# Patient Record
Sex: Female | Born: 1970 | Race: Black or African American | Hispanic: No | Marital: Married | Smoking: Former smoker
Health system: Southern US, Community
[De-identification: ages and names within clinical notes are randomized; demographics above are authoritative.]

## PROBLEM LIST (undated history)

## (undated) DIAGNOSIS — I10 Essential (primary) hypertension: Secondary | ICD-10-CM

## (undated) DIAGNOSIS — M545 Low back pain, unspecified: Secondary | ICD-10-CM

## (undated) DIAGNOSIS — F32A Depression, unspecified: Secondary | ICD-10-CM

## (undated) DIAGNOSIS — R51 Headache: Secondary | ICD-10-CM

## (undated) DIAGNOSIS — T7840XA Allergy, unspecified, initial encounter: Secondary | ICD-10-CM

## (undated) DIAGNOSIS — R87619 Unspecified abnormal cytological findings in specimens from cervix uteri: Secondary | ICD-10-CM

## (undated) DIAGNOSIS — K219 Gastro-esophageal reflux disease without esophagitis: Secondary | ICD-10-CM

## (undated) DIAGNOSIS — N390 Urinary tract infection, site not specified: Secondary | ICD-10-CM

## (undated) DIAGNOSIS — F329 Major depressive disorder, single episode, unspecified: Secondary | ICD-10-CM

## (undated) DIAGNOSIS — G8929 Other chronic pain: Secondary | ICD-10-CM

## (undated) DIAGNOSIS — R519 Headache, unspecified: Secondary | ICD-10-CM

## (undated) DIAGNOSIS — M1990 Unspecified osteoarthritis, unspecified site: Secondary | ICD-10-CM

## (undated) DIAGNOSIS — E119 Type 2 diabetes mellitus without complications: Secondary | ICD-10-CM

## (undated) HISTORY — DX: Gastro-esophageal reflux disease without esophagitis: K21.9

## (undated) HISTORY — DX: Type 2 diabetes mellitus without complications: E11.9

## (undated) HISTORY — PX: CERVICAL BIOPSY  W/ LOOP ELECTRODE EXCISION: SUR135

## (undated) HISTORY — DX: Major depressive disorder, single episode, unspecified: F32.9

## (undated) HISTORY — DX: Headache: R51

## (undated) HISTORY — PX: COLPOSCOPY: SHX161

## (undated) HISTORY — DX: Unspecified abnormal cytological findings in specimens from cervix uteri: R87.619

## (undated) HISTORY — DX: Urinary tract infection, site not specified: N39.0

## (undated) HISTORY — DX: Low back pain, unspecified: M54.50

## (undated) HISTORY — DX: Unspecified osteoarthritis, unspecified site: M19.90

## (undated) HISTORY — DX: Other chronic pain: G89.29

## (undated) HISTORY — DX: Low back pain: M54.5

## (undated) HISTORY — PX: TUBAL LIGATION: SHX77

## (undated) HISTORY — DX: Allergy, unspecified, initial encounter: T78.40XA

## (undated) HISTORY — DX: Depression, unspecified: F32.A

## (undated) HISTORY — DX: Headache, unspecified: R51.9

## (undated) HISTORY — DX: Essential (primary) hypertension: I10

---

## 1998-06-28 ENCOUNTER — Emergency Department (HOSPITAL_COMMUNITY): Admission: EM | Admit: 1998-06-28 | Discharge: 1998-06-29 | Payer: Self-pay | Admitting: Emergency Medicine

## 1999-04-19 ENCOUNTER — Other Ambulatory Visit: Admission: RE | Admit: 1999-04-19 | Discharge: 1999-04-19 | Payer: Self-pay | Admitting: Internal Medicine

## 2000-05-25 ENCOUNTER — Other Ambulatory Visit: Admission: RE | Admit: 2000-05-25 | Discharge: 2000-05-25 | Payer: Self-pay | Admitting: Obstetrics and Gynecology

## 2000-12-09 ENCOUNTER — Encounter: Payer: Self-pay | Admitting: Emergency Medicine

## 2000-12-09 ENCOUNTER — Emergency Department (HOSPITAL_COMMUNITY): Admission: EM | Admit: 2000-12-09 | Discharge: 2000-12-09 | Payer: Self-pay | Admitting: Emergency Medicine

## 2001-06-04 ENCOUNTER — Other Ambulatory Visit: Admission: RE | Admit: 2001-06-04 | Discharge: 2001-06-04 | Payer: Self-pay | Admitting: Obstetrics and Gynecology

## 2003-09-18 ENCOUNTER — Encounter: Admission: RE | Admit: 2003-09-18 | Discharge: 2003-09-18 | Payer: Self-pay | Admitting: Obstetrics and Gynecology

## 2004-03-16 ENCOUNTER — Ambulatory Visit: Payer: Self-pay | Admitting: Internal Medicine

## 2004-03-26 ENCOUNTER — Ambulatory Visit: Payer: Self-pay | Admitting: Internal Medicine

## 2004-05-31 ENCOUNTER — Other Ambulatory Visit: Admission: RE | Admit: 2004-05-31 | Discharge: 2004-05-31 | Payer: Self-pay | Admitting: Internal Medicine

## 2004-05-31 ENCOUNTER — Ambulatory Visit: Payer: Self-pay | Admitting: Internal Medicine

## 2004-09-13 ENCOUNTER — Ambulatory Visit: Payer: Self-pay | Admitting: Internal Medicine

## 2004-09-26 ENCOUNTER — Encounter: Admission: RE | Admit: 2004-09-26 | Discharge: 2004-09-26 | Payer: Self-pay | Admitting: Internal Medicine

## 2005-04-18 ENCOUNTER — Ambulatory Visit: Payer: Self-pay | Admitting: Internal Medicine

## 2005-09-08 ENCOUNTER — Ambulatory Visit: Payer: Self-pay | Admitting: Internal Medicine

## 2005-09-15 ENCOUNTER — Other Ambulatory Visit: Admission: RE | Admit: 2005-09-15 | Discharge: 2005-09-15 | Payer: Self-pay | Admitting: Internal Medicine

## 2005-09-15 ENCOUNTER — Encounter: Payer: Self-pay | Admitting: Internal Medicine

## 2005-09-15 ENCOUNTER — Ambulatory Visit: Payer: Self-pay | Admitting: Internal Medicine

## 2006-01-25 ENCOUNTER — Emergency Department (HOSPITAL_COMMUNITY): Admission: EM | Admit: 2006-01-25 | Discharge: 2006-01-25 | Payer: Self-pay | Admitting: Family Medicine

## 2006-09-06 DIAGNOSIS — J309 Allergic rhinitis, unspecified: Secondary | ICD-10-CM | POA: Insufficient documentation

## 2006-09-06 DIAGNOSIS — F329 Major depressive disorder, single episode, unspecified: Secondary | ICD-10-CM

## 2006-09-06 DIAGNOSIS — F32A Depression, unspecified: Secondary | ICD-10-CM | POA: Insufficient documentation

## 2006-09-06 DIAGNOSIS — K219 Gastro-esophageal reflux disease without esophagitis: Secondary | ICD-10-CM | POA: Insufficient documentation

## 2006-09-19 ENCOUNTER — Encounter: Payer: Self-pay | Admitting: Internal Medicine

## 2006-09-19 ENCOUNTER — Other Ambulatory Visit: Admission: RE | Admit: 2006-09-19 | Discharge: 2006-09-19 | Payer: Self-pay | Admitting: Internal Medicine

## 2006-09-19 ENCOUNTER — Ambulatory Visit: Payer: Self-pay | Admitting: Internal Medicine

## 2006-09-19 DIAGNOSIS — M1990 Unspecified osteoarthritis, unspecified site: Secondary | ICD-10-CM | POA: Insufficient documentation

## 2006-09-19 DIAGNOSIS — M545 Low back pain, unspecified: Secondary | ICD-10-CM | POA: Insufficient documentation

## 2006-09-19 DIAGNOSIS — R1032 Left lower quadrant pain: Secondary | ICD-10-CM | POA: Insufficient documentation

## 2006-09-19 LAB — CONVERTED CEMR LAB
ALT: 16 units/L (ref 0–35)
AST: 14 units/L (ref 0–37)
Albumin: 3.7 g/dL (ref 3.5–5.2)
Alkaline Phosphatase: 58 units/L (ref 39–117)
BUN: 8 mg/dL (ref 6–23)
Basophils Absolute: 0.1 10*3/uL (ref 0.0–0.1)
Basophils Relative: 0.6 % (ref 0.0–1.0)
Bilirubin, Direct: 0.1 mg/dL (ref 0.0–0.3)
CO2: 26 meq/L (ref 19–32)
Calcium: 9.4 mg/dL (ref 8.4–10.5)
Chloride: 105 meq/L (ref 96–112)
Cholesterol: 122 mg/dL (ref 0–200)
Creatinine, Ser: 0.9 mg/dL (ref 0.4–1.2)
Eosinophils Absolute: 0.2 10*3/uL (ref 0.0–0.6)
Eosinophils Relative: 2.6 % (ref 0.0–5.0)
GFR calc Af Amer: 90 mL/min
GFR calc non Af Amer: 74 mL/min
Glucose, Bld: 74 mg/dL (ref 70–99)
HCT: 44.2 % (ref 36.0–46.0)
HDL: 28.5 mg/dL — ABNORMAL LOW (ref >39.0)
Hemoglobin: 15.1 g/dL — ABNORMAL HIGH (ref 12.0–15.0)
LDL Cholesterol: 79 mg/dL (ref 0–99)
Lymphocytes Relative: 34.8 % (ref 12.0–46.0)
MCHC: 34 g/dL (ref 30.0–36.0)
MCV: 97.4 fL (ref 78.0–100.0)
Monocytes Absolute: 0.5 10*3/uL (ref 0.2–0.7)
Monocytes Relative: 5.8 % (ref 3.0–11.0)
Neutro Abs: 5.2 10*3/uL (ref 1.4–7.7)
Neutrophils Relative %: 56.2 % (ref 43.0–77.0)
Platelets: 249 10*3/uL (ref 150–400)
Potassium: 3.9 meq/L (ref 3.5–5.1)
RBC: 4.54 M/uL (ref 3.87–5.11)
RDW: 12.5 % (ref 11.5–14.6)
Sodium: 138 meq/L (ref 135–145)
TSH: 1.6 microintl units/mL (ref 0.35–5.50)
Total Bilirubin: 1.4 mg/dL — ABNORMAL HIGH (ref 0.3–1.2)
Total CHOL/HDL Ratio: 4.3
Total Protein: 6.7 g/dL (ref 6.0–8.3)
Triglycerides: 75 mg/dL (ref 0–149)
VLDL: 15 mg/dL (ref 0–40)
WBC: 9.2 10*3/uL (ref 4.5–10.5)
hCG, Beta Chain, Quant, S: <0.5 milliintl units/mL

## 2006-09-22 ENCOUNTER — Encounter: Admission: RE | Admit: 2006-09-22 | Discharge: 2006-09-22 | Payer: Self-pay | Admitting: Internal Medicine

## 2006-09-26 ENCOUNTER — Telehealth: Payer: Self-pay | Admitting: *Deleted

## 2006-11-17 ENCOUNTER — Telehealth: Payer: Self-pay | Admitting: Internal Medicine

## 2007-07-02 ENCOUNTER — Telehealth (INDEPENDENT_AMBULATORY_CARE_PROVIDER_SITE_OTHER): Payer: Self-pay | Admitting: *Deleted

## 2007-09-13 ENCOUNTER — Ambulatory Visit: Payer: Self-pay | Admitting: Internal Medicine

## 2007-09-13 LAB — CONVERTED CEMR LAB
ALT: 19 units/L (ref 0–35)
AST: 18 units/L (ref 0–37)
Albumin: 3.6 g/dL (ref 3.5–5.2)
Alkaline Phosphatase: 45 units/L (ref 39–117)
BUN: 8 mg/dL (ref 6–23)
Basophils Absolute: 0 10*3/uL (ref 0.0–0.1)
Basophils Relative: 0.4 % (ref 0.0–3.0)
Bilirubin Urine: NEGATIVE
Bilirubin, Direct: 0.1 mg/dL (ref 0.0–0.3)
Blood in Urine, dipstick: NEGATIVE
CO2: 26 meq/L (ref 19–32)
Calcium: 9.2 mg/dL (ref 8.4–10.5)
Chloride: 107 meq/L (ref 96–112)
Cholesterol: 123 mg/dL (ref 0–200)
Creatinine, Ser: 0.8 mg/dL (ref 0.4–1.2)
Eosinophils Absolute: 0.3 10*3/uL (ref 0.0–0.7)
Eosinophils Relative: 3.1 % (ref 0.0–5.0)
GFR calc Af Amer: 103 mL/min
GFR calc non Af Amer: 85 mL/min
Glucose, Bld: 87 mg/dL (ref 70–99)
Glucose, Urine, Semiquant: NEGATIVE
HCT: 42 % (ref 36.0–46.0)
HDL: 31 mg/dL — ABNORMAL LOW (ref >39.0)
Hemoglobin: 14.9 g/dL (ref 12.0–15.0)
Ketones, urine, test strip: NEGATIVE
LDL Cholesterol: 78 mg/dL (ref 0–99)
Lymphocytes Relative: 34.8 % (ref 12.0–46.0)
MCHC: 35.6 g/dL (ref 30.0–36.0)
MCV: 95.2 fL (ref 78.0–100.0)
Monocytes Absolute: 0.7 10*3/uL (ref 0.1–1.0)
Monocytes Relative: 6.5 % (ref 3.0–12.0)
Neutro Abs: 5.7 10*3/uL (ref 1.4–7.7)
Neutrophils Relative %: 55.2 % (ref 43.0–77.0)
Nitrite: POSITIVE
Platelets: 223 10*3/uL (ref 150–400)
Potassium: 4.4 meq/L (ref 3.5–5.1)
Protein, U semiquant: NEGATIVE
RBC: 4.41 M/uL (ref 3.87–5.11)
RDW: 13.1 % (ref 11.5–14.6)
Sodium: 139 meq/L (ref 135–145)
Specific Gravity, Urine: 1.025
TSH: 1.66 microintl units/mL (ref 0.35–5.50)
Total Bilirubin: 1 mg/dL (ref 0.3–1.2)
Total CHOL/HDL Ratio: 4
Total Protein: 6.2 g/dL (ref 6.0–8.3)
Triglycerides: 72 mg/dL (ref 0–149)
Urobilinogen, UA: 0.2
VLDL: 14 mg/dL (ref 0–40)
WBC: 10.2 10*3/uL (ref 4.5–10.5)
pH: 6

## 2007-09-20 ENCOUNTER — Ambulatory Visit: Payer: Self-pay | Admitting: Internal Medicine

## 2007-09-20 DIAGNOSIS — M722 Plantar fascial fibromatosis: Secondary | ICD-10-CM | POA: Insufficient documentation

## 2007-10-24 ENCOUNTER — Telehealth: Payer: Self-pay | Admitting: Internal Medicine

## 2007-10-30 ENCOUNTER — Telehealth: Payer: Self-pay | Admitting: Internal Medicine

## 2007-11-01 ENCOUNTER — Ambulatory Visit: Payer: Self-pay | Admitting: Internal Medicine

## 2007-11-19 ENCOUNTER — Encounter: Payer: Self-pay | Admitting: Internal Medicine

## 2008-02-04 ENCOUNTER — Telehealth: Payer: Self-pay | Admitting: Internal Medicine

## 2008-07-11 ENCOUNTER — Telehealth: Payer: Self-pay | Admitting: Internal Medicine

## 2008-07-11 DIAGNOSIS — N926 Irregular menstruation, unspecified: Secondary | ICD-10-CM | POA: Insufficient documentation

## 2008-08-11 ENCOUNTER — Ambulatory Visit: Payer: Self-pay | Admitting: Internal Medicine

## 2008-08-11 LAB — CONVERTED CEMR LAB
ALT: 20 units/L (ref 0–35)
AST: 21 units/L (ref 0–37)
Albumin: 3.7 g/dL (ref 3.5–5.2)
Alkaline Phosphatase: 64 units/L (ref 39–117)
BUN: 6 mg/dL (ref 6–23)
Basophils Absolute: 0 10*3/uL (ref 0.0–0.1)
Basophils Relative: 0.1 % (ref 0.0–3.0)
Bilirubin Urine: NEGATIVE
Bilirubin, Direct: 0.2 mg/dL (ref 0.0–0.3)
CO2: 27 meq/L (ref 19–32)
Calcium: 9.1 mg/dL (ref 8.4–10.5)
Chloride: 106 meq/L (ref 96–112)
Cholesterol: 130 mg/dL (ref 0–200)
Creatinine, Ser: 0.8 mg/dL (ref 0.4–1.2)
Eosinophils Absolute: 0.3 10*3/uL (ref 0.0–0.7)
Eosinophils Relative: 3.2 % (ref 0.0–5.0)
GFR calc non Af Amer: 102.19 mL/min (ref >60)
Glucose, Bld: 76 mg/dL (ref 70–99)
HCT: 41.9 % (ref 36.0–46.0)
HDL: 34.1 mg/dL — ABNORMAL LOW (ref >39.00)
Hemoglobin: 15 g/dL (ref 12.0–15.0)
Ketones, ur: NEGATIVE mg/dL
LDL Cholesterol: 87 mg/dL (ref 0–99)
Leukocytes, UA: NEGATIVE
Lymphocytes Relative: 32.5 % (ref 12.0–46.0)
Lymphs Abs: 3.2 10*3/uL (ref 0.7–4.0)
MCHC: 35.8 g/dL (ref 30.0–36.0)
MCV: 96.7 fL (ref 78.0–100.0)
Monocytes Absolute: 0.5 10*3/uL (ref 0.1–1.0)
Monocytes Relative: 5 % (ref 3.0–12.0)
Neutro Abs: 5.7 10*3/uL (ref 1.4–7.7)
Neutrophils Relative %: 59.2 % (ref 43.0–77.0)
Nitrite: POSITIVE
Platelets: 231 10*3/uL (ref 150.0–400.0)
Potassium: 4.1 meq/L (ref 3.5–5.1)
RBC: 4.33 M/uL (ref 3.87–5.11)
RDW: 12.7 % (ref 11.5–14.6)
Sodium: 141 meq/L (ref 135–145)
Specific Gravity, Urine: >=1.03 (ref 1.000–1.030)
TSH: 1.62 microintl units/mL (ref 0.35–5.50)
Total Bilirubin: 0.9 mg/dL (ref 0.3–1.2)
Total CHOL/HDL Ratio: 4
Total Protein: 6.5 g/dL (ref 6.0–8.3)
Triglycerides: 47 mg/dL (ref 0.0–149.0)
Urine Glucose: NEGATIVE mg/dL
Urobilinogen, UA: 0.2 (ref 0.0–1.0)
VLDL: 9.4 mg/dL (ref 0.0–40.0)
WBC: 9.7 10*3/uL (ref 4.5–10.5)
pH: 5.5 (ref 5.0–8.0)

## 2008-08-20 LAB — CONVERTED CEMR LAB: Pap Smear: NORMAL

## 2008-09-09 ENCOUNTER — Ambulatory Visit: Payer: Self-pay | Admitting: Internal Medicine

## 2008-09-09 DIAGNOSIS — B351 Tinea unguium: Secondary | ICD-10-CM | POA: Insufficient documentation

## 2008-09-10 ENCOUNTER — Emergency Department (HOSPITAL_COMMUNITY): Admission: EM | Admit: 2008-09-10 | Discharge: 2008-09-10 | Payer: Self-pay | Admitting: Emergency Medicine

## 2008-09-15 ENCOUNTER — Encounter: Admission: RE | Admit: 2008-09-15 | Discharge: 2008-09-15 | Payer: Self-pay | Admitting: Internal Medicine

## 2008-12-03 ENCOUNTER — Ambulatory Visit: Payer: Self-pay | Admitting: Internal Medicine

## 2009-06-24 ENCOUNTER — Telehealth: Payer: Self-pay | Admitting: Internal Medicine

## 2009-07-03 ENCOUNTER — Telehealth: Payer: Self-pay | Admitting: Internal Medicine

## 2009-08-06 ENCOUNTER — Emergency Department (HOSPITAL_COMMUNITY): Admission: EM | Admit: 2009-08-06 | Discharge: 2009-08-06 | Payer: Self-pay | Admitting: Family Medicine

## 2009-09-15 ENCOUNTER — Ambulatory Visit: Payer: Self-pay | Admitting: Internal Medicine

## 2009-09-15 LAB — CONVERTED CEMR LAB
ALT: 16 units/L (ref 0–35)
AST: 18 units/L (ref 0–37)
Albumin: 4 g/dL (ref 3.5–5.2)
Alkaline Phosphatase: 58 units/L (ref 39–117)
BUN: 10 mg/dL (ref 6–23)
Basophils Absolute: 0.1 10*3/uL (ref 0.0–0.1)
Basophils Relative: 0.5 % (ref 0.0–3.0)
Bilirubin Urine: NEGATIVE
Bilirubin, Direct: 0.2 mg/dL (ref 0.0–0.3)
Blood in Urine, dipstick: NEGATIVE
CO2: 27 meq/L (ref 19–32)
Calcium: 9.1 mg/dL (ref 8.4–10.5)
Chloride: 106 meq/L (ref 96–112)
Cholesterol: 141 mg/dL (ref 0–200)
Creatinine, Ser: 0.8 mg/dL (ref 0.4–1.2)
Eosinophils Absolute: 0.2 10*3/uL (ref 0.0–0.7)
Eosinophils Relative: 1.8 % (ref 0.0–5.0)
GFR calc non Af Amer: 96.07 mL/min (ref >60)
Glucose, Bld: 86 mg/dL (ref 70–99)
Glucose, Urine, Semiquant: NEGATIVE
HCT: 42.4 % (ref 36.0–46.0)
HDL: 36.4 mg/dL — ABNORMAL LOW (ref >39.00)
Hemoglobin: 14.8 g/dL (ref 12.0–15.0)
Ketones, urine, test strip: NEGATIVE
LDL Cholesterol: 89 mg/dL (ref 0–99)
Lymphocytes Relative: 31.1 % (ref 12.0–46.0)
Lymphs Abs: 3.8 10*3/uL (ref 0.7–4.0)
MCHC: 35 g/dL (ref 30.0–36.0)
MCV: 97.2 fL (ref 78.0–100.0)
Monocytes Absolute: 0.5 10*3/uL (ref 0.1–1.0)
Monocytes Relative: 4 % (ref 3.0–12.0)
Neutro Abs: 7.7 10*3/uL (ref 1.4–7.7)
Neutrophils Relative %: 62.6 % (ref 43.0–77.0)
Nitrite: NEGATIVE
Platelets: 236 10*3/uL (ref 150.0–400.0)
Potassium: 3.9 meq/L (ref 3.5–5.1)
Protein, U semiquant: NEGATIVE
RBC: 4.37 M/uL (ref 3.87–5.11)
RDW: 13.6 % (ref 11.5–14.6)
Sodium: 138 meq/L (ref 135–145)
Specific Gravity, Urine: >=1.03
TSH: 1.87 microintl units/mL (ref 0.35–5.50)
Total Bilirubin: 0.7 mg/dL (ref 0.3–1.2)
Total CHOL/HDL Ratio: 4
Total Protein: 6.6 g/dL (ref 6.0–8.3)
Triglycerides: 76 mg/dL (ref 0.0–149.0)
Urobilinogen, UA: 0.2
VLDL: 15.2 mg/dL (ref 0.0–40.0)
WBC Urine, dipstick: NEGATIVE
WBC: 12.3 10*3/uL — ABNORMAL HIGH (ref 4.5–10.5)
pH: 5.5

## 2009-09-23 ENCOUNTER — Ambulatory Visit: Payer: Self-pay | Admitting: Internal Medicine

## 2009-09-23 DIAGNOSIS — N920 Excessive and frequent menstruation with regular cycle: Secondary | ICD-10-CM | POA: Insufficient documentation

## 2009-10-01 ENCOUNTER — Encounter: Admission: RE | Admit: 2009-10-01 | Discharge: 2009-10-01 | Payer: Self-pay | Admitting: Internal Medicine

## 2009-10-01 LAB — HM MAMMOGRAPHY

## 2010-03-23 NOTE — Progress Notes (Signed)
----   Converted from flag ---- ---- 09/25/2006 6:31 PM, Stacie Glaze MD wrote: collapsed cyst on right ovary is source of pain ------------------------------ pt informed

## 2010-03-23 NOTE — Progress Notes (Signed)
Summary: question about ear  Phone Note Call from Patient   Caller: Patient Call For: Dr. Lovell Sheehan Summary of Call: Pt used a Qtip in her ear, and saw drops of blood yesterday.  No bleeding today, but is concerned if she needs to be seen. [phone] Initial call taken by: Lynann Beaver CMA,  February 04, 2008 10:14 AM  Follow-up for Phone Call        no qtips!!!!! place warm water and peroxide  ( 50/50 ) and let is bubble for about 20 seconds then shake out.... unles there is rhearing loss she does not need to be seen Follow-up by: Stacie Glaze MD,  February 04, 2008 1:44 PM  Additional Follow-up for Phone Call Additional follow up Details #1::        Pt. notifiied. Additional Follow-up by: Lynann Beaver CMA,  February 04, 2008 2:22 PM

## 2010-03-23 NOTE — Assessment & Plan Note (Signed)
Summary: sinus infection/dm   Vital Signs:  Patient Profile:   40 Years Old Female Height:     68 inches Weight:      253 pounds Temp:     98.5 degrees F oral Pulse rate:   60 / minute Pulse rhythm:   regular BP sitting:   110 / 80  (left arm) Cuff size:   large  Vitals Entered By: Kern Reap CMA (November 01, 2007 4:01 PM)                 Chief Complaint:  chest congestion.  History of Present Illness: 40 year old patient with a 7-day history of head and chest congestion.  She has had refractory cough that is nonproductive.  No real fever.  She has tried a number of OTC medications with only marginal benefit    Prior Medication List:  RHINOCORT AQUA 32 MCG/ACT  SUSP (BUDESONIDE (NASAL)) use as directed NEXIUM 40 MG  CPDR (ESOMEPRAZOLE MAGNESIUM) one daily PRILOSEC 20 MG  CPDR (OMEPRAZOLE) one by mouth daily   Current Allergies: No known allergies       Physical Exam  General:     overweight-appearing.  normal blood pressure Head:     Normocephalic and atraumatic without obvious abnormalities. No apparent alopecia or balding. Eyes:     No corneal or conjunctival inflammation noted. EOMI. Perrla. Funduscopic exam benign, without hemorrhages, exudates or papilledema. Vision grossly normal. Ears:     External ear exam shows no significant lesions or deformities.  Otoscopic examination reveals clear canals, tympanic membranes are intact bilaterally without bulging, retraction, inflammation or discharge. Hearing is grossly normal bilaterally. Mouth:     Oral mucosa and oropharynx without lesions or exudates.  Teeth in good repair. Neck:     No deformities, masses, or tenderness noted. Lungs:     Normal respiratory effort, chest expands symmetrically. Lungs are clear to auscultation, no crackles or wheezes. Heart:     Normal rate and regular rhythm. S1 and S2 normal without gallop, murmur, click, rub or other extra sounds.    Impression &  Recommendations:  Problem # 1:  URI (ICD-465.9) will treat symptomatically  Complete Medication List: 1)  Rhinocort Aqua 32 Mcg/act Susp (Budesonide (nasal)) .... Use as directed 2)  Nexium 40 Mg Cpdr (Esomeprazole magnesium) .... One daily 3)  Prilosec 20 Mg Cpdr (Omeprazole) .... One by mouth daily   Patient Instructions: 1)  Get plenty of rest, drink lots of clear liquids, and use Tylenol or Ibuprofen for fever and comfort. Return in 7-10 days if you're not better:sooner if you're feeling worse.   ]

## 2010-03-23 NOTE — Assessment & Plan Note (Signed)
Summary: cpx,pap/jls   Vital Signs:  Patient Profile:   40 Years Old Female Height:     68 inches Weight:      254 pounds Temp:     98.2 degrees F oral Pulse rate:   80 / minute Resp:     14 per minute BP sitting:   130 / 80  (left arm)  Vitals Entered By: Willy Eddy, LPN (September 20, 2007 3:18 PM)                 Chief Complaint:  cpx -was scheduled for pap but unable to do today -dt in2003 and colon in 2001.  History of Present Illness:  Follow-Up Visit      This is a 40 year old woman who presents for Follow-up visit.  cpx.  The patient denies chest pain, palpitations, dizziness, syncope, low blood sugar symptoms, high blood sugar symptoms, edema, SOB, DOE, PND, and orthopnea.  Since the last visit the patient notes a recent ED visit.  The patient reports taking meds as prescribed and monitoring BP.  When questioned about possible medication side effects, the patient notes cramping.         Review of Systems  The patient denies anorexia, fever, weight loss, weight gain, vision loss, decreased hearing, hoarseness, chest pain, syncope, dyspnea on exertion, peripheral edema, prolonged cough, headaches, hemoptysis, abdominal pain, melena, hematochezia, severe indigestion/heartburn, hematuria, incontinence, genital sores, muscle weakness, suspicious skin lesions, transient blindness, difficulty walking, depression, unusual weight change, abnormal bleeding, enlarged lymph nodes, angioedema, and breast masses.     Physical Exam  General:     alert.   Head:     normocephalic and atraumatic.   Eyes:     vision grossly intact, pupils equal, and pupils round.   Ears:     R ear normal and L ear normal.   Nose:     no external deformity.   Neck:     supple.   Lungs:     normal respiratory effort, normal breath sounds, no dullness, and no crackles.   Heart:     normal rate, regular rhythm, and no murmur.   Abdomen:     soft, normal bowel sounds, no distention, and  LLQ tenderness.   Rectal:     no external abnormalities.   Genitalia:     normal introitus, mucosa pink and moist, no vaginal or cervical lesions, no friaility or hemorrhage, and vaginal discharge.      Impression & Recommendations:  Problem # 1:  PHYSICAL EXAMINATION (ICD-V70.0)  Orders: EKG w/ Interpretation (93000)   Problem # 2:  LOW BACK PAIN (ICD-724.2) Discussed use of moist heat or ice, modified activities, medications, and stretching/strengthening exercises. Back care instructions given. To be seen in 2 weeks if no improvement; sooner if worsening of symptoms.   Problem # 3:  GERD (ICD-530.81)  Her updated medication list for this problem includes:    Nexium 40 Mg Cpdr (Esomeprazole magnesium) ..... One daily    Prilosec 20 Mg Cpdr (Omeprazole) ..... One by mouth daily  Diagnostics Reviewed:  Discussed lifestyle modifications, diet, antacids/medications, and preventive measures. Handout provided.   Problem # 4:  PLANTAR FASCIITIS (ICD-728.71) Discussed use of gel inserts, ice massage, and stretching exercises.   Complete Medication List: 1)  Rhinocort Aqua 32 Mcg/act Susp (Budesonide (nasal)) .... Use as directed 2)  Nexium 40 Mg Cpdr (Esomeprazole magnesium) .... One daily 3)  Prilosec 20 Mg Cpdr (Omeprazole) .... One by mouth daily  Patient Instructions: 1)  Please schedule a follow-up appointment in 2- 3 weeks.   Prescriptions: PRILOSEC 20 MG  CPDR (OMEPRAZOLE) one by mouth daily  #30 x 11   Entered and Authorized by:   Stacie Glaze MD   Signed by:   Stacie Glaze MD on 09/20/2007   Method used:   Electron[facility] sent to ...       [facility] [address] [phone]*       [address]       [place], [place]  [address]       Ph: [phone] or [phone]       Fax: [phone]   RxID:   [id number]  ]

## 2010-03-23 NOTE — Assessment & Plan Note (Signed)
Summary: cpx/pap/cjr   Vital Signs:  Patient profile:   40 year old female Height:      68 inches Weight:      244 pounds BMI:     37.23 Temp:     98.2 degrees F oral Pulse rate:   80 / minute Resp:     14 per minute BP sitting:   140 / 84  (left arm)  Vitals Entered By: Willy Eddy, LPN (September 23, 2009 3:14 PM) CC: cpx- started menses today will wait for pap Is Patient Diabetic? No   CC:  cpx- started menses today will wait for pap.  History of Present Illness: pain in back that started two days ago and has become acute today onset seems to paralell her periods she had very irredular menses she has not fever of chill and has noted no discharges   The pt was asked about all immunizations, health maint. services that are appropriate to their age and was given guidance on diet exercize  and weight management   Preventive Screening-Counseling & Management  Alcohol-Tobacco     Smoking Status: never  Problems Prior to Update: 1)  Dermatophytosis of Nail  (ICD-110.1) 2)  Acute Cystitis  (ICD-595.0) 3)  Irregular Menstrual Cycle  (ICD-626.4) 4)  Uri  (ICD-465.9) 5)  Plantar Fasciitis  (ICD-728.71) 6)  Physical Examination  (ICD-V70.0) 7)  Abdominal Pain, Left Lower Quadrant  (ICD-789.04) 8)  Preventive Health Care  (ICD-V70.0) 9)  Family History Diabetes 1st Degree Relative  (ICD-V18.0) 10)  Low Back Pain  (ICD-724.2) 11)  Osteoarthritis  (ICD-715.90) 12)  Gerd  (ICD-530.81) 13)  Depression  (ICD-311) 14)  Allergic Rhinitis  (ICD-477.9)  Current Problems (verified): 1)  Dermatophytosis of Nail  (ICD-110.1) 2)  Acute Cystitis  (ICD-595.0) 3)  Irregular Menstrual Cycle  (ICD-626.4) 4)  Uri  (ICD-465.9) 5)  Plantar Fasciitis  (ICD-728.71) 6)  Physical Examination  (ICD-V70.0) 7)  Abdominal Pain, Left Lower Quadrant  (ICD-789.04) 8)  Preventive Health Care  (ICD-V70.0) 9)  Family History Diabetes 1st Degree Relative  (ICD-V18.0) 10)  Low Back Pain   (ICD-724.2) 11)  Osteoarthritis  (ICD-715.90) 12)  Gerd  (ICD-530.81) 13)  Depression  (ICD-311) 14)  Allergic Rhinitis  (ICD-477.9)  Medications Prior to Update: 1)  Prilosec 20 Mg  Cpdr (Omeprazole) .... One By Mouth Daily 2)  Ciprofloxacin Hcl 500 Mg Tabs (Ciprofloxacin Hcl) .... One By Mouth Two Times A Day 3)  Medrol (Pak) 4 Mg Tabs (Methylprednisolone) .... Take As Directed 4)  Flexeril 10 Mg Tabs (Cyclobenzaprine Hcl) .Marland Kitchen.. 1 Three Times A Day  Current Medications (verified): 1)  Prilosec 20 Mg  Cpdr (Omeprazole) .... One By Mouth Daily 2)  Vimovo 500-20 Mg Tbec (Naproxen-Esomeprazole) .... One By Mouth Two Times A Day  Allergies (verified): No Known Drug Allergies  Past History:  Family History: Last updated: 09/19/2006 mother Family History Diabetes 1st degree relative Family History High cholesterol Family History Hypertension Father ( as well )  Social History: Last updated: 09/19/2006 Occupation: quality specialist Divorced Never Smoked  Risk Factors: Smoking Status: never (09/23/2009)  Past medical, surgical, family and social histories (including risk factors) reviewed, and no changes noted (except as noted below).  Past Medical History: Reviewed history from 09/19/2006 and no changes required. Allergic rhinitis Depression GERD Osteoarthritis Low back pain due to disc dx  Past Surgical History: Reviewed history from 09/06/2006 and no changes required. Tubal ligation  Family History: Reviewed history from 09/19/2006 and no  changes required. mother Family History Diabetes 1st degree relative Family History High cholesterol Family History Hypertension Father ( as well )  Social History: Reviewed history from 09/19/2006 and no changes required. Occupation: quality specialist Divorced Never Smoked  Review of Systems       The patient complains of abdominal pain, abnormal bleeding, and breast masses.  The patient denies anorexia, fever,  weight loss, weight gain, vision loss, decreased hearing, hoarseness, chest pain, syncope, dyspnea on exertion, peripheral edema, prolonged cough, headaches, hemoptysis, melena, hematochezia, severe indigestion/heartburn, hematuria, incontinence, genital sores, muscle weakness, suspicious skin lesions, transient blindness, difficulty walking, depression, unusual weight change, enlarged lymph nodes, and angioedema.    Physical Exam  General:  overweight-appearing.  normal blood pressure Head:  Normocephalic and atraumatic without obvious abnormalities. No apparent alopecia or balding. Eyes:  pupils equal and pupils round.   Ears:  R ear normal and L ear normal.   Nose:  no external deformity and no nasal discharge.   Neck:  No deformities, masses, or tenderness noted. Breasts:  no masses, no abnormal thickening, asymetrical, and L breast tender.   Lungs:  normal respiratory effort and no wheezes.   Heart:  normal rate and regular rhythm.   Abdomen:  soft and normal bowel sounds.   Msk:  No deformity or scoliosis noted of thoracic or lumbar spine.   Pulses:  R and L carotid,radial,femoral,dorsalis pedis and posterior tibial pulses are full and equal bilaterally Extremities:  No clubbing, cyanosis, edema, or deformity noted with normal full range of motion of all joints.   Neurologic:  No cranial nerve deficits noted. Station and gait are normal. Plantar reflexes are down-going bilaterally. DTRs are symmetrical throughout. Sensory, motor and coordinative functions appear intact. Cervical Nodes:  No lymphadenopathy noted Axillary Nodes:  No palpable lymphadenopathy Inguinal Nodes:  No significant adenopathy Psych:  Cognition and judgment appear intact. Alert and cooperative with normal attention span and concentration. No apparent delusions, illusions, hallucinations   Impression & Recommendations:  Problem # 1:  IRREGULAR MENSTRUAL CYCLE (ICD-626.4) Assessment Deteriorated  the pt has  associated back pain and elevation of white count she is mentruating today with heavy flow she noted that the severe back pain is associated with the menstrual cycle  Discussed medication use.  will give breif course of antibiotic due to heavy flow today an exam is impossible  Problem # 2:  PHYSICAL EXAMINATION (ICD-V70.0) Assessment: Unchanged The pt was asked about all immunizations, health maint. services that are appropriate to their age and was given guidance on diet exercize  and weight management  Mammogram: normal (11/20/2008) Pap smear: normal (08/20/2008) Td Booster: Td (02/21/2001)   Flu Vax: Fluvax 3+ (12/03/2008)   Chol: 141 (09/15/2009)   HDL: 36.40 (09/15/2009)   LDL: 89 (09/15/2009)   TG: 76.0 (09/15/2009) TSH: 1.87 (09/15/2009)   Next mammogram due:: 11/2009 (09/23/2009)  Discussed using sunscreen, use of alcohol, drug use, self breast exam, routine dental care, routine eye care, schedule for GYN exam, routine physical exam, seat belts, multiple vitamins, osteoporosis prevention, adequate calcium intake in diet, recommendations for immunizations, mammograms and Pap smears.  Discussed exercise and checking cholesterol.  Discussed gun safety, safe sex, and contraception.  Problem # 3:  MENORRHAGIA (ICD-626.2) Assessment: Deteriorated  vimovo one by mouth two times a day moniter ack pain is raltionship to menses if pain is paralell may consider referral for BCP  Discussed medication use.   Complete Medication List: 1)  Prilosec 20 Mg Cpdr (Omeprazole) .Marland KitchenMarland KitchenMarland Kitchen  One by mouth daily 2)  Vimovo 500-20 Mg Tbec (Naproxen-esomeprazole) .... One by mouth two times a day 3)  Ciprofloxacin Hcl 500 Mg Tabs (Ciprofloxacin hcl) .... One by mouth two times a day for 10 days  Patient Instructions: 1)  Please schedule a follow-up appointment in 6-7 weeks. Prescriptions: CIPROFLOXACIN HCL 500 MG TABS (CIPROFLOXACIN HCL) one by mouth two times a day for 10 days  #20 x 0   Entered and  Authorized by:   Stacie Glaze MD   Signed by:   Stacie Glaze MD on 09/23/2009   Method used:   [facility]        [facility] [address] [phone]* (retail)       [address]       [place], [place]  [address]       Ph: [phone] or [phone]       Fax: [phone]   RxID:   [id number]    Preventive Care Screening  Mammogram:    Date:  11/20/2008    Next Due:  11/2009    Results:  normal

## 2010-03-23 NOTE — Assessment & Plan Note (Signed)
Summary: FLU SHOT//LH  Nurse Visit   Review of Systems       Flu Vaccine Consent Questions     Do you have a history of severe allergic reactions to this vaccine? no    Any prior history of allergic reactions to egg and/or gelatin? no    Do you have a sensitivity to the preservative Thimersol? no    Do you have a past history of Guillan-Barre Syndrome? no    Do you currently have an acute febrile illness? no    Have you ever had a severe reaction to latex? no    Vaccine information given and explained to patient? yes    Are you currently pregnant? no    Lot Number:[id number]   Exp Date:08/20/2009   Site Given  Left Deltoid IM    Allergies: No Known Drug Allergies  Orders Added: 1)  Admin 1st Vaccine [90471] 2)  Flu Vaccine 69yrs + [84696]

## 2010-03-23 NOTE — Progress Notes (Signed)
Summary: Pt req work in appt with Dr.Liddie Chichester or med for sinus and ear inf  Phone Note Call from Patient Call back at [phone] cell   Caller: Patient Reason for Call: Acute Illness, Referral Summary of Call: Pt has ?sinus inf and ear inf. Pt req to be work in to see Dr Lovell Sheehan today or to [facility] [place].    Initial call taken by: Lucy Antigua,  Jul 03, 2009 10:10 AM  Follow-up for Phone Call        ov given for today at 4:`15 Follow-up by: Willy Eddy, LPN,  Jul 03, 2009 12:03 PM

## 2010-03-23 NOTE — Assessment & Plan Note (Signed)
Summary: CPX/MHF rsc could not get off work/mhf   Vital Signs:  Patient profile:   40 year old female Height:      68 inches Weight:      240 pounds BMI:     36.62 Temp:     98.2 degrees F oral Pulse rate:   72 / minute Resp:     14 per minute BP sitting:   112 / 80  (left arm)  Vitals Entered By: Willy Eddy, LPN (September 09, 2008 8:38 AM)  CC:  cpx.  History of Present Illness: The pt was asked about all immunizations, health maint. services that are appropriate to their age and was given guidance on diet exercize  and weight management   ADDITON PROBLEM UNRELATED TO THE WELNESS EXAM AS SCHEDULED FUNGAL NAILS RAISED, PAINFULL INCREASED INVOLVMENT OVER LAST 6 MONTHS    Problems Prior to Update: 1)  Irregular Menstrual Cycle  (ICD-626.4) 2)  Uri  (ICD-465.9) 3)  Plantar Fasciitis  (ICD-728.71) 4)  Physical Examination  (ICD-V70.0) 5)  Abdominal Pain, Left Lower Quadrant  (ICD-789.04) 6)  Preventive Health Care  (ICD-V70.0) 7)  Family History Diabetes 1st Degree Relative  (ICD-V18.0) 8)  Low Back Pain  (ICD-724.2) 9)  Osteoarthritis  (ICD-715.90) 10)  Gerd  (ICD-530.81) 11)  Depression  (ICD-311) 12)  Allergic Rhinitis  (ICD-477.9)  Medications Prior to Update: 1)  Rhinocort Aqua 32 Mcg/act  Susp (Budesonide (Nasal)) .... Use As Directed 2)  Nexium 40 Mg  Cpdr (Esomeprazole Magnesium) .... One Daily 3)  Prilosec 20 Mg  Cpdr (Omeprazole) .... One By Mouth Daily  Current Medications (verified): 1)  Prilosec 20 Mg  Cpdr (Omeprazole) .... One By Mouth Daily 2)  Ciprofloxacin Hcl 500 Mg Tabs (Ciprofloxacin Hcl) .... One By Mouth Two Times A Day 3)  Terbinafine Hcl 250 Mg Tabs (Terbinafine Hcl) .... One By Mouth Daily For 90 Days  Allergies (verified): No Known Drug Allergies  Past History:  Family History: Last updated: 09/19/2006 mother Family History Diabetes 1st degree relative Family History High cholesterol Family History Hypertension Father ( as well  )  Social History: Last updated: 09/19/2006 Occupation: quality specialist Divorced Never Smoked  Risk Factors: Smoking Status: never (09/19/2006)  Past medical, surgical, family and social histories (including risk factors) reviewed, and no changes noted (except as noted below).  Past Medical History: Reviewed history from 09/19/2006 and no changes required. Allergic rhinitis Depression GERD Osteoarthritis Low back pain due to disc dx  Past Surgical History: Reviewed history from 09/06/2006 and no changes required. Tubal ligation  Family History: Reviewed history from 09/19/2006 and no changes required. mother Family History Diabetes 1st degree relative Family History High cholesterol Family History Hypertension Father ( as well )  Social History: Reviewed history from 09/19/2006 and no changes required. Occupation: Special educational needs teacher Divorced Never Smoked  Review of Systems  The patient denies anorexia, fever, weight loss, weight gain, vision loss, decreased hearing, hoarseness, chest pain, syncope, dyspnea on exertion, peripheral edema, prolonged cough, headaches, hemoptysis, abdominal pain, melena, hematochezia, severe indigestion/heartburn, hematuria, incontinence, genital sores, muscle weakness, suspicious skin lesions, transient blindness, difficulty walking, depression, unusual weight change, abnormal bleeding, enlarged lymph nodes, angioedema, and breast masses.    Physical Exam  General:  overweight-appearing.  normal blood pressure Head:  Normocephalic and atraumatic without obvious abnormalities. No apparent alopecia or balding. Eyes:  No corneal or conjunctival inflammation noted. EOMI. Perrla. Funduscopic exam benign, without hemorrhages, exudates or papilledema. Vision grossly normal. Ears:  R  ear normal and L ear normal.   Nose:  no external deformity.   Mouth:  Oral mucosa and oropharynx without lesions or exudates.  Teeth in good repair. Neck:  No  deformities, masses, or tenderness noted. Chest Wall:  no deformities and no tenderness.   Breasts:  increased breast cystic changes Lungs:  Normal respiratory effort, chest expands symmetrically. Lungs are clear to auscultation, no crackles or wheezes. Heart:  Normal rate and regular rhythm. S1 and S2 normal without gallop, murmur, click, rub or other extra sounds. Abdomen:  soft, normal bowel sounds, no distention, and LLQ tenderness.   Msk:  normal ROM, no joint tenderness, and no joint swelling.   Pulses:  R radial normal and L radial normal.   Extremities:  No clubbing, cyanosis, edema, or deformity noted with normal full range of motion of all joints.   Neurologic:  No cranial nerve deficits noted. Station and gait are normal. Plantar reflexes are down-going bilaterally. DTRs are symmetrical throughout. Sensory, motor and coordinative functions appear intact. Skin:  turgor normal, color normal, and acneiform lesions. on back   HAS THICKED NAILES koh POSITIVE ON RIGHT FOOT 3,4 NAILS Cervical Nodes:  no anterior cervical adenopathy and no posterior cervical adenopathy.   Axillary Nodes:  no R axillary adenopathy and no L axillary adenopathy.   Inguinal Nodes:  no R inguinal adenopathy and no L inguinal adenopathy.   Psych:  Oriented X3, memory intact for recent and remote, normally interactive, and good eye contact.     Impression & Recommendations:  Problem # 1:  PHYSICAL EXAMINATION (ICD-V70.0) The pt was asked about all immunizations, health maint. services that are appropriate to their age and was given guidance on diet exercize  and weight management  has lost 12 pounds through diet  Pap smear: normal (08/20/2008) Td Booster: Td (02/21/2001)   Flu Vax: Fluvax 3+ (11/19/2007)   Chol: 130 (08/11/2008)   HDL: 34.10 (08/11/2008)   LDL: 87 (08/11/2008)   TG: 47.0 (08/11/2008) TSH: 1.62 (08/11/2008)    Discussed using sunscreen, use of alcohol, drug use, self breast exam, routine  dental care, routine eye care, schedule for GYN exam, routine physical exam, seat belts, multiple vitamins, osteoporosis prevention, adequate calcium intake in diet, recommendations for immunizations, mammograms and Pap smears.  Discussed exercise and checking cholesterol.  Discussed gun safety, safe sex, and contraception.  Problem # 2:  ACUTE CYSTITIS (ICD-595.0)  Her updated medication list for this problem includes:    Ciprofloxacin Hcl 500 Mg Tabs (Ciprofloxacin hcl) ..... One by mouth two times a day  Problem # 3:  DERMATOPHYTOSIS OF NAIL (ICD-110.1)  discussioio of theraputic options  Discussed nail care and medication treatment options.   Her updated medication list for this problem includes:    Terbinafine Hcl 250 Mg Tabs (Terbinafine hcl) ..... One by mouth daily for 90 days WILL MONITER LFTS IN 60 DAYS  Complete Medication List: 1)  Prilosec 20 Mg Cpdr (Omeprazole) .... One by mouth daily 2)  Ciprofloxacin Hcl 500 Mg Tabs (Ciprofloxacin hcl) .... One by mouth two times a day 3)  Terbinafine Hcl 250 Mg Tabs (Terbinafine hcl) .... One by mouth daily for 90 days  Patient Instructions: 1)  Please schedule a follow-up appointment in 2 months. 2)  Hepatic Panel prior to visit, ICD-9: 995.20 Prescriptions: TERBINAFINE HCL 250 MG TABS (TERBINAFINE HCL) one by mouth daily for 90 days  #900 x 0   Entered and Authorized by:   Stacie Glaze MD  Signed by:   Stacie Glaze MD on 09/09/2008   Method used:   [facility]        [facility] [id number]ail[facility]       [address]       [place], [place]  [address]       Ph: [phone] or [phone]       Fax: [phone]   RxID:   [id number] CIPROFLOXACIN HCL 500 MG TABS (CIPROFLOXACIN HCL) one by mouth two times a day  #10 x 0   Entered and Authorized by:   Stacie Glaze MD   Signed by:   Stacie Glaze MD on 09/09/2008   Method used:   [facility]        [facility] [id number]        [address]       [place], [place]  [address]       Ph: [phone] or [phone]       Fax: [phone]   RxID:   [id number]    Preventive Care Screening  Pap Smear:    Date:  08/20/2008    Next Due:  08/2009    Results:  normal

## 2010-03-23 NOTE — Progress Notes (Signed)
Summary: back pain   LMTCB 06/24/2009  Phone Note Call from Patient   Caller: Patient  voice mail Call For: Stacie Glaze MD Reason for Call: Acute Illness Summary of Call: Pt is complaining of low back pain. LMTCB with more details. Lower back pain x 2-3 weeks.  No UTI symptoms.  Had back problems years ago and diagnosed as disc disease.  Feels the same and has left leg pain. [facility]  Initial call taken by: Lynann Beaver CMA,  Jun 24, 2009 11:14 AM  Follow-up for Phone Call        per dr Hervey Ard- m ay have medrol 4 mg dose pack and flexeril 10 1 three times a day as needed #30 with 0 refills- med sent in amd pt notified Follow-up by: Willy Eddy, LPN,  Jun 25, 5407 5:26 PM    New/Updated Medications: MEDROL (PAK) 4 MG TABS (METHYLPREDNISOLONE) take as directed FLEXERIL 10 MG TABS (CYCLOBENZAPRINE HCL) 1 three times a day Prescriptions: FLEXERIL 10 MG TABS (CYCLOBENZAPRINE HCL) 1 three times a day  #30 x 0   Entered by:   Willy Eddy, LPN   Authorized by:   Stacie Glaze MD   Signed by:   Willy Eddy, LPN on 81/19/1478   Method used:   [facility]        [facility] ([facility]ail[facility]       [address] [address]       [place], [place]  [address]       Ph: [phone] or [phone]       Fax: [phone]   RxID:   [id number] MEDROL (PAK) 4 MG TABS (METHYLPREDNISOLONE) take as directed  #1 x 0   Entered by:   Willy Eddy, LPN   Authorized by:   Stacie Glaze MD   Signed by:   Willy Eddy, LPN on 87/56/4332   Method used:   [facility]        [facility] [facility]retail)       [address]       [place], [place]  [address]       Ph: [phone] or [phone]       Fax: [phone]   RxID:   [id number]

## 2010-03-23 NOTE — Progress Notes (Signed)
Summary: Phone note  Phone Note Call from Patient   Summary of Call: Patient complaining that she has had her cycle three times since April. Patient wants to know if she need to be seen or is there something you can do. Patient can be reached at [phone]. Initial call taken by: Darra Lis RMA,  Jul 11, 2008 9:38 AM  Follow-up for Phone Call        to gyn per dr Lovell Sheehan- pt informed Follow-up by: Willy Eddy, LPN,  Jul 11, 2008 9:58 AM  New Problems: IRREGULAR MENSTRUAL CYCLE (ICD-626.4)   New Problems: IRREGULAR MENSTRUAL CYCLE (ICD-626.4)

## 2010-03-23 NOTE — Progress Notes (Signed)
Summary: ORTHO REFERRAL or OV?  Phone Note Call from Patient Call back at [phone]   Caller: PT VM TRIAGE Call For: JENKINS Summary of Call: WENT TO URGENT CARE ON FRIDAY ABOUT HER KNEE.  THEY TOOK SOME FLUID.  SHE WANTS JENKINS TO RECOMMENT AN ORTHOPEDIC DOCTOR FOR HER KNEE.   Initial call taken by: Roselle Locus,  Jul 02, 2007 11:00 AM  Follow-up for Phone Call        [facility], requesting additional information.  Last OV 7/08 Sid Falcon LPN  Jul 02, 2007 11:45 AM   Additional Follow-up for Phone Call Additional follow up Details #1::         Mon 5/4 pt was mowing her lawn, no injury.  Tuesday morning pt began to experience stiffness and swelling to right knee.  She began using ice alternating with heat and elevation when possible.  Sx continued, so she went to Urgent Care this weekend, X-rays neg, provider aspirated fluid from knee and sending to lab.  Knee is feeling better, pt on Hydrocodone prn and Indomethacin two times a day#60. [facility] Additional Follow-up by: Sid Falcon LPN,  Jul 02, 2007 12:05 PM    Additional Follow-up for Phone Call Additional follow up Details #2::    ortho referral Follow-up by: Stacie Glaze MD,  Jul 03, 2007 8:14 AM  Additional Follow-up for Phone Call Additional follow up Details #3:: Details for Additional Follow-up Action Taken: Spoke with patient/she wants to schedule her own appt. I gave her [facility] Fellers Aluisio([facility]) as a recommendation. Additional Follow-up by: Florentina Addison,  Jul 04, 2007 12:09 PM

## 2010-03-23 NOTE — Assessment & Plan Note (Signed)
Summary: CPX W/PAP/CCM   Vital Signs:  Patient Profile:   40 Years Old Female Height:     68 inches Weight:      230 pounds Temp:     98.1 degrees F oral Pulse rate:   76 / minute Resp:     12 per minute BP sitting:   120 / 80  (left arm)  Vitals Entered By: Willy Eddy, LPN (September 19, 2006 8:33 AM)               Chief Complaint:  cpx/fasting for labs this am/pap today and breast exam.  History of Present Illness: Pt presents for CPX fasting because labs were not scheduled Problem list review    Past Medical History:    Allergic rhinitis    Depression    GERD    Osteoarthritis    Low back pain due to disc dx   Family History:    mother    Family History Diabetes 1st degree relative    Family History High cholesterol    Family History Hypertension    Father ( as well )  Social History:    Occupation: Special educational needs teacher    Divorced    Never Smoked   Risk Factors:  Tobacco use:  never   Review of Systems       The patient complains of abdominal pain.  The patient denies anorexia, fever, weight loss, vision loss, decreased hearing, hoarseness, chest pain, syncope, dyspnea on exhertion, peripheral edema, and prolonged cough.         Pt complains of constipation, and gets post pradial cramping. No diarrhea. Gas. Pt gets cramps on left side of pelvis rated 8/10. Lasts during cycle. Cycles vary. 5 days with irregular intervals. Mother had TAH at 49.   Physical Exam  General:     alert.   Head:     normocephalic and atraumatic.   Eyes:     vision grossly intact, pupils equal, and pupils round.   Ears:     R ear normal and L ear normal.   Nose:     no external deformity.   Mouth:     good dentition and pharynx pink and moist.   Neck:     supple.   Chest Wall:     no deformities and no tenderness.   Breasts:     skin/areolae normal.   Lungs:     normal respiratory effort, normal breath sounds, no dullness, and no crackles.   Heart:  normal rate, regular rhythm, and no murmur.   Abdomen:     soft, normal bowel sounds, no distention, and LLQ tenderness.   Rectal:     no external abnormalities.   Genitalia:     normal introitus, mucosa pink and moist, no vaginal or cervical lesions, no friaility or hemorrhage, and vaginal discharge.   Msk:     normal ROM, no joint tenderness, and no joint swelling.   Pulses:     R radial normal and L radial normal.   Extremities:     No clubbing, cyanosis, edema, or deformity noted with normal full range of motion of all joints.   Neurologic:     No cranial nerve deficits noted. Station and gait are normal. Plantar reflexes are down-going bilaterally. DTRs are symmetrical throughout. Sensory, motor and coordinative functions appear intact. Skin:     turgor normal, color normal, and acneiform lesions. on back Cervical Nodes:     no anterior cervical adenopathy  and no posterior cervical adenopathy.   Axillary Nodes:     no R axillary adenopathy and no L axillary adenopathy.   Inguinal Nodes:     no R inguinal adenopathy and no L inguinal adenopathy.   Psych:     Oriented X3, memory intact for recent and remote, normally interactive, and good eye contact.      Impression & Recommendations:  Problem # 1:  PREVENTIVE HEALTH CARE (ICD-V70.0) Pt present for CPx labs are pending. Immunzations and screening are up to day. Mammogram needed Orders: TLB-BMP (Basic Metabolic Panel-BMET) (80048-METABOL) TLB-CBC Platelet - w/Differential (85025-CBCD) TLB-Hepatic/Liver Function Pnl (80076-HEPATIC) TLB-TSH (Thyroid Stimulating Hormone) (84443-TSH) TLB-Lipid Panel (80061-LIPID)   Problem # 2:  ABDOMINAL PAIN, LEFT LOWER QUADRANT (ICD-789.04) I suspect ovarian dysfunction with menstrual irregularities. Pelvic US to r/o mass Pregnavy test to r/o ectopic. Begin BCP post tests f/u 2 months. Orders: TLB-Preg Serum Quant (B-hCG) (84703-HCG-QN) Radiology Referral (Radiology)   Problem # 3:   GERD (ICD-530.81) need to take daily. Should help constipation and PP cramping. Her updated medication list for this problem includes:    Nexium 40 Mg Cpdr (Esomeprazole magnesium) ..... One daily  Diagnostics Reviewed:  Discussed lifestyle modifications, diet, antacids/medications, and preventive measures. Handout provided.   Problem # 4:  FAMILY HISTORY DIABETES 1ST DEGREE RELATIVE (ICD-V18.0) screening is appropriate with fasting labs and A1C  Medications Added to Medication List This Visit: 1)  Nexium 40 Mg Cpdr (Esomeprazole magnesium) .... One daily 2)  Lybrel 90-20 Mcg Tabs (Levonorgestrel-ethinyl estrad) .... 28 day pk take as directed   Patient Instructions: 1)  You are scheduled for labs today and we will call any abnormals. 2)  You will be called about a pelvic US to examine the LLQ. 3)  Please schedule an appointment with your primary doctor in : 10 weeks. 4)  WE DID CHECK A PREGANCY TEST TODAY BEFORE YOU BEGIN THE lYBREL 5)  Read the brouchure before starting and start after your next period.   Prescriptions: NEXIUM 40 MG  CPDR (ESOMEPRAZOLE MAGNESIUM) one daily  #30 x 11   Entered and Authorized by:   Stacie Glaze MD   Signed by:   Stacie Glaze MD on 09/19/2006   Method used:   Electronically sent to ...       [facility]       [address]       [place], [place]  [address]       Ph: [phone]       Fax: [phone]   RxID:   [id number] LYBREL 90-20 MCG  TABS (LEVONORGESTREL-ETHINYL ESTRAD) 28 day pk take as directed  #one pk x 11   Entered and Authorized by:   Stacie Glaze MD   Signed by:   Stacie Glaze MD on 09/19/2006   Method used:   Historical   RxID:   [id number]       Appended Document: Orders Update    Clinical Lists Changes  Orders: Added new Service order of Venipuncture [phone]) - Signed

## 2010-03-23 NOTE — Progress Notes (Signed)
Summary: viral complaints?  Phone Note Call from Patient   Caller: Patient Call For: Dr. Lovell Sheehan Reason for Call: Lab or Test Results Summary of Call: Pt is calling complaining of low grade fever, and aching all over with sinus congetion x 3-4 days.  Call Rx in or see pt? [phone] Initial call taken by: [facility] CMA,  October 24, 2007 10:02 AM  Follow-up for Phone Call        will use mucinex and saline nasal spary and call back tomorrow if not better Follow-up by: Willy Eddy, LPN,  October 24, 2007 11:04 AM

## 2010-03-23 NOTE — Progress Notes (Signed)
Summary: rx req  Phone Note Call from Patient Call back at Home Phone [phone]   Caller: Patient Call For: Lovell Sheehan Complaint: Headache Summary of Call: pt C/O of a cold and has sinuses, also has headaches on the right side for the las 24-hours. need to know if she need to come in or to have something called in. pt would like a call back   Initial call taken by: Shan Levans,  November 17, 2006 11:25 AM  Follow-up for Phone Call        zpack and allerX dose pack Follow-up by: Stacie Glaze MD,  November 17, 2006 2:08 PM  Additional Follow-up for Phone Call Additional follow up Details #1::        [facility]  Done.   Additional Follow-up by: Rudy Jew, RN,  November 17, 2006 3:05 PM

## 2010-03-23 NOTE — Progress Notes (Signed)
----   Converted from flag ---- ---- 09/25/2006 6:30 PM, Stacie Glaze MD wrote: may call in diflucan 100mg  one by mouth daily for 7 days path neg. ------------------------------ pt called and ned sent electronically

## 2010-03-23 NOTE — Assessment & Plan Note (Signed)
Summary: 3 wk follow up//db   Vital Signs:  Patient Profile:   40 Years Old Female Height:     68 inches Weight:      252 pounds Temp:     98.2 degrees F oral Pulse rate:   72 / minute Resp:     14 per minute BP sitting:   126 / 80  (left arm)  Vitals Entered By: Willy Eddy, LPN (November 19, 2007 10:24 AM)                 Chief Complaint:  pap.    Current Allergies: No known allergies         Impression & Recommendations:  Problem # 1:  Preventive Health Care (ICD-V70.0) Flu Vaccine Consent Questions     Do you have a history of severe allergic reactions to this vaccine? no    Any prior history of allergic reactions to egg and/or gelatin? no    Do you have a sensitivity to the preservative Thimersol? no    Do you have a past history of Guillan-Barre Syndrome? no    Do you currently have an acute febrile illness? no    Have you ever had a severe reaction to latex? no    Vaccine information given and explained to patient? yes    Are you currently pregnant? no    Lot Number:[id number]   Site Given  Left Deltoid IM   Complete Medication List: 1)  Rhinocort Aqua 32 Mcg/act Susp (Budesonide (nasal)) .... Use as directed 2)  Nexium 40 Mg Cpdr (Esomeprazole magnesium) .... One daily 3)  Prilosec 20 Mg Cpdr (Omeprazole) .... One by mouth daily  Other Orders: Admin 1st Vaccine (16109) Flu Vaccine 24yrs + (60454) No Charge Patient Arrived (NCPA0) (NCPA0)    ]

## 2010-03-23 NOTE — Progress Notes (Signed)
Summary: Rx request sinus congestion  Phone Note Call from Patient Call back at [phone]   Caller: Patient Call For: Jenkins/Oiva Dibari Summary of Call: Pt requesting something for ongoing congestion/cough.  Pt called on 9/2 with complaints and was recommended using Mucinex and normal saline nasal spray and Robutussin by [facility], call back if sx persist.  using all OTC meds as recommended, ongoing cough, congestion, ears possing and headaches.  No fever. [facility] Initial call taken by: Sid Falcon LPN,  October 30, 2007 4:14 PM  Follow-up for Phone Call        OV with available physician tomorrow  Scheduled office visit. Lynann Beaver [facility]  October 31, 2007 4:18 PM Follow-up by: Birdie Sons MD,  October 31, 2007 2:52 PM

## 2010-05-09 LAB — POCT URINALYSIS DIP (DEVICE)
Bilirubin Urine: NEGATIVE
Glucose, UA: NEGATIVE mg/dL
Ketones, ur: NEGATIVE mg/dL
Nitrite: NEGATIVE
Protein, ur: NEGATIVE mg/dL
Specific Gravity, Urine: 1.025 (ref 1.005–1.030)
Urobilinogen, UA: 0.2 mg/dL (ref 0.0–1.0)
pH: 5.5 (ref 5.0–8.0)

## 2010-05-09 LAB — POCT PREGNANCY, URINE: Preg Test, Ur: NEGATIVE

## 2010-10-01 ENCOUNTER — Other Ambulatory Visit (INDEPENDENT_AMBULATORY_CARE_PROVIDER_SITE_OTHER): Payer: BC Managed Care – PPO

## 2010-10-01 DIAGNOSIS — Z0000 Encounter for general adult medical examination without abnormal findings: Secondary | ICD-10-CM

## 2010-10-01 LAB — HEPATIC FUNCTION PANEL
ALT: 24 U/L (ref 0–35)
AST: 21 U/L (ref 0–37)
Albumin: 4 g/dL (ref 3.5–5.2)
Alkaline Phosphatase: 51 U/L (ref 39–117)
Bilirubin, Direct: 0.1 mg/dL (ref 0.0–0.3)
Total Bilirubin: 1.2 mg/dL (ref 0.3–1.2)
Total Protein: 6.7 g/dL (ref 6.0–8.3)

## 2010-10-01 LAB — BASIC METABOLIC PANEL
BUN: 9 mg/dL (ref 6–23)
CO2: 25 mEq/L (ref 19–32)
Calcium: 8.9 mg/dL (ref 8.4–10.5)
Chloride: 108 mEq/L (ref 96–112)
Creatinine, Ser: 1 mg/dL (ref 0.4–1.2)
GFR: 77.27 mL/min (ref >60.00)
Glucose, Bld: 80 mg/dL (ref 70–99)
Potassium: 3.9 mEq/L (ref 3.5–5.1)
Sodium: 140 mEq/L (ref 135–145)

## 2010-10-01 LAB — CBC WITH DIFFERENTIAL/PLATELET
Basophils Absolute: 0 10*3/uL (ref 0.0–0.1)
Basophils Relative: 0.4 % (ref 0.0–3.0)
Eosinophils Absolute: 0.2 10*3/uL (ref 0.0–0.7)
Eosinophils Relative: 1.9 % (ref 0.0–5.0)
HCT: 43.1 % (ref 36.0–46.0)
Hemoglobin: 14.3 g/dL (ref 12.0–15.0)
Lymphocytes Relative: 39.7 % (ref 12.0–46.0)
Lymphs Abs: 4 10*3/uL (ref 0.7–4.0)
MCHC: 33.2 g/dL (ref 30.0–36.0)
MCV: 98.1 fl (ref 78.0–100.0)
Monocytes Absolute: 0.5 10*3/uL (ref 0.1–1.0)
Monocytes Relative: 4.4 % (ref 3.0–12.0)
Neutro Abs: 5.4 10*3/uL (ref 1.4–7.7)
Neutrophils Relative %: 53.6 % (ref 43.0–77.0)
Platelets: 216 10*3/uL (ref 150.0–400.0)
RBC: 4.4 Mil/uL (ref 3.87–5.11)
RDW: 13.7 % (ref 11.5–14.6)
WBC: 10.2 10*3/uL (ref 4.5–10.5)

## 2010-10-01 LAB — POCT URINALYSIS DIPSTICK
Bilirubin, UA: NEGATIVE
Blood, UA: NEGATIVE
Glucose, UA: NEGATIVE
Ketones, UA: NEGATIVE
Nitrite, UA: NEGATIVE
Protein, UA: NEGATIVE
Spec Grav, UA: 1.02
Urobilinogen, UA: 0.2
pH, UA: 5.5

## 2010-10-01 LAB — LIPID PANEL
Cholesterol: 123 mg/dL (ref 0–200)
HDL: 39.9 mg/dL (ref >39.00)
LDL Cholesterol: 74 mg/dL (ref 0–99)
Total CHOL/HDL Ratio: 3
Triglycerides: 44 mg/dL (ref 0.0–149.0)
VLDL: 8.8 mg/dL (ref 0.0–40.0)

## 2010-10-01 LAB — TSH: TSH: 1.41 u[IU]/mL (ref 0.35–5.50)

## 2010-10-08 ENCOUNTER — Encounter: Payer: Self-pay | Admitting: Internal Medicine

## 2010-10-14 ENCOUNTER — Encounter: Payer: Self-pay | Admitting: Internal Medicine

## 2010-10-18 ENCOUNTER — Other Ambulatory Visit (HOSPITAL_COMMUNITY)
Admission: RE | Admit: 2010-10-18 | Discharge: 2010-10-18 | Disposition: A | Discharge status: Home / Self Care | Payer: BC Managed Care – PPO | Source: Ambulatory Visit | Attending: Internal Medicine | Admitting: Internal Medicine

## 2010-10-18 ENCOUNTER — Ambulatory Visit (INDEPENDENT_AMBULATORY_CARE_PROVIDER_SITE_OTHER): Payer: BC Managed Care – PPO | Admitting: Internal Medicine

## 2010-10-18 DIAGNOSIS — A499 Bacterial infection, unspecified: Secondary | ICD-10-CM

## 2010-10-18 DIAGNOSIS — N760 Acute vaginitis: Secondary | ICD-10-CM

## 2010-10-18 DIAGNOSIS — Z0000 Encounter for general adult medical examination without abnormal findings: Secondary | ICD-10-CM

## 2010-10-18 DIAGNOSIS — B9689 Other specified bacterial agents as the cause of diseases classified elsewhere: Secondary | ICD-10-CM

## 2010-10-18 DIAGNOSIS — Z01419 Encounter for gynecological examination (general) (routine) without abnormal findings: Secondary | ICD-10-CM | POA: Insufficient documentation

## 2010-10-18 MED ORDER — METRONIDAZOLE 500 MG PO TABS: 500.0000 mg | ORAL_TABLET | Freq: Three times a day (TID) | ORAL | Status: DC

## 2010-10-18 MED ORDER — METRONIDAZOLE 500 MG PO TABS: 500.0000 mg | ORAL_TABLET | Freq: Three times a day (TID) | ORAL | Status: AC

## 2010-10-21 ENCOUNTER — Encounter: Payer: Self-pay | Admitting: Internal Medicine

## 2010-10-21 NOTE — Progress Notes (Signed)
Subjective:    Patient ID: Tonya Suarez, female    DOB: 25-Mar-1970, 40 y.o.   MRN: [id number]  HPI  The patient is a 40 year old [place] American female presents for complete physical examination.  We reviewed with the patient her health maintenance schedule all of her meds and immunizations Patient complains of a vaginal discharge she states is fishy smelling She denies any dyspareunia fever or back or pelvic pain  Review of Systems  Constitutional: Negative for activity change, appetite change and fatigue.  HENT: Negative for ear pain, congestion, neck pain, postnasal drip and sinus pressure.   Eyes: Negative for redness and visual disturbance.  Respiratory: Negative for cough, shortness of breath and wheezing.   Gastrointestinal: Negative for abdominal pain and abdominal distention.  Genitourinary: Negative for dysuria, frequency and menstrual problem.  Musculoskeletal: Negative for myalgias, joint swelling and arthralgias.  Skin: Negative for rash and wound.  Neurological: Negative for dizziness, weakness and headaches.  Hematological: Negative for adenopathy. Does not bruise/bleed easily.  Psychiatric/Behavioral: Negative for sleep disturbance and decreased concentration.   Past Medical History  Diagnosis Date  . Allergy   . Depression   . GERD (gastroesophageal reflux disease)   . Arthritis   . Low back pain    Past Surgical History  Procedure Date  . Tubal ligation     reports that she has never smoked. She does not have any smokeless tobacco history on file. She reports that she does not drink alcohol or use illicit drugs. family history includes Diabetes in an unspecified family member; Hyperlipidemia in an unspecified family member; and Hypertension in an unspecified family member. No Known Allergies     Objective:   Physical Exam  Constitutional: She is oriented to person, place, and time. She appears well-developed and well-nourished. No distress.  HENT:    Head: Normocephalic and atraumatic.  Right Ear: External ear normal.  Left Ear: External ear normal.  Nose: Nose normal.  Mouth/Throat: Oropharynx is clear and moist.  Eyes: Conjunctivae and EOM are normal. Pupils are equal, round, and reactive to light.  Neck: Normal range of motion. Neck supple. No JVD present. No tracheal deviation present. No thyromegaly present.  Cardiovascular: Normal rate, regular rhythm, normal heart sounds and intact distal pulses.   No murmur heard. Pulmonary/Chest: Effort normal and breath sounds normal. She has no wheezes. She exhibits no tenderness.  Abdominal: Soft. Bowel sounds are normal.  Musculoskeletal: Normal range of motion. She exhibits no edema and no tenderness.  Lymphadenopathy:    She has no cervical adenopathy.  Neurological: She is alert and oriented to person, place, and time. She has normal reflexes. No cranial nerve deficit.  Skin: Skin is warm and dry. She is not diaphoretic.  Psychiatric: She has a normal mood and affect. Her behavior is normal.          Assessment & Plan:   This is a routine physical examination for this healthy  Female. Reviewed all health maintenance protocols including mammography colonoscopy bone density and reviewed appropriate screening labs. Her immunization history was reviewed as well as her current medications and allergies refills of her chronic medications were given and the plan for yearly health maintenance was discussed all orders and referrals were made as appropriate. She has one additional complaint today of a foul-smelling discharge.  On physical examination she did have frothy discharge from the vaginal vault and some tenderness to the cervix.  There were no adnexal masses or cervical discharge visualized.  This  most likely represents vaginitis and she'll be treated with Flagyl for a 7 day course.  Return office visit for monitoring of treatment since this appears to be recurrent and monitoring  of the prep to rule out transmittable trichomonal disease

## 2010-12-24 ENCOUNTER — Other Ambulatory Visit: Payer: Self-pay | Admitting: Occupational Medicine

## 2010-12-24 ENCOUNTER — Ambulatory Visit: Payer: BC Managed Care – PPO | Admitting: Internal Medicine

## 2010-12-24 ENCOUNTER — Ambulatory Visit: Payer: Self-pay

## 2010-12-24 DIAGNOSIS — R52 Pain, unspecified: Secondary | ICD-10-CM

## 2010-12-27 ENCOUNTER — Encounter: Payer: Self-pay | Admitting: Internal Medicine

## 2010-12-27 ENCOUNTER — Ambulatory Visit (INDEPENDENT_AMBULATORY_CARE_PROVIDER_SITE_OTHER): Payer: BC Managed Care – PPO | Admitting: Internal Medicine

## 2010-12-27 DIAGNOSIS — R1032 Left lower quadrant pain: Secondary | ICD-10-CM

## 2010-12-27 DIAGNOSIS — B9689 Other specified bacterial agents as the cause of diseases classified elsewhere: Secondary | ICD-10-CM

## 2010-12-27 DIAGNOSIS — N760 Acute vaginitis: Secondary | ICD-10-CM

## 2010-12-27 DIAGNOSIS — A499 Bacterial infection, unspecified: Secondary | ICD-10-CM

## 2010-12-27 DIAGNOSIS — N926 Irregular menstruation, unspecified: Secondary | ICD-10-CM

## 2010-12-27 MED ORDER — METRONIDAZOLE 0.75 % VA GEL: 1.0000 | Freq: Two times a day (BID) | VAGINAL | Status: AC

## 2010-12-27 NOTE — Progress Notes (Signed)
Subjective:    Patient ID: Tonya Suarez, female    DOB: 07/20/1970, 40 y.o.   MRN: [id number]  HPI Vision presents for followup of bacterial vaginosis she still has irregular menses and some discharge although it has improved significantly from a foul-smelling discharge she had before she does have some mild pelvic pain as well as persistent irregularity to her menstrual cycle.  She does have a family history of irregular menstrual cycles and her sister and mother and brother sister and mother had hysterectomies.     Review of Systems  Constitutional: Negative for activity change, appetite change and fatigue.  HENT: Negative for ear pain, congestion, neck pain, postnasal drip and sinus pressure.   Eyes: Negative for redness and visual disturbance.  Respiratory: Negative for cough, shortness of breath and wheezing.   Gastrointestinal: Negative for abdominal pain and abdominal distention.  Genitourinary: Negative for dysuria, frequency and menstrual problem.  Musculoskeletal: Negative for myalgias, joint swelling and arthralgias.  Skin: Negative for rash and wound.  Neurological: Negative for dizziness, weakness and headaches.  Hematological: Negative for adenopathy. Does not bruise/bleed easily.  Psychiatric/Behavioral: Negative for sleep disturbance and decreased concentration.   Past Medical History  Diagnosis Date  . Allergy   . Depression   . GERD (gastroesophageal reflux disease)   . Arthritis   . Low back pain    Past Surgical History  Procedure Date  . Tubal ligation     reports that she has never smoked. She does not have any smokeless tobacco history on file. She reports that she does not drink alcohol or use illicit drugs. family history includes Diabetes in an unspecified family member; Hyperlipidemia in an unspecified family member; and Hypertension in an unspecified family member. No Known Allergies     Objective:   Physical Exam  Nursing note and vitals  reviewed. Constitutional: She is oriented to person, place, and time. She appears well-developed and well-nourished. No distress.  HENT:  Head: Normocephalic and atraumatic.  Right Ear: External ear normal.  Left Ear: External ear normal.  Nose: Nose normal.  Mouth/Throat: Oropharynx is clear and moist.  Eyes: Conjunctivae and EOM are normal. Pupils are equal, round, and reactive to light.  Neck: Normal range of motion. Neck supple. No JVD present. No tracheal deviation present. No thyromegaly present.  Cardiovascular: Normal rate, regular rhythm, normal heart sounds and intact distal pulses.   No murmur heard. Pulmonary/Chest: Effort normal and breath sounds normal. She has no wheezes. She exhibits no tenderness.  Abdominal: Soft. Bowel sounds are normal.  Musculoskeletal: Normal range of motion. She exhibits no edema and no tenderness.  Lymphadenopathy:    She has no cervical adenopathy.  Neurological: She is alert and oriented to person, place, and time. She has normal reflexes. No cranial nerve deficit.  Skin: Skin is warm and dry. She is not diaphoretic.  Psychiatric: She has a normal mood and affect. Her behavior is normal.          Assessment & Plan:  He states that she has a family history of "ovarian problems" in a brother mother and her sister had hysterectomies.  She has persistent irregular menstrual cycles that seemed to improve with treatment for the vaginosis but given her family history I think it is reasonable to go ahead and get a transvaginal ultrasound to make sure that she does not have polycystic ovaries.  We will complete the treatment for vaginosis with MetroGel vaginal suppositories twice a day for 7 days with  refills.

## 2010-12-27 NOTE — Patient Instructions (Signed)
You should hear from my office this week to schedule a transvaginal ultrasound to look at your ovaries

## 2010-12-28 ENCOUNTER — Other Ambulatory Visit: Payer: Self-pay | Admitting: Internal Medicine

## 2010-12-28 DIAGNOSIS — N938 Other specified abnormal uterine and vaginal bleeding: Secondary | ICD-10-CM

## 2010-12-30 ENCOUNTER — Ambulatory Visit
Admission: RE | Admit: 2010-12-30 | Discharge: 2010-12-30 | Disposition: A | Discharge status: Home / Self Care | Payer: BC Managed Care – PPO | Source: Ambulatory Visit | Attending: Internal Medicine | Admitting: Internal Medicine

## 2010-12-30 DIAGNOSIS — R1032 Left lower quadrant pain: Secondary | ICD-10-CM

## 2010-12-30 DIAGNOSIS — N938 Other specified abnormal uterine and vaginal bleeding: Secondary | ICD-10-CM

## 2010-12-30 DIAGNOSIS — N926 Irregular menstruation, unspecified: Secondary | ICD-10-CM

## 2011-01-06 ENCOUNTER — Telehealth: Payer: Self-pay | Admitting: Internal Medicine

## 2011-01-06 NOTE — Telephone Encounter (Signed)
Pt had ultra sound done last week and it wanting results. Pt requesting you contact her

## 2011-01-06 NOTE — Telephone Encounter (Signed)
wnl per dr Lovell Sheehan-  Pt informed

## 2011-03-15 ENCOUNTER — Telehealth: Payer: Self-pay | Admitting: *Deleted

## 2011-03-15 NOTE — Telephone Encounter (Signed)
Suggested delsym or robitussin for the type of cough she has

## 2011-03-15 NOTE — Telephone Encounter (Signed)
Pt started with GI symptoms one week ago, and then developed fever of 101 for 48 hours.  Then, she developed a sore throat, productive cough, and nasal congestion.  Has been taking Mucinex, and Nyquil, but needs something stronger for cough.

## 2011-03-25 ENCOUNTER — Telehealth: Payer: Self-pay | Admitting: *Deleted

## 2011-03-25 MED ORDER — AZITHROMYCIN 250 MG PO TABS: 250.0000 mg | ORAL_TABLET | Freq: Every day | ORAL | Status: DC

## 2011-03-25 NOTE — Telephone Encounter (Signed)
Per dr jenkins- may have z pack and mucinex fast max- take as directed 

## 2011-03-25 NOTE — Telephone Encounter (Signed)
Rx sent and patient is aware. 

## 2011-03-25 NOTE — Telephone Encounter (Signed)
Pt is still feeling ill with an URI and sinus symptoms for 3 weeks now.

## 2011-04-25 ENCOUNTER — Ambulatory Visit: Payer: Self-pay | Admitting: Internal Medicine

## 2011-07-21 ENCOUNTER — Ambulatory Visit: Payer: Self-pay | Admitting: Internal Medicine

## 2011-07-21 DIAGNOSIS — Z0289 Encounter for other administrative examinations: Secondary | ICD-10-CM

## 2011-09-02 ENCOUNTER — Ambulatory Visit: Payer: BC Managed Care – PPO | Admitting: Internal Medicine

## 2011-09-02 ENCOUNTER — Emergency Department (HOSPITAL_COMMUNITY)
Admission: EM | Admit: 2011-09-02 | Discharge: 2011-09-02 | Disposition: A | Discharge status: Home / Self Care | Payer: BC Managed Care – PPO | Attending: Emergency Medicine | Admitting: Emergency Medicine

## 2011-09-02 ENCOUNTER — Encounter (HOSPITAL_COMMUNITY): Payer: Self-pay | Admitting: *Deleted

## 2011-09-02 DIAGNOSIS — K219 Gastro-esophageal reflux disease without esophagitis: Secondary | ICD-10-CM | POA: Insufficient documentation

## 2011-09-02 DIAGNOSIS — M436 Torticollis: Secondary | ICD-10-CM

## 2011-09-02 LAB — POCT I-STAT, CHEM 8
BUN: 8 mg/dL (ref 6–23)
Calcium, Ion: 1.25 mmol/L — ABNORMAL HIGH (ref 1.12–1.23)
Chloride: 104 mEq/L (ref 96–112)
Creatinine, Ser: 0.9 mg/dL (ref 0.50–1.10)
Glucose, Bld: 83 mg/dL (ref 70–99)
HCT: 43 % (ref 36.0–46.0)
Hemoglobin: 14.6 g/dL (ref 12.0–15.0)
Potassium: 3.9 mEq/L (ref 3.5–5.1)
Sodium: 140 mEq/L (ref 135–145)
TCO2: 24 mmol/L (ref 0–100)

## 2011-09-02 MED ORDER — DIAZEPAM 5 MG PO TABS: 5.0000 mg | ORAL_TABLET | Freq: Four times a day (QID) | ORAL | Status: AC | PRN

## 2011-09-02 MED ORDER — NAPROXEN 500 MG PO TABS: 500.0000 mg | ORAL_TABLET | Freq: Two times a day (BID) | ORAL | Status: DC

## 2011-09-02 MED ORDER — KETOROLAC TROMETHAMINE 60 MG/2ML IM SOLN
60.0000 mg | Freq: Once | INTRAMUSCULAR | Status: AC
  Administered 2011-09-02: 60 mg via INTRAMUSCULAR
  Filled 2011-09-02: qty 2

## 2011-09-02 MED ORDER — DIAZEPAM 5 MG PO TABS
10.0000 mg | ORAL_TABLET | Freq: Once | ORAL | Status: AC
  Administered 2011-09-02: 10 mg via ORAL
  Filled 2011-09-02: qty 2

## 2011-09-02 NOTE — ED Notes (Signed)
States her neck started hurting her yesterday. She woke up this am and it is stiff now.

## 2011-09-02 NOTE — ED Notes (Signed)
Pt reports neck pain starting yesterday at work. Pt reports pain has increased since then. Pt reports increased pain with ROM.  Pt denies history of injury. Pt reports history of bulging disc in lower back. Pt reports numbness and tingling into left hand.  Pain radiates to middle of shoulder blades.

## 2011-09-02 NOTE — ED Provider Notes (Signed)
History     CSN: [id number]  Arrival date & time 09/02/11  1246   First MD Initiated Contact with Patient 09/02/11 1332      Chief Complaint  Patient presents with  . Torticollis    (Consider location/radiation/quality/duration/timing/severity/associated sxs/prior treatment) HPI Comments: Patient with a history of depression, low back pain w disk herniation presents emergency department with chief complaint of torticollis.  Paient reports that the onset of neck pain has been gradually worsening over a week becoming severe upon waking up yesterday. Pain radiates to mid left shoulder and is associated with tingling to left hand. Pt denies any extremity numbness or weakness, fevers, night sweats, chills, IV drug use, HA, CA, or other reason to be immuno compromised.   The history is provided by the patient.    Past Medical History  Diagnosis Date  . Allergy   . Depression   . GERD (gastroesophageal reflux disease)   . Arthritis   . Low back pain     Past Surgical History  Procedure Date  . Tubal ligation     Family History  Problem Relation Age of Onset  . Diabetes    . Hyperlipidemia    . Hypertension      History  Substance Use Topics  . Smoking status: Never Smoker   . Smokeless tobacco: Not on file  . Alcohol Use: No    OB History    Grav Para Term Preterm Abortions TAB SAB Ect Mult Living                  Review of Systems  HENT: Positive for neck stiffness.   All other systems reviewed and are negative.    Allergies  Review of patient's allergies indicates no known allergies.  Home Medications   Current Outpatient Rx  Name Route Sig Dispense Refill  . IBUPROFEN 200 MG PO TABS Oral Take 400 mg by mouth every 6 (six) hours as needed. pain      BP 147/90  Pulse 71  Temp 97.9 F (36.6 C) (Oral)  Resp 17  SpO2 99%  LMP 08/29/2011  Physical Exam  Nursing note and vitals reviewed. Constitutional: She is oriented to person, place, and time.  She appears well-developed and well-nourished. No distress.  HENT:  Head: Normocephalic and atraumatic.  Eyes: Conjunctivae and EOM are normal.  Neck: Muscular tenderness present. No spinous process tenderness present. No erythema present.    Pulmonary/Chest: Effort normal.  Musculoskeletal: Normal range of motion.  Neurological: She is alert and oriented to person, place, and time.  Skin: Skin is warm and dry. No rash noted. She is not diaphoretic.  Psychiatric: She has a normal mood and affect. Her behavior is normal.    ED Course  Procedures (including critical care time)  Labs Reviewed - No data to display No results found.   No diagnosis found.    MDM  Torticollis  Etiology likely musculoskeletal. Pt afebrile with out extremity weakness. Return precautions and conservative home therapies discussed.         Jaci Carrel, [place] 09/02/11 1400

## 2011-09-04 NOTE — ED Provider Notes (Signed)
Medical screening examination/treatment/procedure(s) were performed by non-physician practitioner and as supervising physician I was immediately available for consultation/collaboration.  Mehlani Blankenburg T Hikeem Andersson, MD 09/04/11 1531 

## 2011-11-14 ENCOUNTER — Other Ambulatory Visit: Payer: Self-pay

## 2011-11-16 ENCOUNTER — Other Ambulatory Visit (INDEPENDENT_AMBULATORY_CARE_PROVIDER_SITE_OTHER): Payer: BC Managed Care – PPO

## 2011-11-16 DIAGNOSIS — Z0000 Encounter for general adult medical examination without abnormal findings: Secondary | ICD-10-CM

## 2011-11-16 LAB — CBC WITH DIFFERENTIAL/PLATELET
Basophils Absolute: 0 10*3/uL (ref 0.0–0.1)
Basophils Relative: 0.4 % (ref 0.0–3.0)
Eosinophils Absolute: 0.2 10*3/uL (ref 0.0–0.7)
Eosinophils Relative: 2.1 % (ref 0.0–5.0)
HCT: 44.2 % (ref 36.0–46.0)
Hemoglobin: 14.5 g/dL (ref 12.0–15.0)
Lymphocytes Relative: 35 % (ref 12.0–46.0)
Lymphs Abs: 2.8 10*3/uL (ref 0.7–4.0)
MCHC: 32.8 g/dL (ref 30.0–36.0)
MCV: 98.4 fl (ref 78.0–100.0)
Monocytes Absolute: 0.4 10*3/uL (ref 0.1–1.0)
Monocytes Relative: 4.9 % (ref 3.0–12.0)
Neutro Abs: 4.6 10*3/uL (ref 1.4–7.7)
Neutrophils Relative %: 57.6 % (ref 43.0–77.0)
Platelets: 218 10*3/uL (ref 150.0–400.0)
RBC: 4.49 Mil/uL (ref 3.87–5.11)
RDW: 13.5 % (ref 11.5–14.6)
WBC: 8.1 10*3/uL (ref 4.5–10.5)

## 2011-11-16 LAB — LIPID PANEL
Cholesterol: 127 mg/dL (ref 0–200)
HDL: 37 mg/dL — ABNORMAL LOW (ref >39.00)
LDL Cholesterol: 80 mg/dL (ref 0–99)
Total CHOL/HDL Ratio: 3
Triglycerides: 51 mg/dL (ref 0.0–149.0)
VLDL: 10.2 mg/dL (ref 0.0–40.0)

## 2011-11-16 LAB — POCT URINALYSIS DIPSTICK
Bilirubin, UA: NEGATIVE
Blood, UA: NEGATIVE
Glucose, UA: NEGATIVE
Ketones, UA: NEGATIVE
Leukocytes, UA: NEGATIVE
Nitrite, UA: NEGATIVE
Protein, UA: NEGATIVE
Spec Grav, UA: 1.015
Urobilinogen, UA: 1
pH, UA: 7

## 2011-11-16 LAB — BASIC METABOLIC PANEL
BUN: 10 mg/dL (ref 6–23)
CO2: 25 mEq/L (ref 19–32)
Calcium: 9 mg/dL (ref 8.4–10.5)
Chloride: 104 mEq/L (ref 96–112)
Creatinine, Ser: 0.8 mg/dL (ref 0.4–1.2)
GFR: 96.39 mL/min (ref >60.00)
Glucose, Bld: 62 mg/dL — ABNORMAL LOW (ref 70–99)
Potassium: 3.9 mEq/L (ref 3.5–5.1)
Sodium: 139 mEq/L (ref 135–145)

## 2011-11-16 LAB — HEPATIC FUNCTION PANEL
ALT: 18 U/L (ref 0–35)
AST: 17 U/L (ref 0–37)
Albumin: 3.7 g/dL (ref 3.5–5.2)
Alkaline Phosphatase: 54 U/L (ref 39–117)
Bilirubin, Direct: 0 mg/dL (ref 0.0–0.3)
Total Bilirubin: 1.1 mg/dL (ref 0.3–1.2)
Total Protein: 6.4 g/dL (ref 6.0–8.3)

## 2011-11-16 LAB — TSH: TSH: 1.08 u[IU]/mL (ref 0.35–5.50)

## 2011-11-21 ENCOUNTER — Encounter: Payer: Self-pay | Admitting: Internal Medicine

## 2011-11-21 ENCOUNTER — Ambulatory Visit (INDEPENDENT_AMBULATORY_CARE_PROVIDER_SITE_OTHER): Payer: BC Managed Care – PPO | Admitting: Internal Medicine

## 2011-11-21 VITALS — BP 144/84 | HR 76 | Temp 98.2°F | Resp 16 | Ht 70.0 in | Wt 241.0 lb

## 2011-11-21 DIAGNOSIS — Z23 Encounter for immunization: Secondary | ICD-10-CM

## 2011-11-21 DIAGNOSIS — Z0000 Encounter for general adult medical examination without abnormal findings: Secondary | ICD-10-CM

## 2011-11-21 DIAGNOSIS — I10 Essential (primary) hypertension: Secondary | ICD-10-CM | POA: Insufficient documentation

## 2011-11-21 DIAGNOSIS — M2550 Pain in unspecified joint: Secondary | ICD-10-CM

## 2011-11-21 MED ORDER — BISOPROLOL-HYDROCHLOROTHIAZIDE 2.5-6.25 MG PO TABS: 1.0000 | ORAL_TABLET | Freq: Every day | ORAL | Status: DC

## 2011-11-21 MED ORDER — MELOXICAM 15 MG PO TABS: 15.0000 mg | ORAL_TABLET | Freq: Every day | ORAL | Status: DC

## 2011-11-21 NOTE — Progress Notes (Signed)
Subjective:    Patient ID: Tonya Suarez, female    DOB: Jan 25, 1971, 41 y.o.   MRN: [id number]  HPI   presents for a wellness examination today in followup of hypertension she has noted some elevated blood pressures both systolic and diastolic by anywhere from 5-10 points. She has lost approximately 9 pounds Has been using a lot of nonsteroidals for musculoskeletal pain   Review of Systems  Constitutional: Negative for activity change, appetite change and fatigue.  HENT: Negative for ear pain, congestion, neck pain, postnasal drip and sinus pressure.   Eyes: Negative for redness and visual disturbance.  Respiratory: Negative for cough, shortness of breath and wheezing.   Gastrointestinal: Negative for abdominal pain and abdominal distention.  Genitourinary: Negative for dysuria, frequency and menstrual problem.  Musculoskeletal: Negative for myalgias, joint swelling and arthralgias.  Skin: Negative for rash and wound.  Neurological: Negative for dizziness, weakness and headaches.  Hematological: Negative for adenopathy. Does not bruise/bleed easily.  Psychiatric/Behavioral: Negative for disturbed wake/sleep cycle and decreased concentration.   Past Medical History  Diagnosis Date  . Allergy   . Depression   . GERD (gastroesophageal reflux disease)   . Arthritis   . Low back pain     History   Social History  . Marital Status: Divorced    Spouse Name: N/A    Number of Children: N/A  . Years of Education: N/A   Occupational History  . Not on file.   Social History Main Topics  . Smoking status: Never Smoker   . Smokeless tobacco: Not on file  . Alcohol Use: No  . Drug Use: No  . Sexually Active: Not on file   Other Topics Concern  . Not on file   Social History Narrative  . No narrative on file    Past Surgical History  Procedure Date  . Tubal ligation     Family History  Problem Relation Age of Onset  . Diabetes    . Hyperlipidemia    . Hypertension       No Known Allergies  Current Outpatient Prescriptions on File Prior to Visit  Medication Sig Dispense Refill  . ibuprofen (ADVIL,MOTRIN) 200 MG tablet Take 400 mg by mouth every 6 (six) hours as needed. pain      . naproxen (NAPROSYN) 500 MG tablet Take 1 tablet (500 mg total) by mouth 2 (two) times daily.  30 tablet  0    BP 144/84  Pulse 76  Temp 98.2 F (36.8 C)  Resp 16  Ht 5\' 10"  (1.778 m)  Wt 241 lb (109.317 kg)  BMI 34.58 kg/m2        Objective:   Physical Exam  Nursing note and vitals reviewed. Constitutional: She is oriented to person, place, and time. She appears well-developed and well-nourished. No distress.  HENT:  Head: Normocephalic and atraumatic.  Right Ear: External ear normal.  Left Ear: External ear normal.  Nose: Nose normal.  Mouth/Throat: Oropharynx is clear and moist.  Eyes: Conjunctivae normal and EOM are normal. Pupils are equal, round, and reactive to light.  Neck: Normal range of motion. Neck supple. No JVD present. No tracheal deviation present. No thyromegaly present.  Cardiovascular: Normal rate, regular rhythm, normal heart sounds and intact distal pulses.   No murmur heard. Pulmonary/Chest: Effort normal and breath sounds normal. She has no wheezes. She exhibits no tenderness.  Abdominal: Soft. Bowel sounds are normal.  Musculoskeletal: Normal range of motion. She exhibits no edema and no  tenderness.  Lymphadenopathy:    She has no cervical adenopathy.  Neurological: She is alert and oriented to person, place, and time. She has normal reflexes. No cranial nerve deficit.  Skin: Skin is warm and dry. She is not diaphoretic.  Psychiatric: She has a normal mood and affect. Her behavior is normal.   Chest examination showed pendulous breasts with marked fibrocystic changes but no focal  masses or adenopathy appreciated Vaginal examination was accomplished a Pap smear was obtained cervix appeared normal bimanual examination revealed no  masses in the adnexa.        Assessment & Plan:   This is a routine physical examination for this healthy  Female. Reviewed all health maintenance protocols including mammography colonoscopy bone density and reviewed appropriate screening labs. Her immunization history was reviewed as well as her current medications and allergies refills of her chronic medications were given and the plan for yearly health maintenance was discussed all orders and referrals were made as appropriate.  We will begin treatment for blood pressure with the act 2.5625 one by mouth daily and change her anti-inflammatory to Mobic

## 2011-11-21 NOTE — Patient Instructions (Signed)
The patient is instructed to continue all medications as prescribed. Schedule followup with check out clerk upon leaving the clinic  

## 2011-11-22 ENCOUNTER — Other Ambulatory Visit (HOSPITAL_COMMUNITY)
Admission: RE | Admit: 2011-11-22 | Discharge: 2011-11-22 | Disposition: A | Discharge status: Home / Self Care | Payer: BC Managed Care – PPO | Source: Ambulatory Visit | Attending: Internal Medicine | Admitting: Internal Medicine

## 2011-11-22 DIAGNOSIS — Z01419 Encounter for gynecological examination (general) (routine) without abnormal findings: Secondary | ICD-10-CM | POA: Insufficient documentation

## 2011-11-22 NOTE — Addendum Note (Signed)
Addended by: Willy Eddy on: 11/22/2011 12:48 PM   Modules accepted: Orders

## 2012-07-18 ENCOUNTER — Encounter: Payer: Self-pay | Admitting: Internal Medicine

## 2012-12-06 ENCOUNTER — Ambulatory Visit (INDEPENDENT_AMBULATORY_CARE_PROVIDER_SITE_OTHER): Payer: No Typology Code available for payment source | Admitting: Internal Medicine

## 2012-12-06 ENCOUNTER — Encounter: Payer: Self-pay | Admitting: Internal Medicine

## 2012-12-06 ENCOUNTER — Other Ambulatory Visit: Payer: Self-pay | Admitting: Internal Medicine

## 2012-12-06 VITALS — BP 150/96 | HR 71 | Temp 98.4°F | Wt 239.0 lb

## 2012-12-06 DIAGNOSIS — K219 Gastro-esophageal reflux disease without esophagitis: Secondary | ICD-10-CM

## 2012-12-06 DIAGNOSIS — K30 Functional dyspepsia: Secondary | ICD-10-CM

## 2012-12-06 DIAGNOSIS — Z299 Encounter for prophylactic measures, unspecified: Secondary | ICD-10-CM

## 2012-12-06 DIAGNOSIS — K3189 Other diseases of stomach and duodenum: Secondary | ICD-10-CM

## 2012-12-06 DIAGNOSIS — R1012 Left upper quadrant pain: Secondary | ICD-10-CM

## 2012-12-06 DIAGNOSIS — I10 Essential (primary) hypertension: Secondary | ICD-10-CM

## 2012-12-06 DIAGNOSIS — Z800 Family history of malignant neoplasm of digestive organs: Secondary | ICD-10-CM

## 2012-12-06 LAB — POCT URINALYSIS DIP (MANUAL ENTRY)
Bilirubin, UA: NEGATIVE
Glucose, UA: NEGATIVE
Ketones, POC UA: NEGATIVE
Leukocytes, UA: NEGATIVE
Nitrite, UA: NEGATIVE
Protein Ur, POC: NEGATIVE
Spec Grav, UA: 1.02
Urobilinogen, UA: 0.2
pH, UA: 6

## 2012-12-06 LAB — HEPATIC FUNCTION PANEL
ALT: 14 U/L (ref 0–35)
AST: 17 U/L (ref 0–37)
Albumin: 3.8 g/dL (ref 3.5–5.2)
Alkaline Phosphatase: 44 U/L (ref 39–117)
Bilirubin, Direct: 0.1 mg/dL (ref 0.0–0.3)
Total Bilirubin: 0.9 mg/dL (ref 0.3–1.2)
Total Protein: 7 g/dL (ref 6.0–8.3)

## 2012-12-06 LAB — SEDIMENTATION RATE: Sed Rate: 6 mm/hr (ref 0–22)

## 2012-12-06 LAB — CBC WITH DIFFERENTIAL/PLATELET
Basophils Absolute: 0 10*3/uL (ref 0.0–0.1)
Basophils Relative: 0.3 % (ref 0.0–3.0)
Eosinophils Absolute: 0.2 10*3/uL (ref 0.0–0.7)
Eosinophils Relative: 2.3 % (ref 0.0–5.0)
HCT: 44.8 % (ref 36.0–46.0)
Hemoglobin: 15.6 g/dL — ABNORMAL HIGH (ref 12.0–15.0)
Lymphocytes Relative: 34.1 % (ref 12.0–46.0)
Lymphs Abs: 2.8 10*3/uL (ref 0.7–4.0)
MCHC: 34.8 g/dL (ref 30.0–36.0)
MCV: 93.9 fl (ref 78.0–100.0)
Monocytes Absolute: 0.3 10*3/uL (ref 0.1–1.0)
Monocytes Relative: 3.9 % (ref 3.0–12.0)
Neutro Abs: 5 10*3/uL (ref 1.4–7.7)
Neutrophils Relative %: 59.4 % (ref 43.0–77.0)
Platelets: 232 10*3/uL (ref 150.0–400.0)
RBC: 4.78 Mil/uL (ref 3.87–5.11)
RDW: 12.8 % (ref 11.5–14.6)
WBC: 8.3 10*3/uL (ref 4.5–10.5)

## 2012-12-06 LAB — BASIC METABOLIC PANEL
BUN: 9 mg/dL (ref 6–23)
CO2: 27 mEq/L (ref 19–32)
Calcium: 9.3 mg/dL (ref 8.4–10.5)
Chloride: 104 mEq/L (ref 96–112)
Creatinine, Ser: 0.9 mg/dL (ref 0.4–1.2)
GFR: 85.18 mL/min (ref >60.00)
Glucose, Bld: 65 mg/dL — ABNORMAL LOW (ref 70–99)
Potassium: 3.9 mEq/L (ref 3.5–5.1)
Sodium: 140 mEq/L (ref 135–145)

## 2012-12-06 LAB — TSH: TSH: 1.53 u[IU]/mL (ref 0.35–5.50)

## 2012-12-06 LAB — LIPID PANEL
Cholesterol: 146 mg/dL (ref 0–200)
HDL: 48.2 mg/dL (ref >39.00)
LDL Cholesterol: 85 mg/dL (ref 0–99)
Total CHOL/HDL Ratio: 3
Triglycerides: 66 mg/dL (ref 0.0–149.0)
VLDL: 13.2 mg/dL (ref 0.0–40.0)

## 2012-12-06 MED ORDER — PANTOPRAZOLE SODIUM 40 MG PO TBEC: 40.0000 mg | DELAYED_RELEASE_TABLET | Freq: Every day | ORAL | Status: DC

## 2012-12-06 NOTE — Patient Instructions (Signed)
Continue lifestyle intervention healthy eating and exercise .add new acid blocker  Labs today  Will be contacted about  Ultrasound  And gi consult. Will make dr Lovell Sheehan aware.

## 2012-12-06 NOTE — Progress Notes (Signed)
Chief Complaint  Patient presents with  . GI Problem    Also complain of indigestion and acid reflux.  Has tried Pepcid OTC,.    HPI: Patient comes in today for SDA for  new problem evaluation. PCP NA  Called a few weeks  Ago about same and never got called back. ( no notation in ehrthat i can find) ireeg  bm    Typical 1 per week  . Better  on cycle . Going on for years  however now having increasing sx  Indigestion and some post prandial sx  Chest sx.  Burning and acidy at times and some noctural rual sx.  tums recnetly  And tried  nexium otc    For a few weeks.  Some helps continuing  Had tomiss work today  Remote hx of nexium . inpast but this time not that helpful  Never had egd etc  ROS: See pertinent positives and negatives per HPI.tryinng to lose weight novomiting  Some decrease energy. No fever.  No blood in bm. Needs note for work  Tobacco no  caffiene ocass.  No eat late.  etoh ocass .  irreg peridos on ocps .  Btl.  Sis has had duodenal adenocarcinoma.       Past Medical History  Diagnosis Date  . Allergy   . Depression   . GERD (gastroesophageal reflux disease)   . Arthritis   . Low back pain     Family History  Problem Relation Age of Onset  . Diabetes    . Hyperlipidemia    . Hypertension    . Cancer - Other Sister     History   Social History  . Marital Status: Divorced    Spouse Name: N/A    Number of Children: N/A  . Years of Education: N/A   Social History Main Topics  . Smoking status: Never Smoker   . Smokeless tobacco: None  . Alcohol Use: No  . Drug Use: No  . Sexual Activity: None   Other Topics Concern  . None   Social History Narrative  . None    Outpatient Encounter Prescriptions as of 12/06/2012  Medication Sig Dispense Refill  . bisoprolol-hydrochlorothiazide (ZIAC) 2.5-6.25 MG per tablet Take 1 tablet by mouth daily.  30 tablet  11  . meloxicam (MOBIC) 15 MG tablet TAKE 1 TABLET (15 MG TOTAL) BY MOUTH DAILY.  30 tablet  1   . pantoprazole (PROTONIX) 40 MG tablet Take 1 tablet (40 mg total) by mouth daily.  30 tablet  3  . [DISCONTINUED] meloxicam (MOBIC) 15 MG tablet Take 1 tablet (15 mg total) by mouth daily.  30 tablet  5   No facility-administered encounter medications on file as of 12/06/2012.    EXAM:  BP 150/96  Pulse 71  Temp(Src) 98.4 F (36.9 C) (Oral)  Wt 239 lb (108.41 kg)  BMI 34.29 kg/m2  SpO2 98%  Body mass index is 34.29 kg/(m^2).  GENERAL: vitals reviewed and listed above, alert, oriented, appears well hydrated and in no acute distress  HEENT: atraumatic, conjunctiva  clear, no obvious abnormalities on inspection of external nose and ears OP : no lesion edema or exudate  NECK: no obvious masses on inspection palpation  noadenopathy LUNGS: clear to auscultation bilaterally, no wheezes, rales or rhonchi, good air movement CV: HRRR, no clubbing cyanosis or  peripheral edema nl cap refill  Abdomen:  Sof,t normal bowel sounds without hepatosplenomegaly, no guarding rebound or masses no CVA tenderness  Mild subjective tenderness left upper   MS: moves all extremities without noticeable focal  abnormality PSYCH: pleasant and cooperative, no obvious depression or anxiety  ASSESSMENT AND PLAN:  Discussed the following assessment and plan:  Abdominal pain, left upper quadrant - newer onset baseline consti-ation fam hx of unusual gi cancer  gi consult - Plan: Basic metabolic panel, CBC with Differential, Hepatic function panel, Lipid panel, TSH, Sedimentation rate, POCT urinalysis dipstick, US Abdomen Complete, Ambulatory referral to Gastroenterology  Persistent indigestion - Plan: Basic metabolic panel, CBC with Differential, Hepatic function panel, Lipid panel, TSH, Sedimentation rate, POCT urinalysis dipstick, US Abdomen Complete, Ambulatory referral to Gastroenterology  Preventive measure - can get cpx labs today due pcp t see in january - Plan: Basic metabolic panel, CBC with  Differential, Hepatic function panel, Lipid panel, TSH, Sedimentation rate, POCT urinalysis dipstick  HTN (hypertension) - up today check readings 3 x per week fu if continued elevated  - Plan: Basic metabolic panel, CBC with Differential, Hepatic function panel, Lipid panel, TSH, Sedimentation rate, POCT urinalysis dipstick  GERD - ? with hx of same  change  to protonix   - Plan: Basic metabolic panel, CBC with Differential, Hepatic function panel, Lipid panel, TSH, Sedimentation rate, POCT urinalysis dipstick  Family history of cancer of GI tract - sister duodenal adenoca - Plan: Ambulatory referral to Gastroenterology  -Patient advised to return or notify health care team  if symptoms worsen or persist or new concerns arise.  Patient Instructions  Continue lifestyle intervention healthy eating and exercise .add new acid blocker  Labs today  Will be contacted about  Ultrasound  And gi consult. Will make dr Lovell Sheehan aware.    Neta Mends. Tiasia Weberg M.D.

## 2012-12-07 ENCOUNTER — Encounter: Payer: Self-pay | Admitting: Gastroenterology

## 2012-12-11 ENCOUNTER — Ambulatory Visit
Admission: RE | Admit: 2012-12-11 | Discharge: 2012-12-11 | Disposition: A | Discharge status: Home / Self Care | Payer: No Typology Code available for payment source | Source: Ambulatory Visit | Attending: Internal Medicine | Admitting: Internal Medicine

## 2012-12-11 DIAGNOSIS — R1012 Left upper quadrant pain: Secondary | ICD-10-CM

## 2012-12-11 DIAGNOSIS — K30 Functional dyspepsia: Secondary | ICD-10-CM

## 2012-12-16 ENCOUNTER — Other Ambulatory Visit: Payer: Self-pay | Admitting: Internal Medicine

## 2013-01-08 ENCOUNTER — Ambulatory Visit: Payer: Self-pay | Admitting: Gastroenterology

## 2013-02-15 ENCOUNTER — Other Ambulatory Visit (INDEPENDENT_AMBULATORY_CARE_PROVIDER_SITE_OTHER): Payer: No Typology Code available for payment source

## 2013-02-15 DIAGNOSIS — Z0000 Encounter for general adult medical examination without abnormal findings: Secondary | ICD-10-CM

## 2013-02-15 LAB — BASIC METABOLIC PANEL
BUN: 15 mg/dL (ref 6–23)
CO2: 25 mEq/L (ref 19–32)
Calcium: 8.8 mg/dL (ref 8.4–10.5)
Chloride: 106 mEq/L (ref 96–112)
Creatinine, Ser: 0.9 mg/dL (ref 0.4–1.2)
GFR: 91.99 mL/min (ref >60.00)
Glucose, Bld: 93 mg/dL (ref 70–99)
Potassium: 3.9 mEq/L (ref 3.5–5.1)
Sodium: 136 mEq/L (ref 135–145)

## 2013-02-15 LAB — CBC WITH DIFFERENTIAL/PLATELET
Basophils Absolute: 0 10*3/uL (ref 0.0–0.1)
Basophils Relative: 0.3 % (ref 0.0–3.0)
Eosinophils Absolute: 0.2 10*3/uL (ref 0.0–0.7)
Eosinophils Relative: 1.8 % (ref 0.0–5.0)
HCT: 42.8 % (ref 36.0–46.0)
Hemoglobin: 14.6 g/dL (ref 12.0–15.0)
Lymphocytes Relative: 32.6 % (ref 12.0–46.0)
Lymphs Abs: 3 10*3/uL (ref 0.7–4.0)
MCHC: 34.2 g/dL (ref 30.0–36.0)
MCV: 93.2 fl (ref 78.0–100.0)
Monocytes Absolute: 0.4 10*3/uL (ref 0.1–1.0)
Monocytes Relative: 4.3 % (ref 3.0–12.0)
Neutro Abs: 5.7 10*3/uL (ref 1.4–7.7)
Neutrophils Relative %: 61 % (ref 43.0–77.0)
Platelets: 235 10*3/uL (ref 150.0–400.0)
RBC: 4.59 Mil/uL (ref 3.87–5.11)
RDW: 12.7 % (ref 11.5–14.6)
WBC: 9.3 10*3/uL (ref 4.5–10.5)

## 2013-02-15 LAB — LIPID PANEL
Cholesterol: 129 mg/dL (ref 0–200)
HDL: 44.2 mg/dL (ref >39.00)
LDL Cholesterol: 69 mg/dL (ref 0–99)
Total CHOL/HDL Ratio: 3
Triglycerides: 80 mg/dL (ref 0.0–149.0)
VLDL: 16 mg/dL (ref 0.0–40.0)

## 2013-02-15 LAB — POCT URINALYSIS DIPSTICK
Bilirubin, UA: NEGATIVE
Blood, UA: NEGATIVE
Glucose, UA: NEGATIVE
Ketones, UA: NEGATIVE
Nitrite, UA: NEGATIVE
Protein, UA: NEGATIVE
Spec Grav, UA: 1.02
Urobilinogen, UA: 2
pH, UA: 7

## 2013-02-15 LAB — TSH: TSH: 0.3 u[IU]/mL — ABNORMAL LOW (ref 0.35–5.50)

## 2013-02-15 LAB — HEPATIC FUNCTION PANEL
ALT: 12 U/L (ref 0–35)
AST: 14 U/L (ref 0–37)
Albumin: 3.6 g/dL (ref 3.5–5.2)
Alkaline Phosphatase: 44 U/L (ref 39–117)
Bilirubin, Direct: 0 mg/dL (ref 0.0–0.3)
Total Bilirubin: 0.6 mg/dL (ref 0.3–1.2)
Total Protein: 6.4 g/dL (ref 6.0–8.3)

## 2013-02-22 ENCOUNTER — Ambulatory Visit (INDEPENDENT_AMBULATORY_CARE_PROVIDER_SITE_OTHER): Payer: BC Managed Care – PPO | Admitting: Internal Medicine

## 2013-02-22 ENCOUNTER — Encounter: Payer: Self-pay | Admitting: Internal Medicine

## 2013-02-22 VITALS — BP 134/80 | HR 76 | Temp 98.2°F | Resp 16 | Ht 70.0 in | Wt 254.0 lb

## 2013-02-22 DIAGNOSIS — M79605 Pain in left leg: Secondary | ICD-10-CM

## 2013-02-22 DIAGNOSIS — M79604 Pain in right leg: Secondary | ICD-10-CM

## 2013-02-22 DIAGNOSIS — Z0000 Encounter for general adult medical examination without abnormal findings: Secondary | ICD-10-CM

## 2013-02-22 DIAGNOSIS — M545 Low back pain, unspecified: Secondary | ICD-10-CM

## 2013-02-22 DIAGNOSIS — R946 Abnormal results of thyroid function studies: Secondary | ICD-10-CM

## 2013-02-22 DIAGNOSIS — R7989 Other specified abnormal findings of blood chemistry: Secondary | ICD-10-CM

## 2013-02-22 LAB — T4, FREE: Free T4: 0.7 ng/dL (ref 0.60–1.60)

## 2013-02-22 MED ORDER — METHYLPREDNISOLONE (PAK) 4 MG PO TABS: 4.0000 mg | ORAL_TABLET | Freq: Every day | ORAL | Status: DC

## 2013-02-22 NOTE — Progress Notes (Signed)
Pre visit review using our clinic review tool, if applicable. No additional management support is needed unless otherwise documented below in the visit note. 

## 2013-02-22 NOTE — Progress Notes (Signed)
Subjective:    Patient ID: Tonya Suarez, female    DOB: 20-Nov-1970, 43 y.o.   MRN: [id number]  HPI CPX HTN and GERD Hx of abnormal paps seen by GYN Allergies with seasonal  Flair Weight gain Decreased tsh   Review of Systems  Constitutional: Negative for activity change, appetite change and fatigue.  HENT: Negative for congestion, ear pain, postnasal drip and sinus pressure.   Eyes: Negative for redness and visual disturbance.  Respiratory: Negative for cough, shortness of breath and wheezing.   Gastrointestinal: Negative for abdominal pain and abdominal distention.  Genitourinary: Negative for dysuria, frequency and menstrual problem.  Musculoskeletal: Negative for arthralgias, joint swelling, myalgias and neck pain.  Skin: Negative for rash and wound.  Neurological: Negative for dizziness, weakness and headaches.  Hematological: Negative for adenopathy. Does not bruise/bleed easily.  Psychiatric/Behavioral: Negative for sleep disturbance and decreased concentration.   Past Medical History  Diagnosis Date  . Allergy   . Depression   . GERD (gastroesophageal reflux disease)   . Arthritis   . Low back pain     History   Social History  . Marital Status: Divorced    Spouse Name: N/A    Number of Children: N/A  . Years of Education: N/A   Occupational History  . Not on file.   Social History Main Topics  . Smoking status: Never Smoker   . Smokeless tobacco: Not on file  . Alcohol Use: No  . Drug Use: No  . Sexual Activity: Not on file   Other Topics Concern  . Not on file   Social History Narrative  . No narrative on file    Past Surgical History  Procedure Laterality Date  . Tubal ligation      Family History  Problem Relation Age of Onset  . Diabetes    . Hyperlipidemia    . Hypertension    . Cancer - Other Sister     No Known Allergies  Current Outpatient Prescriptions on File Prior to Visit  Medication Sig Dispense Refill  .  bisoprolol-hydrochlorothiazide (ZIAC) 2.5-6.25 MG per tablet TAKE 1 TABLET BY MOUTH DAILY.  30 tablet  9  . meloxicam (MOBIC) 15 MG tablet TAKE 1 TABLET (15 MG TOTAL) BY MOUTH DAILY.  30 tablet  1  . pantoprazole (PROTONIX) 40 MG tablet Take 1 tablet (40 mg total) by mouth daily.  30 tablet  3   No current facility-administered medications on file prior to visit.    BP 134/80  Pulse 76  Temp(Src) 98.2 F (36.8 C)  Resp 16  Ht 5\' 10"  (1.778 m)  Wt 254 lb (115.214 kg)  BMI 36.45 kg/m2       Objective:   Physical Exam  Nursing note and vitals reviewed. Constitutional: She is oriented to person, place, and time. She appears well-developed and well-nourished. No distress.  HENT:  Head: Normocephalic and atraumatic.  Eyes: Conjunctivae and EOM are normal. Pupils are equal, round, and reactive to light.  Neck: Normal range of motion. Neck supple. No JVD present. No tracheal deviation present. No thyromegaly present.  Cardiovascular: Normal rate and regular rhythm.   No murmur heard. Pulmonary/Chest: Effort normal and breath sounds normal. She has no wheezes. She exhibits no tenderness.  Abdominal: Soft. Bowel sounds are normal.  Musculoskeletal: Normal range of motion. She exhibits no edema and no tenderness.  Lymphadenopathy:    She has no cervical adenopathy.  Neurological: She is alert and oriented to person, place,  and time. She has normal reflexes. No cranial nerve deficit.  Skin: Skin is warm and dry. She is not diaphoretic.  Psychiatric: She has a normal mood and affect. Her behavior is normal.   PaP and breast exam at [facility]:   This is a routine physical examination for this healthy  Female. Reviewed all health maintenance protocols including mammography colonoscopy bone density and reviewed appropriate screening labs. Her immunization history was reviewed as well as her current medications and allergies refills of her chronic medications were given  and the plan for yearly health maintenance was discussed all orders and referrals were made as appropriate.  Because of the suppressed TSH we will check a T4 free.  Due to recurrent back pain in the middle back radiating into the leg (right) we will prescribe a Dosepak and monitor

## 2013-02-22 NOTE — Patient Instructions (Signed)
Back Exercises Back exercises help treat and prevent back injuries. The goal of back exercises is to increase the strength of your abdominal and back muscles and the flexibility of your back. These exercises should be started when you no longer have back pain. Back exercises include:  Pelvic Tilt. Lie on your back with your knees bent. Tilt your pelvis until the lower part of your back is against the floor. Hold this position 5 to 10 sec and repeat 5 to 10 times.  Knee to Chest. Pull first 1 knee up against your chest and hold for 20 to 30 seconds, repeat this with the other knee, and then both knees. This may be done with the other leg straight or bent, whichever feels better.  Sit-Ups or Curl-Ups. Bend your knees 90 degrees. Start with tilting your pelvis, and do a partial, slow sit-up, lifting your trunk only 30 to 45 degrees off the floor. Take at least 2 to 3 seconds for each sit-up. Do not do sit-ups with your knees out straight. If partial sit-ups are difficult, simply do the above but with only tightening your abdominal muscles and holding it as directed.  Hip-Lift. Lie on your back with your knees flexed 90 degrees. Push down with your feet and shoulders as you raise your hips a couple inches off the floor; hold for 10 seconds, repeat 5 to 10 times.  Back arches. Lie on your stomach, propping yourself up on bent elbows. Slowly press on your hands, causing an arch in your low back. Repeat 3 to 5 times. Any initial stiffness and discomfort should lessen with repetition over time.  Shoulder-Lifts. Lie face down with arms beside your body. Keep hips and torso pressed to floor as you slowly lift your head and shoulders off the floor. Do not overdo your exercises, especially in the beginning. Exercises may cause you some mild back discomfort which lasts for a few minutes; however, if the pain is more severe, or lasts for more than 15 minutes, do not continue exercises until you see your caregiver.  Improvement with exercise therapy for back problems is slow.  See your caregivers for assistance with developing a proper back exercise program. Document Released: 03/17/2004 Document Revised: 05/02/2011 Document Reviewed: 12/09/2010 [employer] Patient Information 2014 [employer].  

## 2013-04-04 ENCOUNTER — Emergency Department (HOSPITAL_COMMUNITY): Payer: BC Managed Care – PPO

## 2013-04-04 ENCOUNTER — Emergency Department (HOSPITAL_COMMUNITY)
Admission: EM | Admit: 2013-04-04 | Discharge: 2013-04-05 | Disposition: A | Discharge status: Home / Self Care | Payer: BC Managed Care – PPO | Attending: Emergency Medicine | Admitting: Emergency Medicine

## 2013-04-04 ENCOUNTER — Encounter (HOSPITAL_COMMUNITY): Payer: Self-pay | Admitting: Emergency Medicine

## 2013-04-04 DIAGNOSIS — M129 Arthropathy, unspecified: Secondary | ICD-10-CM | POA: Insufficient documentation

## 2013-04-04 DIAGNOSIS — R0789 Other chest pain: Secondary | ICD-10-CM | POA: Insufficient documentation

## 2013-04-04 DIAGNOSIS — Z791 Long term (current) use of non-steroidal anti-inflammatories (NSAID): Secondary | ICD-10-CM | POA: Insufficient documentation

## 2013-04-04 DIAGNOSIS — K219 Gastro-esophageal reflux disease without esophagitis: Secondary | ICD-10-CM | POA: Insufficient documentation

## 2013-04-04 DIAGNOSIS — M545 Low back pain, unspecified: Secondary | ICD-10-CM | POA: Insufficient documentation

## 2013-04-04 DIAGNOSIS — Z79899 Other long term (current) drug therapy: Secondary | ICD-10-CM | POA: Insufficient documentation

## 2013-04-04 DIAGNOSIS — Z8659 Personal history of other mental and behavioral disorders: Secondary | ICD-10-CM | POA: Insufficient documentation

## 2013-04-04 LAB — CBC
HCT: 39.1 % (ref 36.0–46.0)
Hemoglobin: 14.7 g/dL (ref 12.0–15.0)
MCH: 32 pg (ref 26.0–34.0)
MCHC: 36.6 g/dL — ABNORMAL HIGH (ref 30.0–36.0)
MCV: 85 fL (ref 78.0–100.0)
Platelets: 290 10*3/uL (ref 150–400)
RBC: 4.6 MIL/uL (ref 3.87–5.11)
RDW: 12.8 % (ref 11.5–15.5)
WBC: 11 10*3/uL — ABNORMAL HIGH (ref 4.0–10.5)

## 2013-04-04 LAB — BASIC METABOLIC PANEL
BUN: 9 mg/dL (ref 6–23)
CO2: 21 mEq/L (ref 19–32)
Calcium: 9.6 mg/dL (ref 8.4–10.5)
Chloride: 100 mEq/L (ref 96–112)
Creatinine, Ser: 0.94 mg/dL (ref 0.50–1.10)
GFR calc Af Amer: 84 mL/min — ABNORMAL LOW (ref >90)
GFR calc non Af Amer: 73 mL/min — ABNORMAL LOW (ref >90)
Glucose, Bld: 87 mg/dL (ref 70–99)
Potassium: 3.4 mEq/L — ABNORMAL LOW (ref 3.7–5.3)
Sodium: 137 mEq/L (ref 137–147)

## 2013-04-04 LAB — D-DIMER, QUANTITATIVE: D-Dimer, Quant: 0.71 ug/mL-FEU — ABNORMAL HIGH (ref 0.00–0.48)

## 2013-04-04 LAB — TROPONIN I: Troponin I: <0.3 ng/mL (ref <0.30)

## 2013-04-04 MED ORDER — IOHEXOL 350 MG/ML SOLN
100.0000 mL | Freq: Once | INTRAVENOUS | Status: AC | PRN
  Administered 2013-04-04: 100 mL via INTRAVENOUS

## 2013-04-04 MED ORDER — HYDROCODONE-ACETAMINOPHEN 5-325 MG PO TABS: 1.0000 | ORAL_TABLET | ORAL | Status: DC | PRN

## 2013-04-04 MED ORDER — IBUPROFEN 600 MG PO TABS: 600.0000 mg | ORAL_TABLET | Freq: Four times a day (QID) | ORAL | Status: DC | PRN

## 2013-04-04 MED ORDER — ASPIRIN 325 MG PO TABS
325.0000 mg | ORAL_TABLET | ORAL | Status: AC
  Administered 2013-04-04: 325 mg via ORAL
  Filled 2013-04-04: qty 1

## 2013-04-04 MED ORDER — HYDROMORPHONE HCL PF 1 MG/ML IJ SOLN
1.0000 mg | Freq: Once | INTRAMUSCULAR | Status: AC
  Administered 2013-04-04: 1 mg via INTRAVENOUS
  Filled 2013-04-04: qty 1

## 2013-04-04 MED ORDER — IBUPROFEN 800 MG PO TABS
800.0000 mg | ORAL_TABLET | Freq: Once | ORAL | Status: AC
  Administered 2013-04-05: 800 mg via ORAL
  Filled 2013-04-04: qty 1

## 2013-04-04 NOTE — ED Notes (Signed)
MD at bedside. 

## 2013-04-04 NOTE — ED Provider Notes (Signed)
CSN: [id number]     Arrival date & time 04/04/13  2000 History   First MD Initiated Contact with Patient 04/04/13 2019     Chief Complaint  Patient presents with  . Chest Pain     (Consider location/radiation/quality/duration/timing/severity/associated sxs/prior Treatment) HPI Comments: 43 year old female presents with acute onset of left-sided chest and back pain. She states started about an hour and a half prior to arrival while cooking in the kitchen. She states that standing straight up makes the pain worse. If she sits down and leans forward the pain seems to improve. The pain is worse with inspiration. Denies any actual shortness of breath or cough. States the pain seems wraparound under her breast to her upper back. Does not seem to go from her chest straight through to her back. Denies any nausea or vomiting. No diaphoresis. Used to be a smoker but has not smoked in over 2 years. Has a history of borderline high blood pressure. The pain is about 8/10 when she is standing straight up. If she relaxes and leans forward it is more like a 4/10. She states she's not having a leg swelling but has had cramping in her legs left greater than right for several weeks. She's currently on birth control pills.   Past Medical History  Diagnosis Date  . Allergy   . Depression   . GERD (gastroesophageal reflux disease)   . Arthritis   . Low back pain    Past Surgical History  Procedure Laterality Date  . Tubal ligation     Family History  Problem Relation Age of Onset  . Diabetes    . Hyperlipidemia    . Hypertension    . Cancer - Other Sister    History  Substance Use Topics  . Smoking status: Never Smoker   . Smokeless tobacco: Not on file  . Alcohol Use: No   OB History   Grav Para Term Preterm Abortions TAB SAB Ect Mult Living                 Review of Systems  Constitutional: Negative for fever and diaphoresis.  Respiratory: Negative for cough and shortness of breath.    Cardiovascular: Positive for chest pain. Negative for leg swelling.  Gastrointestinal: Negative for nausea and vomiting.  Musculoskeletal: Positive for back pain.  All other systems reviewed and are negative.      Allergies  Review of patient's allergies indicates no known allergies.  Home Medications   Current Outpatient Rx  Name  Route  Sig  Dispense  Refill  . bisoprolol-hydrochlorothiazide (ZIAC) 2.5-6.25 MG per tablet      TAKE 1 TABLET BY MOUTH DAILY.   30 tablet   9   . meloxicam (MOBIC) 15 MG tablet      TAKE 1 TABLET (15 MG TOTAL) BY MOUTH DAILY.   30 tablet   1   . pantoprazole (PROTONIX) 40 MG tablet   Oral   Take 1 tablet (40 mg total) by mouth daily.   30 tablet   3    BP 133/85  Temp(Src) 98.2 F (36.8 C) (Oral)  Resp 18  SpO2 99%  LMP 03/31/2013 Physical Exam  Nursing note and vitals reviewed. Constitutional: She is oriented to person, place, and time. She appears well-developed and well-nourished.  HENT:  Head: Normocephalic and atraumatic.  Right Ear: External ear normal.  Left Ear: External ear normal.  Nose: Nose normal.  Eyes: Right eye exhibits no discharge. Left eye exhibits  no discharge.  Cardiovascular: Normal rate, regular rhythm and normal heart sounds.   Pulmonary/Chest: Effort normal and breath sounds normal. She exhibits tenderness.  Abdominal: Soft. She exhibits no distension. There is no tenderness.  Musculoskeletal: She exhibits no edema and no tenderness.  Neurological: She is alert and oriented to person, place, and time.  Skin: Skin is warm and dry.    ED Course  Procedures (including critical care time) Labs Review Labs Reviewed  CBC - Abnormal; Notable for the following:    WBC 11.0 (*)    MCHC 36.6 (*)    All other components within normal limits  BASIC METABOLIC PANEL - Abnormal; Notable for the following:    Potassium 3.4 (*)    GFR calc non Af Amer 73 (*)    GFR calc Af Amer 84 (*)    All other components  within normal limits  D-DIMER, QUANTITATIVE - Abnormal; Notable for the following:    D-Dimer, Quant 0.71 (*)    All other components within normal limits  TROPONIN I  TROPONIN I   Imaging Review Ct Angio Chest Pe W/cm &/or Wo Cm  04/04/2013   CLINICAL DATA:  Sudden onset left-sided chest pain radiating through the back. Onset of pain 1 hr ago.  EXAM: CT ANGIOGRAPHY CHEST WITH CONTRAST  TECHNIQUE: Multidetector CT imaging of the chest was performed using the standard protocol during bolus administration of intravenous contrast. Multiplanar CT image reconstructions and MIPs were obtained to evaluate the vascular anatomy.  CONTRAST:  159mL OMNIPAQUE IOHEXOL 350 MG/ML SOLN  COMPARISON:  DG CHEST 1V PORT dated 04/04/2013  FINDINGS: There is mild bolus dispersion present on the examination. The study is adequate to evaluate for pulmonary embolus and no pulmonary embolism is present. Good opacification of the lower lobes bilaterally. Aorta and branch vessels appear within normal limits. No axillary adenopathy. No mediastinal or hilar adenopathy. Prominent superior pericardial recess. No pericardial effusion. No pleural effusion. Incidental imaging of the upper abdomen is within normal limits. Bovine aortic arch incidentally noted. No aggressive osseous lesions.  Review of the MIP images confirms the above findings.  IMPRESSION: Negative CTA chest.   Electronically Signed   By: Dereck Ligas M.D.   On: 04/04/2013 23:45   Dg Chest Port 1 View  04/04/2013   CLINICAL DATA:  Chest pain  EXAM: PORTABLE CHEST - 1 VIEW  COMPARISON:  None.  FINDINGS: The heart size and mediastinal contours are within normal limits. Both lungs are clear. The visualized skeletal structures are unremarkable.  IMPRESSION: No active disease.   Electronically Signed   By: Inez Catalina M.D.   On: 04/04/2013 20:45    EKG Interpretation    Date/Time:  Thursday April 04 2013 20:10:27 EST Ventricular Rate:  82 PR Interval:  181 QRS  Duration: 89 QT Interval:  396 QTC Calculation: 462 R Axis:   -18 Text Interpretation:  Sinus rhythm Borderline left axis deviation Baseline wander in lead(s) II III aVF No significant change since last tracing Confirmed by Mallisa Alameda  MD, Carmita Boom ([id number]) on 04/04/2013 8:30:47 PM            MDM   Final diagnoses:  Atypical chest pain    Patient with atypical chest pain. Worse with ROM of shoulder, palpation and certain positions. EKG benign, and initial troponin negative. With atypical story and low risk history combined with low HEART score, I feel ACS is highly unlikely. More likely MSK or inflammatory. Given her pleuritic sx she was evaluated  for PE, has negative scan. I feel she can be discharged given negative w/u. Encouraged close f/u with PCP and discussed strict return precautions. Given time of arrival, a 2nd troponin was ordered 4 hours after first. Care transferred to oncoming MD with plan in place to d/c if repeat EKG and troponin are benign.     Ephraim Hamburger, MD 04/04/13 [phone]

## 2013-04-04 NOTE — ED Notes (Signed)
Patient transported to CT 

## 2013-04-04 NOTE — ED Notes (Signed)
Patient presents today with a chief complaint of sudden onset left sided chest pain with radiation through back that began one hour ago. Patient reports pain is present and at an 8/10. Patient reports associated symptoms of shortness of breath and dizziness. Denies taking any medication over the counter

## 2013-04-04 NOTE — Discharge Instructions (Signed)
Your caregiver has diagnosed you as having chest pain that is not specific for one problem, but does not require admission.  You are at low risk for an acute heart condition or other serious illness. Chest pain comes from many different causes.  °SEEK IMMEDIATE MEDICAL ATTENTION IF: °You have severe chest pain, especially if the pain is crushing or pressure-like and spreads to the arms, back, neck, or jaw, or if you have sweating, nausea (feeling sick to your stomach), or shortness of breath. THIS IS AN EMERGENCY. Don't wait to see if the pain will go away. Get medical help at once. Call 911 or 0 (operator). DO NOT drive yourself to the hospital.  °Your chest pain gets worse and does not go away with rest.  °You have an attack of chest pain lasting longer than usual, despite rest and treatment with the medications your caregiver has prescribed.  °You wake from sleep with chest pain or shortness of breath.  °You feel dizzy or faint.  °You have chest pain not typical of your usual pain for which you originally saw your caregiver. ° °Chest Pain (Nonspecific) °It is often hard to give a specific diagnosis for the cause of chest pain. There is always a chance that your pain could be related to something serious, such as a heart attack or a blood clot in the lungs. You need to follow up with your caregiver for further evaluation. °CAUSES  °· Heartburn. °· Pneumonia or bronchitis. °· Anxiety or stress. °· Inflammation around your heart (pericarditis) or lung (pleuritis or pleurisy). °· A blood clot in the lung. °· A collapsed lung (pneumothorax). It can develop suddenly on its own (spontaneous pneumothorax) or from injury (trauma) to the chest. °· Shingles infection (herpes zoster virus). °The chest wall is composed of bones, muscles, and cartilage. Any of these can be the source of the pain. °· The bones can be bruised by injury. °· The muscles or cartilage can be strained by coughing or overwork. °· The cartilage can be  affected by inflammation and become sore (costochondritis). °DIAGNOSIS  °Lab tests or other studies, such as X-rays, electrocardiography, stress testing, or cardiac imaging, may be needed to find the cause of your pain.  °TREATMENT  °· Treatment depends on what may be causing your chest pain. Treatment may include: °· Acid blockers for heartburn. °· Anti-inflammatory medicine. °· Pain medicine for inflammatory conditions. °· Antibiotics if an infection is present. °· You may be advised to change lifestyle habits. This includes stopping smoking and avoiding alcohol, caffeine, and chocolate. °· You may be advised to keep your head raised (elevated) when sleeping. This reduces the chance of acid going backward from your stomach into your esophagus. °· Most of the time, nonspecific chest pain will improve within 2 to 3 days with rest and mild pain medicine. °HOME CARE INSTRUCTIONS  °· If antibiotics were prescribed, take your antibiotics as directed. Finish them even if you start to feel better. °· For the next few days, avoid physical activities that bring on chest pain. Continue physical activities as directed. °· Do not smoke. °· Avoid drinking alcohol. °· Only take over-the-counter or prescription medicine for pain, discomfort, or fever as directed by your caregiver. °· Follow your caregiver's suggestions for further testing if your chest pain does not go away. °· Keep any follow-up appointments you made. If you do not go to an appointment, you could develop lasting (chronic) problems with pain. If there is any problem keeping an appointment,   you must call to reschedule. °SEEK MEDICAL CARE IF:  °· You think you are having problems from the medicine you are taking. Read your medicine instructions carefully. °· Your chest pain does not go away, even after treatment. °· You develop a rash with blisters on your chest. °SEEK IMMEDIATE MEDICAL CARE IF:  °· You have increased chest pain or pain that spreads to your arm,  neck, jaw, back, or abdomen. °· You develop shortness of breath, an increasing cough, or you are coughing up blood. °· You have severe back or abdominal pain, feel nauseous, or vomit. °· You develop severe weakness, fainting, or chills. °· You have a fever. °THIS IS AN EMERGENCY. Do not wait to see if the pain will go away. Get medical help at once. Call your local emergency services (911 in [place] Do not drive yourself to the hospital. °MAKE SURE YOU:  °· Understand these instructions. °· Will watch your condition. °· Will get help right away if you are not doing well or get worse. °Document Released: 11/17/2004 Document Revised: 05/02/2011 Document Reviewed: 09/13/2007 °[employer]® Patient Information ©2014 [employer]. ° °

## 2013-04-05 LAB — TROPONIN I: Troponin I: <0.3 ng/mL (ref <0.30)

## 2013-04-08 ENCOUNTER — Telehealth: Payer: Self-pay | Admitting: Internal Medicine

## 2013-04-08 ENCOUNTER — Ambulatory Visit (INDEPENDENT_AMBULATORY_CARE_PROVIDER_SITE_OTHER): Payer: BC Managed Care – PPO | Admitting: Family

## 2013-04-08 ENCOUNTER — Encounter: Payer: Self-pay | Admitting: Family

## 2013-04-08 VITALS — BP 132/80 | HR 67 | Wt 255.0 lb

## 2013-04-08 DIAGNOSIS — R071 Chest pain on breathing: Secondary | ICD-10-CM

## 2013-04-08 DIAGNOSIS — R0789 Other chest pain: Secondary | ICD-10-CM

## 2013-04-08 DIAGNOSIS — Z800 Family history of malignant neoplasm of digestive organs: Secondary | ICD-10-CM

## 2013-04-08 DIAGNOSIS — F430 Acute stress reaction: Secondary | ICD-10-CM

## 2013-04-08 MED ORDER — METHYLPREDNISOLONE 4 MG PO KIT: PACK | ORAL | Status: AC

## 2013-04-08 NOTE — Progress Notes (Signed)
Pre visit review using our clinic review tool, if applicable. No additional management support is needed unless otherwise documented below in the visit note. 

## 2013-04-08 NOTE — Patient Instructions (Signed)
Chest Wall Pain Chest wall pain is pain in or around the bones and muscles of your chest. It may take up to 6 weeks to get better. It may take longer if you must stay physically active in your work and activities.  CAUSES  Chest wall pain may happen on its own. However, it may be caused by:  A viral illness like the flu.  Injury.  Coughing.  Exercise.  Arthritis.  Fibromyalgia.  Shingles. HOME CARE INSTRUCTIONS   Avoid overtiring physical activity. Try not to strain or perform activities that cause pain. This includes any activities using your chest or your abdominal and side muscles, especially if heavy weights are used.  Put ice on the sore area.  Put ice in a plastic bag.  Place a towel between your skin and the bag.  Leave the ice on for 15-20 minutes per hour while awake for the first 2 days.  Only take over-the-counter or prescription medicines for pain, discomfort, or fever as directed by your caregiver. SEEK IMMEDIATE MEDICAL CARE IF:   Your pain increases, or you are very uncomfortable.  You have a fever.  Your chest pain becomes worse.  You have new, unexplained symptoms.  You have nausea or vomiting.  You feel sweaty or lightheaded.  You have a cough with phlegm (sputum), or you cough up blood. MAKE SURE YOU:   Understand these instructions.  Will watch your condition.  Will get help right away if you are not doing well or get worse. Document Released: 02/07/2005 Document Revised: 05/02/2011 Document Reviewed: 10/04/2010 [facility] Patient Information 2014 [place], [place].   Stress Stress-related medical problems are becoming increasingly common. The body has a built-in physical response to stressful situations. Faced with pressure, challenge or danger, we need to react quickly. Our bodies release hormones such as cortisol and adrenaline to help do this. These hormones are part of the "fight or flight" response and affect the metabolic rate, heart  rate and blood pressure, resulting in a heightened, stressed state that prepares the body for optimum performance in dealing with a stressful situation. It is likely that early man required these mechanisms to stay alive, but usually modern stresses do not call for this, and the same hormones released in today's world can damage health and reduce coping ability. CAUSES  Pressure to perform at work, at school or in sports.  Threats of physical violence.  Money worries.  Arguments.  Family conflicts.  Divorce or separation from significant other.  Bereavement.  New job or unemployment.  Changes in location.  Alcohol or drug abuse. SOMETIMES, THERE IS NO PARTICULAR REASON FOR DEVELOPING STRESS. Almost all people are at risk of being stressed at some time in their lives. It is important to know that some stress is temporary and some is long term.  Temporary stress will go away when a situation is resolved. Most people can cope with short periods of stress, and it can often be relieved by relaxing, taking a walk, chatting through issues with friends, or having a good night's sleep.  Chronic (long-term, continuous) stress is much harder to deal with. It can be psychologically and emotionally damaging. It can be harmful both for an individual and for friends and family. SYMPTOMS Everyone reacts to stress differently. There are some common effects that help [place] recognize it. In times of extreme stress, people may:  Shake uncontrollably.  Breathe faster and deeper than normal (hyperventilate).  Vomit.  For people with asthma, stress can trigger an  attack.  For some people, stress may trigger migraine headaches, ulcers, and body pain. PHYSICAL EFFECTS OF STRESS MAY INCLUDE:  Loss of energy.  Skin problems.  Aches and pains resulting from tense muscles, including neck ache, backache and tension headaches.  Increased pain from arthritis and other conditions.  Irregular heart  beat (palpitations).  Periods of irritability or anger.  Apathy or depression.  Anxiety (feeling uptight or worrying).  Unusual behavior.  Loss of appetite.  Comfort eating.  Lack of concentration.  Loss of, or decreased, sex-drive.  Increased smoking, drinking, or recreational drug use.  For women, missed periods.  Ulcers, joint pain, and muscle pain. Post-traumatic stress is the stress caused by any serious accident, strong emotional damage, or extremely difficult or violent experience such as rape or war. Post-traumatic stress victims can experience mixtures of emotions such as fear, shame, depression, guilt or anger. It may include recurrent memories or images that may be haunting. These feelings can last for weeks, months or even years after the traumatic event that triggered them. Specialized treatment, possibly with medicines and psychological therapies, is available. If stress is causing physical symptoms, severe distress or making it difficult for you to function as normal, it is worth seeing your caregiver. It is important to remember that although stress is a usual part of life, extreme or prolonged stress can lead to other illnesses that will need treatment. It is better to visit a doctor sooner rather than later. Stress has been linked to the development of high blood pressure and heart disease, as well as insomnia and depression. There is no diagnostic test for stress since everyone reacts to it differently. But a caregiver will be able to spot the physical symptoms, such as:  Headaches.  Shingles.  Ulcers. Emotional distress such as intense worry, low mood or irritability should be detected when the doctor asks pertinent questions to identify any underlying problems that might be the cause. In case there are physical reasons for the symptoms, the doctor may also want to do some tests to exclude certain conditions. If you feel that you are suffering from stress, try to  identify the aspects of your life that are causing it. Sometimes you may not be able to change or avoid them, but even a small change can have a positive ripple effect. A simple lifestyle change can make all the difference. STRATEGIES THAT CAN HELP DEAL WITH STRESS:  Delegating or sharing responsibilities.  Avoiding confrontations.  Learning to be more assertive.  Regular exercise.  Avoid using alcohol or street drugs to cope.  Eating a healthy, balanced diet, rich in fruit and vegetables and proteins.  Finding humor or absurdity in stressful situations.  Never taking on more than you know you can handle comfortably.  Organizing your time better to get as much done as possible.  Talking to friends or family and sharing your thoughts and fears.  Listening to music or relaxation tapes.  Tensing and then relaxing your muscles, starting at the toes and working up to the head and neck. If you think that you would benefit from help, either in identifying the things that are causing your stress or in learning techniques to help you relax, see a caregiver who is capable of helping you with this. Rather than relying on medications, it is usually better to try and identify the things in your life that are causing stress and try to deal with them. There are many techniques of managing stress including counseling, psychotherapy,  aromatherapy, yoga, and exercise. Your caregiver can help you determine what is best for you. Document Released: 04/30/2002 Document Revised: 05/02/2011 Document Reviewed: 03/27/2007 [facility] Patient Information 2014 [place], [place].

## 2013-04-08 NOTE — Telephone Encounter (Signed)
Pt is requesting to switch physicians, due to availability, pt wants to switch to R[facility] C[facility] as her primary physician.

## 2013-04-08 NOTE — Progress Notes (Signed)
Subjective:    Patient ID: Tonya Suarez, female    DOB: 03-20-1970, 43 y.o.   MRN: [id number]  HPI 43 year old AAF, nonsmoker, patient of Dr. Arnoldo Morale is in today as a follow-up from the ED on 04/04/13. She presented with sudden chest pain and left upper back pain that was worse with movement. She had cardiac enzymes, EKG, CXR, and labs done that were all negative. Reports pain 8/10, worse with movement. Ibuprofen and Norco helps. Reports increased stress dealing with her sisters diagnosis of stage 4 duodenal cancer. Father had colon cancer but is alive and well. PGM deceased with colon cancer.    Review of Systems  Constitutional: Negative.   HENT: Negative.   Respiratory: Negative.   Cardiovascular: Positive for chest pain. Negative for palpitations and leg swelling.  Gastrointestinal: Negative.   Endocrine: Negative.   Genitourinary: Negative.   Musculoskeletal: Negative.   Skin: Negative.   Neurological: Negative.   Hematological: Negative.   Psychiatric/Behavioral: Negative.    Past Medical History  Diagnosis Date  . Allergy   . Depression   . GERD (gastroesophageal reflux disease)   . Arthritis   . Low back pain     History   Social History  . Marital Status: Divorced    Spouse Name: N/A    Number of Children: N/A  . Years of Education: N/A   Occupational History  . Not on file.   Social History Main Topics  . Smoking status: Never Smoker   . Smokeless tobacco: Not on file  . Alcohol Use: No  . Drug Use: No  . Sexual Activity: Not on file   Other Topics Concern  . Not on file   Social History Narrative  . No narrative on file    Past Surgical History  Procedure Laterality Date  . Tubal ligation      Family History  Problem Relation Age of Onset  . Diabetes    . Hyperlipidemia    . Hypertension    . Cancer - Other Sister     No Known Allergies  Current Outpatient Prescriptions on File Prior to Visit  Medication Sig Dispense Refill  .  bisoprolol-hydrochlorothiazide (ZIAC) 2.5-6.25 MG per tablet TAKE 1 TABLET BY MOUTH DAILY.  30 tablet  9  . HYDROcodone-acetaminophen (NORCO) 5-325 MG per tablet Take 1 tablet by mouth every 4 (four) hours as needed.  15 tablet  0  . meloxicam (MOBIC) 15 MG tablet Take 15 mg by mouth daily as needed for pain.      . pantoprazole (PROTONIX) 40 MG tablet Take 1 tablet (40 mg total) by mouth daily.  30 tablet  3   No current facility-administered medications on file prior to visit.    BP 132/80  Pulse 67  Wt 255 lb (115.667 kg)  LMP 02/08/2015chart     Objective:   Physical Exam  Constitutional: She is oriented to person, place, and time. She appears well-developed.  Neck: Normal range of motion. Neck supple.  Cardiovascular: Normal rate, regular rhythm and normal heart sounds.   Pulmonary/Chest: Effort normal and breath sounds normal.  Abdominal: Soft. Bowel sounds are normal.  Musculoskeletal: Normal range of motion. She exhibits tenderness.  Left upper back tenderness to palpation.   Neurological: She is alert and oriented to person, place, and time.  Skin: Skin is warm and dry.  Psychiatric: She has a normal mood and affect.          Assessment & Plan:  [name] was seen today for follow-up.  Diagnoses and associated orders for this visit:  Chest wall pain  Family history of colon cancer - Ambulatory referral to Colorectal Surgery  Stress reaction  Other Orders - methylPREDNISolone (MEDROL DOSEPAK) 4 MG tablet; follow package directions   Encouraged a healthy diet, exercise. Consider therapy for stress with sisters illness.

## 2013-04-08 NOTE — Telephone Encounter (Signed)
Dr Arnoldo Morale said that is fine.  padonda is a wise choice per dr Arnoldo Morale

## 2013-05-10 ENCOUNTER — Encounter: Payer: Self-pay | Admitting: Internal Medicine

## 2013-05-12 ENCOUNTER — Other Ambulatory Visit: Payer: Self-pay | Admitting: Internal Medicine

## 2013-05-20 ENCOUNTER — Encounter: Payer: Self-pay | Admitting: Family

## 2013-05-20 ENCOUNTER — Ambulatory Visit (INDEPENDENT_AMBULATORY_CARE_PROVIDER_SITE_OTHER): Payer: BC Managed Care – PPO | Admitting: Family

## 2013-05-20 VITALS — BP 134/88 | HR 82 | Temp 98.9°F | Wt 254.0 lb

## 2013-05-20 DIAGNOSIS — K644 Residual hemorrhoidal skin tags: Secondary | ICD-10-CM

## 2013-05-20 DIAGNOSIS — K6289 Other specified diseases of anus and rectum: Secondary | ICD-10-CM

## 2013-05-20 MED ORDER — HYDROCORTISONE ACETATE 25 MG RE SUPP: 25.0000 mg | Freq: Two times a day (BID) | RECTAL | Status: DC

## 2013-05-20 MED ORDER — LIDOCAINE HCL 2 % EX GEL: 1.0000 "application " | CUTANEOUS | Status: DC | PRN

## 2013-05-20 NOTE — Telephone Encounter (Signed)
Ok per [employer]

## 2013-05-20 NOTE — Patient Instructions (Signed)
Fiber Content in Foods  Drinking plenty of fluids and consuming foods high in fiber can help with constipation. See the list below for the fiber content of some common foods.  Starches and Grains / Dietary Fiber (g)  · Cheerios, 1 cup / 3 g  · Kellogg's Corn Flakes, 1 cup / 0.7 g  · Rice Krispies, 1 ¼ cup / 0.3 g  · Quaker Oat Life Cereal, ¾ cup / 2.1 g  · Oatmeal, instant (cooked), ½ cup / 2 g  · Kellogg's Frosted Mini Wheats, 1 cup / 5.1 g  · Rice, brown, long-grain (cooked), 1 cup / 3.5 g  · Rice, white, long-grain (cooked), 1 cup / 0.6 g  · Macaroni, cooked, enriched, 1 cup / 2.5 g  Legumes / Dietary Fiber (g)  · Beans, baked, canned, plain or vegetarian, ½ cup / 5.2 g  · Beans, kidney, canned, ½ cup / 6.8 g  · Beans, pinto, dried (cooked), ½ cup / 7.7 g  · Beans, pinto, canned, ½ cup / 5.5 g  Breads and Crackers / Dietary Fiber (g)  · Graham crackers, plain or honey, 2 squares / 0.7 g  · Saltine crackers, 3 squares / 0.3 g  · Pretzels, plain, salted, 10 pieces / 1.8 g  · Bread, whole-wheat, 1 slice / 1.9 g  · Bread, white, 1 slice / 0.7 g  · Bread, raisin, 1 slice / 1.2 g  · Bagel, plain, 3 oz / 2 g  · Tortilla, flour, 1 oz / 0.9 g  · Tortilla, corn, 1 small / 1.5 g  · Bun, hamburger or hotdog, 1 small / 0.9 g  Fruits / Dietary Fiber (g)  · Apple, raw with skin, 1 medium / 4.4 g  · Applesauce, sweetened, ½ cup / 1.5 g  · Banana, ½ medium / 1.5 g  · Grapes, 10 grapes / 0.4 g  · Orange, 1 small / 2.3 g  · Raisin, 1.5 oz / 1.6 g  · Melon, 1 cup / 1.4 g  Vegetables / Dietary Fiber (g)  · Green beans, canned, ½ cup / 1.3 g  · Carrots (cooked), ½ cup / 2.3 g  · Broccoli (cooked), ½ cup / 2.8 g  · Peas, frozen (cooked), ½ cup / 4.4 g  · Potatoes, mashed, ½ cup / 1.6 g  · Lettuce, 1 cup / 0.5 g  · Corn, canned, ½ cup / 1.6 g  · Tomato, ½ cup / 1.1 g  Document Released: 06/26/2006 Document Revised: 05/02/2011 Document Reviewed: 08/21/2006  [employer]® Patient Information ©2014 [employer].

## 2013-05-20 NOTE — Telephone Encounter (Signed)
Pt is aware.  

## 2013-05-20 NOTE — Progress Notes (Signed)
Pre visit review using our clinic review tool, if applicable. No additional management support is needed unless otherwise documented below in the visit note. 

## 2013-05-20 NOTE — Progress Notes (Signed)
   Subjective:    Patient ID: Tonya Suarez, female    DOB: 12/07/1970, 43 y.o.   MRN: [id number]  HPI 43 year old AAf nonsmoker, is in today with c/o rectal pain x 2 days. Reports constipation with approx 2 bowel movements per day. Has been using Prep H without relief.    Review of Systems  Constitutional: Negative.   Respiratory: Negative.   Cardiovascular: Negative.   Gastrointestinal: Positive for rectal pain. Negative for anal bleeding.  Genitourinary: Negative.   Musculoskeletal: Positive for back pain.  Neurological: Negative.   Psychiatric/Behavioral: Negative.    Past Medical History  Diagnosis Date  . Allergy   . Depression   . GERD (gastroesophageal reflux disease)   . Arthritis   . Low back pain     History   Social History  . Marital Status: Divorced    Spouse Name: N/A    Number of Children: N/A  . Years of Education: N/A   Occupational History  . Not on file.   Social History Main Topics  . Smoking status: Never Smoker   . Smokeless tobacco: Not on file  . Alcohol Use: No  . Drug Use: No  . Sexual Activity: Not on file   Other Topics Concern  . Not on file   Social History Narrative  . No narrative on file    Past Surgical History  Procedure Laterality Date  . Tubal ligation      Family History  Problem Relation Age of Onset  . Diabetes    . Hyperlipidemia    . Hypertension    . Cancer - Other Sister     No Known Allergies  Current Outpatient Prescriptions on File Prior to Visit  Medication Sig Dispense Refill  . bisoprolol-hydrochlorothiazide (ZIAC) 2.5-6.25 MG per tablet TAKE 1 TABLET BY MOUTH DAILY.  30 tablet  9  . HYDROcodone-acetaminophen (NORCO) 5-325 MG per tablet Take 1 tablet by mouth every 4 (four) hours as needed.  15 tablet  0  . meloxicam (MOBIC) 15 MG tablet Take 15 mg by mouth daily as needed for pain.      . pantoprazole (PROTONIX) 40 MG tablet TAKE 1 TABLET (40 MG TOTAL) BY MOUTH DAILY.  30 tablet  9   No  current facility-administered medications on file prior to visit.    BP 134/88  Pulse 82  Temp(Src) 98.9 F (37.2 C) (Oral)  Wt 254 lb (115.214 kg)chart     Objective:   Physical Exam  Constitutional: She is oriented to person, place, and time. She appears well-developed and well-nourished.  Neck: Normal range of motion. Neck supple.  Pulmonary/Chest: Effort normal and breath sounds normal.  Abdominal: Soft. Bowel sounds are normal. She exhibits no mass. There is no rebound.  Genitourinary:     Neurological: She is alert and oriented to person, place, and time.  Skin: Skin is warm and dry.  Psychiatric: She has a normal mood and affect.          Assessment & Plan:  Lavonn was seen today for no specified reason.  Diagnoses and associated orders for this visit:  External hemorrhoid  Other Orders - hydrocortisone (ANUSOL-HC) 25 MG suppository; Place 1 suppository (25 mg total) rectally 2 (two) times daily. - lidocaine (XYLOCAINE JELLY) 2 % jelly; Apply 1 application topically as needed.   Call the office with any questions or concerns. Recheck as scheduled and as needed.

## 2013-07-03 ENCOUNTER — Ambulatory Visit (INDEPENDENT_AMBULATORY_CARE_PROVIDER_SITE_OTHER): Payer: BC Managed Care – PPO | Admitting: Internal Medicine

## 2013-07-03 ENCOUNTER — Encounter: Payer: Self-pay | Admitting: Internal Medicine

## 2013-07-03 VITALS — BP 120/80 | HR 92 | Temp 98.7°F | Resp 20 | Ht 70.0 in | Wt 264.0 lb

## 2013-07-03 DIAGNOSIS — K219 Gastro-esophageal reflux disease without esophagitis: Secondary | ICD-10-CM

## 2013-07-03 DIAGNOSIS — F329 Major depressive disorder, single episode, unspecified: Secondary | ICD-10-CM

## 2013-07-03 DIAGNOSIS — F3289 Other specified depressive episodes: Secondary | ICD-10-CM

## 2013-07-03 MED ORDER — METOCLOPRAMIDE HCL 10 MG PO TABS: 10.0000 mg | ORAL_TABLET | Freq: Three times a day (TID) | ORAL | Status: DC

## 2013-07-03 NOTE — Progress Notes (Signed)
Subjective:    Patient ID: Tonya Suarez, female    DOB: 1970-09-14, 43 y.o.   MRN: [id number]  HPI  43 year old patient who has a history of GERD, as well as depression.  She is a considerable situational stress, recently, due to the poor health of her sister, who has recently died from duodenal cancer.  For the past 2 or 3 weeks.  She has had increasing fatigue, as well as epigastric pain.  Pain occurs 10-15 minutes postprandially.  No nausea, vomiting, or change in her bowel habits.  She is chronically constipated and has a bowel movement approximately 3 times per week.  This has been unchanged.  She also has a history of intermittent abdominal pain  Past Medical History  Diagnosis Date  . Allergy   . Depression   . GERD (gastroesophageal reflux disease)   . Arthritis   . Low back pain     History   Social History  . Marital Status: Divorced    Spouse Name: N/A    Number of Children: N/A  . Years of Education: N/A   Occupational History  . Not on file.   Social History Main Topics  . Smoking status: Never Smoker   . Smokeless tobacco: Not on file  . Alcohol Use: No  . Drug Use: No  . Sexual Activity: Not on file   Other Topics Concern  . Not on file   Social History Narrative  . No narrative on file    Past Surgical History  Procedure Laterality Date  . Tubal ligation      Family History  Problem Relation Age of Onset  . Diabetes    . Hyperlipidemia    . Hypertension    . Cancer - Other Sister     No Known Allergies  Current Outpatient Prescriptions on File Prior to Visit  Medication Sig Dispense Refill  . bisoprolol-hydrochlorothiazide (ZIAC) 2.5-6.25 MG per tablet TAKE 1 TABLET BY MOUTH DAILY.  30 tablet  9  . HYDROcodone-acetaminophen (NORCO) 5-325 MG per tablet Take 1 tablet by mouth every 4 (four) hours as needed.  15 tablet  0  . hydrocortisone (ANUSOL-HC) 25 MG suppository Place 1 suppository (25 mg total) rectally 2 (two) times daily.  12  suppository  0  . lidocaine (XYLOCAINE JELLY) 2 % jelly Apply 1 application topically as needed.  30 mL  0  . meloxicam (MOBIC) 15 MG tablet Take 15 mg by mouth daily as needed for pain.      . pantoprazole (PROTONIX) 40 MG tablet TAKE 1 TABLET (40 MG TOTAL) BY MOUTH DAILY.  30 tablet  9   No current facility-administered medications on file prior to visit.    BP 120/80  Pulse 92  Temp(Src) 98.7 F (37.1 C) (Oral)  Resp 20  Ht 5\' 10"  (1.778 m)  Wt 264 lb (119.75 kg)  BMI 37.88 kg/m2  SpO2 98%       Review of Systems  Constitutional: Negative.   HENT: Negative for congestion, dental problem, hearing loss, rhinorrhea, sinus pressure, sore throat and tinnitus.   Eyes: Negative for pain, discharge and visual disturbance.  Respiratory: Negative for cough and shortness of breath.   Cardiovascular: Negative for chest pain, palpitations and leg swelling.  Gastrointestinal: Positive for abdominal pain. Negative for nausea, vomiting, diarrhea, constipation, blood in stool and abdominal distention.  Genitourinary: Negative for dysuria, urgency, frequency, hematuria, flank pain, vaginal bleeding, vaginal discharge, difficulty urinating, vaginal pain and pelvic pain.  Musculoskeletal: Negative for arthralgias, gait problem and joint swelling.  Skin: Negative for rash.  Neurological: Negative for dizziness, syncope, speech difficulty, weakness, numbness and headaches.  Hematological: Negative for adenopathy.  Psychiatric/Behavioral: Negative for behavioral problems, dysphoric mood and agitation. The patient is not nervous/anxious.        Objective:   Physical Exam  Constitutional: She is oriented to person, place, and time. She appears well-developed and well-nourished.  HENT:  Head: Normocephalic.  Right Ear: External ear normal.  Left Ear: External ear normal.  Mouth/Throat: Oropharynx is clear and moist.  Eyes: Conjunctivae and EOM are normal. Pupils are equal, round, and reactive  to light.  Neck: Normal range of motion. Neck supple. No thyromegaly present.  Cardiovascular: Normal rate, regular rhythm, normal heart sounds and intact distal pulses.   Pulmonary/Chest: Effort normal and breath sounds normal.  Abdominal: Soft. Bowel sounds are normal. She exhibits no distension and no mass. There is tenderness. There is no rebound and no guarding.  Very mild epigastric discomfort with palpation  Musculoskeletal: Normal range of motion.  Lymphadenopathy:    She has no cervical adenopathy.  Neurological: She is alert and oriented to person, place, and time.  Skin: Skin is warm and dry. No rash noted.  Psychiatric: She has a normal mood and affect. Her behavior is normal.          Assessment & Plan:   Postprandial abdominal pain History of GERD  we'll add metoclopramide, short-term.  We'll substitute Dexilant for Protonix.  More aggressive antireflux regimen discussed  A note to be out of work until Monday, May 18 written

## 2013-07-03 NOTE — Progress Notes (Signed)
Pre-visit discussion using our clinic review tool. No additional management support is needed unless otherwise documented below in the visit note.  

## 2013-07-03 NOTE — Patient Instructions (Signed)
Avoids foods high in acid such as tomatoes citrus juices, and spicy foods.  Avoid eating within two hours of lying down or before exercising.  Do not overheat.  Try smaller more frequent meals.  If symptoms persist, elevate the head of her bed 12 inches while sleeping.  Substitute DEXILANT for Protonix

## 2013-07-08 ENCOUNTER — Ambulatory Visit: Payer: BC Managed Care – PPO | Admitting: Family

## 2013-07-24 ENCOUNTER — Other Ambulatory Visit: Payer: Self-pay | Admitting: Internal Medicine

## 2013-09-27 ENCOUNTER — Other Ambulatory Visit: Payer: Self-pay | Admitting: Family

## 2013-12-16 ENCOUNTER — Telehealth: Payer: Self-pay | Admitting: Family

## 2013-12-16 MED ORDER — BISOPROLOL-HYDROCHLOROTHIAZIDE 2.5-6.25 MG PO TABS: ORAL_TABLET | ORAL | Status: DC

## 2013-12-16 NOTE — Telephone Encounter (Signed)
[id number] - [place], [place] [address] is requesting re-fill on bisoprolol-hydrochlorothiazide (ZIAC) 2.5-6.25 MG per tablet

## 2013-12-16 NOTE — Telephone Encounter (Signed)
Done

## 2013-12-17 MED ORDER — BISOPROLOL-HYDROCHLOROTHIAZIDE 2.5-6.25 MG PO TABS: ORAL_TABLET | ORAL | Status: DC

## 2013-12-17 NOTE — Addendum Note (Signed)
Addended by: Santiago Bumpers on: 12/17/2013 11:50 AM   Modules accepted: Orders

## 2013-12-17 NOTE — Telephone Encounter (Signed)
Can you please re-send the below medication to [facility], it was sent to the wrong location.

## 2014-01-27 ENCOUNTER — Ambulatory Visit (INDEPENDENT_AMBULATORY_CARE_PROVIDER_SITE_OTHER): Payer: BC Managed Care – PPO | Admitting: Family Medicine

## 2014-01-27 ENCOUNTER — Encounter: Payer: Self-pay | Admitting: Family Medicine

## 2014-01-27 VITALS — BP 120/82 | HR 76 | Temp 98.0°F | Ht 70.0 in | Wt 259.6 lb

## 2014-01-27 DIAGNOSIS — J069 Acute upper respiratory infection, unspecified: Secondary | ICD-10-CM

## 2014-01-27 MED ORDER — AZITHROMYCIN 250 MG PO TABS: ORAL_TABLET | ORAL | Status: DC

## 2014-01-27 NOTE — Progress Notes (Signed)
HPI:  -started: 2 weeks ago -symptoms:nasal congestion, sore throat, cough, hoarseness, sinus pressure - mild sinus pain -denies:fever, SOB, NVD, tooth pain, body aches -has tried: nothing -sick contacts/travel/risks: denies flu exposure, tick exposure or or Ebola risks -Hx of: allergies ROS: See pertinent positives and negatives per HPI.  Past Medical History  Diagnosis Date  . Allergy   . Depression   . GERD (gastroesophageal reflux disease)   . Arthritis   . Low back pain     Past Surgical History  Procedure Laterality Date  . Tubal ligation      Family History  Problem Relation Age of Onset  . Diabetes    . Hyperlipidemia    . Hypertension    . Cancer - Other Sister     History   Social History  . Marital Status: Divorced    Spouse Name: N/A    Number of Children: N/A  . Years of Education: N/A   Social History Main Topics  . Smoking status: Never Smoker   . Smokeless tobacco: None  . Alcohol Use: No  . Drug Use: No  . Sexual Activity: None   Other Topics Concern  . None   Social History Narrative    Current outpatient prescriptions: bisoprolol-hydrochlorothiazide (ZIAC) 2.5-6.25 MG per tablet, TAKE 1 TABLET BY MOUTH DAILY., Disp: 90 tablet, Rfl: 0;  HYDROcodone-acetaminophen (NORCO) 5-325 MG per tablet, Take 1 tablet by mouth every 4 (four) hours as needed., Disp: 15 tablet, Rfl: 0;  meloxicam (MOBIC) 15 MG tablet, Take 15 mg by mouth daily as needed for pain., Disp: , Rfl:  pantoprazole (PROTONIX) 40 MG tablet, TAKE 1 TABLET (40 MG TOTAL) BY MOUTH DAILY., Disp: 30 tablet, Rfl: 9;  azithromycin (ZITHROMAX) 250 MG tablet, 2 tabs on day one then 1 tab daily, Disp: 6 tablet, Rfl: 0  EXAM:  Filed Vitals:   01/27/14 1427  BP: 120/82  Pulse: 76  Temp: 98 F (36.7 C)    Body mass index is 37.25 kg/(m^2).  GENERAL: vitals reviewed and listed above, alert, oriented, appears well hydrated and in no acute distress  HEENT: atraumatic, conjunttiva  clear, no obvious abnormalities on inspection of external nose and ears, normal appearance of ear canals and TMs, clear nasal congestion, mild post oropharyngeal erythema with PND, no tonsillar edema or exudate, no sinus TTP  NECK: no obvious masses on inspection  LUNGS: clear to auscultation bilaterally, no wheezes, rales or rhonchi, good air movement  CV: HRRR, no peripheral edema  MS: moves all extremities without noticeable abnormality  PSYCH: pleasant and cooperative, no obvious depression or anxiety  ASSESSMENT AND PLAN:  Discussed the following assessment and plan:  Acute upper respiratory infection - Plan: azithromycin (ZITHROMAX) 250 MG tablet  -given HPI and exam findings today, a serious infection or illness is unlikely. We discussed potential etiologies, with VURI being most likely, and advised supportive care and monitoring. We discussed treatment side effects, likely course, antibiotic misuse, transmission, and signs of developing a serious illness. We talked about the different signs and symptoms of viral versus bacterial infections and opted to do trial of AFRIN for a few days, but abx incase not improving or worsening in case of sinustis - she is to shred this if does not need and follow up if sig worse or not improving. -of course, we advised to return or notify a doctor immediately if symptoms worsen or persist or new concerns arise.    There are no Patient Instructions on file  for this visit.   Colin Benton R.

## 2014-01-27 NOTE — Patient Instructions (Signed)

## 2014-01-27 NOTE — Progress Notes (Signed)
Pre visit review using our clinic review tool, if applicable. No additional management support is needed unless otherwise documented below in the visit note. 

## 2014-02-28 IMAGING — CR DG CHEST 1V PORT
1 series · 1 of 1 positions shown · non-contrast
Comparison: None.

CLINICAL DATA: Chest pain

EXAM:
PORTABLE CHEST - 1 VIEW

[AP]
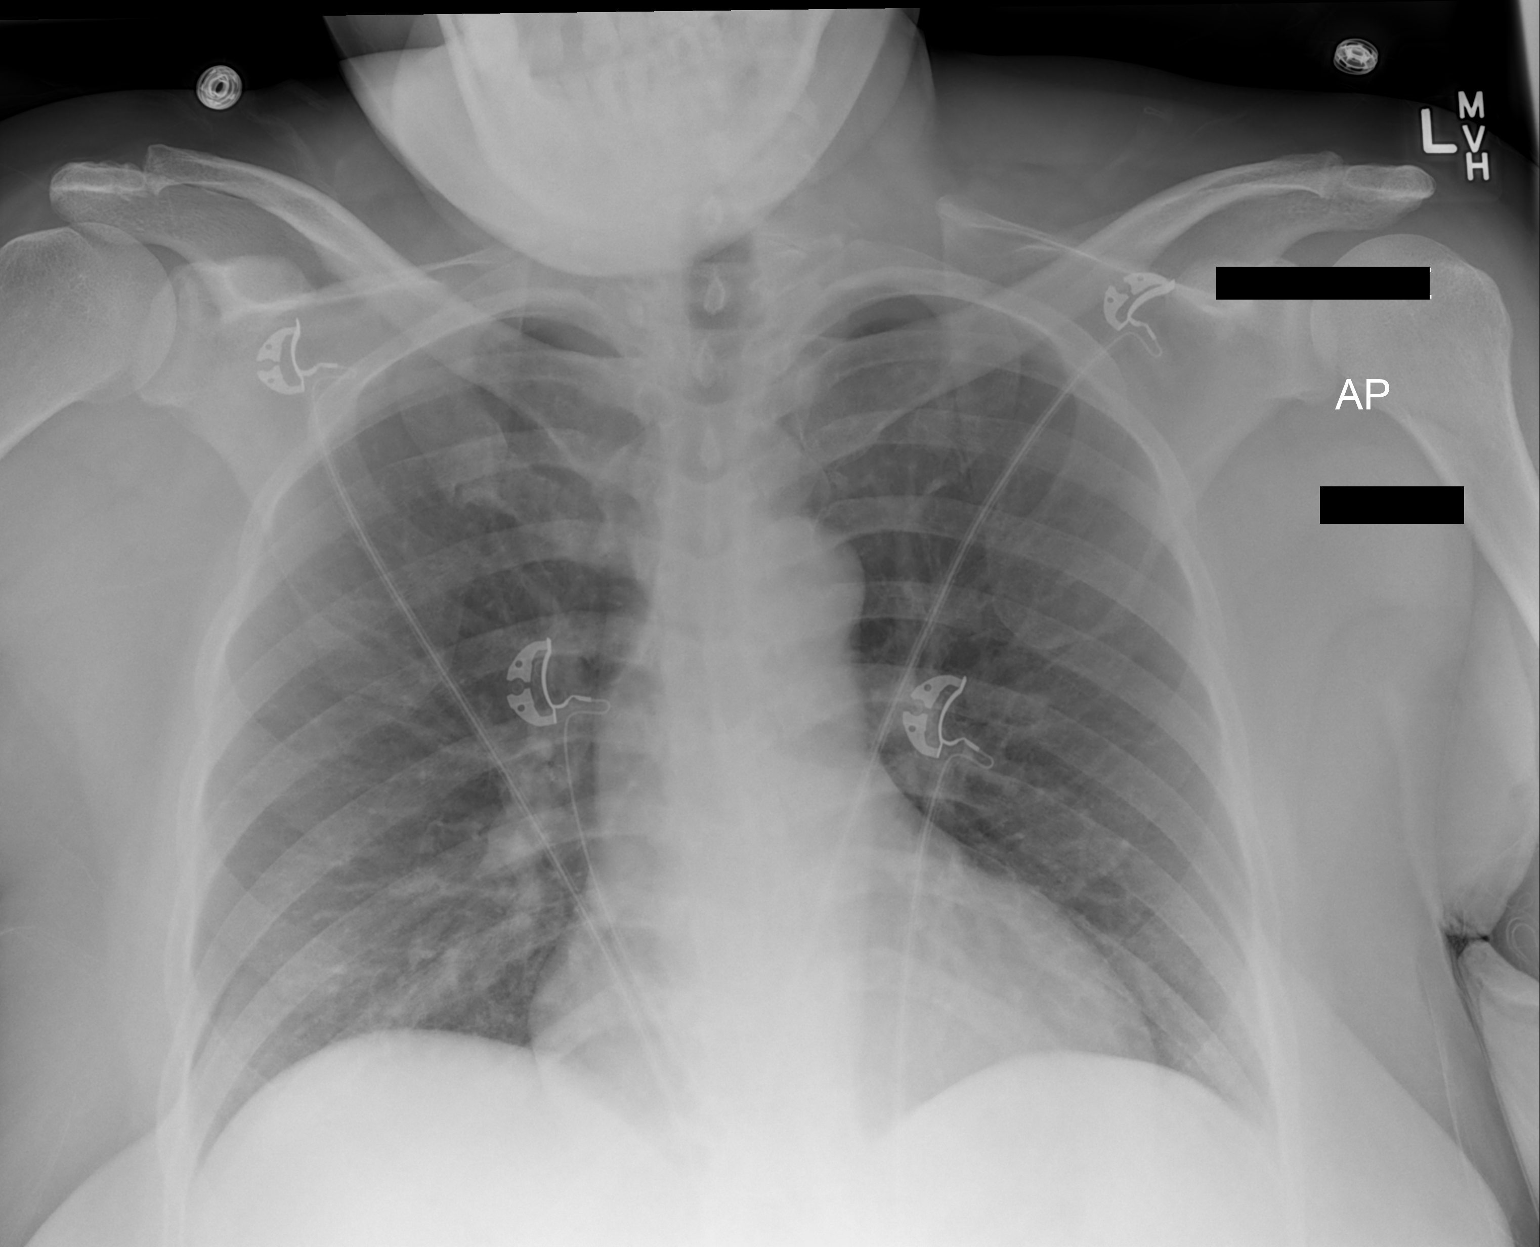

[1 of 1 positions shown; findings below may reference images not displayed]

FINDINGS: The heart size and mediastinal contours are within normal limits.
Both lungs are clear. The visualized skeletal structures are
unremarkable.
IMPRESSION: No active disease.

## 2014-03-11 ENCOUNTER — Ambulatory Visit (INDEPENDENT_AMBULATORY_CARE_PROVIDER_SITE_OTHER): Payer: BLUE CROSS/BLUE SHIELD | Admitting: Family Medicine

## 2014-03-11 ENCOUNTER — Encounter: Payer: Self-pay | Admitting: *Deleted

## 2014-03-11 ENCOUNTER — Encounter: Payer: Self-pay | Admitting: Family Medicine

## 2014-03-11 VITALS — BP 120/90 | HR 79 | Temp 98.8°F | Ht 70.0 in | Wt 257.5 lb

## 2014-03-11 DIAGNOSIS — J069 Acute upper respiratory infection, unspecified: Secondary | ICD-10-CM

## 2014-03-11 MED ORDER — HYDROCODONE-HOMATROPINE 5-1.5 MG/5ML PO SYRP: 5.0000 mL | ORAL_SOLUTION | Freq: Every evening | ORAL | Status: DC | PRN

## 2014-03-11 NOTE — Patient Instructions (Addendum)
INSTRUCTIONS FOR UPPER RESPIRATORY INFECTION:  -plenty of rest and fluids  -nasal saline wash 2-3 times daily (use prepackaged nasal saline or bottled/distilled water if making your own)   -flonase and claritin daily for 21 days incase allergies  -can use aftin nasal spray for drainage and nasal congestion for 4 days then stop - do NOT use longer then 3-4 days  -can use tylenol or ibuprofen as directed for aches and sorethroat  -in the winter time, using a humidifier at night is helpful (please follow cleaning instructions)  -if you are taking a cough medication - use only as directed, may also try a teaspoon of honey to coat the throat and throat lozenges  -for sore throat, salt water gargles can help  -follow up if you have fevers, facial pain, tooth pain, difficulty breathing or are worsening or not getting better in 5-7 days

## 2014-03-11 NOTE — Progress Notes (Signed)
Pre visit review using our clinic review tool, if applicable. No additional management support is needed unless otherwise documented below in the visit note. 

## 2014-03-11 NOTE — Progress Notes (Signed)
HPI:  -started: about 4 days ago -symptoms:nasal congestion, sore throat, cough, low grade temp, HA -denies:fever today, SOB, NVD, tooth pain, sinus pain -has tried: musinex and OTC cold medication -sick contacts/travel/risks: denies flu exposure, tick exposure or or Ebola risks -Hx of: allergies - had flare last week ROS: See pertinent positives and negatives per HPI.  Past Medical History  Diagnosis Date  . Allergy   . Depression   . GERD (gastroesophageal reflux disease)   . Arthritis   . Low back pain     Past Surgical History  Procedure Laterality Date  . Tubal ligation      Family History  Problem Relation Age of Onset  . Diabetes    . Hyperlipidemia    . Hypertension    . Cancer - Other Sister     History   Social History  . Marital Status: Divorced    Spouse Name: N/A    Number of Children: N/A  . Years of Education: N/A   Social History Main Topics  . Smoking status: Never Smoker   . Smokeless tobacco: None  . Alcohol Use: No  . Drug Use: No  . Sexual Activity: None   Other Topics Concern  . None   Social History Narrative     Current outpatient prescriptions:  .  bisoprolol-hydrochlorothiazide (ZIAC) 2.5-6.25 MG per tablet, TAKE 1 TABLET BY MOUTH DAILY., Disp: 90 tablet, Rfl: 0 .  HYDROcodone-acetaminophen (NORCO) 5-325 MG per tablet, Take 1 tablet by mouth every 4 (four) hours as needed., Disp: 15 tablet, Rfl: 0 .  meloxicam (MOBIC) 15 MG tablet, Take 15 mg by mouth daily as needed for pain., Disp: , Rfl:  .  pantoprazole (PROTONIX) 40 MG tablet, TAKE 1 TABLET (40 MG TOTAL) BY MOUTH DAILY., Disp: 30 tablet, Rfl: 9 .  HYDROcodone-homatropine (HYCODAN) 5-1.5 MG/5ML syrup, Take 5 mLs by mouth at bedtime as needed for cough., Disp: 120 mL, Rfl: 0  EXAM:  Filed Vitals:   03/11/14 1250  BP: 120/90  Pulse: 79  Temp: 98.8 F (37.1 C)    Body mass index is 36.95 kg/(m^2).  GENERAL: vitals reviewed and listed above, alert, oriented,  appears well hydrated and in no acute distress  HEENT: atraumatic, conjunttiva clear, no obvious abnormalities on inspection of external nose and ears, normal appearance of ear canals and TMs, clear nasal congestion, mild post oropharyngeal erythema with PND, no tonsillar edema or exudate, no sinus TTP  NECK: no obvious masses on inspection  LUNGS: clear to auscultation bilaterally, no wheezes, rales or rhonchi, good air movement  CV: HRRR, no peripheral edema  MS: moves all extremities without noticeable abnormality  PSYCH: pleasant and cooperative, no obvious depression or anxiety  ASSESSMENT AND PLAN:  Discussed the following assessment and plan:  Acute upper respiratory infection  -given HPI and exam findings today, a serious infection or illness is unlikely. We discussed potential etiologies, with VURI or allergic rhinitis being most likely, and advised supportive care and monitoring. We discussed treatment side effects, likely course, antibiotic misuse, transmission, and signs of developing a serious illness. -risks cough medication discussed -of course, we advised to return or notify a doctor immediately if symptoms worsen or persist or new concerns arise.    Patient Instructions  INSTRUCTIONS FOR UPPER RESPIRATORY INFECTION:  -plenty of rest and fluids  -nasal saline wash 2-3 times daily (use prepackaged nasal saline or bottled/distilled water if making your own)   -can use aftin nasal spray for drainage and  nasal congestion for 4 days then stop - do NOT use longer then 3-4 days  -can use tylenol or ibuprofen as directed for aches and sorethroat  -in the winter time, using a humidifier at night is helpful (please follow cleaning instructions)  -if you are taking a cough medication - use only as directed, may also try a teaspoon of honey to coat the throat and throat lozenges  -for sore throat, salt water gargles can help  -follow up if you have fevers, facial pain,  tooth pain, difficulty breathing or are worsening or not getting better in 5-7 days      Nikhil Osei, Jarrett Soho R.

## 2014-03-17 ENCOUNTER — Encounter: Payer: BC Managed Care – PPO | Admitting: Family

## 2014-03-18 ENCOUNTER — Other Ambulatory Visit (INDEPENDENT_AMBULATORY_CARE_PROVIDER_SITE_OTHER): Payer: BLUE CROSS/BLUE SHIELD

## 2014-03-18 ENCOUNTER — Other Ambulatory Visit: Payer: Self-pay | Admitting: Family

## 2014-03-18 DIAGNOSIS — Z0000 Encounter for general adult medical examination without abnormal findings: Secondary | ICD-10-CM | POA: Diagnosis not present

## 2014-03-18 LAB — LIPID PANEL
Cholesterol: 113 mg/dL (ref 0–200)
HDL: 34.5 mg/dL — ABNORMAL LOW (ref >39.00)
LDL Cholesterol: 59 mg/dL (ref 0–99)
NonHDL: 78.5
Total CHOL/HDL Ratio: 3
Triglycerides: 97 mg/dL (ref 0.0–149.0)
VLDL: 19.4 mg/dL (ref 0.0–40.0)

## 2014-03-18 LAB — COMPREHENSIVE METABOLIC PANEL
ALT: 21 U/L (ref 0–35)
AST: 21 U/L (ref 0–37)
Albumin: 3.7 g/dL (ref 3.5–5.2)
Alkaline Phosphatase: 54 U/L (ref 39–117)
BUN: 7 mg/dL (ref 6–23)
CO2: 24 mEq/L (ref 19–32)
Calcium: 8.9 mg/dL (ref 8.4–10.5)
Chloride: 106 mEq/L (ref 96–112)
Creatinine, Ser: 0.84 mg/dL (ref 0.40–1.20)
GFR: 94.06 mL/min (ref >60.00)
Glucose, Bld: 92 mg/dL (ref 70–99)
Potassium: 4.1 mEq/L (ref 3.5–5.1)
Sodium: 138 mEq/L (ref 135–145)
Total Bilirubin: 0.7 mg/dL (ref 0.2–1.2)
Total Protein: 6.4 g/dL (ref 6.0–8.3)

## 2014-03-18 LAB — CBC WITH DIFFERENTIAL/PLATELET
Basophils Absolute: 0 10*3/uL (ref 0.0–0.1)
Basophils Relative: 0.3 % (ref 0.0–3.0)
Eosinophils Absolute: 0.2 10*3/uL (ref 0.0–0.7)
Eosinophils Relative: 2.5 % (ref 0.0–5.0)
HCT: 44.5 % (ref 36.0–46.0)
Hemoglobin: 15.3 g/dL — ABNORMAL HIGH (ref 12.0–15.0)
Lymphocytes Relative: 33.2 % (ref 12.0–46.0)
Lymphs Abs: 3.1 10*3/uL (ref 0.7–4.0)
MCHC: 34.3 g/dL (ref 30.0–36.0)
MCV: 93.8 fl (ref 78.0–100.0)
Monocytes Absolute: 0.4 10*3/uL (ref 0.1–1.0)
Monocytes Relative: 4.6 % (ref 3.0–12.0)
Neutro Abs: 5.5 10*3/uL (ref 1.4–7.7)
Neutrophils Relative %: 59.4 % (ref 43.0–77.0)
Platelets: 240 10*3/uL (ref 150.0–400.0)
RBC: 4.74 Mil/uL (ref 3.87–5.11)
RDW: 13.9 % (ref 11.5–15.5)
WBC: 9.2 10*3/uL (ref 4.0–10.5)

## 2014-03-18 LAB — POCT URINALYSIS DIPSTICK
Bilirubin, UA: NEGATIVE
Blood, UA: NEGATIVE
Glucose, UA: NEGATIVE
Ketones, UA: NEGATIVE
Leukocytes, UA: NEGATIVE
Nitrite, UA: NEGATIVE
Protein, UA: NEGATIVE
Spec Grav, UA: 1.02
Urobilinogen, UA: 0.2
pH, UA: 7

## 2014-03-18 LAB — TSH: TSH: 1.14 u[IU]/mL (ref 0.35–4.50)

## 2014-03-19 ENCOUNTER — Ambulatory Visit (INDEPENDENT_AMBULATORY_CARE_PROVIDER_SITE_OTHER): Payer: BLUE CROSS/BLUE SHIELD | Admitting: Family

## 2014-03-19 ENCOUNTER — Other Ambulatory Visit (HOSPITAL_COMMUNITY)
Admission: RE | Admit: 2014-03-19 | Discharge: 2014-03-19 | Disposition: A | Discharge status: Home / Self Care | Payer: BLUE CROSS/BLUE SHIELD | Source: Ambulatory Visit | Attending: Family | Admitting: Family

## 2014-03-19 ENCOUNTER — Encounter: Payer: Self-pay | Admitting: Family

## 2014-03-19 VITALS — BP 122/74 | Temp 98.1°F | Ht 67.75 in | Wt 255.5 lb

## 2014-03-19 DIAGNOSIS — Z124 Encounter for screening for malignant neoplasm of cervix: Secondary | ICD-10-CM

## 2014-03-19 DIAGNOSIS — N760 Acute vaginitis: Secondary | ICD-10-CM | POA: Diagnosis present

## 2014-03-19 DIAGNOSIS — Z0000 Encounter for general adult medical examination without abnormal findings: Secondary | ICD-10-CM

## 2014-03-19 DIAGNOSIS — Z01419 Encounter for gynecological examination (general) (routine) without abnormal findings: Secondary | ICD-10-CM | POA: Diagnosis present

## 2014-03-19 DIAGNOSIS — Z113 Encounter for screening for infections with a predominantly sexual mode of transmission: Secondary | ICD-10-CM | POA: Insufficient documentation

## 2014-03-19 DIAGNOSIS — K219 Gastro-esophageal reflux disease without esophagitis: Secondary | ICD-10-CM

## 2014-03-19 DIAGNOSIS — I10 Essential (primary) hypertension: Secondary | ICD-10-CM

## 2014-03-19 MED ORDER — PAROXETINE HCL 10 MG PO TABS: 10.0000 mg | ORAL_TABLET | Freq: Every day | ORAL | Status: DC

## 2014-03-19 NOTE — Progress Notes (Signed)
Subjective:    Patient ID: Tonya Suarez, female    DOB: May 21, 1970, 44 y.o.   MRN: [id number]  HPI 44 year old African-American female, nonsmoker with a history of hypertension and GERD is in today for complete physical exam. . I reviewed all health maintenance protocols including mammography, colonoscopy, bone density Needed referrals were placed. Age and diagnosis  appropriate screening labs were ordered. Her immunization history was reviewed and appropriate vaccinations were ordered. Her current medications and allergies were reviewed and needed refills of her chronic medications were ordered. The plan for yearly health maintenance was discussed all orders and referrals were made as appropriate.Pt acknowledges that she has been depressed and more anxious since her sister passed away in 29-Jun-2022 and now with a sick father. Would like to try starting Paxil again, which has worked well for her in the past according to patient.  Denies fever, fatigue, chills and change in appetite. P   Review of Systems  Constitutional: Negative.   HENT: Negative.   Eyes: Negative.   Respiratory: Negative.   Cardiovascular: Negative.   Gastrointestinal: Negative.   Endocrine: Negative.   Genitourinary: Negative.   Musculoskeletal: Negative.   Skin: Negative.   Allergic/Immunologic: Negative.   Neurological: Negative.   Hematological: Negative.   Psychiatric/Behavioral: Negative.    Past Medical History  Diagnosis Date  . Allergy   . Depression   . GERD (gastroesophageal reflux disease)   . Arthritis   . Low back pain     History   Social History  . Marital Status: Divorced    Spouse Name: N/A    Number of Children: N/A  . Years of Education: N/A   Occupational History  . Not on file.   Social History Main Topics  . Smoking status: Never Smoker   . Smokeless tobacco: Not on file  . Alcohol Use: No  . Drug Use: No  . Sexual Activity: Not on file   Other Topics Concern  . Not on file     Social History Narrative    Past Surgical History  Procedure Laterality Date  . Tubal ligation      Family History  Problem Relation Age of Onset  . Diabetes    . Hyperlipidemia    . Hypertension    . Cancer - Other Sister     No Known Allergies  Current Outpatient Prescriptions on File Prior to Visit  Medication Sig Dispense Refill  . bisoprolol-hydrochlorothiazide (ZIAC) 2.5-6.25 MG per tablet TAKE 1 TABLET BY MOUTH DAILY. 90 tablet 0  . meloxicam (MOBIC) 15 MG tablet Take 15 mg by mouth daily as needed for pain.    . pantoprazole (PROTONIX) 40 MG tablet TAKE 1 TABLET (40 MG TOTAL) BY MOUTH DAILY. 30 tablet 9   No current facility-administered medications on file prior to visit.    BP 122/74 mmHg  Temp(Src) 98.1 F (36.7 C) (Oral)  Ht 5' 7.75" (1.721 m)  Wt 255 lb 8 oz (115.894 kg)  BMI 39.13 kg/m2chart    Objective:   Physical Exam  Constitutional: She is oriented to person, place, and time. She appears well-developed and well-nourished.  HENT:  Head: Normocephalic and atraumatic.  Right Ear: External ear normal.  Nose: Nose normal.  Mouth/Throat: Oropharynx is clear and moist.  Eyes: Conjunctivae and EOM are normal. Pupils are equal, round, and reactive to light.  Neck: Normal range of motion. Neck supple. No thyromegaly present.  Cardiovascular: Normal rate, regular rhythm and normal heart sounds.  Pulmonary/Chest: Effort normal and breath sounds normal.  Abdominal: Soft. Bowel sounds are normal.  Genitourinary: Vagina normal and uterus normal. Guaiac negative stool. No vaginal discharge found.  Musculoskeletal: Normal range of motion.  Neurological: She is alert and oriented to person, place, and time. She displays normal reflexes. No cranial nerve deficit. Coordination normal.  Skin: Skin is warm and dry.  Psychiatric: She has a normal mood and affect.          Assessment & Plan:  Encouraged healthy diet, exercise. Continue current medications.    - Start Paxil 10mg  po Qday for depression and anxiety.  Follow up in 3 weeks for recheck.

## 2014-03-19 NOTE — Patient Instructions (Signed)
Exercise to Lose Weight Exercise and a healthy diet may help you lose weight. Your doctor may suggest specific exercises. EXERCISE IDEAS AND TIPS  Choose low-cost things you enjoy doing, such as walking, bicycling, or exercising to workout videos.  Take stairs instead of the elevator.  Walk during your lunch break.  Park your car further away from work or school.  Go to a gym or an exercise class.  Start with 5 to 10 minutes of exercise each day. Build up to 30 minutes of exercise 4 to 6 days a week.  Wear shoes with good support and comfortable clothes.  Stretch before and after working out.  Work out until you breathe harder and your heart beats faster.  Drink extra water when you exercise.  Do not do so much that you hurt yourself, feel dizzy, or get very short of breath. Exercises that burn about 150 calories:  Running 1  miles in 15 minutes.  Playing volleyball for 45 to 60 minutes.  Washing and waxing a car for 45 to 60 minutes.  Playing touch football for 45 minutes.  Walking 1  miles in 35 minutes.  Pushing a stroller 1  miles in 30 minutes.  Playing basketball for 30 minutes.  Raking leaves for 30 minutes.  Bicycling 5 miles in 30 minutes.  Walking 2 miles in 30 minutes.  Dancing for 30 minutes.  Shoveling snow for 15 minutes.  Swimming laps for 20 minutes.  Walking up stairs for 15 minutes.  Bicycling 4 miles in 15 minutes.  Gardening for 30 to 45 minutes.  Jumping rope for 15 minutes.  Washing windows or floors for 45 to 60 minutes. Document Released: 03/12/2010 Document Revised: 05/02/2011 Document Reviewed: 03/12/2010 Exit[employer] Patient Information 2015 [employer]. This information is not intended to replace advice given to you by your health care provider. Make sure you discuss any questions you have with your health care provider.  

## 2014-03-19 NOTE — Progress Notes (Signed)
Pre visit review using our clinic review tool, if applicable. No additional management support is needed unless otherwise documented below in the visit note. 

## 2014-03-21 ENCOUNTER — Telehealth: Payer: Self-pay | Admitting: Family

## 2014-03-21 NOTE — Telephone Encounter (Signed)
emmi emailed °

## 2014-03-24 ENCOUNTER — Other Ambulatory Visit: Payer: Self-pay

## 2014-03-24 LAB — CERVICOVAGINAL ANCILLARY ONLY
Bacterial vaginitis: POSITIVE — AB
Candida vaginitis: NEGATIVE

## 2014-03-24 LAB — CYTOLOGY - PAP

## 2014-03-24 MED ORDER — METRONIDAZOLE 500 MG PO TABS: 500.0000 mg | ORAL_TABLET | Freq: Two times a day (BID) | ORAL | Status: DC

## 2014-05-10 ENCOUNTER — Other Ambulatory Visit: Payer: Self-pay | Admitting: Family

## 2014-06-05 ENCOUNTER — Other Ambulatory Visit: Payer: Self-pay | Admitting: *Deleted

## 2014-06-05 MED ORDER — PANTOPRAZOLE SODIUM 40 MG PO TBEC: DELAYED_RELEASE_TABLET | ORAL | Status: DC

## 2014-06-15 ENCOUNTER — Other Ambulatory Visit: Payer: Self-pay | Admitting: Family

## 2014-06-18 ENCOUNTER — Telehealth: Payer: Self-pay | Admitting: Family

## 2014-06-18 MED ORDER — PANTOPRAZOLE SODIUM 40 MG PO TBEC
DELAYED_RELEASE_TABLET | ORAL | Status: DC
Start: 2014-06-18 — End: 2016-09-16

## 2014-06-18 NOTE — Telephone Encounter (Signed)
Pt needs refill on pantoprazole #30 and bisoprolol hctz #90 W/refills call into [facility]

## 2014-06-18 NOTE — Telephone Encounter (Signed)
Done. Pt needs to schedule an appointment to establish care with another provider.

## 2014-08-21 ENCOUNTER — Other Ambulatory Visit: Payer: Self-pay | Admitting: Family

## 2014-08-29 ENCOUNTER — Ambulatory Visit (INDEPENDENT_AMBULATORY_CARE_PROVIDER_SITE_OTHER): Payer: BLUE CROSS/BLUE SHIELD | Admitting: Family

## 2014-08-29 ENCOUNTER — Encounter: Payer: Self-pay | Admitting: Family

## 2014-08-29 VITALS — BP 120/88 | HR 80 | Temp 98.7°F | Wt 247.0 lb

## 2014-08-29 DIAGNOSIS — M5442 Lumbago with sciatica, left side: Secondary | ICD-10-CM

## 2014-08-29 MED ORDER — HYDROCODONE-ACETAMINOPHEN 10-325 MG PO TABS: 1.0000 | ORAL_TABLET | Freq: Three times a day (TID) | ORAL | Status: DC | PRN

## 2014-08-29 MED ORDER — PREDNISONE 20 MG PO TABS: ORAL_TABLET | ORAL | Status: DC

## 2014-08-29 NOTE — Progress Notes (Signed)
Subjective:    Patient ID: Tonya Suarez, female    DOB: 1970/12/19, 44 y.o.   MRN: [id number]  HPI  44 year old female here today with complaints of left lower back pain radiates down left leg. Patient states that she has been having pain for 2 weeks now.  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   Is aggravated  by movement.  Patient states that she has bee taking Aleve and Aleve in addition for the pain, with some relief.  States that pain is 5/10 this morning.       Review of Systems  Constitutional: Negative.   HENT: Negative.   Eyes: Negative.   Respiratory: Negative.   Cardiovascular: Negative.   Gastrointestinal: Negative.   Endocrine: Negative.   Genitourinary: Negative.   Musculoskeletal: Positive for back pain.       Left lower   Skin: Negative.   Allergic/Immunologic: Negative.   Neurological: Negative.   Hematological: Negative.   Psychiatric/Behavioral: Negative.    Past Medical History  Diagnosis Date  . Allergy   . Depression   . GERD (gastroesophageal reflux disease)   . Arthritis   . Low back pain     History   Social History  . Marital Status: Divorced    Spouse Name: N/A  . Number of Children: N/A  . Years of Education: N/A   Occupational History  . Not on file.   Social History Main Topics  . Smoking status: Never Smoker   . Smokeless tobacco: Not on file  . Alcohol Use: No  . Drug Use: No  . Sexual Activity: Not on file   Other Topics Concern  . Not on file   Social History Narrative    Past Surgical History  Procedure Laterality Date  . Tubal ligation      Family History  Problem Relation Age of Onset  . Diabetes    . Hyperlipidemia    . Hypertension    . Cancer - Other Sister     No Known Allergies  Current Outpatient Prescriptions on File Prior to Visit  Medication Sig Dispense Refill  . bisoprolol-hydrochlorothiazide (ZIAC) 2.5-6.25 MG  per tablet TAKE 1 TABLET BY MOUTH DAILY. 90 tablet 1  . bisoprolol-hydrochlorothiazide (ZIAC) 2.5-6.25 MG per tablet TAKE 1 TABLET BY MOUTH DAILY. 90 tablet 1  . meloxicam (MOBIC) 15 MG tablet TAKE 1 TABLET BY MOUTH DAILY 30 tablet 1  . metroNIDAZOLE (FLAGYL) 500 MG tablet Take 1 tablet (500 mg total) by mouth 2 (two) times daily. 14 tablet 0  . pantoprazole (PROTONIX) 40 MG tablet TAKE 1 TABLET (40 MG TOTAL) BY MOUTH DAILY. 90 tablet 1  . PARoxetine (PAXIL) 10 MG tablet Take 1 tablet (10 mg total) by mouth daily. 30 tablet 1   No current facility-administered medications on file prior to visit.    BP 120/88 mmHg  Pulse 80  Temp(Src) 98.7 F (37.1 C) (Oral)  Wt 247 lb (112.038 kg)  LMP 08/15/2014 (Approximate)chart     Objective:   Physical Exam  Constitutional: She is oriented to person, place, and time. She appears well-developed and well-nourished.  HENT:  Head: Normocephalic.  Eyes: Pupils are equal, round, and reactive to light.  Neck: Normal range of motion. Neck supple.  Cardiovascular: Normal rate, regular rhythm, normal heart sounds and intact distal pulses.   Pulmonary/Chest: Effort normal and breath sounds normal.  Abdominal: Soft. Bowel sounds are normal.  Musculoskeletal: Normal range of motion.  Neurological: She is alert and oriented to person, place, and time. She has normal reflexes.  Skin: Skin is warm and dry.  Psychiatric: She has a normal mood and affect. Her behavior is normal. Judgment and thought content normal.  Nursing note and vitals reviewed.         Assessment & Plan:   .Diagnoses and all orders for this visit:  Left-sided low back pain with left-sided sciatica  Other orders -     predniSONE (DELTASONE) 20 MG tablet; 60mg  PO qam x 3 days, 40mg  po qam x 3 days, 20mg  qam x 3 days -     HYDROcodone-acetaminophen (NORCO) 10-325 MG per tablet; Take 1 tablet by mouth every 8 (eight) hours as needed.   Patient to complete taper and report back to  office if no relief.

## 2014-08-29 NOTE — Progress Notes (Signed)
Pre visit review using our clinic review tool, if applicable. No additional management support is needed unless otherwise documented below in the visit note. 

## 2014-08-29 NOTE — Patient Instructions (Signed)
Sciatica Sciatica is pain, weakness, numbness, or tingling along the path of the sciatic nerve. The nerve starts in the lower back and runs down the back of each leg. The nerve controls the muscles in the lower leg and in the back of the knee, while also providing sensation to the back of the thigh, lower leg, and the sole of your foot. Sciatica is a symptom of another medical condition. For instance, nerve damage or certain conditions, such as a herniated disk or bone spur on the spine, pinch or put pressure on the sciatic nerve. This causes the pain, weakness, or other sensations normally associated with sciatica. Generally, sciatica only affects one side of the body. CAUSES   Herniated or slipped disc.  Degenerative disk disease.  A pain disorder involving the narrow muscle in the buttocks (piriformis syndrome).  Pelvic injury or fracture.  Pregnancy.  Tumor (rare). SYMPTOMS  Symptoms can vary from mild to very severe. The symptoms usually travel from the low back to the buttocks and down the back of the leg. Symptoms can include:  Mild tingling or dull aches in the lower back, leg, or hip.  Numbness in the back of the calf or sole of the foot.  Burning sensations in the lower back, leg, or hip.  Sharp pains in the lower back, leg, or hip.  Leg weakness.  Severe back pain inhibiting movement. These symptoms may get worse with coughing, sneezing, laughing, or prolonged sitting or standing. Also, being overweight may worsen symptoms. DIAGNOSIS  Your caregiver will perform a physical exam to look for common symptoms of sciatica. He or she may ask you to do certain movements or activities that would trigger sciatic nerve pain. Other tests may be performed to find the cause of the sciatica. These may include:  Blood tests.  X-rays.  Imaging tests, such as an MRI or CT scan. TREATMENT  Treatment is directed at the cause of the sciatic pain. Sometimes, treatment is not necessary  and the pain and discomfort goes away on its own. If treatment is needed, your caregiver may suggest:  Over-the-counter medicines to relieve pain.  Prescription medicines, such as anti-inflammatory medicine, muscle relaxants, or narcotics.  Applying heat or ice to the painful area.  Steroid injections to lessen pain, irritation, and inflammation around the nerve.  Reducing activity during periods of pain.  Exercising and stretching to strengthen your abdomen and improve flexibility of your spine. Your caregiver may suggest losing weight if the extra weight makes the back pain worse.  Physical therapy.  Surgery to eliminate what is pressing or pinching the nerve, such as a bone spur or part of a herniated disk. HOME CARE INSTRUCTIONS   Only take over-the-counter or prescription medicines for pain or discomfort as directed by your caregiver.  Apply ice to the affected area for 20 minutes, 3-4 times a day for the first 48-72 hours. Then try heat in the same way.  Exercise, stretch, or perform your usual activities if these do not aggravate your pain.  Attend physical therapy sessions as directed by your caregiver.  Keep all follow-up appointments as directed by your caregiver.  Do not wear high heels or shoes that do not provide proper support.  Check your mattress to see if it is too soft. A firm mattress may lessen your pain and discomfort. SEEK IMMEDIATE MEDICAL CARE IF:   You lose control of your bowel or bladder (incontinence).  You have increasing weakness in the lower back, pelvis, buttocks,   or legs.  You have redness or swelling of your back.  You have a burning sensation when you urinate.  You have pain that gets worse when you lie down or awakens you at night.  Your pain is worse than you have experienced in the past.  Your pain is lasting longer than 4 weeks.  You are suddenly losing weight without reason. MAKE SURE YOU:  Understand these  instructions.  Will watch your condition.  Will get help right away if you are not doing well or get worse. Document Released: 02/01/2001 Document Revised: 08/09/2011 Document Reviewed: 06/19/2011 [employer] Patient Information 2015 [employer]. This information is not intended to replace advice given to you by your health care provider. Make sure you discuss any questions you have with your health care provider.  

## 2015-01-02 ENCOUNTER — Ambulatory Visit: Payer: BLUE CROSS/BLUE SHIELD | Admitting: Family Medicine

## 2015-01-12 ENCOUNTER — Ambulatory Visit (INDEPENDENT_AMBULATORY_CARE_PROVIDER_SITE_OTHER): Payer: BLUE CROSS/BLUE SHIELD | Admitting: Family Medicine

## 2015-01-12 ENCOUNTER — Encounter: Payer: Self-pay | Admitting: Family Medicine

## 2015-01-12 VITALS — BP 120/84 | HR 67 | Temp 98.5°F | Wt 259.0 lb

## 2015-01-12 DIAGNOSIS — F329 Major depressive disorder, single episode, unspecified: Secondary | ICD-10-CM

## 2015-01-12 DIAGNOSIS — M25531 Pain in right wrist: Secondary | ICD-10-CM

## 2015-01-12 DIAGNOSIS — F32A Depression, unspecified: Secondary | ICD-10-CM

## 2015-01-12 DIAGNOSIS — K219 Gastro-esophageal reflux disease without esophagitis: Secondary | ICD-10-CM

## 2015-01-12 DIAGNOSIS — M25532 Pain in left wrist: Secondary | ICD-10-CM

## 2015-01-12 DIAGNOSIS — I10 Essential (primary) hypertension: Secondary | ICD-10-CM | POA: Diagnosis not present

## 2015-01-12 NOTE — Patient Instructions (Addendum)
We will call you within a week about your referral to orthopedics. If you do not hear within 2 weeks, give [place] a call.   Nice to meet you!   I would want to see you at least once a year, but checking in 6 months is reasonable to keep an eye on blood pressure

## 2015-01-12 NOTE — Progress Notes (Signed)
Garret Reddish, MD  Subjective:  Tonya Suarez is a 44 y.o. year old very pleasant female patient who presents for/with See problem oriented charting ROS- No chest pain or shortness of breath. No headache or blurry vision.   Past Medical History-  Patient Active Problem List   Diagnosis Date Noted  . HTN (hypertension) 11/21/2011    Priority: Medium  . Depression 09/06/2006    Priority: Medium  . Family history of cancer of GI tract 12/06/2012    Priority: Low  . MENORRHAGIA 09/23/2009    Priority: Low  . PLANTAR FASCIITIS 09/20/2007    Priority: Low  . Osteoarthritis 09/19/2006    Priority: Low  . Allergic rhinitis 09/06/2006    Priority: Low  . GERD 09/06/2006    Priority: Low    Medications- reviewed and updated Current Outpatient Prescriptions  Medication Sig Dispense Refill  . bisoprolol-hydrochlorothiazide (ZIAC) 2.5-6.25 MG per tablet TAKE 1 TABLET BY MOUTH DAILY. 90 tablet 1  . pantoprazole (PROTONIX) 40 MG tablet TAKE 1 TABLET (40 MG TOTAL) BY MOUTH DAILY. 90 tablet 1  . PARoxetine (PAXIL) 10 MG tablet Take 1 tablet (10 mg total) by mouth daily. (Patient not taking: Reported on 01/12/2015) 30 tablet 1   No current facility-administered medications for this visit.    Objective: BP 120/84 mmHg  Pulse 67  Temp(Src) 98.5 F (36.9 C)  Wt 259 lb (117.482 kg) Gen: NAD, resting comfortably CV: RRR no murmurs rubs or gallops Lungs: CTAB no crackles, wheeze, rhonchi Abdomen: soft/nontender/nondistended/normal bowel sounds. No rebound or guarding.  Ext: no edema Skin: warm, dry Neuro: grossly normal, moves all extremities See below as well  Assessment/Plan:  HTN (hypertension) S: controlled. On Bisoprolol-hctz 2.5-6.25  BP Readings from Last 3 Encounters:  01/12/15 120/84  08/29/14 120/88  03/19/14 122/74  A/P:Continue current meds:  Doing well. No side effects   GERD S: well controlled on protonix. Recurrent symptoms when comes off A/P: continue  current rx. Encouraged need for healthy eating, regular exercise, weight loss.     Depression S: reasonable control but takes intermittently. Paxil 10mg . Depression just started back this year- had been on the past.  1st time while going through divorce, this time with loss of sister this year. A/P: discussed with patient not missing/skipping doses. Instead, stay on persistently then consider trial off in 6-12 months.     Ulnar side wrist pain  S:for a few months. Weakness due to pain causing her to drop things if lifting with palms facing up. At times in morning swollen especially over unlar side of wrist. No injury or fall. Pinky and ring finger feel numb in the morning and at times throughout the day.  O: no obvious swelling. Cannot reproduce pain today. No weakness in hand A/P: refer to orthopedics for further evaluation- unclear cause and exam unrevealing today. Patient asks about carpal tunnel but does not fit pattern.  6 months f/u- discussed need for weight loss and BP follow up Return precautions advised.   Orders Placed This Encounter  Procedures  . Ambulatory referral to Orthopedic Surgery    Referral Priority:  Routine    Referral Type:  Surgical    Referral Reason:  Specialty Services Required    Requested Specialty:  Orthopedic Surgery    Number of Visits Requested:  1    No orders of the defined types were placed in this encounter.

## 2015-01-12 NOTE — Assessment & Plan Note (Signed)
S: controlled. On Bisoprolol-hctz 2.5-6.25  BP Readings from Last 3 Encounters:  01/12/15 120/84  08/29/14 120/88  03/19/14 122/74  A/P:Continue current meds:  Doing well. No side effects

## 2015-01-12 NOTE — Assessment & Plan Note (Signed)
S: well controlled on protonix. Recurrent symptoms when comes off A/P: continue current rx. Encouraged need for healthy eating, regular exercise, weight loss.

## 2015-01-12 NOTE — Assessment & Plan Note (Signed)
S: reasonable control but takes intermittently. Paxil 10mg . Depression just started back this year- had been on the past.  1st time while going through divorce, this time with loss of sister this year. A/P: discussed with patient not missing/skipping doses. Instead, stay on persistently then consider trial off in 6-12 months.

## 2015-01-24 ENCOUNTER — Other Ambulatory Visit: Payer: Self-pay | Admitting: Family

## 2015-02-10 ENCOUNTER — Encounter: Payer: Self-pay | Admitting: Family Medicine

## 2015-02-10 ENCOUNTER — Ambulatory Visit (INDEPENDENT_AMBULATORY_CARE_PROVIDER_SITE_OTHER): Payer: BLUE CROSS/BLUE SHIELD | Admitting: Family Medicine

## 2015-02-10 VITALS — BP 130/80 | HR 81 | Temp 98.9°F | Wt 260.0 lb

## 2015-02-10 DIAGNOSIS — N951 Menopausal and female climacteric states: Secondary | ICD-10-CM | POA: Diagnosis not present

## 2015-02-10 DIAGNOSIS — N912 Amenorrhea, unspecified: Secondary | ICD-10-CM | POA: Diagnosis not present

## 2015-02-10 LAB — POCT URINE PREGNANCY: Preg Test, Ur: NEGATIVE

## 2015-02-10 NOTE — Patient Instructions (Signed)

## 2015-02-10 NOTE — Progress Notes (Signed)
Garret Reddish, MD  Subjective:  Tonya Suarez is a 44 y.o. year old very pleasant female patient who presents for/with See problem oriented charting ROS- no abdominal pain, vaginal discharge, unintentional weight loss. No night sweats.   Past Medical History-  Patient Active Problem List   Diagnosis Date Noted  . HTN (hypertension) 11/21/2011    Priority: Medium  . Depression 09/06/2006    Priority: Medium  . Family history of cancer of GI tract 12/06/2012    Priority: Low  . MENORRHAGIA 09/23/2009    Priority: Low  . PLANTAR FASCIITIS 09/20/2007    Priority: Low  . Osteoarthritis 09/19/2006    Priority: Low  . Allergic rhinitis 09/06/2006    Priority: Low  . GERD 09/06/2006    Priority: Low    Medications- reviewed and updated Current Outpatient Prescriptions  Medication Sig Dispense Refill  . bisoprolol-hydrochlorothiazide (ZIAC) 2.5-6.25 MG per tablet TAKE 1 TABLET BY MOUTH DAILY. 90 tablet 1  . pantoprazole (PROTONIX) 40 MG tablet TAKE 1 TABLET (40 MG TOTAL) BY MOUTH DAILY. 90 tablet 1  . PARoxetine (PAXIL) 10 MG tablet Take 1 tablet (10 mg total) by mouth daily. 30 tablet 1   No current facility-administered medications for this visit.    Objective: BP 130/80 mmHg  Pulse 81  Temp(Src) 98.9 F (37.2 C)  Wt 260 lb (117.935 kg) Gen: NAD, resting comfortably  Assessment/Plan:  Perimenopause S:Patient with history of regular every month periods. Over last 8 months, slight transition in this. Missed 1 period several months ago and now has not had one since late September. Wanted to make sure not pregnant and worried about menopause. She does have a bilateral tubal ligation. She has experienced hot flashes, vaginal dryness, decreased sex drive. No difficulties with sleep reported.  A/P: counseling provided today of what to expect with perimenopause and official diagnosis would be 12 months out from last period. Handout given. We discussed labwork likely tonot to  change diagnosis or be beneficial so we held off on [facility] and instead to follow clinically. Advised liberal vaginal lubricant with sexual contact. upreg negative.    Orders Placed This Encounter  Procedures  . POCT urine pregnancy   Results for orders placed or performed in visit on 02/10/15 (from the past 24 hour(s))  POCT urine pregnancy     Status: None   Collection Time: 02/10/15  3:01 PM  Result Value Ref Range   Preg Test, Ur Negative Negative   >50% of 15 minute office visit was spent on counseling (changes to expect, reason no labs needed, answering questions) and coordination of care

## 2015-02-19 ENCOUNTER — Other Ambulatory Visit: Payer: Self-pay | Admitting: Family

## 2015-02-19 NOTE — Telephone Encounter (Signed)
Patient is requesting a refill on bisoprolol-hydrochlorothiazide (ZIAC) 2.5-6.25 MG per tablet and pantoprazole (PROTONIX) 40 MG tablet  Pharmacy: [facility]

## 2015-03-20 ENCOUNTER — Encounter: Payer: BLUE CROSS/BLUE SHIELD | Admitting: Family Medicine

## 2015-04-01 ENCOUNTER — Encounter: Payer: BLUE CROSS/BLUE SHIELD | Admitting: Family Medicine

## 2015-04-23 ENCOUNTER — Other Ambulatory Visit (INDEPENDENT_AMBULATORY_CARE_PROVIDER_SITE_OTHER): Payer: BLUE CROSS/BLUE SHIELD

## 2015-04-23 DIAGNOSIS — Z0000 Encounter for general adult medical examination without abnormal findings: Secondary | ICD-10-CM

## 2015-04-23 LAB — POC URINALSYSI DIPSTICK (AUTOMATED)
Bilirubin, UA: NEGATIVE
Blood, UA: NEGATIVE
Glucose, UA: NEGATIVE
Ketones, UA: NEGATIVE
Nitrite, UA: NEGATIVE
Spec Grav, UA: 1.025
Urobilinogen, UA: 0.2
pH, UA: 6

## 2015-04-23 LAB — BASIC METABOLIC PANEL
BUN: 9 mg/dL (ref 6–23)
CO2: 26 mEq/L (ref 19–32)
Calcium: 9.1 mg/dL (ref 8.4–10.5)
Chloride: 106 mEq/L (ref 96–112)
Creatinine, Ser: 0.84 mg/dL (ref 0.40–1.20)
GFR: 93.61 mL/min (ref >60.00)
Glucose, Bld: 90 mg/dL (ref 70–99)
Potassium: 3.5 mEq/L (ref 3.5–5.1)
Sodium: 138 mEq/L (ref 135–145)

## 2015-04-23 LAB — CBC WITH DIFFERENTIAL/PLATELET
Basophils Absolute: 0 10*3/uL (ref 0.0–0.1)
Basophils Relative: 0.3 % (ref 0.0–3.0)
Eosinophils Absolute: 0.2 10*3/uL (ref 0.0–0.7)
Eosinophils Relative: 1.7 % (ref 0.0–5.0)
HCT: 42.6 % (ref 36.0–46.0)
Hemoglobin: 14.6 g/dL (ref 12.0–15.0)
Lymphocytes Relative: 33 % (ref 12.0–46.0)
Lymphs Abs: 3.2 10*3/uL (ref 0.7–4.0)
MCHC: 34.3 g/dL (ref 30.0–36.0)
MCV: 92.5 fl (ref 78.0–100.0)
Monocytes Absolute: 0.4 10*3/uL (ref 0.1–1.0)
Monocytes Relative: 3.8 % (ref 3.0–12.0)
Neutro Abs: 5.9 10*3/uL (ref 1.4–7.7)
Neutrophils Relative %: 61.2 % (ref 43.0–77.0)
Platelets: 235 10*3/uL (ref 150.0–400.0)
RBC: 4.6 Mil/uL (ref 3.87–5.11)
RDW: 13.8 % (ref 11.5–15.5)
WBC: 9.6 10*3/uL (ref 4.0–10.5)

## 2015-04-23 LAB — HEPATIC FUNCTION PANEL
ALT: 24 U/L (ref 0–35)
AST: 23 U/L (ref 0–37)
Albumin: 3.9 g/dL (ref 3.5–5.2)
Alkaline Phosphatase: 50 U/L (ref 39–117)
Bilirubin, Direct: 0.1 mg/dL (ref 0.0–0.3)
Total Bilirubin: 0.8 mg/dL (ref 0.2–1.2)
Total Protein: 6.3 g/dL (ref 6.0–8.3)

## 2015-04-23 LAB — LIPID PANEL
Cholesterol: 128 mg/dL (ref 0–200)
HDL: 36.2 mg/dL — ABNORMAL LOW (ref >39.00)
LDL Cholesterol: 72 mg/dL (ref 0–99)
NonHDL: 91.33
Total CHOL/HDL Ratio: 4
Triglycerides: 95 mg/dL (ref 0.0–149.0)
VLDL: 19 mg/dL (ref 0.0–40.0)

## 2015-04-23 LAB — TSH: TSH: 1.2 u[IU]/mL (ref 0.35–4.50)

## 2015-04-30 ENCOUNTER — Other Ambulatory Visit (HOSPITAL_COMMUNITY)
Admission: RE | Admit: 2015-04-30 | Discharge: 2015-04-30 | Disposition: A | Discharge status: Home / Self Care | Payer: BLUE CROSS/BLUE SHIELD | Source: Ambulatory Visit | Attending: Family Medicine | Admitting: Family Medicine

## 2015-04-30 ENCOUNTER — Ambulatory Visit (INDEPENDENT_AMBULATORY_CARE_PROVIDER_SITE_OTHER): Payer: BLUE CROSS/BLUE SHIELD | Admitting: Family Medicine

## 2015-04-30 ENCOUNTER — Encounter: Payer: Self-pay | Admitting: Family Medicine

## 2015-04-30 VITALS — BP 130/80 | HR 80 | Temp 98.8°F | Ht 70.0 in | Wt 255.0 lb

## 2015-04-30 DIAGNOSIS — N898 Other specified noninflammatory disorders of vagina: Secondary | ICD-10-CM | POA: Diagnosis not present

## 2015-04-30 DIAGNOSIS — Z800 Family history of malignant neoplasm of digestive organs: Secondary | ICD-10-CM

## 2015-04-30 DIAGNOSIS — Z0001 Encounter for general adult medical examination with abnormal findings: Secondary | ICD-10-CM | POA: Diagnosis not present

## 2015-04-30 DIAGNOSIS — Z1211 Encounter for screening for malignant neoplasm of colon: Secondary | ICD-10-CM

## 2015-04-30 DIAGNOSIS — Z7251 High risk heterosexual behavior: Secondary | ICD-10-CM | POA: Diagnosis not present

## 2015-04-30 DIAGNOSIS — Z113 Encounter for screening for infections with a predominantly sexual mode of transmission: Secondary | ICD-10-CM | POA: Diagnosis present

## 2015-04-30 DIAGNOSIS — R300 Dysuria: Secondary | ICD-10-CM | POA: Diagnosis not present

## 2015-04-30 DIAGNOSIS — R6889 Other general symptoms and signs: Secondary | ICD-10-CM

## 2015-04-30 DIAGNOSIS — N760 Acute vaginitis: Secondary | ICD-10-CM | POA: Insufficient documentation

## 2015-04-30 MED ORDER — MELOXICAM 7.5 MG PO TABS: 7.5000 mg | ORAL_TABLET | Freq: Every day | ORAL | Status: DC | PRN

## 2015-04-30 NOTE — Patient Instructions (Addendum)
Please drop off a urine before you leave- let's make sure no infection.   Also leave bloodwork  No changes in medicine Blood pressure looks great on ziac. Reflux controlled with pantoprazole. Depression controlled with paxil.

## 2015-04-30 NOTE — Addendum Note (Signed)
Addended by: Clyde Lundborg A on: 04/30/2015 09:38 AM   Modules accepted: Orders

## 2015-04-30 NOTE — Addendum Note (Signed)
Addended by: Clyde Lundborg A on: 04/30/2015 10:52 AM   Modules accepted: Orders

## 2015-04-30 NOTE — Progress Notes (Signed)
Tonya Reddish, MD Phone: [phone]  Subjective:  Patient presents today for their annual physical. Chief complaint-noted.   See problem oriented charting- ROS- full  review of systems was completed and negative except for: mild dysuria and mild vaginal discharge  The following were reviewed and entered/updated in epic: Past Medical History  Diagnosis Date  . Allergy   . Depression   . GERD (gastroesophageal reflux disease)   . Arthritis   . Low back pain    Patient Active Problem List   Diagnosis Date Noted  . HTN (hypertension) 11/21/2011    Priority: Medium  . Depression 09/06/2006    Priority: Medium  . Family history of cancer of GI tract 12/06/2012    Priority: Low  . MENORRHAGIA 09/23/2009    Priority: Low  . PLANTAR FASCIITIS 09/20/2007    Priority: Low  . Osteoarthritis 09/19/2006    Priority: Low  . Allergic rhinitis 09/06/2006    Priority: Low  . GERD 09/06/2006    Priority: Low   Past Surgical History  Procedure Laterality Date  . Tubal ligation      Family History  Problem Relation Age of Onset  . Diabetes Father     mother  . Hyperlipidemia Father     mother  . Hypertension Father     mother  . Cancer - Other Sister     duodenal  . Colon cancer Father     16s, and grandmother    Medications- reviewed and updated Current Outpatient Prescriptions  Medication Sig Dispense Refill  . bisoprolol-hydrochlorothiazide (ZIAC) 2.5-6.25 MG per tablet TAKE 1 TABLET BY MOUTH DAILY. 90 tablet 1  . pantoprazole (PROTONIX) 40 MG tablet TAKE 1 TABLET (40 MG TOTAL) BY MOUTH DAILY. 90 tablet 1  . PARoxetine (PAXIL) 10 MG tablet Take 1 tablet (10 mg total) by mouth daily. 30 tablet 1   No current facility-administered medications for this visit.    Allergies-reviewed and updated No Known Allergies  Social History   Social History  . Marital Status: Divorced    Spouse Name: N/A  . Number of Children: N/A  . Years of Education: N/A   Social  History Main Topics  . Smoking status: Never Smoker   . Smokeless tobacco: None  . Alcohol Use: 0.0 - 1.2 oz/week    0-2 Standard drinks or equivalent per week  . Drug Use: No  . Sexual Activity: Not Asked   Other Topics Concern  . None   Social History Narrative   Engaged with plans for 2017 (May 13th 2017) marriage. 2 children- son 52 (married) and daughter 8. No grandkids.       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 Oropharynx normal Neck: no thyromegaly CV: RRR no murmurs rubs or gallops Lungs: CTAB no crackles, wheeze, rhonchi Abdomen: soft/nontender/nondistended/normal bowel sounds. No rebound or guarding.  Ext: no edema Skin: warm, dry Neuro: grossly normal, moves all extremities, PERRLA  Breasts: normal appearance, no masses or tenderness Pelvic: cervix normal in appearance, external genitalia normal, no adnexal masses or tenderness, no cervical motion tenderness, uterus normal size, shape, and consistency and whitish to yellow discharge  Assessment/Plan:  45 y.o. female presenting for annual physical.  Health Maintenance  counseling: 1. Anticipatory guidance: Patient counseled regarding regular dental exams, eye exams, wearing seatbelts.  2. Risk factor reduction:  Advised patient of need for regular exercise (walking a mile a day most days of the week) and diet rich and fruits and vegetables to reduce risk of heart attack and stroke.  Wt Readings from Last 3 Encounters:  04/30/15 255 lb (115.667 kg)  02/10/15 260 lb (117.935 kg)  01/12/15 259 lb (117.482 kg)  3. Immunizations/screenings/ancillary studies Immunization History  Administered Date(s) Administered  . Influenza Split 11/21/2011  . Influenza Whole 11/19/2007,  12/03/2008  . Influenza,inj,Quad PF,36+ Mos 11/21/2012  . Influenza-Unspecified 12/17/2014  . Td 02/21/2001  . Tdap 11/21/2011   Health Maintenance Due  Topic Date Due  . HIV Screening - screen today 08/28/1983   4. Cervical cancer screening- normal pap smear 03/19/14 with 3 year repeat with no history abnormal 5. Breast cancer screening-  breast exam today  and mammogram advised 6. Colon cancer screening - dad with colon cancer. Last exam 2005- told every 5-10 years   Mild dysuria for a month- get urine culture Mild vaginal discharge - STD screen and yeast and BV check  Blood pressure looks great on ziac. Reflux controlled with pantoprazole. Depression controlled with paxil.   Return in about 1 year (around 04/29/2016) for physical. Return precautions advised.

## 2015-05-01 LAB — CERVICOVAGINAL ANCILLARY ONLY
Chlamydia: NEGATIVE
Neisseria Gonorrhea: NEGATIVE
Trichomonas: NEGATIVE

## 2015-05-01 LAB — HIV ANTIBODY (ROUTINE TESTING W REFLEX): HIV 1&2 Ab, 4th Generation: NONREACTIVE

## 2015-05-01 LAB — RPR

## 2015-05-02 LAB — URINE CULTURE: Colony Count: 3000

## 2015-05-04 ENCOUNTER — Other Ambulatory Visit: Payer: Self-pay | Admitting: Family Medicine

## 2015-05-04 LAB — CERVICOVAGINAL ANCILLARY ONLY: Candida vaginitis: NEGATIVE

## 2015-05-04 MED ORDER — METRONIDAZOLE 500 MG PO TABS: 500.0000 mg | ORAL_TABLET | Freq: Three times a day (TID) | ORAL | Status: DC

## 2015-06-10 ENCOUNTER — Other Ambulatory Visit: Payer: Self-pay | Admitting: Family

## 2015-08-10 ENCOUNTER — Encounter: Payer: Self-pay | Admitting: Family Medicine

## 2015-08-10 ENCOUNTER — Telehealth: Payer: Self-pay | Admitting: Family Medicine

## 2015-08-10 ENCOUNTER — Ambulatory Visit (INDEPENDENT_AMBULATORY_CARE_PROVIDER_SITE_OTHER): Payer: BLUE CROSS/BLUE SHIELD | Admitting: Family Medicine

## 2015-08-10 VITALS — BP 136/82 | HR 60 | Temp 98.1°F | Ht 70.0 in | Wt 256.0 lb

## 2015-08-10 DIAGNOSIS — R51 Headache: Secondary | ICD-10-CM | POA: Diagnosis not present

## 2015-08-10 DIAGNOSIS — R519 Headache, unspecified: Secondary | ICD-10-CM

## 2015-08-10 MED ORDER — KETOROLAC TROMETHAMINE 60 MG/2ML IM SOLN
60.0000 mg | Freq: Once | INTRAMUSCULAR | Status: AC
  Administered 2015-08-10: 60 mg via INTRAMUSCULAR

## 2015-08-10 NOTE — Telephone Encounter (Signed)
Pt has been scheduled.  °

## 2015-08-10 NOTE — Telephone Encounter (Signed)
Pt has had HA greater than a week on right side of head.  Pt not sure if sinus, but her bp was elevated on Saturday. Pt needs appointment after 3:30. Ok to use same day Tuesday?

## 2015-08-10 NOTE — Patient Instructions (Signed)
Toradol shot today for prolonged headache pattern. Normal neurological exam.   If symptoms get better but come back- come see me Thursday. Try to take it lighter on excedrin for a few days, get lots of rest, use heat on the neck as well as massage as best as possible

## 2015-08-10 NOTE — Telephone Encounter (Signed)
Please see message and advise 

## 2015-08-10 NOTE — Progress Notes (Signed)
Subjective:  Tonya Suarez is a 45 y.o. year old very pleasant female patient who presents for/with See problem oriented charting ROS- No facial or extremity weakness. No slurred words or trouble swallowing. no blurry vision or double vision. No paresthesias. No confusion or word finding difficulties. Marland Kitchensee any ROS included in HPI as well.   Past Medical History-  Patient Active Problem List   Diagnosis Date Noted  . HTN (hypertension) 11/21/2011    Priority: Medium  . Depression 09/06/2006    Priority: Medium  . Family history of cancer of GI tract 12/06/2012    Priority: Low  . MENORRHAGIA 09/23/2009    Priority: Low  . PLANTAR FASCIITIS 09/20/2007    Priority: Low  . Osteoarthritis 09/19/2006    Priority: Low  . Allergic rhinitis 09/06/2006    Priority: Low  . GERD 09/06/2006    Priority: Low    Medications- reviewed and updated Current Outpatient Prescriptions  Medication Sig Dispense Refill  . bisoprolol-hydrochlorothiazide (ZIAC) 2.5-6.25 MG per tablet TAKE 1 TABLET BY MOUTH DAILY. 90 tablet 1  . meloxicam (MOBIC) 7.5 MG tablet Take 1 tablet (7.5 mg total) by mouth daily as needed for pain. 30 tablet 5  . pantoprazole (PROTONIX) 40 MG tablet TAKE 1 TABLET (40 MG TOTAL) BY MOUTH DAILY. 90 tablet 1  . PARoxetine (PAXIL) 10 MG tablet Take 1 tablet (10 mg total) by mouth daily. 30 tablet 1   No current facility-administered medications for this visit.    Objective: BP 136/82 mmHg  Pulse 60  Temp(Src) 98.1 F (36.7 C) (Oral)  Ht 5\' 10"  (1.778 m)  Wt 256 lb (116.121 kg)  BMI 36.73 kg/m2  LMP 08/04/2015 Gen: NAD, resting comfortably TM normal, oropharynx normal, nares normal, no sinus tenderness CV: RRR no murmurs rubs or gallops Lungs: CTAB no crackles, wheeze, rhonchi Abdomen: soft/nontender/nondistended/normal bowel sounds. Ext: no edema Skin: warm, dry, no rash Neuro: grossly normal, moves all extremities   Assessment/Plan:  Headache S: 1 week duration.  Right side of head behind eye and then back to behind ear- thought maybe sinuses. Friday it was lingering somewhat, had been on and off most of day. Migraines in past if she took excedrin migraine and something for sinuses it would help, but did not help. History of headaches - last time she had to get a shot for migraines 2007 or 2008. Pain more sharp- occasional throbbing.  Neck massage seemed to help and then heating pad seemed to help the most. Worst up to 8/10, today up to 6/10. Blood pressure Saturday 132/108- improved today A/P: HA has some features of tension (pain in neck which when that is relieved improves headache) but also migraines (one sided, over 4 hours, pulsatile at times).  Reassuring neuro exam. Will trial toradol shot today and advised avoid excedrin for next few days increase rebound headache portion- return on Thursday for repeat injection and potential short term steroid to help prevent rebound if recurs after injection today. Pt concerned about sinuses but this seems to be minor contributing factor  Discussed no mobic within 24 hours of shot today  Meds ordered this encounter  Medications  . ketorolac (TORADOL) injection 60 mg    Sig:     Return precautions advised.  Garret Reddish, MD

## 2015-08-10 NOTE — Telephone Encounter (Signed)
Can you double book her at 4:15 today? I ended up seeing that patient this AM so I have a spot then

## 2015-08-13 ENCOUNTER — Other Ambulatory Visit: Payer: Self-pay

## 2015-08-13 ENCOUNTER — Ambulatory Visit (INDEPENDENT_AMBULATORY_CARE_PROVIDER_SITE_OTHER): Payer: BLUE CROSS/BLUE SHIELD | Admitting: Family Medicine

## 2015-08-13 ENCOUNTER — Encounter: Payer: Self-pay | Admitting: Family Medicine

## 2015-08-13 VITALS — BP 142/102 | HR 82 | Temp 98.3°F | Ht 70.0 in | Wt 256.0 lb

## 2015-08-13 DIAGNOSIS — R519 Headache, unspecified: Secondary | ICD-10-CM

## 2015-08-13 DIAGNOSIS — I10 Essential (primary) hypertension: Secondary | ICD-10-CM | POA: Diagnosis not present

## 2015-08-13 DIAGNOSIS — R51 Headache: Secondary | ICD-10-CM | POA: Diagnosis not present

## 2015-08-13 MED ORDER — PREDNISONE 20 MG PO TABS: ORAL_TABLET | ORAL | Status: DC

## 2015-08-13 MED ORDER — KETOROLAC TROMETHAMINE 60 MG/2ML IM SOLN
60.0000 mg | Freq: Once | INTRAMUSCULAR | Status: AC
  Administered 2015-08-13: 60 mg via INTRAMUSCULAR

## 2015-08-13 NOTE — Progress Notes (Signed)
Subjective:  Tonya Suarez is a 45 y.o. year old very pleasant female patient who presents for/with See problem oriented charting ROS- No facial or extremity weakness. No slurred words or trouble swallowing. no blurry vision or double vision. No paresthesias. No confusion or word finding difficulties. No chest pain or shortness of breath.see any ROS included in HPI as well.   Past Medical History-  Patient Active Problem List   Diagnosis Date Noted  . HTN (hypertension) 11/21/2011    Priority: Medium  . Depression 09/06/2006    Priority: Medium  . Family history of cancer of GI tract 12/06/2012    Priority: Low  . MENORRHAGIA 09/23/2009    Priority: Low  . PLANTAR FASCIITIS 09/20/2007    Priority: Low  . Osteoarthritis 09/19/2006    Priority: Low  . Allergic rhinitis 09/06/2006    Priority: Low  . GERD 09/06/2006    Priority: Low    Medications- reviewed and updated Current Outpatient Prescriptions  Medication Sig Dispense Refill  . bisoprolol-hydrochlorothiazide (ZIAC) 2.5-6.25 MG per tablet TAKE 1 TABLET BY MOUTH DAILY. 90 tablet 1  . meloxicam (MOBIC) 7.5 MG tablet Take 1 tablet (7.5 mg total) by mouth daily as needed for pain. 30 tablet 5  . pantoprazole (PROTONIX) 40 MG tablet TAKE 1 TABLET (40 MG TOTAL) BY MOUTH DAILY. 90 tablet 1  . PARoxetine (PAXIL) 10 MG tablet Take 1 tablet (10 mg total) by mouth daily. 30 tablet 1  . predniSONE (DELTASONE) 20 MG tablet Take 2 pills for 2 days, then pill daily for 5 days 7 tablet 0   No current facility-administered medications for this visit.    Objective: BP 142/102 mmHg  Pulse 82  Temp(Src) 98.3 F (36.8 C) (Oral)  Ht '5\' 10"'$  (1.778 m)  Wt 256 lb (116.121 kg)  BMI 36.73 kg/m2  SpO2 90%  LMP 08/04/2015 Gen: NAD, resting comfortably CV: RRR no murmurs rubs or gallops Lungs: CTAB no crackles, wheeze, rhonchi Ext: no edema Skin: warm, dry Neuro:  Remains unchanged- benign exam  Assessment/Plan:  Acute intractable  headache, unspecified headache type - Plan: ketorolac (TORADOL) injection 60 mg S: Patient returns for recurrent headaches. Seen on  Monday- after toradol no pain that evening. Woke up Tuesday morning with pain again, reduced by noon but persisted. Wednesday morning it was gone then back by 11 am-Stayed all day. Today pain level has been 4/10 most of the day. She feels more fatigued at work. No new symptoms other htan headache- same location right side of head going down behind eye. Once again- not atypical for her baseline HA just more persistent A/P: we will trial another toradol shot then have patien tstart prednisone for 7 days to try to prevent rebound. If recurs- we discussed neuro referral vs. ER trip for different ER cocktail for HA- we do not have other options in clinic really. For her hypertension- slightly high and was hesitant to trial toradol but will give then patient will take extra dose of bisoprolol-hctz 2.5-6.25 this evening with dinner. Could probably get CT quicker but I have very very low suspicion for bleed. Consider MRI but would be prolonged- may be able to get into neurology by then. Finally, potentially could get ESR, CRP if returns to this office though I do not have high suspicion for temporal arteritis at her age.   Meds ordered this encounter  Medications  . predniSONE (DELTASONE) 20 MG tablet    Sig: Take 2 pills for 2 days, then  pill daily for 5 days    Dispense:  7 tablet    Refill:  0  . ketorolac (TORADOL) injection 60 mg    Sig:    Return precautions advised.  Garret Reddish, MD

## 2015-08-13 NOTE — Patient Instructions (Addendum)
Toradol shot again today  Take prednisone for 7 days to try to prevent rebound headache  If you have headache again during this time frame that is severe- we can try toradol again if during week. If weekend and severe headache- can seek care with emergency room- they have some alternate cocktails they could use for intractable headaches

## 2015-08-13 NOTE — Progress Notes (Signed)
Pre visit review using our clinic review tool, if applicable. No additional management support is needed unless otherwise documented below in the visit note. 

## 2015-08-14 ENCOUNTER — Encounter (HOSPITAL_COMMUNITY): Payer: Self-pay | Admitting: Emergency Medicine

## 2015-08-14 ENCOUNTER — Emergency Department (HOSPITAL_COMMUNITY)
Admission: EM | Admit: 2015-08-14 | Discharge: 2015-08-14 | Disposition: A | Discharge status: Home / Self Care | Payer: BLUE CROSS/BLUE SHIELD | Attending: Emergency Medicine | Admitting: Emergency Medicine

## 2015-08-14 ENCOUNTER — Emergency Department (HOSPITAL_COMMUNITY): Payer: BLUE CROSS/BLUE SHIELD

## 2015-08-14 DIAGNOSIS — R51 Headache: Secondary | ICD-10-CM | POA: Insufficient documentation

## 2015-08-14 DIAGNOSIS — R519 Headache, unspecified: Secondary | ICD-10-CM

## 2015-08-14 LAB — CBC WITH DIFFERENTIAL/PLATELET
Basophils Absolute: 0 10*3/uL (ref 0.0–0.1)
Basophils Relative: 0 %
Eosinophils Absolute: 0.3 10*3/uL (ref 0.0–0.7)
Eosinophils Relative: 3 %
HCT: 40.4 % (ref 36.0–46.0)
Hemoglobin: 14.5 g/dL (ref 12.0–15.0)
Lymphocytes Relative: 36 %
Lymphs Abs: 3.4 10*3/uL (ref 0.7–4.0)
MCH: 30.2 pg (ref 26.0–34.0)
MCHC: 35.9 g/dL (ref 30.0–36.0)
MCV: 84.2 fL (ref 78.0–100.0)
Monocytes Absolute: 0.4 10*3/uL (ref 0.1–1.0)
Monocytes Relative: 4 %
Neutro Abs: 5.4 10*3/uL (ref 1.7–7.7)
Neutrophils Relative %: 57 %
Platelets: 238 10*3/uL (ref 150–400)
RBC: 4.8 MIL/uL (ref 3.87–5.11)
RDW: 13.2 % (ref 11.5–15.5)
WBC: 9.5 10*3/uL (ref 4.0–10.5)

## 2015-08-14 LAB — BASIC METABOLIC PANEL
Anion gap: 5 (ref 5–15)
BUN: 9 mg/dL (ref 6–20)
CO2: 26 mmol/L (ref 22–32)
Calcium: 9.3 mg/dL (ref 8.9–10.3)
Chloride: 106 mmol/L (ref 101–111)
Creatinine, Ser: 1 mg/dL (ref 0.44–1.00)
GFR calc Af Amer: >60 mL/min (ref >60)
GFR calc non Af Amer: >60 mL/min (ref >60)
Glucose, Bld: 81 mg/dL (ref 65–99)
Potassium: 3.8 mmol/L (ref 3.5–5.1)
Sodium: 137 mmol/L (ref 135–145)

## 2015-08-14 MED ORDER — DEXAMETHASONE SODIUM PHOSPHATE 10 MG/ML IJ SOLN
10.0000 mg | Freq: Once | INTRAMUSCULAR | Status: AC
  Administered 2015-08-14: 10 mg via INTRAVENOUS
  Filled 2015-08-14: qty 1

## 2015-08-14 MED ORDER — DIPHENHYDRAMINE HCL 50 MG/ML IJ SOLN
25.0000 mg | Freq: Once | INTRAMUSCULAR | Status: AC
  Administered 2015-08-14: 25 mg via INTRAVENOUS
  Filled 2015-08-14: qty 1

## 2015-08-14 MED ORDER — METOCLOPRAMIDE HCL 5 MG/ML IJ SOLN
10.0000 mg | Freq: Once | INTRAMUSCULAR | Status: AC
  Administered 2015-08-14: 10 mg via INTRAVENOUS
  Filled 2015-08-14: qty 2

## 2015-08-14 MED ORDER — SODIUM CHLORIDE 0.9 % IV BOLUS (SEPSIS)
1000.0000 mL | Freq: Once | INTRAVENOUS | Status: AC
  Administered 2015-08-14: 1000 mL via INTRAVENOUS

## 2015-08-14 MED ORDER — KETOROLAC TROMETHAMINE 15 MG/ML IJ SOLN
15.0000 mg | Freq: Once | INTRAMUSCULAR | Status: AC
  Administered 2015-08-14: 15 mg via INTRAVENOUS
  Filled 2015-08-14: qty 1

## 2015-08-14 MED ORDER — BUTALBITAL-APAP-CAFFEINE 50-325-40 MG PO TABS: 1.0000 | ORAL_TABLET | Freq: Four times a day (QID) | ORAL | Status: DC | PRN

## 2015-08-14 NOTE — ED Notes (Signed)
Pt here with headache. Pt sts she has been having severe HA for several weeks that comes and goes. Pt was seen at PCP and given toradol. PCP advised that if HA continued to come to ED. Pt denies weakness/numbness. Pt reports associated nausea and blurry vision.

## 2015-08-14 NOTE — ED Provider Notes (Signed)
CSN: [id number]     Arrival date & time 08/14/15  1105 History   First MD Initiated Contact with Patient 08/14/15 1132     Chief Complaint  Patient presents with  . Headache     Patient is a 45 y.o. female presenting with headaches. The history is provided by the patient. No language interpreter was used.  Headache  Tonya Suarez is a 45 y.o. female who presents to the Emergency Department complaining of headache.  She presents for evaluation of headache it's been ongoing for the last 2 weeks. Headache is waxing and waning in nature and located on the right posterior head to the right front of her head. She saw her PCP 4 days ago and received a shot of Toradol that improved her symptoms. She had a repeat current headache yesterday afternoon and received another shot of Toradol with improvement in her symptoms. This morning she awoke with recurrent headache. She denies any fevers, nausea, vomiting, numbness, weakness, vision changes. She has a history of high blood pressure and takes HCTZ. She has a history of headaches in the past but none recently for the last 2 weeks. No exposure to carbon monoxide.  Past Medical History  Diagnosis Date  . Allergy   . Depression   . GERD (gastroesophageal reflux disease)   . Arthritis   . Low back pain    Past Surgical History  Procedure Laterality Date  . Tubal ligation     Family History  Problem Relation Age of Onset  . Diabetes Father     mother  . Hyperlipidemia Father     mother  . Hypertension Father     mother  . Cancer - Other Sister     duodenal  . Colon cancer Father     60s, and grandmother   Social History  Substance Use Topics  . Smoking status: Never Smoker   . Smokeless tobacco: None  . Alcohol Use: 0.0 - 1.2 oz/week    0-2 Standard drinks or equivalent per week   OB History    No data available     Review of Systems  Neurological: Positive for headaches.  All other systems reviewed and are  negative.     Allergies  Review of patient's allergies indicates no known allergies.  Home Medications   Prior to Admission medications   Medication Sig Start Date End Date Taking? Authorizing Provider  bisoprolol-hydrochlorothiazide (ZIAC) 2.5-6.25 MG per tablet TAKE 1 TABLET BY MOUTH DAILY. 05/13/14  Yes Kennyth Arnold, FNP  meloxicam (MOBIC) 7.5 MG tablet Take 1 tablet (7.5 mg total) by mouth daily as needed for pain. 04/30/15  Yes Marin Olp, MD  pantoprazole (PROTONIX) 40 MG tablet TAKE 1 TABLET (40 MG TOTAL) BY MOUTH DAILY. 06/18/14  Yes Kennyth Arnold, FNP  PARoxetine (PAXIL) 10 MG tablet Take 1 tablet (10 mg total) by mouth daily. Patient taking differently: Take 10 mg by mouth daily as needed (anxiety).  03/19/14  Yes Kennyth Arnold, FNP  predniSONE (DELTASONE) 20 MG tablet Take 2 pills for 2 days, then pill daily for 5 days 08/13/15  Yes Marin Olp, MD  butalbital-acetaminophen-caffeine [facility]) [phone] MG tablet Take 1-2 tablets by mouth every 6 (six) hours as needed for headache. 08/14/15 08/13/16  Quintella Reichert, MD   BP 145/88 mmHg  Pulse 62  Temp(Src) 98.1 F (36.7 C) (Oral)  Resp 14  Ht 5\' 10"  (1.778 m)  Wt 256 lb (116.121 kg)  BMI 36.73  kg/m2  SpO2 100%  LMP 08/04/2015 Physical Exam  Constitutional: She is oriented to person, place, and time. She appears well-developed and well-nourished.  HENT:  Head: Normocephalic and atraumatic.  Right Ear: External ear normal.  Left Ear: External ear normal.  Eyes: EOM are normal. Pupils are equal, round, and reactive to light.  Neck: Neck supple.  Cardiovascular: Normal rate and regular rhythm.   No murmur heard. Pulmonary/Chest: Effort normal and breath sounds normal. No respiratory distress.  Abdominal: Soft. There is no tenderness. There is no rebound and no guarding.  Musculoskeletal: She exhibits no edema or tenderness.  Neurological: She is alert and oriented to person, place, and time. No cranial nerve  deficit. Coordination normal.  Skin: Skin is warm and dry.  Psychiatric: She has a normal mood and affect. Her behavior is normal.  Nursing note and vitals reviewed.   ED Course  Procedures (including critical care time) Labs Review Labs Reviewed  BASIC METABOLIC PANEL  CBC WITH DIFFERENTIAL/PLATELET    Imaging Review Ct Head Wo Contrast  08/14/2015  CLINICAL DATA:  Severe headache for 2 weeks. Nausea. No known injury. Initial encounter. EXAM: CT HEAD WITHOUT CONTRAST TECHNIQUE: Contiguous axial images were obtained from the base of the skull through the vertex without intravenous contrast. COMPARISON:  None. FINDINGS: The brain appears normal without infarct, hemorrhage, mass, mass effect, midline shift or abnormal extra axial fluid collection. There is no hydrocephalus or pneumocephalus. Imaged paranasal sinuses and mastoid air cells are clear. The calvarium is intact. IMPRESSION: Negative head CT. Electronically Signed   By: Inge Rise M.D.   On: 08/14/2015 12:39   I have personally reviewed and evaluated these images and lab results as part of my medical decision-making.   EKG Interpretation None      MDM   Final diagnoses:  Bad headache    Patient with history of headaches here for evaluation of recurrent headaches over the last 2 weeks. Presentation is not consistent with subarachnoid hemorrhage, meningitis, hypertensive urgency. Her headache is improved on repeat evaluation in the department. Plan to DC home with outpatient follow-up for her headaches. Prescribing Fioricet if she develops recurrent headache.  Quintella Reichert, MD 08/14/15 [phone]

## 2015-08-14 NOTE — Discharge Instructions (Signed)

## 2015-08-14 NOTE — ED Notes (Signed)
Pt ambulates independently and with steady gait at time of discharge. Discharge instructions and follow up information reviewed with patient. No other questions or concerns voiced at this time.  

## 2015-09-06 ENCOUNTER — Other Ambulatory Visit: Payer: Self-pay | Admitting: Family Medicine

## 2015-12-11 ENCOUNTER — Other Ambulatory Visit: Payer: Self-pay

## 2015-12-11 MED ORDER — MELOXICAM 7.5 MG PO TABS: 7.5000 mg | ORAL_TABLET | Freq: Every day | ORAL | 5 refills | Status: DC | PRN

## 2015-12-24 ENCOUNTER — Encounter: Payer: Self-pay | Admitting: Family Medicine

## 2015-12-24 ENCOUNTER — Ambulatory Visit (INDEPENDENT_AMBULATORY_CARE_PROVIDER_SITE_OTHER): Payer: BLUE CROSS/BLUE SHIELD | Admitting: Family Medicine

## 2015-12-24 VITALS — BP 138/104 | HR 74 | Temp 98.4°F | Wt 258.0 lb

## 2015-12-24 DIAGNOSIS — N390 Urinary tract infection, site not specified: Secondary | ICD-10-CM | POA: Diagnosis not present

## 2015-12-24 DIAGNOSIS — G2581 Restless legs syndrome: Secondary | ICD-10-CM | POA: Diagnosis not present

## 2015-12-24 DIAGNOSIS — N926 Irregular menstruation, unspecified: Secondary | ICD-10-CM | POA: Diagnosis not present

## 2015-12-24 DIAGNOSIS — I10 Essential (primary) hypertension: Secondary | ICD-10-CM | POA: Diagnosis not present

## 2015-12-24 DIAGNOSIS — Z23 Encounter for immunization: Secondary | ICD-10-CM

## 2015-12-24 DIAGNOSIS — B9689 Other specified bacterial agents as the cause of diseases classified elsewhere: Secondary | ICD-10-CM

## 2015-12-24 DIAGNOSIS — N760 Acute vaginitis: Secondary | ICD-10-CM

## 2015-12-24 LAB — POC URINALSYSI DIPSTICK (AUTOMATED)
Bilirubin, UA: NEGATIVE
Blood, UA: NEGATIVE
Glucose, UA: NEGATIVE
Ketones, UA: NEGATIVE
Leukocytes, UA: NEGATIVE
Nitrite, UA: NEGATIVE
Protein, UA: NEGATIVE
Spec Grav, UA: 1.02
Urobilinogen, UA: 0.2
pH, UA: 5.5

## 2015-12-24 MED ORDER — CLINDAMYCIN PHOSPHATE 2 % VA CREA: 1.0000 | TOPICAL_CREAM | Freq: Every day | VAGINAL | 0 refills | Status: DC

## 2015-12-24 MED ORDER — AMLODIPINE BESYLATE 2.5 MG PO TABS: 2.5000 mg | ORAL_TABLET | Freq: Every day | ORAL | 5 refills | Status: DC

## 2015-12-24 NOTE — Patient Instructions (Signed)
Start amlodiine 2.5mg . Follow up 4 weeks  Weight loss is going to be key to prevent more blood pressure medicine. Recommend over next 1-2 years targetting weight of 190-200.   We will call you within a week about your referral to gynecology. If you do not hear within 2 weeks, give [place] a call.   Treat bacterial vaginosis with targetted local therapy with clindamycin 2% for 10 days. Follow up with gynecology if recurrent issues. You declined exam today  Check labs for iron stores

## 2015-12-24 NOTE — Assessment & Plan Note (Signed)
S: controlled poorly on ziac 2.5-6.25 mg.  BP Readings from Last 3 Encounters:  12/24/15 (!) 138/104  08/14/15 145/88  08/13/15 (!) 142/102  A/P:Continue current meds:  Considered bumping up dose of ziac but suspect may have greater response to amlodipine so add 2.5mg  and 4 week follow up. Also discussed role of weight loss with HS weight around 160.

## 2015-12-24 NOTE — Progress Notes (Signed)
Pre visit review using our clinic review tool, if applicable. No additional management support is needed unless otherwise documented below in the visit note. 

## 2015-12-24 NOTE — Progress Notes (Signed)
Subjective:  Tonya Suarez is a 45 y.o. year old very pleasant female patient who presents for/with See problem oriented charting ROS- No chest pain or shortness of breath. No headache or blurry vision. Fishy vaginal discharge. also.see any ROS included in HPI as well.   Past Medical History-  Patient Active Problem List   Diagnosis Date Noted  . HTN (hypertension) 11/21/2011    Priority: Medium  . Depression 09/06/2006    Priority: Medium  . Family history of cancer of GI tract 12/06/2012    Priority: Low  . MENORRHAGIA 09/23/2009    Priority: Low  . PLANTAR FASCIITIS 09/20/2007    Priority: Low  . Osteoarthritis 09/19/2006    Priority: Low  . Allergic rhinitis 09/06/2006    Priority: Low  . GERD 09/06/2006    Priority: Low    Medications- reviewed and updated Current Outpatient Prescriptions  Medication Sig Dispense Refill  . bisoprolol-hydrochlorothiazide (ZIAC) 2.5-6.25 MG per tablet TAKE 1 TABLET BY MOUTH DAILY. 90 tablet 1  . butalbital-acetaminophen-caffeine (FIORICET) 50-325-40 MG tablet Take 1-2 tablets by mouth every 6 (six) hours as needed for headache. 10 tablet 0  . meloxicam (MOBIC) 7.5 MG tablet Take 1 tablet (7.5 mg total) by mouth daily as needed for pain. 30 tablet 5  . pantoprazole (PROTONIX) 40 MG tablet TAKE 1 TABLET (40 MG TOTAL) BY MOUTH DAILY. 90 tablet 1  . PARoxetine (PAXIL) 10 MG tablet Take 1 tablet (10 mg total) by mouth daily. (Patient taking differently: Take 10 mg by mouth daily as needed (anxiety). ) 30 tablet 1  . amLODipine (NORVASC) 2.5 MG tablet Take 1 tablet (2.5 mg total) by mouth daily. 30 tablet 5  . clindamycin (CLEOCIN) 2 % vaginal cream Place 1 Applicatorful vaginally at bedtime. For 14 days 80 g 0   No current facility-administered medications for this visit.     Objective: BP (!) 138/104 (BP Location: Left Arm, Patient Position: Sitting, Cuff Size: Large)   Pulse 74   Temp 98.4 F (36.9 C) (Oral)   Wt 258 lb (117 kg)    SpO2 96%   BMI 37.02 kg/m  Gen: NAD, resting comfortably in chair CV: RRR no murmurs rubs or gallops Lungs: CTAB no crackles, wheeze, rhonchi Abdomen: soft/nontender/nondistended/normal bowel sounds. No rebound or guarding.  Ext: no edema  Declines GYN exam  Assessment/Plan:  HTN (hypertension) S: controlled poorly on ziac 2.5-6.25 mg.  BP Readings from Last 3 Encounters:  12/24/15 (!) 138/104  08/14/15 145/88  08/13/15 (!) 142/102  A/P:Continue current meds:  Considered bumping up dose of ziac but suspect may have greater response to amlodipine so add 2.5mg  and 4 week follow up. Also discussed role of weight loss with HS weight around 160.   Urine odor- actually likely vaginal discharge with odor S: urine has smelled stronger for 2 weeks- maybe longer than this, noticed some discharge with it in vagina. Not pruritic. Mild irritation feeling. At times fishy odor- sometimes more ammonia like smell. Creamy white at times discharge. Sexually active only with husband for 4 years (prior fiancee). No concerns of infidelity.   Symptoms very similar to march when had bacterial vaginosis.  ROS- no dysuria, polyuria (very mild maybe).  A/P: suspect BV. Advised vaginal exam- she declines (seems to prefer to see gynecology for this evaluation). Given similar symptoms to previous when had BV- we did agree to treat again. Prior Metronidazole last time- will change to clindamycin cream for 10 days. She can follow up with  GYN if not resolved. Declines STD testing  Irregular menstrual bleeding - Plan: CBC, Ferritin, Ambulatory referral to Gynecology S: last year was having some spacing out of periods but then year has now become more frequent and sometimes will bleed up to 4x a mont- usually bleeds after sex but may end up bleeding for 2-3 days.  A/P: Refer to gynecology for irregular menstrual bleeding for consideration if she may need endometrial biopsy or vaginal ultrasound. Could also be  postcoital bleeding but odd bleeds for several days after sex. Of note she has bilateral tubal ligationand no abdominal pain- doubt this is pregnancy related   Restless legs - Plan: CBC, Ferritin S:Legs ache at the end of the day. Feels like she has to move them and it helps.  A/P:check Cbc/ferritin - may be anemia with more frequent periods now  4 weeks  Orders Placed This Encounter  Procedures  . Flu Vaccine QUAD 36+ mos IM  . CBC    [place]  . Ferritin  . Ambulatory referral to Gynecology    Referral Priority:   Routine    Referral Type:   Consultation    Referral Reason:   Specialty Services Required    Requested Specialty:   Gynecology    Number of Visits Requested:   1  . POCT Urinalysis Dipstick (Automated)    Meds ordered this encounter  Medications  . amLODipine (NORVASC) 2.5 MG tablet    Sig: Take 1 tablet (2.5 mg total) by mouth daily.    Dispense:  30 tablet    Refill:  5  . clindamycin (CLEOCIN) 2 % vaginal cream    Sig: Place 1 Applicatorful vaginally at bedtime. For 14 days    Dispense:  80 g    Refill:  0    Return precautions advised.  Garret Reddish, MD

## 2015-12-25 LAB — CBC
HCT: 41.9 % (ref 36.0–46.0)
Hemoglobin: 14.3 g/dL (ref 12.0–15.0)
MCHC: 34.2 g/dL (ref 30.0–36.0)
MCV: 91.1 fl (ref 78.0–100.0)
Platelets: 236 10*3/uL (ref 150.0–400.0)
RBC: 4.6 Mil/uL (ref 3.87–5.11)
RDW: 13.8 % (ref 11.5–15.5)
WBC: 11.1 10*3/uL — ABNORMAL HIGH (ref 4.0–10.5)

## 2015-12-25 LAB — FERRITIN: Ferritin: 16.7 ng/mL (ref 10.0–291.0)

## 2016-01-04 ENCOUNTER — Ambulatory Visit (HOSPITAL_COMMUNITY)
Admission: EM | Admit: 2016-01-04 | Discharge: 2016-01-04 | Disposition: A | Discharge status: Home / Self Care | Payer: BLUE CROSS/BLUE SHIELD | Attending: Emergency Medicine | Admitting: Emergency Medicine

## 2016-01-04 ENCOUNTER — Encounter (HOSPITAL_COMMUNITY): Payer: Self-pay | Admitting: Emergency Medicine

## 2016-01-04 DIAGNOSIS — S161XXA Strain of muscle, fascia and tendon at neck level, initial encounter: Secondary | ICD-10-CM | POA: Diagnosis not present

## 2016-01-04 MED ORDER — CYCLOBENZAPRINE HCL 10 MG PO TABS: 10.0000 mg | ORAL_TABLET | Freq: Every day | ORAL | 0 refills | Status: DC

## 2016-01-04 NOTE — ED Provider Notes (Signed)
[place]    CSN: [id number] Arrival date & time: 01/04/16  1548     History   Chief Complaint Chief Complaint  Patient presents with  . Shoulder Pain    HPI Tonya Suarez is a 45 y.o. female.   HPI She is a 45 year old woman here for evaluation of right neck and shoulder pain. She states a week or 2 ago she woke up with a crick in her neck. This resolved, but then recurred. Pain is in the right upper back and radiates up to her shoulder and neck. She feels like her right ear is popping. She has tried ice, heat, massage without much improvement. She is on meloxicam for back pain. Pain is worse when she wakes up. It improves over the course of the day  Past Medical History:  Diagnosis Date  . Allergy   . Arthritis   . Depression   . GERD (gastroesophageal reflux disease)   . Low back pain     Patient Active Problem List   Diagnosis Date Noted  . Family history of cancer of GI tract 12/06/2012  . HTN (hypertension) 11/21/2011  . MENORRHAGIA 09/23/2009  . PLANTAR FASCIITIS 09/20/2007  . Osteoarthritis 09/19/2006  . Depression 09/06/2006  . Allergic rhinitis 09/06/2006  . GERD 09/06/2006    Past Surgical History:  Procedure Laterality Date  . TUBAL LIGATION      OB History    No data available       Home Medications    Prior to Admission medications   Medication Sig Start Date End Date Taking? Authorizing Provider  amLODipine (NORVASC) 2.5 MG tablet Take 1 tablet (2.5 mg total) by mouth daily. 12/24/15  Yes Marin Olp, MD  bisoprolol-hydrochlorothiazide (ZIAC) 2.5-6.25 MG per tablet TAKE 1 TABLET BY MOUTH DAILY. 05/13/14  Yes Kennyth Arnold, FNP  butalbital-acetaminophen-caffeine (FIORICET) [phone] MG tablet Take 1-2 tablets by mouth every 6 (six) hours as needed for headache. 08/14/15 08/13/16 Yes Quintella Reichert, MD  meloxicam (MOBIC) 7.5 MG tablet Take 1 tablet (7.5 mg total) by mouth daily as needed for pain. 12/11/15  Yes Marin Olp, MD  pantoprazole (PROTONIX) 40 MG tablet TAKE 1 TABLET (40 MG TOTAL) BY MOUTH DAILY. 06/18/14  Yes Kennyth Arnold, FNP  PARoxetine (PAXIL) 10 MG tablet Take 1 tablet (10 mg total) by mouth daily. Patient taking differently: Take 10 mg by mouth daily as needed (anxiety).  03/19/14  Yes Kennyth Arnold, FNP  clindamycin (CLEOCIN) 2 % vaginal cream Place 1 Applicatorful vaginally at bedtime. For 14 days 12/24/15   Marin Olp, MD  cyclobenzaprine (FLEXERIL) 10 MG tablet Take 1 tablet (10 mg total) by mouth at bedtime. 01/04/16   Melony Overly, MD    Family History Family History  Problem Relation Age of Onset  . Diabetes Father     mother  . Hyperlipidemia Father     mother  . Hypertension Father     mother  . Colon cancer Father     64s, and grandmother  . Cancer - Other Sister     duodenal    Social History Social History  Substance Use Topics  . Smoking status: Never Smoker  . Smokeless tobacco: Never Used  . Alcohol use 0.0 - 1.2 oz/week     Allergies   Patient has no known allergies.   Review of Systems Review of Systems As in history of present illness  Physical Exam Triage Vital Signs ED  Triage Vitals  Enc Vitals Group     BP 01/04/16 1730 136/77     Pulse Rate 01/04/16 1730 68     Resp 01/04/16 1730 18     Temp 01/04/16 1730 98.4 F (36.9 C)     Temp Source 01/04/16 1730 Oral     SpO2 01/04/16 1730 100 %     Weight --      Height --      Head Circumference --      Peak Flow --      Pain Score 01/04/16 1736 5     Pain Loc --      Pain Edu? --      Excl. in [place]? --    No data found.   Updated Vital Signs BP 136/77 (BP Location: Left Arm)   Pulse 68   Temp 98.4 F (36.9 C) (Oral)   Resp 18   LMP 12/21/2015 (Exact Date)   SpO2 100%   Visual Acuity Right Eye Distance:   Left Eye Distance:   Bilateral Distance:    Right Eye Near:   Left Eye Near:    Bilateral Near:     Physical Exam  Constitutional: She is oriented to person,  place, and time. She appears well-developed and well-nourished. No distress.  Cardiovascular: Normal rate.   Pulmonary/Chest: Effort normal.  Musculoskeletal:  Right shoulder: No deformity. She has muscle tension along the medial border of the scapula as well as the trapezius. Full active range of motion. Neck: No vertebral tenderness. Full active range of motion. Some discomfort with extremes of movement.  Neurological: She is alert and oriented to person, place, and time.     UC Treatments / Results  Labs (all labs ordered are listed, but only abnormal results are displayed) Labs Reviewed - No data to display  EKG  EKG Interpretation None       Radiology No results found.  Procedures Procedures (including critical care time)  Medications Ordered in UC Medications - No data to display   Initial Impression / Assessment and Plan / UC Course  I have reviewed the triage vital signs and the nursing notes.  Pertinent labs & imaging results that were available during my care of the patient were reviewed by me and considered in my medical decision making (see chart for details).  Clinical Course     Continue heat, massage, and meloxicam. Add Flexeril. Discussed using a supportive pillow at night.  Final Clinical Impressions(s) / UC Diagnoses   Final diagnoses:  Strain of neck muscle, initial encounter    New Prescriptions Discharge Medication List as of 01/04/2016  6:32 PM    START taking these medications   Details  cyclobenzaprine (FLEXERIL) 10 MG tablet Take 1 tablet (10 mg total) by mouth at bedtime., Starting Mon 01/04/2016, Normal         Melony Overly, MD 01/04/16 2009

## 2016-01-04 NOTE — Discharge Instructions (Signed)
Your pain appears to be coming from the muscles in your back and shoulder. Continue your meloxicam. Apply heat in the evening. Keep doing the icy hot massage. Start taking Flexeril at bedtime. Try sleeping with a travel pillow around your neck. Follow-up if not improving in 1 week.

## 2016-01-04 NOTE — ED Triage Notes (Signed)
The patient presented to the [facility] with a complaint of shoulder pain. The patient reported that she has had pain that starts in between her shoulder blades and radiates to the right side of her neck and ear. The patient reported that the pain is worse at night. The patient denied any known injury.

## 2016-01-13 ENCOUNTER — Ambulatory Visit (INDEPENDENT_AMBULATORY_CARE_PROVIDER_SITE_OTHER): Payer: BLUE CROSS/BLUE SHIELD | Admitting: Obstetrics and Gynecology

## 2016-01-13 ENCOUNTER — Encounter: Payer: Self-pay | Admitting: Obstetrics and Gynecology

## 2016-01-13 VITALS — BP 120/80 | HR 64 | Resp 14 | Wt 258.0 lb

## 2016-01-13 DIAGNOSIS — N941 Unspecified dyspareunia: Secondary | ICD-10-CM

## 2016-01-13 DIAGNOSIS — R87610 Atypical squamous cells of undetermined significance on cytologic smear of cervix (ASC-US): Secondary | ICD-10-CM | POA: Diagnosis not present

## 2016-01-13 DIAGNOSIS — N921 Excessive and frequent menstruation with irregular cycle: Secondary | ICD-10-CM | POA: Diagnosis not present

## 2016-01-13 DIAGNOSIS — N858 Other specified noninflammatory disorders of uterus: Secondary | ICD-10-CM | POA: Diagnosis not present

## 2016-01-13 DIAGNOSIS — N889 Noninflammatory disorder of cervix uteri, unspecified: Secondary | ICD-10-CM

## 2016-01-13 DIAGNOSIS — Z124 Encounter for screening for malignant neoplasm of cervix: Secondary | ICD-10-CM

## 2016-01-13 DIAGNOSIS — N946 Dysmenorrhea, unspecified: Secondary | ICD-10-CM

## 2016-01-13 NOTE — Progress Notes (Signed)
45 y.o. [id number] DivorcedAfrican AmericanF sent for a consultation from Dr Yong Channel for irregular menstrual cycles    Prior to last September she was bleeding every 2-3 weeks (long term). A year ago September she didn't bleed for 4 months. Since then q2-5 weeks. No more skipped cycles. No BTB, other than occasional post coital bleeding (mostly light, one x like a cycle for 3 days). Typically bleeds for 3 days, can saturate a pad every hour, passes clots. Cramps vary, can be severe, always on the left side. Can have aching down her left leg.  Normal TSH in 3/17, not anemic on recent blood work. No galactorrhea, no weight changes, + constipation.  Got married in 5/17. Occasional deep dyspareunia. Occasional hot flashes, night sweats, some vaginal dryness, helped with lubrication.  U/S in 2012 was normal.  Period Duration (Days): cycles vary from every 2-3 weeks to skipping months at a time  Period Pattern: (!) Irregular Menstrual Flow: Moderate, Heavy Menstrual Control: Maxi pad Menstrual Control Change Freq (Hours): changes pad every hour on heavy days  Dysmenorrhea: (!) Severe Dysmenorrhea Symptoms: Cramping, Headache, Other (Comment)  Patient's last menstrual period was 01/06/2016.          Sexually active: Yes.    The current method of family planning is tubal ligation.    Exercising: No.  The patient does not participate in regular exercise at present. Smoker:  no  Health Maintenance: Pap:  03-19-14 WNL History of abnormal Pap:  no MMG:  10-02-09 WNL  Colonoscopy:  2000 WNL  BMD:   Never TDaP:  11-21-11 Gardasil: N/A   reports that she has never smoked. She has never used smokeless tobacco. She reports that she drinks alcohol. 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 Son is married, has a Conservator, museum/gallery (lives local). Daughter lives at home.   Past Medical History:  Diagnosis Date  . Allergy   . Arthritis   . Depression   . GERD (gastroesophageal  reflux disease)   . Hypertension   . Low back pain     Past Surgical History:  Procedure Laterality Date  . TUBAL LIGATION      Current Outpatient Prescriptions  Medication Sig Dispense Refill  . amLODipine (NORVASC) 2.5 MG tablet Take 1 tablet (2.5 mg total) by mouth daily. 30 tablet 5  . bisoprolol-hydrochlorothiazide (ZIAC) 2.5-6.25 MG per tablet TAKE 1 TABLET BY MOUTH DAILY. 90 tablet 1  . butalbital-acetaminophen-caffeine (FIORICET) 50-325-40 MG tablet Take 1-2 tablets by mouth every 6 (six) hours as needed for headache. 10 tablet 0  . cyclobenzaprine (FLEXERIL) 10 MG tablet Take 1 tablet (10 mg total) by mouth at bedtime. 20 tablet 0  . meloxicam (MOBIC) 7.5 MG tablet Take 1 tablet (7.5 mg total) by mouth daily as needed for pain. 30 tablet 5  . pantoprazole (PROTONIX) 40 MG tablet TAKE 1 TABLET (40 MG TOTAL) BY MOUTH DAILY. 90 tablet 1  . PARoxetine (PAXIL) 10 MG tablet Take 1 tablet (10 mg total) by mouth daily. (Patient taking differently: Take 10 mg by mouth daily as needed (anxiety). ) 30 tablet 1   No current facility-administered medications for this visit.     Family History  Problem Relation Age of Onset  . Diabetes Father     mother  . Hyperlipidemia Father     mother  . Hypertension Father     mother  . Colon cancer Father     73s, and grandmother  . Cancer -  Other Sister     duodenal  . Diabetes Mother   . Hypertension Mother   . Hyperlipidemia Mother   . Colon cancer Paternal Grandmother   Sister died at 35, almost 3 years ago.   Review of Systems  Constitutional: Negative.   HENT: Negative.   Eyes: Negative.   Respiratory: Negative.   Cardiovascular: Negative.   Gastrointestinal: Negative.   Endocrine: Negative.   Genitourinary: Positive for menstrual problem.       Irregular menstrual cycles  Heavy menstrual bleeding   Musculoskeletal: Negative.   Skin: Negative.   Allergic/Immunologic: Negative.   Neurological: Negative.    Psychiatric/Behavioral: Negative.     Exam:   BP 120/80 (BP Location: Right Arm, Patient Position: Sitting, Cuff Size: Normal)   Pulse 64   Resp 14   Wt 258 lb (117 kg)   LMP 01/06/2016   BMI 37.02 kg/m   Weight change: @WEIGHTCHANGE @ Height:      Ht Readings from Last 3 Encounters:  08/14/15 5\' 10"  (1.778 m)  08/13/15 5\' 10"  (1.778 m)  08/10/15 5\' 10"  (1.778 m)    General appearance: alert, cooperative and appears stated age Head: Normocephalic, without obvious abnormality, atraumatic Neck: no adenopathy, supple, symmetrical, trachea midline and thyroid normal to inspection and palpation Lungs: clear to auscultation bilaterally Heart: regular rate and rhythm Abdomen: soft, non-tender; bowel sounds normal; no masses,  no organomegaly Extremities: extremities normal, atraumatic, no cyanosis or edema Skin: Skin color, texture, turgor normal. No rashes or lesions Lymph nodes: Cervical, supraclavicular, and axillary nodes normal. No abnormal inguinal nodes palpated Neurologic: Grossly normal   Pelvic: External genitalia:  no lesions              Urethra:  normal appearing urethra with no masses, tenderness or lesions              Bartholins and Skenes: normal                 Vagina: normal appearing vagina with normal color and discharge, no lesions              Cervix: patch of white tissue on the cervix at 12 o'clock               Bimanual Exam:  Uterus:  normal size, contour, position, consistency, mobility, non-tender              Adnexa: no mass, fullness, tenderness                 Chaperone was present for exam.  A: Menometrorrhagia, normal TSH, normal CBC  Mild vasomotor symptoms, likely perimenopausal  Dysmenorrhea (worse on the left)  Intermittent deep dyspareunia (on the left)  Cervical whitening, normal pap within the year  P:   Endometrial biopsy  Return for ultrasound, possible sonohysterogram  Pap with hpv  Further plans after evaluation is  complete  CC: Dr Garret Reddish Letter sent

## 2016-01-13 NOTE — Patient Instructions (Signed)

## 2016-01-20 LAB — IPS PAP TEST WITH HPV

## 2016-01-22 ENCOUNTER — Telehealth: Payer: Self-pay | Admitting: *Deleted

## 2016-01-22 NOTE — Telephone Encounter (Signed)
Spoke with patient, advised as seen below per Dr. Jertson. Patient verbalizes understanding and is agreeable.   Routing to provider for final review. Patient is agreeable to disposition. Will close encounter.  

## 2016-01-22 NOTE — Telephone Encounter (Signed)
Left message to call Aldonia Keeven at [phone].  

## 2016-01-22 NOTE — Telephone Encounter (Signed)
-----   Message from Salvadore Dom, MD sent at 01/20/2016  4:42 PM EST ----- ASCUS, negative HPV. F/u pap in 3 years. Please inform

## 2016-03-04 ENCOUNTER — Encounter: Payer: Self-pay | Admitting: Family Medicine

## 2016-03-04 ENCOUNTER — Ambulatory Visit (INDEPENDENT_AMBULATORY_CARE_PROVIDER_SITE_OTHER): Payer: BLUE CROSS/BLUE SHIELD | Admitting: Family Medicine

## 2016-03-04 VITALS — BP 132/82 | HR 68 | Temp 98.0°F | Ht 70.0 in | Wt 259.4 lb

## 2016-03-04 DIAGNOSIS — I10 Essential (primary) hypertension: Secondary | ICD-10-CM

## 2016-03-04 DIAGNOSIS — J069 Acute upper respiratory infection, unspecified: Secondary | ICD-10-CM | POA: Diagnosis not present

## 2016-03-04 MED ORDER — METHYLPREDNISOLONE ACETATE 80 MG/ML IJ SUSP
80.0000 mg | Freq: Once | INTRAMUSCULAR | Status: AC
  Administered 2016-03-04: 80 mg via INTRAMUSCULAR

## 2016-03-04 MED ORDER — BUTALBITAL-APAP-CAFFEINE 50-325-40 MG PO TABS: 1.0000 | ORAL_TABLET | Freq: Four times a day (QID) | ORAL | 0 refills | Status: AC | PRN

## 2016-03-04 NOTE — Addendum Note (Signed)
Addended by: Marin Olp on: 03/04/2016 04:12 PM   Modules accepted: Orders

## 2016-03-04 NOTE — Progress Notes (Signed)
Pre visit review using our clinic review tool, if applicable. No additional management support is needed unless otherwise documented below in the visit note. 

## 2016-03-04 NOTE — Progress Notes (Addendum)
PCP: Garret Reddish, MD  Subjective:  Tonya Suarez is a 46 y.o. year old very pleasant female patient who presents with  symptoms including nasal congestion but also dryness, sore and itchy throat, cough, fatigue. No major body aches. Mild headache. No sinus pain -started: 2-3 days ago, symptoms show no change -previous treatments:  -sick contacts/travel/risks: denies flu exposure.  -Hx of: allergies  ROS-denies fever (does get warm then cold at times),  NVD, tooth pain. SOB with coughing spells, occasional mild wheeze  Pertinent Past Medical History-  Patient Active Problem List   Diagnosis Date Noted  . HTN (hypertension) 11/21/2011    Priority: Medium  . Depression 09/06/2006    Priority: Medium  . Family history of cancer of GI tract 12/06/2012    Priority: Low  . MENORRHAGIA 09/23/2009    Priority: Low  . PLANTAR FASCIITIS 09/20/2007    Priority: Low  . Osteoarthritis 09/19/2006    Priority: Low  . Allergic rhinitis 09/06/2006    Priority: Low  . GERD 09/06/2006    Priority: Low    Medications- reviewed  Current Outpatient Prescriptions  Medication Sig Dispense Refill  . amLODipine (NORVASC) 2.5 MG tablet Take 1 tablet (2.5 mg total) by mouth daily. 30 tablet 5  . bisoprolol-hydrochlorothiazide (ZIAC) 2.5-6.25 MG per tablet TAKE 1 TABLET BY MOUTH DAILY. 90 tablet 1  . butalbital-acetaminophen-caffeine (FIORICET) 50-325-40 MG tablet Take 1-2 tablets by mouth every 6 (six) hours as needed for headache. 10 tablet 0  . cyclobenzaprine (FLEXERIL) 10 MG tablet Take 1 tablet (10 mg total) by mouth at bedtime. 20 tablet 0  . meloxicam (MOBIC) 7.5 MG tablet Take 1 tablet (7.5 mg total) by mouth daily as needed for pain. 30 tablet 5  . pantoprazole (PROTONIX) 40 MG tablet TAKE 1 TABLET (40 MG TOTAL) BY MOUTH DAILY. 90 tablet 1  . PARoxetine (PAXIL) 10 MG tablet Take 1 tablet (10 mg total) by mouth daily. (Patient taking differently: Take 10 mg by mouth daily as needed  (anxiety). ) 30 tablet 1   No current facility-administered medications for this visit.     Objective: BP 132/82   Pulse 68   Temp 98 F (36.7 C) (Oral)   Ht 5\' 10"  (1.778 m)   Wt 259 lb 6.4 oz (117.7 kg)   SpO2 91%   BMI 37.22 kg/m  Gen: NAD, resting comfortably HEENT: Turbinates erythematous, TM normal, pharynx mildly erythematous with no tonsilar exudate or edema, no sinus tenderness CV: RRR no murmurs rubs or gallops Lungs: CTAB no crackles, wheeze, rhonchi Abdomen: obese  Ext: no edema Skin: warm, dry, no rash Neuro: grossly normal, moves all extremities  Assessment/Plan:  Upper Respiratory infection History and exam today are suggestive of viral infection most likely due to upper respiratory infection. Symptomatic treatment with: steroid shot today. Tylenol 650 mg every 6 hours can help with sore throat, body aches as needed. This often comes in an over the counter cough medicine- I would avoid anything with a decongestant with your blood pressure.   We discussed that we did not find any infection that had higher probability of being bacterial such as pneumonia or strep throat or ear infection. We discussed signs that bacterial infection may have developed particularly fever or shortness of breath. Likely course of 10-14 days. Patient is contagious and advised good handwashing and consideration of mask If going to be in public places.   Hypertension S: controlled on meds as above- initially elevated.  BP Readings from  Last 3 Encounters:  03/04/16 132/82  01/13/16 120/80  01/04/16 136/77  A/P:Continue current meds:  We discussed avoiding nsaids and decongestants as able   Finally, we reviewed reasons to return to care including if symptoms worsen or persist or new concerns arise- once again particularly shortness of breath or fever.  Depo- medrol 80mg  given  Garret Reddish, MD

## 2016-03-04 NOTE — Patient Instructions (Signed)
Upper Respiratory infection History and exam today are suggestive of viral infection most likely due to upper respiratory infection. Symptomatic treatment with: steroid shot today. Tylenol 650 mg every 6 hours can help with sore throat, body aches as needed. This often comes in an over the counter cough medicine- I would avoid anything with a decongestant with your blood pressure.   We discussed that we did not find any infection that had higher probability of being bacterial such as pneumonia or strep throat or ear infection. We discussed signs that bacterial infection may have developed particularly fever or shortness of breath. Likely course of 10-14 days. Patient is contagious and advised good handwashing and consideration of mask If going to be in public places.   Finally, we reviewed reasons to return to care including if symptoms worsen or persist or new concerns arise- once again particularly shortness of breath or fever.  Depo- medrol 80mg  given

## 2016-03-04 NOTE — Addendum Note (Signed)
Addended by: Lyndle Herrlich on: 03/04/2016 04:14 PM   Modules accepted: Orders

## 2016-03-22 ENCOUNTER — Telehealth: Payer: Self-pay | Admitting: *Deleted

## 2016-03-22 DIAGNOSIS — N921 Excessive and frequent menstruation with irregular cycle: Secondary | ICD-10-CM

## 2016-03-22 NOTE — Telephone Encounter (Signed)
Call to patient to follow-up on recommended pelvic ultrasound. Per ROI, can leave message on [phone]. Left message to call back.

## 2016-03-28 NOTE — Telephone Encounter (Signed)
Call to patient. Reviewed recommendation for PUS poss SHGM (pending results). Patient agreeable. Has had BTSP. LMP approximately 03/07/16 but states it is very unpredictable. Appointment scheduled for 04-05-16 per patient request but advised if bleeding heavily from cycle, may need to reschedule.   Routing to provider for final review. Patient agreeable to disposition. Will close encounter.

## 2016-04-05 ENCOUNTER — Other Ambulatory Visit: Payer: BLUE CROSS/BLUE SHIELD | Admitting: Obstetrics and Gynecology

## 2016-04-05 ENCOUNTER — Telehealth: Payer: Self-pay | Admitting: Obstetrics and Gynecology

## 2016-04-05 ENCOUNTER — Other Ambulatory Visit: Payer: BLUE CROSS/BLUE SHIELD

## 2016-04-05 NOTE — Telephone Encounter (Signed)
Patient cancelled ultrasound today because her cycle is on. Please call to reschedule.

## 2016-04-05 NOTE — Telephone Encounter (Signed)
Left message to call [name] at [phone]. 

## 2016-04-12 ENCOUNTER — Other Ambulatory Visit: Payer: Self-pay | Admitting: Family Medicine

## 2016-04-19 NOTE — Telephone Encounter (Signed)
Left message to call [name] at [phone]. 

## 2016-04-26 NOTE — Telephone Encounter (Signed)
Dr.Jertson, I have attempted to reach this patient x2 without return call. Please advise next steps.

## 2016-04-26 NOTE — Telephone Encounter (Signed)
Please send the patient a letter recommending further work up for her abnormal bleeding and then close the encounter.

## 2016-04-27 NOTE — Telephone Encounter (Signed)
Letter to [place] for review before sending.

## 2016-04-28 ENCOUNTER — Other Ambulatory Visit (INDEPENDENT_AMBULATORY_CARE_PROVIDER_SITE_OTHER): Payer: BLUE CROSS/BLUE SHIELD

## 2016-04-28 DIAGNOSIS — Z0000 Encounter for general adult medical examination without abnormal findings: Secondary | ICD-10-CM | POA: Diagnosis not present

## 2016-04-28 LAB — BASIC METABOLIC PANEL
BUN: 8 mg/dL (ref 6–23)
CO2: 29 mEq/L (ref 19–32)
Calcium: 9.5 mg/dL (ref 8.4–10.5)
Chloride: 105 mEq/L (ref 96–112)
Creatinine, Ser: 0.86 mg/dL (ref 0.40–1.20)
GFR: 90.7 mL/min (ref >60.00)
Glucose, Bld: 86 mg/dL (ref 70–99)
Potassium: 4.5 mEq/L (ref 3.5–5.1)
Sodium: 139 mEq/L (ref 135–145)

## 2016-04-28 LAB — CBC WITH DIFFERENTIAL/PLATELET
Basophils Absolute: 0 10*3/uL (ref 0.0–0.1)
Basophils Relative: 0.4 % (ref 0.0–3.0)
Eosinophils Absolute: 0.2 10*3/uL (ref 0.0–0.7)
Eosinophils Relative: 2 % (ref 0.0–5.0)
HCT: 42.2 % (ref 36.0–46.0)
Hemoglobin: 14.9 g/dL (ref 12.0–15.0)
Lymphocytes Relative: 30.5 % (ref 12.0–46.0)
Lymphs Abs: 3.8 10*3/uL (ref 0.7–4.0)
MCHC: 35.4 g/dL (ref 30.0–36.0)
MCV: 88.8 fl (ref 78.0–100.0)
Monocytes Absolute: 0.5 10*3/uL (ref 0.1–1.0)
Monocytes Relative: 4.1 % (ref 3.0–12.0)
Neutro Abs: 7.9 10*3/uL — ABNORMAL HIGH (ref 1.4–7.7)
Neutrophils Relative %: 63 % (ref 43.0–77.0)
Platelets: 241 10*3/uL (ref 150.0–400.0)
RBC: 4.75 Mil/uL (ref 3.87–5.11)
RDW: 14.4 % (ref 11.5–15.5)
WBC: 12.5 10*3/uL — ABNORMAL HIGH (ref 4.0–10.5)

## 2016-04-28 LAB — HEPATIC FUNCTION PANEL
ALT: 28 U/L (ref 0–35)
AST: 29 U/L (ref 0–37)
Albumin: 3.9 g/dL (ref 3.5–5.2)
Alkaline Phosphatase: 61 U/L (ref 39–117)
Bilirubin, Direct: 0.2 mg/dL (ref 0.0–0.3)
Total Bilirubin: 0.8 mg/dL (ref 0.2–1.2)
Total Protein: 6.2 g/dL (ref 6.0–8.3)

## 2016-04-28 LAB — POC URINALSYSI DIPSTICK (AUTOMATED)
Bilirubin, UA: NEGATIVE
Blood, UA: NEGATIVE
Glucose, UA: NEGATIVE
Ketones, UA: NEGATIVE
Leukocytes, UA: NEGATIVE
Nitrite, UA: NEGATIVE
Protein, UA: NEGATIVE
Spec Grav, UA: 1.025
Urobilinogen, UA: 0.2
pH, UA: 6.5

## 2016-04-28 LAB — LIPID PANEL
Cholesterol: 133 mg/dL (ref 0–200)
HDL: 39.4 mg/dL (ref >39.00)
LDL Cholesterol: 77 mg/dL (ref 0–99)
NonHDL: 93.32
Total CHOL/HDL Ratio: 3
Triglycerides: 82 mg/dL (ref 0.0–149.0)
VLDL: 16.4 mg/dL (ref 0.0–40.0)

## 2016-04-28 LAB — TSH: TSH: 1.84 u[IU]/mL (ref 0.35–4.50)

## 2016-04-28 NOTE — Telephone Encounter (Signed)
Certified letter mailed to patient's home address on file.  Will close the encounter.

## 2016-05-06 ENCOUNTER — Ambulatory Visit (INDEPENDENT_AMBULATORY_CARE_PROVIDER_SITE_OTHER): Payer: BLUE CROSS/BLUE SHIELD | Admitting: Family Medicine

## 2016-05-06 ENCOUNTER — Encounter: Payer: Self-pay | Admitting: Internal Medicine

## 2016-05-06 ENCOUNTER — Other Ambulatory Visit: Payer: Self-pay | Admitting: Family Medicine

## 2016-05-06 ENCOUNTER — Encounter: Payer: Self-pay | Admitting: Family Medicine

## 2016-05-06 VITALS — BP 118/88 | HR 75 | Temp 98.7°F | Ht 67.5 in | Wt 255.6 lb

## 2016-05-06 DIAGNOSIS — Z0000 Encounter for general adult medical examination without abnormal findings: Secondary | ICD-10-CM

## 2016-05-06 DIAGNOSIS — Z1231 Encounter for screening mammogram for malignant neoplasm of breast: Secondary | ICD-10-CM

## 2016-05-06 DIAGNOSIS — F3341 Major depressive disorder, recurrent, in partial remission: Secondary | ICD-10-CM

## 2016-05-06 DIAGNOSIS — M150 Primary generalized (osteo)arthritis: Secondary | ICD-10-CM | POA: Diagnosis not present

## 2016-05-06 DIAGNOSIS — I10 Essential (primary) hypertension: Secondary | ICD-10-CM | POA: Diagnosis not present

## 2016-05-06 DIAGNOSIS — G43909 Migraine, unspecified, not intractable, without status migrainosus: Secondary | ICD-10-CM

## 2016-05-06 DIAGNOSIS — Z1211 Encounter for screening for malignant neoplasm of colon: Secondary | ICD-10-CM

## 2016-05-06 DIAGNOSIS — M159 Polyosteoarthritis, unspecified: Secondary | ICD-10-CM

## 2016-05-06 DIAGNOSIS — M8949 Other hypertrophic osteoarthropathy, multiple sites: Secondary | ICD-10-CM

## 2016-05-06 MED ORDER — AMLODIPINE BESYLATE 2.5 MG PO TABS: 2.5000 mg | ORAL_TABLET | Freq: Every day | ORAL | 3 refills | Status: DC

## 2016-05-06 NOTE — Progress Notes (Signed)
Pre visit review using our clinic review tool, if applicable. No additional management support is needed unless otherwise documented below in the visit note. 

## 2016-05-06 NOTE — Progress Notes (Signed)
Phone: [phone]  Subjective:  Patient presents today for their annual physical. Chief complaint-noted.   See problem oriented charting- ROS- full  review of systems was completed and negative except occasional headaches. No chest pain. No headache or blurry vision. Some fatigue  The following were reviewed and entered/updated in epic: Past Medical History:  Diagnosis Date  . Allergy   . Arthritis   . Depression   . GERD (gastroesophageal reflux disease)   . Hypertension   . Low back pain    Patient Active Problem List   Diagnosis Date Noted  . Migraines 05/06/2016    Priority: Medium  . HTN (hypertension) 11/21/2011    Priority: Medium  . Depression 09/06/2006    Priority: Medium  . Family history of cancer of GI tract 12/06/2012    Priority: Low  . MENORRHAGIA 09/23/2009    Priority: Low  . PLANTAR FASCIITIS 09/20/2007    Priority: Low  . Osteoarthritis 09/19/2006    Priority: Low  . Allergic rhinitis 09/06/2006    Priority: Low  . GERD 09/06/2006    Priority: Low   Past Surgical History:  Procedure Laterality Date  . TUBAL LIGATION      Family History  Problem Relation Age of Onset  . Diabetes Father     mother  . Hyperlipidemia Father     mother  . Hypertension Father     mother  . Colon cancer Father     54s, and grandmother  . Cancer - Other Sister     duodenal  . Diabetes Mother   . Hypertension Mother   . Hyperlipidemia Mother   . Colon cancer Paternal Grandmother     Medications- reviewed and updated Current Outpatient Prescriptions  Medication Sig Dispense Refill  . amLODipine (NORVASC) 2.5 MG tablet Take 1 tablet (2.5 mg total) by mouth daily. 91 tablet 3  . bisoprolol-hydrochlorothiazide (ZIAC) 2.5-6.25 MG per tablet TAKE 1 TABLET BY MOUTH DAILY. 90 tablet 1  . butalbital-acetaminophen-caffeine (FIORICET) 50-325-40 MG tablet Take 1-2 tablets by mouth every 6 (six) hours as needed for headache. 10 tablet 0  . meloxicam (MOBIC) 7.5 MG  tablet Take 1 tablet (7.5 mg total) by mouth daily as needed for pain. 30 tablet 5  . pantoprazole (PROTONIX) 40 MG tablet TAKE 1 TABLET (40 MG TOTAL) BY MOUTH DAILY. 90 tablet 1  . PARoxetine (PAXIL) 10 MG tablet Take 1 tablet (10 mg total) by mouth daily. (Patient taking differently: Take 10 mg by mouth daily as needed (anxiety). ) 30 tablet 1   No current facility-administered medications for this visit.     Allergies-reviewed and updated No Known Allergies  Social History   Social History  . Marital status: Divorced    Spouse name: N/A  . Number of children: N/A  . Years of education: N/A   Social History Main Topics  . Smoking status: Never Smoker  . Smokeless tobacco: Never Used  . Alcohol use 0.0 - 1.2 oz/week  . Drug use: No  . Sexual activity: Yes    Partners: Male    Birth control/ protection: Surgical   Other Topics Concern  . None   Social History Narrative   Engaged with plans for 2017 (May 13th 2017) marriage. 2 children- son 79 (married) and daughter 46. No grandkids.       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 Oropharynx normal Neck: no thyromegaly CV: RRR no murmurs rubs or gallops Lungs: CTAB no crackles, wheeze, rhonchi Abdomen: soft/nontender/nondistended/normal bowel sounds. No rebound or guarding.  Ext: no edema Skin: warm, dry Neuro: grossly normal, moves all extremities, PERRLA  Assessment/Plan:  46 y.o. female presenting for annual physical.  Health Maintenance counseling: 1. Anticipatory guidance: Patient counseled regarding regular dental exams q6 months, eye exams -every other year. Glasses., wearing seatbelts.  2. Risk factor reduction:  Advised patient of need for regular  exercise and diet rich and fruits and vegetables to reduce risk of heart attack and stroke. Exercise- just restarted at the gym. Diet-trying to snack less. HS weight 180- set goals on 200. Has lost4 lbs!  Wt Readings from Last 3 Encounters:  05/06/16 255 lb 9.6 oz (115.9 kg)  03/04/16 259 lb 6.4 oz (117.7 kg)  01/13/16 258 lb (117 kg)  3. Immunizations/screenings/ancillary studies Immunization History  Administered Date(s) Administered  . Influenza Split 11/21/2011  . Influenza Whole 11/19/2007, 12/03/2008  . Influenza,inj,Quad PF,36+ Mos 11/21/2012, 12/24/2015  . Influenza-Unspecified 12/17/2014  . Td 02/21/2001  . Tdap 11/21/2011   4. Cervical cancer screening- 11 /2017 with 3 year repeat per Dr. Talbert Nan. ASCUS but HPV negative. Will get ultrasound due to heavy bleeding as well 5. Breast cancer screening-  breast exam with GYN and mammogram - needs to schedule. Last one about 3-4 years ago. Gave handout breast center 6. Colon cancer screening - refer again today- she did not return GI call last march. Family history in father in 31s of colon cancer. Last colonoscopy for patient was 2005.   Status of chronic or acute concerns   On protonix for GERD and controlled for most part  Depression Depression on paxil 10mg . She takes prn. Advised this is nto prn med. Has had some more fatigue. Will trial consistent use for at least 6 weeks of the paxil. Also intermittent depressed mood but honestly generally doing better. Some depressed mood. No anhedonia right now though.   Also discussed repeating labs at follow up given her wbc elevation but was getting over recent illness with most recent labs.   HTN (hypertension) htn- diastolic mild poor control on ziac and amlodipine initially and on repeat was controlled. Discussed weight loss to push diastolic even lower. If consistently closer top 90 may need med adjustment  Migraines Migraines- sparing fiorcet. #10 given in January- only uses if has  to. Mainly using [employer] migraine tablet and using this in combo with allergy medicine seems to be the only combo that works. Either by itself will not work but only works in combo to abort headache  Osteoarthritis On mobic for OA lumbar, bilateral knees and shoulders- needs weigh tloss. Usually 2-3 x a week  Return in about 4 months (around 09/05/2016) for 3-4 month weight, bp recheck. Also update bloodwork given slight elevation in infection fighting cel.  Orders Placed This Encounter  Procedures  . Ambulatory referral to Gastroenterology    Referral Priority:   Routine    Referral Type:   Consultation    Referral Reason:   Specialty Services Required    Number of Visits Requested:   1    Meds ordered this encounter  Medications  . amLODipine (NORVASC) 2.5  MG tablet    Sig: Take 1 tablet (2.5 mg total) by mouth daily.    Dispense:  91 tablet    Refill:  3   Return precautions advised.  Garret Reddish, MD

## 2016-05-06 NOTE — Assessment & Plan Note (Signed)
On mobic for OA lumbar, bilateral knees and shoulders- needs weigh tloss. Usually 2-3 x a week

## 2016-05-06 NOTE — Patient Instructions (Addendum)
We will call you within a week or two about your referral to GI. If you do not hear within 3 weeks, call the # we gave you  3-4 month weight, bp recheck. Also update bloodwork given slight elevation in infection fighting cells.

## 2016-05-06 NOTE — Assessment & Plan Note (Signed)
Migraines- sparing fiorcet. #10 given in January- only uses if has to. Mainly using [employer] migraine tablet and using this in combo with allergy medicine seems to be the only combo that works. Either by itself will not work but only works in combo to abort headache

## 2016-05-06 NOTE — Assessment & Plan Note (Signed)
htn- diastolic mild poor control on ziac and amlodipine initially and on repeat was controlled. Discussed weight loss to push diastolic even lower. If consistently closer top 90 may need med adjustment

## 2016-05-06 NOTE — Assessment & Plan Note (Addendum)
Depression on paxil 10mg . She takes prn. Advised this is nto prn med. Has had some more fatigue. Will trial consistent use for at least 6 weeks of the paxil. Also intermittent depressed mood but honestly generally doing better. Some depressed mood. No anhedonia right now though.   Also discussed repeating labs at follow up given her wbc elevation but was getting over recent illness with most recent labs.

## 2016-05-20 ENCOUNTER — Other Ambulatory Visit: Payer: Self-pay | Admitting: Family Medicine

## 2016-06-10 ENCOUNTER — Encounter (INDEPENDENT_AMBULATORY_CARE_PROVIDER_SITE_OTHER): Payer: Self-pay

## 2016-06-10 ENCOUNTER — Encounter: Payer: Self-pay | Admitting: Internal Medicine

## 2016-06-10 ENCOUNTER — Ambulatory Visit (INDEPENDENT_AMBULATORY_CARE_PROVIDER_SITE_OTHER): Payer: BLUE CROSS/BLUE SHIELD | Admitting: Internal Medicine

## 2016-06-10 ENCOUNTER — Ambulatory Visit: Payer: BLUE CROSS/BLUE SHIELD

## 2016-06-10 VITALS — BP 144/100 | HR 70 | Ht 67.5 in | Wt 261.0 lb

## 2016-06-10 DIAGNOSIS — Z6841 Body Mass Index (BMI) 40.0 and over, adult: Secondary | ICD-10-CM

## 2016-06-10 DIAGNOSIS — Z800 Family history of malignant neoplasm of digestive organs: Secondary | ICD-10-CM | POA: Diagnosis not present

## 2016-06-10 DIAGNOSIS — K219 Gastro-esophageal reflux disease without esophagitis: Secondary | ICD-10-CM

## 2016-06-10 NOTE — Progress Notes (Signed)
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 Rushie Chestnut regarding a family history of colon cancer, the need for colonoscopy, and chronic GERD. The patient reports that she underwent colonoscopy in 2005 (no details trying to retrieve report) because of a family history of colon cancer in her father (73s) grandmother, and maternal aunt. She has no lower GI complaints. She moves her bowels twice per week but has no associated symptoms. She does have chronic GERD for which she takes Protonix with breakthrough symptoms. No dysphagia. Occasional gas. She is chronically overweight but has no significant change recently. Review of outside laboratories from 04/28/2016 reveals normal comprehensive metabolic panel, lipid profile, and CBC with differential. Normal thyroid testing. Of interest, she also reports that her sister died of duodenal cancer. There has been no genetic testing.   REVIEW OF SYSTEMS:  All non-GI ROS negative except for sinus and allergy trouble, arthritis, back pain, visual change, headaches, night sweats  Past Medical History:  Diagnosis Date  . Allergy   . Arthritis   . Chronic headaches   . Depression   . GERD (gastroesophageal reflux disease)   . Hypertension   . Low back pain   . UTI (urinary tract infection)     Past Surgical History:  Procedure Laterality Date  . TUBAL LIGATION      Social History Tonya Suarez  reports that she has never smoked. She has never used smokeless tobacco. She reports that she drinks alcohol. She reports that she does not use drugs.  family history includes Cancer - Other in her sister; Colon cancer in her father and paternal grandmother; Diabetes in her father and mother; Hyperlipidemia in her father and mother; Hypertension in her father and mother.  No Known Allergies     PHYSICAL  EXAMINATION: Vital signs: BP (!) 144/100   Pulse 70   Ht 5' 7.5" (1.715 m)   Wt 261 lb (118.4 kg)   BMI 40.28 kg/m   Constitutional: Obese but generally well-appearing, no acute distress Psychiatric: alert and oriented x3, cooperative Eyes: extraocular movements intact, anicteric, conjunctiva pink Mouth: oral pharynx moist, no lesions Neck: supple no lymphadenopathy Cardiovascular: heart regular rate and rhythm, no murmur Lungs: clear to auscultation bilaterally Abdomen: soft, obese, nontender, nondistended, no obvious ascites, no peritoneal signs, normal bowel sounds, no organomegaly Rectal: Deferred until colonoscopy Extremities: no clubbing cyanosis or lower extremity edema bilaterally Skin: no lesions on visible extremities Neuro: No focal deficits. Normal DTRs  ASSESSMENT:  1. Chronic GERD. Breakthrough symptoms on PPI 2. Sister with a history of duodenal cancer 3. Strong family history of colon cancer. Father and 21s 4. Morbid obesity   PLAN:  1. Reflux precautions with attention to weight loss 2. Continue pantoprazole 40 mg daily 3. Upper endoscopy for chronic GERD with symptoms despite PPI and family history of duodenal cancer .higher than baseline risk The nature of the procedure, as well as the risks, benefits, and alternatives were carefully and thoroughly reviewed with the patient. Ample time for discussion and questions allowed. The patient understood, was satisfied, and agreed to proceed. 4. Screening colonoscopy. Higher than baseline risk due to morbid obesityThe nature of the procedure, as well as the risks, benefits, and alternatives were carefully and thoroughly reviewed with the patient. Ample time for discussion and questions allowed. The patient understood, was satisfied, and agreed to proceed.  Dr. Yong Channel  has received a copy of this consultation note

## 2016-06-10 NOTE — Patient Instructions (Signed)
We will call you to schedule your endoscopy/colonsocopy

## 2016-06-28 ENCOUNTER — Ambulatory Visit: Payer: BLUE CROSS/BLUE SHIELD

## 2016-06-29 ENCOUNTER — Encounter: Payer: Self-pay | Admitting: Internal Medicine

## 2016-07-30 ENCOUNTER — Other Ambulatory Visit: Payer: Self-pay | Admitting: Family Medicine

## 2016-08-31 ENCOUNTER — Ambulatory Visit (AMBULATORY_SURGERY_CENTER): Payer: Self-pay

## 2016-08-31 VITALS — Ht 70.0 in | Wt 256.0 lb

## 2016-08-31 DIAGNOSIS — Z1211 Encounter for screening for malignant neoplasm of colon: Secondary | ICD-10-CM

## 2016-08-31 MED ORDER — NA SULFATE-K SULFATE-MG SULF 17.5-3.13-1.6 GM/177ML PO SOLN: 1.0000 | Freq: Once | ORAL | 0 refills | Status: AC

## 2016-08-31 NOTE — Progress Notes (Signed)
Denies allergies to eggs or soy products. Denies complication of anesthesia or sedation. Denies use of weight loss medication. Denies use of O2.   Emmi instructions declined.  

## 2016-09-06 ENCOUNTER — Encounter: Payer: Self-pay | Admitting: Internal Medicine

## 2016-09-14 ENCOUNTER — Encounter: Payer: BLUE CROSS/BLUE SHIELD | Admitting: Internal Medicine

## 2016-09-16 ENCOUNTER — Encounter: Payer: Self-pay | Admitting: Internal Medicine

## 2016-09-16 ENCOUNTER — Ambulatory Visit (AMBULATORY_SURGERY_CENTER): Payer: BLUE CROSS/BLUE SHIELD | Admitting: Internal Medicine

## 2016-09-16 VITALS — BP 130/77 | HR 70 | Temp 97.5°F | Resp 11 | Ht 70.0 in | Wt 256.0 lb

## 2016-09-16 DIAGNOSIS — Z1211 Encounter for screening for malignant neoplasm of colon: Secondary | ICD-10-CM

## 2016-09-16 DIAGNOSIS — Z1212 Encounter for screening for malignant neoplasm of rectum: Secondary | ICD-10-CM | POA: Diagnosis not present

## 2016-09-16 DIAGNOSIS — K219 Gastro-esophageal reflux disease without esophagitis: Secondary | ICD-10-CM

## 2016-09-16 DIAGNOSIS — Z800 Family history of malignant neoplasm of digestive organs: Secondary | ICD-10-CM

## 2016-09-16 DIAGNOSIS — D122 Benign neoplasm of ascending colon: Secondary | ICD-10-CM

## 2016-09-16 MED ORDER — OMEPRAZOLE 40 MG PO CPDR: 40.0000 mg | DELAYED_RELEASE_CAPSULE | Freq: Every day | ORAL | 3 refills | Status: DC

## 2016-09-16 MED ORDER — PANTOPRAZOLE SODIUM 40 MG PO TBEC: DELAYED_RELEASE_TABLET | ORAL | 1 refills | Status: DC

## 2016-09-16 MED ORDER — SODIUM CHLORIDE 0.9 % IV SOLN: 500.0000 mL | INTRAVENOUS | Status: AC

## 2016-09-16 NOTE — Op Note (Signed)
[facility] Patient Name: Tonya Suarez Procedure Date: 09/16/2016 1:46 PM MRN: [id number] Endoscopist: Docia Chuck. Henrene Pastor , MD Age: 46 Referring MD:  Date of Birth: 03-16-1970 Gender: Female Account #: [id number] Procedure:                Upper GI endoscopy Indications:              Esophageal reflux. Note: Sister died from duodenal                            cancer Medicines:                Monitored Anesthesia Care Procedure:                Pre-Anesthesia Assessment:                           - Prior to the procedure, a History and Physical                            was performed, and patient medications and                            allergies were reviewed. The patient's tolerance of                            previous anesthesia was also reviewed. The risks                            and benefits of the procedure and the sedation                            options and risks were discussed with the patient.                            All questions were answered, and informed consent                            was obtained. Prior Anticoagulants: The patient has                            taken no previous anticoagulant or antiplatelet                            agents. ASA Grade Assessment: II - A patient with                            mild systemic disease. After reviewing the risks                            and benefits, the patient was deemed in                            satisfactory condition to undergo the procedure.  After obtaining informed consent, the endoscope was                            passed under direct vision. Throughout the                            procedure, the patient's blood pressure, pulse, and                            oxygen saturations were monitored continuously. The                            Endoscope was introduced through the mouth, and                            advanced to the third part of duodenum. The upper                             GI endoscopy was accomplished without difficulty.                            The patient tolerated the procedure well. Scope In: Scope Out: Findings:                 The esophagus was normal.                           The stomach was normal.                           The examined duodenum was normal.                           The cardia and gastric fundus were normal on                            retroflexion. Complications:            No immediate complications. Estimated Blood Loss:     Estimated blood loss: none. Impression:               - Normal esophagus.                           - Normal stomach.                           - Normal examined duodenum.                           - No specimens collected. Recommendation:           - Patient has a contact number available for                            emergencies. The signs and symptoms of potential  delayed complications were discussed with the                            patient. Return to normal activities tomorrow.                            Written discharge instructions were provided to the                            patient.                           - Resume previous diet.                           - Continue present medications. Docia Chuck. Henrene Pastor, MD 09/16/2016 2:10:08 PM This report has been signed electronically.

## 2016-09-16 NOTE — Progress Notes (Signed)
Called to room to assist during endoscopic procedure.  Patient ID and intended procedure confirmed with present staff. Received instructions for my participation in the procedure from the performing physician.  

## 2016-09-16 NOTE — Progress Notes (Signed)
Dental advisory given to patientAlert and oriented x3, pleased with MAC, report to RN  

## 2016-09-16 NOTE — Progress Notes (Signed)
Pt's states no medical or surgical changes since previsit or office visit. 

## 2016-09-16 NOTE — Patient Instructions (Signed)
YOU HAD AN ENDOSCOPIC PROCEDURE TODAY AT [facility]:   Refer to the procedure report that was given to you for any specific questions about what was found during the examination.  If the procedure report does not answer your questions, please call your gastroenterologist to clarify.  If you requested that your care partner not be given the details of your procedure findings, then the procedure report has been included in a sealed envelope for you to review at your convenience later.  YOU SHOULD EXPECT: Some feelings of bloating in the abdomen. Passage of more gas than usual.  Walking can help get rid of the air that was put into your GI tract during the procedure and reduce the bloating. If you had a lower endoscopy (such as a colonoscopy or flexible sigmoidoscopy) you may notice spotting of blood in your stool or on the toilet paper. If you underwent a bowel prep for your procedure, you may not have a normal bowel movement for a few days.  Please Note:  You might notice some irritation and congestion in your nose or some drainage.  This is from the oxygen used during your procedure.  There is no need for concern and it should clear up in a day or so.  SYMPTOMS TO REPORT IMMEDIATELY:   Following lower endoscopy (colonoscopy or flexible sigmoidoscopy):  Excessive amounts of blood in the stool  Significant tenderness or worsening of abdominal pains  Swelling of the abdomen that is new, acute  Fever of 100F or higher   Following upper endoscopy (EGD)  Vomiting of blood or coffee ground material  New chest pain or pain under the shoulder blades  Painful or persistently difficult swallowing  New shortness of breath  Fever of 100F or higher  Black, tarry-looking stools  For urgent or emergent issues, a gastroenterologist can be reached at any hour by calling [phone].   DIET:  We do recommend a small meal at first, but then you may proceed to your regular diet.  Drink  plenty of fluids but you should avoid alcoholic beverages for 24 hours. Try to increase the fiber in your diet, and drink plenty of water.  ACTIVITY:  You should plan to take it easy for the rest of today and you should NOT DRIVE or use heavy machinery until tomorrow (because of the sedation medicines used during the test).    FOLLOW UP: Our staff will call the number listed on your records the next business day following your procedure to check on you and address any questions or concerns that you may have regarding the information given to you following your procedure. If we do not reach you, we will leave a message.  However, if you are feeling well and you are not experiencing any problems, there is no need to return our call.  We will assume that you have returned to your regular daily activities without incident.  If any biopsies were taken you will be contacted by phone or by letter within the next 1-3 weeks.  Please call us at [phone] if you have not heard about the biopsies in 3 weeks.    SIGNATURES/CONFIDENTIALITY: You and/or your care partner have signed paperwork which will be entered into your electronic medical record.  These signatures attest to the fact that that the information above on your After Visit Summary has been reviewed and is understood.  Full responsibility of the confidentiality of this discharge information lies with you and/or your  care-partner.  We will see you again in 5 yearsw.

## 2016-09-16 NOTE — Op Note (Signed)
[place] Patient Name: Tonya Suarez Procedure Date: 09/16/2016 1:46 PM MRN: [id number] Endoscopist: Docia Chuck. Henrene Pastor , MD Age: 46 Referring MD:  Date of Birth: Mar 26, 1970 Gender: Female Account #: [id number] Procedure:                Colonoscopy, with snare polypectomy x 1 Indications:              Screening in patient at increased risk: Colorectal                            cancer in father before age 67 Medicines:                Monitored Anesthesia Care Procedure:                Pre-Anesthesia Assessment:                           - Prior to the procedure, a History and Physical                            was performed, and patient medications and                            allergies were reviewed. The patient's tolerance of                            previous anesthesia was also reviewed. The risks                            and benefits of the procedure and the sedation                            options and risks were discussed with the patient.                            All questions were answered, and informed consent                            was obtained. Prior Anticoagulants: The patient has                            taken no previous anticoagulant or antiplatelet                            agents. ASA Grade Assessment: II - A patient with                            mild systemic disease. After reviewing the risks                            and benefits, the patient was deemed in                            satisfactory condition to undergo the procedure.  After obtaining informed consent, the colonoscope                            was passed under direct vision. Throughout the                            procedure, the patient's blood pressure, pulse, and                            oxygen saturations were monitored continuously. The                            Colonoscope was introduced through the anus and                             advanced to the the cecum, identified by                            appendiceal orifice and ileocecal valve. The                            ileocecal valve, appendiceal orifice, and rectum                            were photographed. The quality of the bowel                            preparation was excellent. The colonoscopy was                            performed without difficulty. The patient tolerated                            the procedure well. The bowel preparation used was                            SUPREP. Scope In: 1:49:27 PM Scope Out: 2:01:45 PM Scope Withdrawal Time: 0 hours 10 minutes 31 seconds  Total Procedure Duration: 0 hours 12 minutes 18 seconds  Findings:                 A 3 mm polyp was found in the ascending colon. The                            polyp was removed with a cold snare. Resection and                            retrieval were complete.                           Internal hemorrhoids were found during retroflexion.                           The exam was otherwise without abnormality on  direct and retroflexion views. Complications:            No immediate complications. Estimated blood loss:                            None. Estimated Blood Loss:     Estimated blood loss: none. Impression:               - One 3 mm polyp in the ascending colon, removed                            with a cold snare. Resected and retrieved.                           - Internal hemorrhoids.                           - The examination was otherwise normal on direct                            and retroflexion views. Recommendation:           - Repeat colonoscopy in 5 years for                            surveillance(Family history).                           - Patient has a contact number available for                            emergencies. The signs and symptoms of potential                            delayed complications were discussed with the                             patient. Return to normal activities tomorrow.                            Written discharge instructions were provided to the                            patient.                           - Resume previous diet.                           - Continue present medications.                           - Await pathology results. Docia Chuck. Henrene Pastor, MD 09/16/2016 2:05:10 PM This report has been signed electronically.

## 2016-09-19 ENCOUNTER — Telehealth: Payer: Self-pay | Admitting: *Deleted

## 2016-09-19 ENCOUNTER — Emergency Department (HOSPITAL_COMMUNITY)
Admission: EM | Admit: 2016-09-19 | Discharge: 2016-09-19 | Disposition: A | Discharge status: Home / Self Care | Payer: BLUE CROSS/BLUE SHIELD | Attending: Emergency Medicine | Admitting: Emergency Medicine

## 2016-09-19 ENCOUNTER — Encounter (HOSPITAL_COMMUNITY): Payer: Self-pay

## 2016-09-19 ENCOUNTER — Emergency Department (HOSPITAL_COMMUNITY): Payer: BLUE CROSS/BLUE SHIELD

## 2016-09-19 DIAGNOSIS — M791 Myalgia: Secondary | ICD-10-CM | POA: Diagnosis not present

## 2016-09-19 DIAGNOSIS — Y999 Unspecified external cause status: Secondary | ICD-10-CM | POA: Insufficient documentation

## 2016-09-19 DIAGNOSIS — Z79899 Other long term (current) drug therapy: Secondary | ICD-10-CM | POA: Diagnosis not present

## 2016-09-19 DIAGNOSIS — S161XXA Strain of muscle, fascia and tendon at neck level, initial encounter: Secondary | ICD-10-CM | POA: Diagnosis not present

## 2016-09-19 DIAGNOSIS — Y9389 Activity, other specified: Secondary | ICD-10-CM | POA: Diagnosis not present

## 2016-09-19 DIAGNOSIS — S199XXA Unspecified injury of neck, initial encounter: Secondary | ICD-10-CM | POA: Diagnosis present

## 2016-09-19 DIAGNOSIS — M542 Cervicalgia: Secondary | ICD-10-CM | POA: Diagnosis not present

## 2016-09-19 DIAGNOSIS — I10 Essential (primary) hypertension: Secondary | ICD-10-CM | POA: Diagnosis not present

## 2016-09-19 DIAGNOSIS — Y92410 Unspecified street and highway as the place of occurrence of the external cause: Secondary | ICD-10-CM | POA: Diagnosis not present

## 2016-09-19 MED ORDER — CYCLOBENZAPRINE HCL 10 MG PO TABS: 10.0000 mg | ORAL_TABLET | Freq: Three times a day (TID) | ORAL | 0 refills | Status: DC | PRN

## 2016-09-19 MED ORDER — HYDROCODONE-ACETAMINOPHEN 5-325 MG PO TABS
1.0000 | ORAL_TABLET | Freq: Once | ORAL | Status: AC
  Administered 2016-09-19: 1 via ORAL
  Filled 2016-09-19: qty 1
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
## 2016-09-19 NOTE — Discharge Instructions (Signed)
This is likely a musculoskeletal sprain. Please apply heat and alternate with ice. Warm soaks in Epsom salt. Continue taking her meloxicam.  Please the the Flexeril for muscle relaxation. This medication will make you drowsy so avoid situation that could place you in danger.   If symptoms persist after one week may need to follow-up with her primary care doctor and have repeat imaging.
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
## 2016-09-19 NOTE — Telephone Encounter (Signed)
  Follow up Call-  Call back number 09/16/2016  Post procedure Call Back phone  # [phone]  Permission to leave phone message Yes  Some recent data might be hidden     Patient questions:  Do you have a fever, pain , or abdominal swelling? No. Pain Score  0 *  Have you tolerated food without any problems? Yes.    Have you been able to return to your normal activities? Yes.    Do you have any questions about your discharge instructions: Diet   Yes.   Medications  No. Follow up visit  No.  Do you have questions or concerns about your Care? No.  Actions: * If pain score is 4 or above: No action needed, pain <4. Patient did not have questions regarding diet.

## 2016-09-19 NOTE — ED Notes (Signed)
Patient transported to X-ray 

## 2016-09-21 ENCOUNTER — Encounter: Payer: Self-pay | Admitting: Internal Medicine

## 2016-09-23 ENCOUNTER — Encounter: Payer: Self-pay | Admitting: Family Medicine

## 2016-09-23 DIAGNOSIS — Z86010 Personal history of colonic polyps: Secondary | ICD-10-CM | POA: Insufficient documentation

## 2016-11-02 ENCOUNTER — Encounter: Payer: Self-pay | Admitting: Podiatry

## 2016-11-02 ENCOUNTER — Ambulatory Visit (INDEPENDENT_AMBULATORY_CARE_PROVIDER_SITE_OTHER): Payer: BLUE CROSS/BLUE SHIELD | Admitting: Podiatry

## 2016-11-02 DIAGNOSIS — B353 Tinea pedis: Secondary | ICD-10-CM | POA: Diagnosis not present

## 2016-11-02 MED ORDER — TERBINAFINE HCL 250 MG PO TABS: 250.0000 mg | ORAL_TABLET | Freq: Every day | ORAL | 0 refills | Status: DC

## 2016-11-02 MED ORDER — CLOTRIMAZOLE-BETAMETHASONE 1-0.05 % EX CREA: 1.0000 "application " | TOPICAL_CREAM | Freq: Two times a day (BID) | CUTANEOUS | 2 refills | Status: DC

## 2016-11-04 NOTE — Progress Notes (Signed)
   Subjective: Patient is a 46 year old female presenting today as a new patient for evaluation of pain to the medial and lateral borders of bilateral great toes. Patient is concerned for possible ingrown nails. She also reports a burning, itching sensation between the fourth and fifth toes of bilateral feet. She reports wearing steel toed shoes at work which may exacerbate her symptoms. Patient states that the pain has been present for a few weeks now. Patient presents today for further treatment and evaluation.  Past Medical History:  Diagnosis Date  . Allergy   . Arthritis   . Chronic headaches   . Depression   . GERD (gastroesophageal reflux disease)   . Hypertension   . Low back pain   . UTI (urinary tract infection)     Objective:  General: Well developed, nourished, in no acute distress, alert and oriented x3   Dermatology: Skin is warm, dry and supple bilateral. Medial and lateral borders of bilateral great toes appear to be erythematous with evidence of ingrowing nails. Pain on palpation noted to the border of the nail fold. The remaining nails appear unremarkable at this time. There are no open sores, lesions. Pruritus to the interdigital areas to bilateral feet with hyperkeratosis.  Vascular: Dorsalis Pedis artery and Posterior Tibial artery pedal pulses palpable. No lower extremity edema noted.   Neruologic: Grossly intact via light touch bilateral.  Musculoskeletal: Muscular strength within normal limits in all groups bilateral. Normal range of motion noted to all pedal and ankle joints.   Assesement: #1 Paronychia with ingrowing nail medial and lateral borders of bilateral great toes #2 Pain in toe #3 Incurvated nail #4 Tinea Pedis  Plan of Care:  1. Patient evaluated.  2. Prescription for Lamisil 250 mg #28 given to patient. 3. Prescription for Lotrisone Cream given to patient.  4. Return to clinic in 4 weeks to treat bilateral ingrown toenails.  Going to [facility] for family vacation in 2 weeks.   Edrick Kins, DPM [facility]  Dr. Edrick Kins, [address]                                        [place], [place] [address]                Office [phone]  Fax [phone]

## 2016-11-14 ENCOUNTER — Other Ambulatory Visit: Payer: Self-pay | Admitting: Family Medicine

## 2016-11-23 DIAGNOSIS — H524 Presbyopia: Secondary | ICD-10-CM | POA: Diagnosis not present

## 2016-11-30 ENCOUNTER — Ambulatory Visit: Payer: BLUE CROSS/BLUE SHIELD | Admitting: Podiatry

## 2016-12-01 ENCOUNTER — Telehealth: Payer: Self-pay | Admitting: *Deleted

## 2016-12-01 DIAGNOSIS — Z23 Encounter for immunization: Secondary | ICD-10-CM | POA: Diagnosis not present

## 2016-12-01 NOTE — Telephone Encounter (Signed)
Refill request for terbinafine. Dr. Amalia Hailey states pt needs to be reevaluated prior to future refills. Return fax denying.

## 2016-12-12 DIAGNOSIS — H40003 Preglaucoma, unspecified, bilateral: Secondary | ICD-10-CM | POA: Diagnosis not present

## 2016-12-16 ENCOUNTER — Ambulatory Visit (INDEPENDENT_AMBULATORY_CARE_PROVIDER_SITE_OTHER): Payer: BLUE CROSS/BLUE SHIELD | Admitting: Podiatry

## 2016-12-16 ENCOUNTER — Encounter: Payer: Self-pay | Admitting: Podiatry

## 2016-12-16 DIAGNOSIS — B351 Tinea unguium: Secondary | ICD-10-CM | POA: Diagnosis not present

## 2016-12-16 DIAGNOSIS — L608 Other nail disorders: Secondary | ICD-10-CM | POA: Diagnosis not present

## 2016-12-16 DIAGNOSIS — L600 Ingrowing nail: Secondary | ICD-10-CM | POA: Diagnosis not present

## 2016-12-16 MED ORDER — TRAMADOL HCL 50 MG PO TABS: 50.0000 mg | ORAL_TABLET | Freq: Two times a day (BID) | ORAL | 0 refills | Status: DC | PRN

## 2016-12-16 MED ORDER — CEPHALEXIN 500 MG PO CAPS: 500.0000 mg | ORAL_CAPSULE | Freq: Three times a day (TID) | ORAL | 0 refills | Status: DC

## 2016-12-16 NOTE — Patient Instructions (Signed)

## 2016-12-20 DIAGNOSIS — L600 Ingrowing nail: Secondary | ICD-10-CM | POA: Insufficient documentation

## 2016-12-20 NOTE — Progress Notes (Signed)
Subjective: 46 year old female presenting office today for concerns of chronic ingrown toenails to both of her big toes which been ongoing for several years. She proves she saw Dr. Amalia Hailey for this however she was going on vacation. She presents today requesting the nail corners be removed. She is attended numerous conservative treatment for a significant long-term improvement. She also states her nails are thickened and discolored. She was on a short course of Lamisil for tinea pedis. Denies any systemic complaints such as fevers, chills, nausea, vomiting. No acute changes since last appointment, and no other complaints at this time.   Objective: AAO x3, NAD DP/PT pulses palpable bilaterally, CRT less than 3 seconds There is incurvation present on both the medial and lateral aspects of bilateral hallux toenails with localized edema on the nail corners. There is no drainage or pus expressed. There is tenderness to palpation on the nail corners bilateral hallux. There is no ascending cellulitis. Is no clinical signs of infection noted otherwise. The nails appear to be somewhat discolored, dystrophic and hypertrophic. There is no severe tinea pedis present today. There is no other open lesions or pre-ulcerative lesions. There is no pain with calf compression, swelling, warmth, erythema.  Assessment: 46 year old female with bilateral chronic ingrown toenails; onychodystrophy  Plan: -Treatment options discussed including all alternatives, risks, and complications -Etiology of symptoms were discussed -At this time, the patient is requesting partial nail removal with chemical matricectomy to the symptomatic portion of the nail. Risks and complications were discussed with the patient for which they understand and written consent was obtained for he procedure. Under sterile conditions a total of 3 mL of a mixture of 2% lidocaine plain and 0.5% Marcaine plain was infiltrated in a hallux block fashion. Once  anesthetized, the skin was prepped in sterile fashion. A tourniquet was then applied. Next the medial and alteral aspect of hallux nail border was then sharply excised making sure to remove the entire offending nail border. Once the nails were ensured to be removed area was debrided and the underlying skin was intact. There is no purulence identified in the procedure. Next phenol was then applied under standard conditions and copiously irrigated. Silvadene was applied. A dry sterile dressing was applied. After application of the dressing the tourniquet was removed and there is found to be an immediate capillary refill time to the digit. The patient tolerated the procedure well any complications. Post procedure instructions were discussed the patient for which he verbally understood. Follow-up in one week for nail check or sooner if any problems are to arise. Discussed signs/symptoms of infection and directed to call the office immediately should any occur or go directly to the emergency room. In the meantime, encouraged to call the office with any questions, concerns, changes symptoms. -Nails sent to [facility] for evaluation of onychomycosis.   Celesta Gentile, DPM

## 2016-12-29 ENCOUNTER — Ambulatory Visit (INDEPENDENT_AMBULATORY_CARE_PROVIDER_SITE_OTHER): Payer: BLUE CROSS/BLUE SHIELD | Admitting: Podiatry

## 2016-12-29 DIAGNOSIS — L600 Ingrowing nail: Secondary | ICD-10-CM

## 2016-12-29 DIAGNOSIS — L603 Nail dystrophy: Secondary | ICD-10-CM | POA: Diagnosis not present

## 2016-12-29 NOTE — Patient Instructions (Addendum)
You can mix 3 tablespoons of coconut oil and 10-15 drops of tea tree oil together . Apply to nails once a day  Continue soaking in epsom salts twice a day followed by antibiotic ointment and a band-aid. Can leave uncovered at night. Continue this until completely healed.  If the area has not healed in 2 weeks, call the office for follow-up appointment, or sooner if any problems arise.  Monitor for any signs/symptoms of infection. Call the office immediately if any occur or go directly to the emergency room. Call with any questions/concerns.

## 2017-01-01 NOTE — Progress Notes (Signed)
Subjective: Tonya Suarez is a 46 y.o.  female returns to office today for follow up evaluation after having bilatearl Hallux medial and lateral partial nail avulsion performed. Patient has been soaking using epsom salts and applying topical antibiotic covered with bandaid daily.  She denies any significant pain and overall they are feeling much better.  She has some clear drainage but denies any pus.  Overall she states that she is doing well and she has no concerns.  Patient denies fevers, chills, nausea, vomiting. Denies any calf pain, chest pain, SOB.   Objective:  Vitals: Reviewed  General: Well developed, nourished, in no acute distress, alert and oriented x3   Dermatology: Skin is warm, dry and supple bilateral.  Bilateral hallux nail border appears to be clean, dry, with mild granular tissue and surrounding scab. There is no surrounding erythema, edema, drainage/purulence. The remaining nails appear unremarkable at this time. There are no other lesions or other signs of infection present.  Neurovascular status: Intact. No lower extremity swelling; No pain with calf compression bilateral.  Musculoskeletal: No tenderness to palpation of the medial and lateral hallux nail folds. Muscular strength within normal limits bilateral.   Assesement and Plan: S/p partial nail avulsion, doing well.   -Continue soaking in epsom salts twice a day followed by antibiotic ointment and a band-aid. Can leave uncovered at night. Continue this until completely healed.  -We discussed new culture results which did not reveal any onychomycosis but there was injury to the nail.  We discussed treatment options for this.  She can start with more natural things I discussed the coconut oil/tea tree oil mixture.  Also discussed possible topical antifungal. -If the area has not healed in 2 weeks, call the office for follow-up appointment, or sooner if any problems arise.  -Monitor for any signs/symptoms of  infection. Call the office immediately if any occur or go directly to the emergency room. Call with any questions/concerns.  Celesta Gentile, DPM

## 2017-01-27 ENCOUNTER — Other Ambulatory Visit: Payer: Self-pay

## 2017-01-27 MED ORDER — MELOXICAM 7.5 MG PO TABS: 7.5000 mg | ORAL_TABLET | Freq: Every day | ORAL | 0 refills | Status: DC | PRN

## 2017-03-30 ENCOUNTER — Telehealth: Payer: Self-pay | Admitting: *Deleted

## 2017-03-30 MED ORDER — HYDROCHLOROTHIAZIDE 25 MG PO TABS: 25.0000 mg | ORAL_TABLET | Freq: Every day | ORAL | 3 refills | Status: DC

## 2017-03-30 NOTE — Telephone Encounter (Signed)
Left detailed message on personal voicemail, that Dr. Yong Channel is going to discontinue Bisoprolol-HCTZ being as it is on long term backorder from the pharmacy and start you on HCTZ 25 mg one tablet daily and then follow up with him in 2-3 weeks. Rx was sent to your pharmacy. Any questions please call office.

## 2017-04-22 DIAGNOSIS — Z1231 Encounter for screening mammogram for malignant neoplasm of breast: Secondary | ICD-10-CM | POA: Diagnosis not present

## 2017-06-06 ENCOUNTER — Other Ambulatory Visit: Payer: Self-pay | Admitting: Family Medicine

## 2017-06-06 ENCOUNTER — Other Ambulatory Visit: Payer: Self-pay

## 2017-06-06 DIAGNOSIS — I10 Essential (primary) hypertension: Secondary | ICD-10-CM

## 2017-06-06 MED ORDER — MELOXICAM 7.5 MG PO TABS: 7.5000 mg | ORAL_TABLET | Freq: Every day | ORAL | 0 refills | Status: DC | PRN

## 2017-06-06 MED ORDER — AMLODIPINE BESYLATE 2.5 MG PO TABS: 2.5000 mg | ORAL_TABLET | Freq: Every day | ORAL | 0 refills | Status: DC

## 2017-06-27 ENCOUNTER — Other Ambulatory Visit: Payer: Self-pay | Admitting: Family Medicine

## 2017-06-30 ENCOUNTER — Encounter: Payer: BLUE CROSS/BLUE SHIELD | Admitting: Family Medicine

## 2017-07-12 ENCOUNTER — Encounter: Payer: Self-pay | Admitting: Family Medicine

## 2017-07-12 ENCOUNTER — Ambulatory Visit (INDEPENDENT_AMBULATORY_CARE_PROVIDER_SITE_OTHER): Payer: BLUE CROSS/BLUE SHIELD | Admitting: Family Medicine

## 2017-07-12 VITALS — BP 116/84 | HR 80 | Temp 98.6°F | Ht 70.0 in | Wt 254.6 lb

## 2017-07-12 DIAGNOSIS — Z0000 Encounter for general adult medical examination without abnormal findings: Secondary | ICD-10-CM | POA: Diagnosis not present

## 2017-07-12 DIAGNOSIS — M159 Polyosteoarthritis, unspecified: Secondary | ICD-10-CM

## 2017-07-12 DIAGNOSIS — I10 Essential (primary) hypertension: Secondary | ICD-10-CM

## 2017-07-12 DIAGNOSIS — M8949 Other hypertrophic osteoarthropathy, multiple sites: Secondary | ICD-10-CM

## 2017-07-12 DIAGNOSIS — F3341 Major depressive disorder, recurrent, in partial remission: Secondary | ICD-10-CM | POA: Diagnosis not present

## 2017-07-12 DIAGNOSIS — G43909 Migraine, unspecified, not intractable, without status migrainosus: Secondary | ICD-10-CM

## 2017-07-12 DIAGNOSIS — B9689 Other specified bacterial agents as the cause of diseases classified elsewhere: Secondary | ICD-10-CM | POA: Diagnosis not present

## 2017-07-12 DIAGNOSIS — M150 Primary generalized (osteo)arthritis: Secondary | ICD-10-CM | POA: Diagnosis not present

## 2017-07-12 DIAGNOSIS — J329 Chronic sinusitis, unspecified: Secondary | ICD-10-CM | POA: Diagnosis not present

## 2017-07-12 DIAGNOSIS — J301 Allergic rhinitis due to pollen: Secondary | ICD-10-CM | POA: Diagnosis not present

## 2017-07-12 DIAGNOSIS — K219 Gastro-esophageal reflux disease without esophagitis: Secondary | ICD-10-CM

## 2017-07-12 LAB — CBC WITH DIFFERENTIAL/PLATELET
Basophils Absolute: 0 10*3/uL (ref 0.0–0.1)
Basophils Relative: 0.5 % (ref 0.0–3.0)
Eosinophils Absolute: 0.2 10*3/uL (ref 0.0–0.7)
Eosinophils Relative: 2.7 % (ref 0.0–5.0)
HCT: 41.8 % (ref 36.0–46.0)
Hemoglobin: 14.9 g/dL (ref 12.0–15.0)
Lymphocytes Relative: 34.5 % (ref 12.0–46.0)
Lymphs Abs: 3.1 10*3/uL (ref 0.7–4.0)
MCHC: 35.6 g/dL (ref 30.0–36.0)
MCV: 87.5 fl (ref 78.0–100.0)
Monocytes Absolute: 0.4 10*3/uL (ref 0.1–1.0)
Monocytes Relative: 4.3 % (ref 3.0–12.0)
Neutro Abs: 5.2 10*3/uL (ref 1.4–7.7)
Neutrophils Relative %: 58 % (ref 43.0–77.0)
Platelets: 275 10*3/uL (ref 150.0–400.0)
RBC: 4.78 Mil/uL (ref 3.87–5.11)
RDW: 14.5 % (ref 11.5–15.5)
WBC: 9 10*3/uL (ref 4.0–10.5)

## 2017-07-12 LAB — COMPREHENSIVE METABOLIC PANEL
ALT: 43 U/L — ABNORMAL HIGH (ref 0–35)
AST: 60 U/L — ABNORMAL HIGH (ref 0–37)
Albumin: 4 g/dL (ref 3.5–5.2)
Alkaline Phosphatase: 62 U/L (ref 39–117)
BUN: 12 mg/dL (ref 6–23)
CO2: 32 mEq/L (ref 19–32)
Calcium: 9.9 mg/dL (ref 8.4–10.5)
Chloride: 100 mEq/L (ref 96–112)
Creatinine, Ser: 0.89 mg/dL (ref 0.40–1.20)
GFR: 86.74 mL/min (ref >60.00)
Glucose, Bld: 115 mg/dL — ABNORMAL HIGH (ref 70–99)
Potassium: 3.9 mEq/L (ref 3.5–5.1)
Sodium: 138 mEq/L (ref 135–145)
Total Bilirubin: 0.7 mg/dL (ref 0.2–1.2)
Total Protein: 7.2 g/dL (ref 6.0–8.3)

## 2017-07-12 LAB — LIPID PANEL
Cholesterol: 142 mg/dL (ref 0–200)
HDL: 33.1 mg/dL — ABNORMAL LOW (ref >39.00)
LDL Cholesterol: 91 mg/dL (ref 0–99)
NonHDL: 109.02
Total CHOL/HDL Ratio: 4
Triglycerides: 91 mg/dL (ref 0.0–149.0)
VLDL: 18.2 mg/dL (ref 0.0–40.0)

## 2017-07-12 MED ORDER — DOXYCYCLINE HYCLATE 100 MG PO TABS: 100.0000 mg | ORAL_TABLET | Freq: Two times a day (BID) | ORAL | 0 refills | Status: AC

## 2017-07-12 MED ORDER — PAROXETINE HCL 10 MG PO TABS: 10.0000 mg | ORAL_TABLET | Freq: Every day | ORAL | 1 refills | Status: DC

## 2017-07-12 NOTE — Patient Instructions (Addendum)
Sign release of information at the check out desk for last mammogram from [facility] doxycycline for 7 days for bacterial sinusitis. See [place] back if not improved in 10 days or symptoms worsen  Start Paxil back-see directions below about antidepressants.  Since your depression is reasonably controlled it is okay to follow-up in 6 months-if you feel like symptoms worsen or do not improve further back on medicine see me back sooner.  Please stop by lab before you go if you are fasting  If not fasting Schedule a lab visit at the check out desk within 2 weeks. Return for future fasting labs meaning nothing but water after midnight please. Ok to take your medications with water.   Lets each other every 6 months so we can recheck your kidney function on the meloxicam.  If kidney function worsens in the future we would need to stop this medication.  Taking the medicine as directed and not missing any doses is one of the best things you can do to treat your depression.  Here are some things to keep in mind:  1) Side effects (stomach upset, some increased anxiety) may happen before you notice a benefit.  These side effects typically go away over time. 2) Changes to your dose of medicine or a change in medication all together is sometimes necessary 3) Most people need to be on medication at least 6-12 months 4) Many people will notice an improvement within two weeks but the full effect of the medication can take up to 4-6 weeks 5) Stopping the medication when you start feeling better often results in a return of symptoms 6) If you start having thoughts of hurting yourself or others after starting this medicine, call our office immediately at [phone] or seek care through 911.

## 2017-07-12 NOTE — Addendum Note (Signed)
Addended by: Kayren Eaves T on: 07/12/2017 11:14 AM   Modules accepted: Orders

## 2017-07-12 NOTE — Assessment & Plan Note (Signed)
S: PHQ 9 of 4 today.  Last year she was on Paxil 10 mg but was only taking as needed-I encouraged her to take it daily. Stressors have increased with dad with lung cancer and mom may go on dialysis Depression screen [facility] 2/9 07/12/2017  Decreased Interest 2  Down, Depressed, Hopeless 0  PHQ - 2 Score 2  Altered sleeping 1  Tired, decreased energy 1  Change in appetite 0  Feeling bad or failure about yourself  0  Trouble concentrating 0  Moving slowly or fidgety/restless 0  Suicidal thoughts 0  PHQ-9 Score 4  Difficult doing work/chores Somewhat difficult  A/P: we will add paxil 10mg  back in with stressors and the "somewhat difficult rating"

## 2017-07-12 NOTE — Assessment & Plan Note (Signed)
migraines-continues to use sparing Fioricet.  Most of the time her headaches are controlled with [employer] migraine tablet along with allergy medicine.  Seems to get more headaches when gets sinus irritation.

## 2017-07-12 NOTE — Assessment & Plan Note (Signed)
Hypertension is controlled on amlodipine 2.5 mg and HCTZ 25 mg

## 2017-07-12 NOTE — Assessment & Plan Note (Signed)
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  We discussed the importance of weight loss to take stress of these joints. We discussed q6 month visits to check on kidneys while on mobic especiall with family history of kidney disease in mother

## 2017-07-12 NOTE — Progress Notes (Signed)
Phone: [phone]  Subjective:  Patient presents today for their annual physical. Chief complaint-noted.   See problem oriented charting- ROS- full  review of systems was completed and negative except for: Fatigue, congestion, ear pain, runny nose, sinus pressure, sneezing, voice change, eye pain, no allergies, headaches  The following were reviewed and entered/updated in epic: Past Medical History:  Diagnosis Date  . Allergy   . Arthritis   . Chronic headaches   . Depression   . GERD (gastroesophageal reflux disease)   . Hypertension   . Low back pain   . UTI (urinary tract infection)    Patient Active Problem List   Diagnosis Date Noted  . History of adenomatous polyp of colon 09/23/2016    Priority: Medium  . Migraines 05/06/2016    Priority: Medium  . HTN (hypertension) 11/21/2011    Priority: Medium  . Depression 09/06/2006    Priority: Medium  . Family history of cancer of GI tract 12/06/2012    Priority: Low  . MENORRHAGIA 09/23/2009    Priority: Low  . PLANTAR FASCIITIS 09/20/2007    Priority: Low  . Osteoarthritis 09/19/2006    Priority: Low  . Allergic rhinitis 09/06/2006    Priority: Low  . GERD 09/06/2006    Priority: Low  . Ingrown toenail 12/20/2016   Past Surgical History:  Procedure Laterality Date  . TUBAL LIGATION      Family History  Problem Relation Age of Onset  . Diabetes Father        mother  . Hyperlipidemia Father        mother  . Hypertension Father        mother  . Colon cancer Father        42s, and grandmother  . Lung cancer Father        in 61s  . Cancer - Other Sister        duodenal  . Stomach cancer Sister   . Diabetes Mother   . Hypertension Mother   . Hyperlipidemia Mother   . Kidney disease Mother        has fistula- could need dialysis at any time  . Colon cancer Paternal Grandmother   . Colon cancer Maternal Aunt   . Esophageal cancer Neg Hx   . Liver cancer Neg Hx   . Pancreatic cancer Neg Hx   .  Rectal cancer Neg Hx     Medications- reviewed and updated Current Outpatient Medications  Medication Sig Dispense Refill  . amLODipine (NORVASC) 2.5 MG tablet Take 1 tablet (2.5 mg total) by mouth daily. 91 tablet 0  . doxycycline (VIBRA-TABS) 100 MG tablet Take 1 tablet (100 mg total) by mouth 2 (two) times daily for 7 days. 14 tablet 0  . hydrochlorothiazide (HYDRODIURIL) 25 MG tablet TAKE 1 TABLET BY MOUTH EVERY DAY 30 tablet 2  . meloxicam (MOBIC) 7.5 MG tablet Take 1 tablet (7.5 mg total) by mouth daily as needed for pain. 90 tablet 0  . pantoprazole (PROTONIX) 40 MG tablet TAKE 1 TABLET (40 MG TOTAL) BY MOUTH DAILY. (Patient not taking: Reported on 11/02/2016) 90 tablet 1  . PARoxetine (PAXIL) 10 MG tablet Take 1 tablet (10 mg total) by mouth daily. 90 tablet 1   Current Facility-Administered Medications  Medication Dose Route Frequency Provider Last Rate Last Dose  . 0.9 %  sodium chloride infusion  500 mL Intravenous Continuous Irene Shipper, MD        Allergies-reviewed and updated Allergies  Allergen Reactions  . Amoxicillin Swelling    Lip swelling    Social History   Social History Narrative   Married May 13th 2017. 2 children- son 2 (married) and daughter 6. Granddaughter august 2017.       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 Oropharynx normal Neck: no thyromegaly CV: RRR no murmurs rubs or gallops Lungs: CTAB no crackles, wheeze, rhonchi Abdomen: soft/nontender/nondistended/normal bowel sounds. No rebound or guarding.  Ext: no edema Skin: warm, dry Neuro: grossly normal, moves all extremities,  PERRLA  Assessment/Plan:  47 y.o. female presenting for annual physical.  Health Maintenance counseling: 1. Anticipatory guidance: Patient counseled regarding regular dental exams -q6 months, eye exams - yearly, wearing seatbelts.  2. Risk factor reduction:  Advised patient of need for regular exercise and diet rich and fruits and vegetables to reduce risk of heart attack and stroke. Exercise- stressors have gotten in the way- encouraged her to make time for herself to exercise- goal 150 minutes a week. Diet-weight down 1 lb over last year- we discussed once again importance of weight loss for blood pressure and to help with joint pains. She know she needs to cut down on breads and pastas. Gets indigestion with salads. Doesn't like a lot of veggies other than broccoli. Set  Goal of 5-10 lbs off by next visit Wt Readings from Last 3 Encounters:  07/12/17 254 lb 9.6 oz (115.5 kg)  09/19/16 260 lb (117.9 kg)  09/16/16 256 lb (116.1 kg)  3. Immunizations/screenings/ancillary studies Immunization History  Administered Date(s) Administered  . Influenza Split 11/21/2011  . Influenza Whole 11/19/2007, 12/03/2008  . Influenza,inj,Quad PF,6+ Mos 11/21/2012, 12/24/2015  . Influenza-Unspecified 12/17/2014  . Td 02/21/2001  . Tdap 11/21/2011  4. Cervical cancer screening- 01/13/2016 with 3-year repeat with Dr. Talbert Nan.  Had ASCUS but was HPV negative 5. Breast cancer screening-  breast exam with gynecology and mammogram -she states had mammogram at [facility]- will try to get records 6. Colon cancer screening - adenoma found 10/10/2016 with 5-year repeat advised 7. Birth control/STD check- tubal ligation. No STD concern- monogamous   Status of chronic or acute concerns   Bacterial sinusitis S:Recently took amoxicillin for a day or two through [facility] for symptoms over 2-3 weeks including - Fatigue, congestion, ear pain, runny nose, sinus pressure, sneezing, voice change, eye pain, headaches. They thought  was sinus infection. Had lip swelling on amoxcillin unfortunately- felt better other than lip swelling. Last dose was on Friday- she has some lingering symptoms and worsening last night. She had tried allergy medicine and sinus tablet and that didn't help Medical history- nonsmoker ROS- no fever, chills, nausea, vomiting A/P: Given improvement with amoxicillin- suggests bacterial element- will treat with doxycycline for 7 days.   Depression S: PHQ 9 of 4 today.  Last year she was on Paxil 10 mg but was only taking as needed-I encouraged her to take it daily. Stressors have increased with dad with lung cancer and mom may go on dialysis Depression screen [facility] 2/9 07/12/2017  Decreased Interest 2  Down, Depressed, Hopeless 0  PHQ - 2 Score 2  Altered sleeping 1  Tired, decreased energy 1  Change in appetite 0  Feeling bad or failure about yourself  0  Trouble concentrating 0  Moving slowly or fidgety/restless 0  Suicidal thoughts 0  PHQ-9 Score 4  Difficult doing work/chores Somewhat difficult  A/P: we will add paxil 10mg  back in with stressors and the "somewhat difficult rating"  HTN (hypertension) Hypertension is controlled on amlodipine 2.5 mg and HCTZ 25 mg  Osteoarthritis Osteoarthritis-on Mobic for osteoarthritis of the lumbar spine, bilateral knees and shoulders.  We discussed the importance of weight loss to take stress of these joints. We discussed q6 month visits to check on kidneys while on mobic especiall with family history of kidney disease in mother  Migraines migraines-continues to use sparing Fioricet.  Most of the time her headaches are controlled with [employer] migraine tablet along with allergy medicine.  Seems to get more headaches when gets sinus irritation.  Return in about 6 months (around 01/12/2018) for follow up- or sooner if needed.  Lab/Order associations: Preventative health care - Plan: Lipid panel, Comprehensive metabolic panel, CBC with  Differential/Platelet  Essential hypertension - Plan: Lipid panel, Comprehensive metabolic panel, CBC with Differential/Platelet  Meds ordered this encounter  Medications  . PARoxetine (PAXIL) 10 MG tablet    Sig: Take 1 tablet (10 mg total) by mouth daily.    Dispense:  90 tablet    Refill:  1  . doxycycline (VIBRA-TABS) 100 MG tablet    Sig: Take 1 tablet (100 mg total) by mouth 2 (two) times daily for 7 days.    Dispense:  14 tablet    Refill:  0   Return precautions advised.  Garret Reddish, MD

## 2017-07-13 ENCOUNTER — Telehealth: Payer: Self-pay | Admitting: Family Medicine

## 2017-07-13 ENCOUNTER — Other Ambulatory Visit: Payer: Self-pay

## 2017-07-13 DIAGNOSIS — R7989 Other specified abnormal findings of blood chemistry: Secondary | ICD-10-CM

## 2017-07-13 DIAGNOSIS — R945 Abnormal results of liver function studies: Principal | ICD-10-CM

## 2017-07-13 NOTE — Telephone Encounter (Signed)
Called and spoke to patient to see what her concerns were. She stated "Due to my liver test she read on her OTC Migraine relief bottle ([employer] brand) that it has acetaminophen in it and can cause liver damage." She want's to know if she should stop taking the relief medication. She said she isn't a drinker so its bothering her about the LFT results. Also if she stops taking the medication, will she be referred to the ENT about her migraines/headache?

## 2017-07-13 NOTE — Telephone Encounter (Signed)
Tylenol at doses under 3000 mg/day are not generally harmful to the liver.  The most likely reason for elevated liver function test is fatty liver-this is incredibly common in both women and men.  She needs to focus on healthy eating, regular exercise and weight loss.  I am glad she has a follow-up lab test scheduled already

## 2017-07-13 NOTE — Telephone Encounter (Signed)
Copied from [place] [id number]. Topic: General - Other >> Jul 13, 2017  3:00 PM Marin Olp L wrote: Reason for CRM: Patient would like a call back from [facility] today about her lab results. She spoke with her earliest to review initially.

## 2017-07-14 NOTE — Telephone Encounter (Signed)
Called patient and it went straight to her voicemail. I left a message to call our office back. I didn't want to leave a message due to the amount of information I need to give her.

## 2017-07-14 NOTE — Telephone Encounter (Signed)
Called patient and gave her Dr. Ansel Bong response to her questions. Patient verbalized understanding.

## 2017-08-25 ENCOUNTER — Other Ambulatory Visit: Payer: BLUE CROSS/BLUE SHIELD

## 2017-09-01 ENCOUNTER — Other Ambulatory Visit (INDEPENDENT_AMBULATORY_CARE_PROVIDER_SITE_OTHER): Payer: BLUE CROSS/BLUE SHIELD

## 2017-09-01 DIAGNOSIS — R7989 Other specified abnormal findings of blood chemistry: Secondary | ICD-10-CM

## 2017-09-01 DIAGNOSIS — R945 Abnormal results of liver function studies: Secondary | ICD-10-CM | POA: Diagnosis not present

## 2017-09-01 LAB — HEPATIC FUNCTION PANEL
ALT: 36 U/L — ABNORMAL HIGH (ref 0–35)
AST: 36 U/L (ref 0–37)
Albumin: 4.1 g/dL (ref 3.5–5.2)
Alkaline Phosphatase: 62 U/L (ref 39–117)
Bilirubin, Direct: 0.1 mg/dL (ref 0.0–0.3)
Total Bilirubin: 0.7 mg/dL (ref 0.2–1.2)
Total Protein: 6.5 g/dL (ref 6.0–8.3)

## 2017-09-02 ENCOUNTER — Other Ambulatory Visit: Payer: Self-pay | Admitting: Family Medicine

## 2017-09-02 DIAGNOSIS — I10 Essential (primary) hypertension: Secondary | ICD-10-CM

## 2017-09-06 ENCOUNTER — Other Ambulatory Visit: Payer: Self-pay | Admitting: Family Medicine

## 2017-09-08 ENCOUNTER — Other Ambulatory Visit: Payer: Self-pay | Admitting: Internal Medicine

## 2017-09-08 DIAGNOSIS — K219 Gastro-esophageal reflux disease without esophagitis: Secondary | ICD-10-CM

## 2017-09-18 ENCOUNTER — Ambulatory Visit (INDEPENDENT_AMBULATORY_CARE_PROVIDER_SITE_OTHER): Payer: BLUE CROSS/BLUE SHIELD | Admitting: Podiatry

## 2017-09-18 DIAGNOSIS — L603 Nail dystrophy: Secondary | ICD-10-CM | POA: Diagnosis not present

## 2017-09-18 DIAGNOSIS — B351 Tinea unguium: Secondary | ICD-10-CM | POA: Diagnosis not present

## 2017-09-19 DIAGNOSIS — L608 Other nail disorders: Secondary | ICD-10-CM | POA: Diagnosis not present

## 2017-09-19 DIAGNOSIS — L603 Nail dystrophy: Secondary | ICD-10-CM | POA: Diagnosis not present

## 2017-09-20 NOTE — Progress Notes (Signed)
Subjective: 47 year old female presents the office today for concerns of toenail issues.  She said that her nails are somewhat discolored and growing and weird shape she is having no pain.  She previously underwent partial nail avulsion to the big toes last year and has done well however she is concerned that looks the toes.  She denies any signs or symptoms of infection. Denies any systemic complaints such as fevers, chills, nausea, vomiting. No acute changes since last appointment, and no other complaints at this time.   Objective: AAO x3, NAD DP/PT pulses palpable bilaterally, CRT less than 3 seconds She presents a partial nail avulsions performed bilateral hallux which are well-healed.  The nails himself are dystrophic with yellow toe dark discoloration but there is no pain is no edema, erythema.  In general all of her toenails have the same appearance this is not specific to the hallux toenails. No open lesions or pre-ulcerative lesions.  No pain with calf compression, swelling, warmth, erythema  Assessment: Onychodystrophy  Plan: -All treatment options discussed with the patient including all alternatives, risks, complications.  -Her symptoms are likely resultant but with onychodystrophy and damage to the nails.  I did debride the nail distance for culture/pathology today for damage versus onychomycosis.  The results of the culture we can discuss treatment options and she agrees this plan she has no further questions or concerns today.  Specimen was given to [place] -Patient encouraged to call the office with any questions, concerns, change in symptoms.   Trula Slade DPM

## 2017-09-21 ENCOUNTER — Other Ambulatory Visit: Payer: Self-pay | Admitting: Family Medicine

## 2017-09-22 ENCOUNTER — Telehealth: Payer: Self-pay | Admitting: Podiatry

## 2017-09-22 NOTE — Telephone Encounter (Signed)
This is [place] with [employer]. I'm calling to see what tests we need to perform on the nail specimen we received on Tonya Suarez. If you will give me a call back at [phone] it would be greatly appreciated. Her reference number is [id number]. Thank you.

## 2017-09-27 NOTE — Telephone Encounter (Signed)
Left a message for Tonya Suarez at [facility] today and stated that I was calling in reference to the patient stated above. Lattie Haw

## 2017-10-03 ENCOUNTER — Telehealth: Payer: Self-pay

## 2017-10-03 DIAGNOSIS — J019 Acute sinusitis, unspecified: Secondary | ICD-10-CM | POA: Diagnosis not present

## 2017-10-03 NOTE — Telephone Encounter (Signed)
-----   Message from Trula Slade, DPM sent at 10/03/2017  7:34 AM EDT ----- Janett Billow- can you call her and let her know that I would like to take another look at the toenails. The culture did show a type of fungus and should discuss treatment. Thanks.

## 2017-10-03 NOTE — Telephone Encounter (Signed)
Left voicemail for patient to return my phone call or call to set up an appointment to see Dr. Jacqualyn Posey to discuss no culture results and treatments.

## 2017-10-04 NOTE — Telephone Encounter (Signed)
Called again today and I did see where the results were in the [place] office. Lattie Haw

## 2017-10-10 ENCOUNTER — Ambulatory Visit: Payer: BLUE CROSS/BLUE SHIELD | Admitting: Podiatry

## 2017-10-20 ENCOUNTER — Ambulatory Visit: Payer: BLUE CROSS/BLUE SHIELD | Admitting: Podiatry

## 2017-10-20 ENCOUNTER — Encounter: Payer: Self-pay | Admitting: Podiatry

## 2017-10-20 DIAGNOSIS — B351 Tinea unguium: Secondary | ICD-10-CM

## 2017-10-20 NOTE — Progress Notes (Signed)
Subjective: 47 year old female presents the office today for follow-up evaluation of nail culture results.  She states her nails are unchanged in regards to the color of the thickening and she has no pain in the nails and she denies any redness or drainage.  She has no problems with ingrown toenail. Denies any systemic complaints such as fevers, chills, nausea, vomiting. No acute changes since last appointment, and no other complaints at this time.   Objective: AAO x3, NAD DP/PT pulses palpable bilaterally, CRT less than 3 seconds Nails are hypertrophic, dystrophic with yellow-brown discoloration.  The area from the previous partial nail avulsions are well-healed.  No pain in the nails and there is no swelling redness or drainage or any signs of infection. No open lesions or pre-ulcerative lesions.  No pain with calf compression, swelling, warmth, erythema  Assessment: Onychomycosis  Plan: -All treatment options discussed with the patient including all alternatives, risks, complications.  -I reviewed the culture, pathology results with her.  After discussion she has questionable liver enzyme elevation so it is more of a oral medication.  I prescribed topical antifungal today through [employer].  Short-term use, application instructions as well as success rates and side effects. -Patient encouraged to call the office with any questions, concerns, change in symptoms.   Tonya Suarez DPM

## 2017-11-14 ENCOUNTER — Ambulatory Visit: Payer: BLUE CROSS/BLUE SHIELD | Admitting: Family Medicine

## 2017-11-14 DIAGNOSIS — Z0289 Encounter for other administrative examinations: Secondary | ICD-10-CM

## 2017-11-17 ENCOUNTER — Encounter: Payer: Self-pay | Admitting: Family Medicine

## 2017-11-28 DIAGNOSIS — Z23 Encounter for immunization: Secondary | ICD-10-CM | POA: Diagnosis not present

## 2018-01-12 ENCOUNTER — Ambulatory Visit: Payer: BLUE CROSS/BLUE SHIELD | Admitting: Family Medicine

## 2018-01-12 DIAGNOSIS — Z0289 Encounter for other administrative examinations: Secondary | ICD-10-CM

## 2018-01-16 ENCOUNTER — Encounter: Payer: Self-pay | Admitting: Family Medicine

## 2018-03-18 ENCOUNTER — Other Ambulatory Visit: Payer: Self-pay | Admitting: Family Medicine

## 2018-03-27 DIAGNOSIS — M25569 Pain in unspecified knee: Secondary | ICD-10-CM | POA: Diagnosis not present

## 2018-03-29 ENCOUNTER — Ambulatory Visit: Payer: BLUE CROSS/BLUE SHIELD | Admitting: Family Medicine

## 2018-03-29 DIAGNOSIS — M25561 Pain in right knee: Secondary | ICD-10-CM | POA: Diagnosis not present

## 2018-03-29 DIAGNOSIS — M545 Low back pain: Secondary | ICD-10-CM | POA: Diagnosis not present

## 2018-04-10 ENCOUNTER — Encounter: Payer: Self-pay | Admitting: Family Medicine

## 2018-04-10 ENCOUNTER — Ambulatory Visit: Payer: BLUE CROSS/BLUE SHIELD | Admitting: Family Medicine

## 2018-04-10 VITALS — BP 120/80 | HR 80 | Temp 98.3°F | Ht 70.0 in | Wt 259.2 lb

## 2018-04-10 DIAGNOSIS — R202 Paresthesia of skin: Secondary | ICD-10-CM

## 2018-04-10 DIAGNOSIS — F3342 Major depressive disorder, recurrent, in full remission: Secondary | ICD-10-CM

## 2018-04-10 DIAGNOSIS — M79662 Pain in left lower leg: Secondary | ICD-10-CM | POA: Diagnosis not present

## 2018-04-10 DIAGNOSIS — I10 Essential (primary) hypertension: Secondary | ICD-10-CM

## 2018-04-10 DIAGNOSIS — M79661 Pain in right lower leg: Secondary | ICD-10-CM

## 2018-04-10 DIAGNOSIS — G2581 Restless legs syndrome: Secondary | ICD-10-CM

## 2018-04-10 LAB — COMPREHENSIVE METABOLIC PANEL
ALT: 25 U/L (ref 0–35)
AST: 28 U/L (ref 0–37)
Albumin: 4 g/dL (ref 3.5–5.2)
Alkaline Phosphatase: 69 U/L (ref 39–117)
BUN: 12 mg/dL (ref 6–23)
CO2: 27 mEq/L (ref 19–32)
Calcium: 9.2 mg/dL (ref 8.4–10.5)
Chloride: 103 mEq/L (ref 96–112)
Creatinine, Ser: 0.89 mg/dL (ref 0.40–1.20)
GFR: 81.36 mL/min (ref >60.00)
Glucose, Bld: 121 mg/dL — ABNORMAL HIGH (ref 70–99)
Potassium: 3.8 mEq/L (ref 3.5–5.1)
Sodium: 138 mEq/L (ref 135–145)
Total Bilirubin: 0.6 mg/dL (ref 0.2–1.2)
Total Protein: 6.6 g/dL (ref 6.0–8.3)

## 2018-04-10 LAB — CBC WITH DIFFERENTIAL/PLATELET
Basophils Absolute: 0.1 10*3/uL (ref 0.0–0.1)
Basophils Relative: 0.6 % (ref 0.0–3.0)
Eosinophils Absolute: 0.3 10*3/uL (ref 0.0–0.7)
Eosinophils Relative: 3 % (ref 0.0–5.0)
HCT: 42 % (ref 36.0–46.0)
Hemoglobin: 14.8 g/dL (ref 12.0–15.0)
Lymphocytes Relative: 36.9 % (ref 12.0–46.0)
Lymphs Abs: 3.9 10*3/uL (ref 0.7–4.0)
MCHC: 35.3 g/dL (ref 30.0–36.0)
MCV: 83.8 fl (ref 78.0–100.0)
Monocytes Absolute: 0.3 10*3/uL (ref 0.1–1.0)
Monocytes Relative: 3.3 % (ref 3.0–12.0)
Neutro Abs: 6 10*3/uL (ref 1.4–7.7)
Neutrophils Relative %: 56.2 % (ref 43.0–77.0)
Platelets: 221 10*3/uL (ref 150.0–400.0)
RBC: 5.01 Mil/uL (ref 3.87–5.11)
RDW: 15.3 % (ref 11.5–15.5)
WBC: 10.7 10*3/uL — ABNORMAL HIGH (ref 4.0–10.5)

## 2018-04-10 LAB — TSH: TSH: 1.53 u[IU]/mL (ref 0.35–4.50)

## 2018-04-10 LAB — IBC + FERRITIN
Ferritin: 16.6 ng/mL (ref 10.0–291.0)
Iron: 66 ug/dL (ref 42–145)
Saturation Ratios: 16.3 % — ABNORMAL LOW (ref 20.0–50.0)
Transferrin: 290 mg/dL (ref 212.0–360.0)

## 2018-04-10 LAB — VITAMIN B12: Vitamin B-12: 196 pg/mL — ABNORMAL LOW (ref 211–911)

## 2018-04-10 NOTE — Progress Notes (Signed)
Phone [phone]   Subjective:  Tonya Suarez is a 48 y.o. year old very pleasant female patient who presents for/with See problem oriented charting ROS-  No chest pain or shortness of breath. No  blurry vision.  Complains of pain in the lower legs-worse at night-seems to be better with movement.  No fecal or urinary incontinence reported  Past Medical History-  Patient Active Problem List   Diagnosis Date Noted  . History of adenomatous polyp of colon 09/23/2016    Priority: Medium  . Migraines 05/06/2016    Priority: Medium  . HTN (hypertension) 11/21/2011    Priority: Medium  . Depression 09/06/2006    Priority: Medium  . Family history of cancer of GI tract 12/06/2012    Priority: Low  . MENORRHAGIA 09/23/2009    Priority: Low  . PLANTAR FASCIITIS 09/20/2007    Priority: Low  . Osteoarthritis 09/19/2006    Priority: Low  . Allergic rhinitis 09/06/2006    Priority: Low  . GERD 09/06/2006    Priority: Low  . Ingrown toenail 12/20/2016    Medications- reviewed and updated Current Outpatient Medications  Medication Sig Dispense Refill  . amLODipine (NORVASC) 2.5 MG tablet Take 1 tablet (2.5 mg total) by mouth daily. 90 tablet 1  . hydrochlorothiazide (HYDRODIURIL) 25 MG tablet TAKE 1 TABLET BY MOUTH EVERY DAY 90 tablet 1  . meloxicam (MOBIC) 7.5 MG tablet TAKE 1 TABLET (7.5 MG TOTAL) BY MOUTH DAILY AS NEEDED FOR PAIN. 90 tablet 1  . NON FORMULARY [facility]  Onychomycosis Nail Lacquer -  Fluconazole 2%, Terbinafine 1% DMSO/undecylenic acid 25% Apply to affected nail once daily Qty. 120 gm 3 refills    . omeprazole (PRILOSEC) 40 MG capsule TAKE 1 CAPSULE BY MOUTH EVERY DAY 90 capsule 1  . PARoxetine (PAXIL) 10 MG tablet Take 1 tablet (10 mg total) by mouth daily. 90 tablet 1   No current facility-administered medications for this visit.      Objective:  BP 120/80 (BP Location: Left Wrist, Patient Position: Sitting, Cuff Size: Normal)   Pulse 80    Temp 98.3 F (36.8 C) (Oral)   Ht 5\' 10"  (1.778 m)   Wt 259 lb 3.2 oz (117.6 kg)   SpO2 98%   BMI 37.19 kg/m  Gen: NAD, resting comfortably CV: RRR no murmurs rubs or gallops Lungs: CTAB no crackles, wheeze, rhonchi Abdomen: soft/nontender/nondistended Ext: no edema, varicose veins in bilateral calves worse on the left leg-but patient reports pain is worse on the right Skin: warm, dry     Assessment and Plan   #Depression S: Patient compliant with Paxil 10 mg. A/P: PHQ9 reasonable at a 4 today- stable and in remission- continue current medicine   Pain in bilateral lower legs with paresthesias on right side S: Patient presents with concern about her leg circulation  Patient states started about a year ago. Pain was from knee down to ankle area when she laid down at night- occasionally would get a coolness sensation as well as tingling in toes particularly on the right. Went to [employer] and when came home actually resolved for about a month.   Symptoms restarted in November. Same pattern as previous. Pain still worse at night - some pain in the day. Sleeps with pillow between her legs. Feels like if moves the legs or gets up or rubs the legs it seems to help. Pain minimal in day perhaps 3/10 max. At night can get up to 10/10- once again  feels better when moves legs.  No clear claudication  Takes meloxicam every night-  Takes ofr knees, shoulders, but primarily back. Has not had period in a year- in relation to potential restless legs.  A/P: Unclear etiology-could originate from the back-she is working with orthopedics at present. -Excellent pulses today- doubt arterial -Does have venous insufficiency with varicose veins-recommended compression stockings - Check B12, TSH, CBC, CMP today -Long-term PPI use places her at risk for low B12 - Does seem to have a restless leg component-get ferritin level with target above 75.  Did have a history of heavy periods in the past.  HTN  (hypertension) S: Compliant with amlodipine 2.5 mg and hydrochlorothiazide 25 mg A/P: Stable. Continue current medications. Does ok weven without meloxicam   Lab/Order associations: Restless legs - Plan: IBC + Ferritin  Essential hypertension - Plan: CBC with Differential/Platelet, Comprehensive metabolic panel  Paresthesias - Plan: CBC with Differential/Platelet, Comprehensive metabolic panel, TSH, Vitamin B12   Return precautions advised.  Garret Reddish, MD

## 2018-04-10 NOTE — Patient Instructions (Addendum)
It would be worth trialing compression stockings up to the knee with 15 to 20 mmHg-you can find these on [place].  If they are helpful but do not provide full relief-could advance to 20 to 30 mmHg  Excellent pulses today so I do not think this is an arterial issue.  Could be related to the veins though.  Could be neuropathy-this could come from the back.  If no clear cause on labs and do not make improvement with the compression stockings-would recommend follow-up with orthopedics again   Please stop by lab before you go If you do not have mychart- we will call you about results within 5 business days of [place] receiving them.  If you have mychart- we will send your results within 3 business days of [place] receiving them.  If abnormal or we want to clarify a result, we will call or mychart you to make sure you receive the message.  If you have questions or concerns or don't hear within 5-7 days, please send [place] a message or call us.

## 2018-04-10 NOTE — Assessment & Plan Note (Signed)
S: Compliant with amlodipine 2.5 mg and hydrochlorothiazide 25 mg A/P: Stable. Continue current medications. Does ok weven without meloxicam

## 2018-04-11 ENCOUNTER — Telehealth: Payer: Self-pay | Admitting: Family Medicine

## 2018-04-11 NOTE — Telephone Encounter (Signed)
Copied from [place] [id number]. Topic: Quick Communication - Lab Results (Clinic Use ONLY) >> Apr 11, 2018  2:36 PM Franco Collet, CMA wrote: Called patient to inform them of  lab results. When patient returns call, triage nurse may disclose results.

## 2018-04-13 ENCOUNTER — Ambulatory Visit (INDEPENDENT_AMBULATORY_CARE_PROVIDER_SITE_OTHER): Payer: BLUE CROSS/BLUE SHIELD | Admitting: *Deleted

## 2018-04-13 DIAGNOSIS — E538 Deficiency of other specified B group vitamins: Secondary | ICD-10-CM

## 2018-04-13 MED ORDER — CYANOCOBALAMIN 1000 MCG/ML IJ SOLN
1000.0000 ug | Freq: Once | INTRAMUSCULAR | Status: AC
  Administered 2018-04-13: 1000 ug via INTRAMUSCULAR

## 2018-04-13 NOTE — Progress Notes (Signed)
Per orders of Dr.Hunter injection of Cyanocobalamin 1000 mcg/ml given in lt deltoid by Juriel Cid L Daveion Robar. CMA Patient tolerated injection well.  Pt told to return in 1 week.  Pt verbalized understanding.

## 2018-04-19 NOTE — Progress Notes (Deleted)
Per orders of Dr. Yong Channel, injection of *** given by Francella Solian in {Left/right:33004} deltoid. Patient tolerated injection well. Patient will make appointment for {NUMBER 1-10:22536}{Time; day/week/month:13537}

## 2018-04-20 ENCOUNTER — Ambulatory Visit: Payer: BLUE CROSS/BLUE SHIELD

## 2018-04-20 ENCOUNTER — Ambulatory Visit (INDEPENDENT_AMBULATORY_CARE_PROVIDER_SITE_OTHER): Payer: BLUE CROSS/BLUE SHIELD

## 2018-04-20 DIAGNOSIS — E538 Deficiency of other specified B group vitamins: Secondary | ICD-10-CM

## 2018-04-20 MED ORDER — CYANOCOBALAMIN 1000 MCG/ML IJ SOLN
1000.0000 ug | Freq: Once | INTRAMUSCULAR | Status: AC
  Administered 2018-04-20: 1000 ug via INTRAMUSCULAR

## 2018-04-20 NOTE — Progress Notes (Signed)
Per orders of Dr.Hunter injection of Cyanocobalamin 1000 mcg/ml given in right deltoid by Gwenyth Ober CMA Patient tolerated injection well.  Pt told to return in 1 week.  Pt verbalized understanding.  Dr. Jerline Pain can you please sign in the absence of Dr. Yong Channel.

## 2018-04-20 NOTE — Patient Instructions (Signed)
There are no preventive care reminders to display for this patient.  Depression screen [facility] 2/9 04/10/2018 07/12/2017  Decreased Interest 0 2  Down, Depressed, Hopeless 0 0  PHQ - 2 Score 0 2  Altered sleeping 3 1  Tired, decreased energy 1 1  Change in appetite 0 0  Feeling bad or failure about yourself  0 0  Trouble concentrating 0 0  Moving slowly or fidgety/restless 0 0  Suicidal thoughts 0 0  PHQ-9 Score 4 4  Difficult doing work/chores Somewhat difficult Somewhat difficult

## 2018-04-26 ENCOUNTER — Ambulatory Visit (INDEPENDENT_AMBULATORY_CARE_PROVIDER_SITE_OTHER): Payer: BLUE CROSS/BLUE SHIELD

## 2018-04-26 DIAGNOSIS — E538 Deficiency of other specified B group vitamins: Secondary | ICD-10-CM | POA: Diagnosis not present

## 2018-04-26 MED ORDER — CYANOCOBALAMIN 1000 MCG/ML IJ SOLN
1000.0000 ug | Freq: Once | INTRAMUSCULAR | Status: AC
  Administered 2018-04-26: 1000 ug via INTRAMUSCULAR

## 2018-04-26 NOTE — Progress Notes (Signed)
Per orders of Dr. Yong Channel, injection of vitamin B12 1000 mcg given by Gertie Exon, CMA.  Patient tolerated injection well.  Patient will return in 1 week for another B12 injection.

## 2018-04-30 ENCOUNTER — Telehealth: Payer: Self-pay | Admitting: Family Medicine

## 2018-04-30 NOTE — Telephone Encounter (Signed)
See note

## 2018-04-30 NOTE — Telephone Encounter (Signed)
Copied from [place] [id number]. Topic: Quick Communication - See Telephone Encounter >> Apr 30, 2018  9:16 AM Bea Graff, NT wrote: CRM for notification. See Telephone encounter for: 04/30/18. Pt states that she had seen Dr. Yong Channel for her leg pain and she has been getting B12 injections to help. She states that she believes the pain is now worse as it is at night and during the day. She stated he mentioned she may need to follow up with a orthopedic dr if the pain did not get better. She would like to discuss with Dr. Ansel Bong nurse to see how she should proceed.

## 2018-04-30 NOTE — Telephone Encounter (Signed)
I thought she was already seeing orthopedics-does she need another referral?  Happy to refer her back

## 2018-04-30 NOTE — Telephone Encounter (Signed)
Please advise 

## 2018-05-01 NOTE — Telephone Encounter (Signed)
Spoke to pt told her Dr. Yong Channel thought you were already seeing Orthopedics? Pt said she did not go back for follow up due to other issues and thought she would have to see Dr. Yong Channel again for leg pain. Asked pt if she needs another referral or can you just call for an appt. Pt said she can just call for an appt. Told her okay if you do need a referral just let us know. Pt verbalized understanding.

## 2018-05-02 ENCOUNTER — Other Ambulatory Visit: Payer: Self-pay | Admitting: Family Medicine

## 2018-05-02 DIAGNOSIS — I10 Essential (primary) hypertension: Secondary | ICD-10-CM

## 2018-05-03 ENCOUNTER — Ambulatory Visit: Payer: BLUE CROSS/BLUE SHIELD

## 2018-05-03 DIAGNOSIS — M25562 Pain in left knee: Secondary | ICD-10-CM | POA: Diagnosis not present

## 2018-05-03 DIAGNOSIS — M25561 Pain in right knee: Secondary | ICD-10-CM | POA: Diagnosis not present

## 2018-05-09 ENCOUNTER — Other Ambulatory Visit: Payer: Self-pay

## 2018-05-09 ENCOUNTER — Ambulatory Visit (INDEPENDENT_AMBULATORY_CARE_PROVIDER_SITE_OTHER): Payer: BLUE CROSS/BLUE SHIELD

## 2018-05-09 DIAGNOSIS — E538 Deficiency of other specified B group vitamins: Secondary | ICD-10-CM | POA: Diagnosis not present

## 2018-05-09 MED ORDER — CYANOCOBALAMIN 1000 MCG/ML IJ SOLN: 1000.0000 ug | Freq: Once | INTRAMUSCULAR | Status: DC

## 2018-05-09 MED ORDER — CYANOCOBALAMIN 1000 MCG/ML IJ SOLN
1000.0000 ug | Freq: Once | INTRAMUSCULAR | Status: AC
  Administered 2018-05-09: 1000 ug via INTRAMUSCULAR

## 2018-05-09 NOTE — Patient Instructions (Signed)
There are no preventive care reminders to display for this patient.  Depression screen [facility] 2/9 04/10/2018 07/12/2017  Decreased Interest 0 2  Down, Depressed, Hopeless 0 0  PHQ - 2 Score 0 2  Altered sleeping 3 1  Tired, decreased energy 1 1  Change in appetite 0 0  Feeling bad or failure about yourself  0 0  Trouble concentrating 0 0  Moving slowly or fidgety/restless 0 0  Suicidal thoughts 0 0  PHQ-9 Score 4 4  Difficult doing work/chores Somewhat difficult Somewhat difficult

## 2018-05-09 NOTE — Progress Notes (Signed)
Per orders of Inda Coke, [place], injection of B12, given in Rt deltoid by Elnora Morrison, CMA.  Pt tolerated well.

## 2018-05-18 DIAGNOSIS — M25561 Pain in right knee: Secondary | ICD-10-CM | POA: Diagnosis not present

## 2018-05-18 DIAGNOSIS — M1711 Unilateral primary osteoarthritis, right knee: Secondary | ICD-10-CM | POA: Diagnosis not present

## 2018-05-18 DIAGNOSIS — M25562 Pain in left knee: Secondary | ICD-10-CM | POA: Diagnosis not present

## 2018-05-28 ENCOUNTER — Telehealth: Payer: Self-pay | Admitting: Internal Medicine

## 2018-05-28 DIAGNOSIS — K219 Gastro-esophageal reflux disease without esophagitis: Secondary | ICD-10-CM

## 2018-05-28 MED ORDER — OMEPRAZOLE 40 MG PO CPDR: DELAYED_RELEASE_CAPSULE | ORAL | 1 refills | Status: DC

## 2018-05-28 NOTE — Telephone Encounter (Signed)
Refills given.

## 2018-05-28 NOTE — Telephone Encounter (Signed)
Pt needs rf for Omeprazole sent to [facility] on [address].

## 2018-06-20 ENCOUNTER — Ambulatory Visit (INDEPENDENT_AMBULATORY_CARE_PROVIDER_SITE_OTHER): Payer: BLUE CROSS/BLUE SHIELD | Admitting: Family Medicine

## 2018-06-20 ENCOUNTER — Encounter: Payer: Self-pay | Admitting: Family Medicine

## 2018-06-20 VITALS — HR 85 | Temp 96.9°F | Ht 70.0 in | Wt 252.0 lb

## 2018-06-20 DIAGNOSIS — R1030 Lower abdominal pain, unspecified: Secondary | ICD-10-CM

## 2018-06-20 DIAGNOSIS — I10 Essential (primary) hypertension: Secondary | ICD-10-CM | POA: Diagnosis not present

## 2018-06-20 DIAGNOSIS — K5900 Constipation, unspecified: Secondary | ICD-10-CM | POA: Diagnosis not present

## 2018-06-20 NOTE — Patient Instructions (Addendum)
There are no preventive care reminders to display for this patient.  Video visit

## 2018-06-20 NOTE — Progress Notes (Signed)
Phone [phone]   Subjective:  Virtual visit via Video note. Chief complaint: Chief Complaint  Patient presents with  . Abdominal Pain    x2days   This visit type was conducted due to national recommendations for restrictions regarding the COVID-19 Pandemic (e.g. social distancing).  This format is felt to be most appropriate for this patient at this time balancing risks to patient and risks to population by having him in for in person visit.  No physical exam was performed (except for noted visual exam or audio findings with Telehealth visits).    Our team/I connected with Tonya Suarez on 06/20/18 at 10:40 AM EDT by a video enabled telemedicine application ([email or web address]) and verified that I am speaking with the correct person using two identifiers.  Location patient: [place] Location provider: [facility], office Persons participating in the virtual visit:  patient  Our team/I discussed the limitations of evaluation and management by telemedicine and the availability of in person appointments. In light of current covid-19 pandemic, patient also understands that we are trying to protect them by minimizing in office contact if at all possible.  The patient expressed consent for telemedicine visit and agreed to proceed. Patient understands insurance will be billed.   ROS-no nausea or vomiting reported.  No fever/chills/anosmia/cough reported  Past Medical History-  Patient Active Problem List   Diagnosis Date Noted  . History of adenomatous polyp of colon 09/23/2016    Priority: Medium  . Migraines 05/06/2016    Priority: Medium  . HTN (hypertension) 11/21/2011    Priority: Medium  . Depression 09/06/2006    Priority: Medium  . Family history of cancer of GI tract 12/06/2012    Priority: Low  . MENORRHAGIA 09/23/2009    Priority: Low  . PLANTAR FASCIITIS 09/20/2007    Priority: Low  . Osteoarthritis 09/19/2006    Priority: Low  . Allergic rhinitis 09/06/2006   Priority: Low  . GERD 09/06/2006    Priority: Low  . Ingrown toenail 12/20/2016    Medications- reviewed and updated Current Outpatient Medications  Medication Sig Dispense Refill  . amLODipine (NORVASC) 2.5 MG tablet TAKE 1 TABLET BY MOUTH EVERY DAY 90 tablet 1  . hydrochlorothiazide (HYDRODIURIL) 25 MG tablet TAKE 1 TABLET BY MOUTH EVERY DAY 90 tablet 1  . omeprazole (PRILOSEC) 40 MG capsule TAKE 1 CAPSULE BY MOUTH EVERY DAY 90 capsule 1  . PARoxetine (PAXIL) 10 MG tablet Take 1 tablet (10 mg total) by mouth daily. 90 tablet 1  . meloxicam (MOBIC) 7.5 MG tablet TAKE 1 TABLET (7.5 MG TOTAL) BY MOUTH DAILY AS NEEDED FOR PAIN. (Patient not taking: Reported on 06/20/2018) 90 tablet 1  . NON FORMULARY [facility]  Onychomycosis Nail Lacquer -  Fluconazole 2%, Terbinafine 1% DMSO/undecylenic acid 25% Apply to affected nail once daily Qty. 120 gm 3 refills     No current facility-administered medications for this visit.      Objective:  Pulse 85   Temp (!) 96.9 F (36.1 C)   Ht 5\' 10"  (1.778 m)   Wt 252 lb (114.3 kg)   LMP 06/05/2018   BMI 36.16 kg/m Self-reported vitals Gen: NAD, resting comfortably Lungs: nonlabored, normal respiratory rate  Skin: appears dry, no obvious rash Points across lower abdomen as source of pain- hurts somewhat to push on area     Assessment and Plan   #Constipation/lower abdominal pain S: patient states she has had some abdominal discomfort for a few days. Energy level is lower today  than usual. Feels like needs to have a bowel movement - then only has very small amount. Tends to be constipated. Took laxative last night- magnesium citrate but has not had a BM yet. Pain mainly in lower abdomen- can be moderate in intensity. When she has BM- seems to help the pain. Hurts more on the left hand side. Has noted hemorrhoids flared up - gets only small amount of blood.   No fever or chills.   A/P: With patient discomfort-considered diverticulitis-We  reviewed colonoscopy from 08/2016 and no diverticulosis was noted which makes diverticulitis less likely. Still passing gas.  Given improvement in her symptoms when she has a bowel movement and concerns about constipation-I do think constipation is more likely cause-she does not have a bowel movement today may add MiraLAX.  Still passing gas and stool at this point-doubt obstruction.  Wrote a work note for today but she knows to call back if she has new or worsening symptoms.  We discussed sometimes abdominal pain can be a presenting symptom for COVID-19 but she has no clear indication to remain out of work-we discussed possible symptom development and reasons to call us back -No obvious urinary symptoms to suggest UTI and given improvement with bowel movement-think that is less likely  #hypertension S: controlled on amlodipine 2.5 mg and hydrochlorothiazide 25 mg in the past- blood pressure at home mildly elevated diastolic at 94.  Systolic is well controlled at 120 BP Readings from Last 3 Encounters:  04/10/18 120/80  07/12/17 116/84  09/19/16 (!) 152/107  A/P: Mild poor control today-patient is in discomfort-she will continue to monitor at home and if does not go back down she should let us know.  Continue current medications-we will recheck at next visit next month  Future Appointments  Date Time Provider [place]  07/20/2018  2:20 PM Marin Olp, MD [facility]   Lab/Order associations: Lower abdominal pain  Constipation, unspecified constipation type  Essential hypertension  Return precautions advised.  Garret Reddish, MD

## 2018-06-21 ENCOUNTER — Encounter: Payer: Self-pay | Admitting: Family Medicine

## 2018-06-21 ENCOUNTER — Ambulatory Visit (INDEPENDENT_AMBULATORY_CARE_PROVIDER_SITE_OTHER): Payer: BLUE CROSS/BLUE SHIELD | Admitting: Family Medicine

## 2018-06-21 VITALS — BP 129/98 | HR 80 | Temp 97.9°F | Ht 70.0 in | Wt 252.0 lb

## 2018-06-21 DIAGNOSIS — K5900 Constipation, unspecified: Secondary | ICD-10-CM | POA: Diagnosis not present

## 2018-06-21 DIAGNOSIS — I10 Essential (primary) hypertension: Secondary | ICD-10-CM | POA: Diagnosis not present

## 2018-06-21 DIAGNOSIS — R1030 Lower abdominal pain, unspecified: Secondary | ICD-10-CM | POA: Diagnosis not present

## 2018-06-21 NOTE — Telephone Encounter (Signed)
This has been taking care of.

## 2018-06-21 NOTE — Progress Notes (Signed)
Phone [phone]   Subjective:  Virtual visit via Video note. Chief complaint: Chief Complaint  Patient presents with  . Abdominal Pain    dull pain in lower abdomen  . Emesis    This visit type was conducted due to national recommendations for restrictions regarding the COVID-19 Pandemic (e.g. social distancing).  This format is felt to be most appropriate for this patient at this time balancing risks to patient and risks to population by having him in for in person visit.  No physical exam was performed (except for noted visual exam or audio findings with Telehealth visits).    Our team/I connected with Tonya Suarez on 06/21/18 at  4:40 PM EDT by a video enabled telemedicine application ([email or web address]) and verified that I am speaking with the correct person using two identifiers.  Location patient: [place] Location provider: [facility], office Persons participating in the virtual visit:  patient  Our team/I discussed the limitations of evaluation and management by telemedicine and the availability of in person appointments. In light of current covid-19 pandemic, patient also understands that we are trying to protect them by minimizing in office contact if at all possible.  The patient expressed consent for telemedicine visit and agreed to proceed. Patient understands insurance will be billed.   ROS-no fever.  No cough or congestion.  No shortness of breath reported.  Does have lower abdominal pain that seems to come in waves.  Past Medical History-  Patient Active Problem List   Diagnosis Date Noted  . History of adenomatous polyp of colon 09/23/2016    Priority: Medium  . Migraines 05/06/2016    Priority: Medium  . HTN (hypertension) 11/21/2011    Priority: Medium  . Depression 09/06/2006    Priority: Medium  . Family history of cancer of GI tract 12/06/2012    Priority: Low  . MENORRHAGIA 09/23/2009    Priority: Low  . PLANTAR FASCIITIS 09/20/2007    Priority: Low   . Osteoarthritis 09/19/2006    Priority: Low  . Allergic rhinitis 09/06/2006    Priority: Low  . GERD 09/06/2006    Priority: Low  . Ingrown toenail 12/20/2016    Medications- reviewed and updated Current Outpatient Medications  Medication Sig Dispense Refill  . amLODipine (NORVASC) 2.5 MG tablet TAKE 1 TABLET BY MOUTH EVERY DAY 90 tablet 1  . hydrochlorothiazide (HYDRODIURIL) 25 MG tablet TAKE 1 TABLET BY MOUTH EVERY DAY 90 tablet 1  . meloxicam (MOBIC) 7.5 MG tablet TAKE 1 TABLET (7.5 MG TOTAL) BY MOUTH DAILY AS NEEDED FOR PAIN. 90 tablet 1  . NON FORMULARY [facility]  Onychomycosis Nail Lacquer -  Fluconazole 2%, Terbinafine 1% DMSO/undecylenic acid 25% Apply to affected nail once daily Qty. 120 gm 3 refills    . omeprazole (PRILOSEC) 40 MG capsule TAKE 1 CAPSULE BY MOUTH EVERY DAY 90 capsule 1  . PARoxetine (PAXIL) 10 MG tablet Take 1 tablet (10 mg total) by mouth daily. 90 tablet 1   No current facility-administered medications for this visit.      Objective:  BP (!) 129/98   Pulse 80   Temp 97.9 F (36.6 C) (Oral)   Ht 5\' 10"  (1.778 m)   Wt 252 lb (114.3 kg)   LMP 06/05/2018   BMI 36.16 kg/m  Gen: NAD, laying on her couch Lungs: nonlabored, normal respiratory rate  Skin: appears dry, no obvious rash     Assessment and Plan   #Lower abdominal pain/hypertension/constipation S:last night had a wave  of sharp cramps in LLQ- went to the bathroom- had 3 different BMs. During one of those she ended up feeling nauseous and throwing up. Today still feels nauseous. Pain in belly comes and goes- at worst up to 3-4/10. Feels like something is moving in the stomach. Had diarrhea but had taken magnesium citrate the night before- never took miralax. Overall pain has improved after BMs. Had a few chills just when having BM/pain.   Had menstrual cycle starting 2 weeks ago and then stopping 1.5 weeks ago. No blooed. No dysuria or polyuria. Did have slight vaginal discharge  after cycle but that has resolved.    Recent grilled cheese sandwich- otherwise not eating much but tryign to stay well hydrated  Blood pressure noted to be elevated today with diastolic in the 69I  A/P: Patient reached out because she had worsening abdominal pain last night-fortunately the bowel movements she had actually have improved her overall pain pattern.  This goes along with constipation potentially being the cause- we touched base today due to the vomiting she had last night and worsening pain-fortunately neither of these have resolved today.  Has some lingering nausea and lower level abdominal pain.  We discussed possible lab work and CT imaging-she prefers to give this some more time.  She agrees to update me next week with how she is doing from her abdomen and with blood pressures -No flank pain-do not strongly suspect kidney stones - Once again from last visit no history of diverticulosis on colonoscopy so doubt diverticulitis -Can use MiraLAX if needed for constipation -Work note written through Monday of next week to allow her time to rest.  Blood pressure noted to be elevated- has typically been well controlled in the office.  She is compliant with amlodipine 2.5 mg and hydrochlorothiazide 25 mg.  If blood pressure does not return normal with her feeling better- may need increased Rx  Future Appointments  Date Time Provider [place]  07/20/2018  2:20 PM Marin Olp, MD [facility]   Lab/Order associations: Lower abdominal pain  Constipation, unspecified constipation type  Essential hypertension  Return precautions advised.  Garret Reddish, MD

## 2018-06-21 NOTE — Patient Instructions (Addendum)
There are no preventive care reminders to display for this patient.  Depression screen [facility] 2/9 04/10/2018 07/12/2017  Decreased Interest 0 2  Down, Depressed, Hopeless 0 0  PHQ - 2 Score 0 2  Altered sleeping 3 1  Tired, decreased energy 1 1  Change in appetite 0 0  Feeling bad or failure about yourself  0 0  Trouble concentrating 0 0  Moving slowly or fidgety/restless 0 0  Suicidal thoughts 0 0  PHQ-9 Score 4 4  Difficult doing work/chores Somewhat difficult Somewhat difficult   Video visit

## 2018-06-21 NOTE — Telephone Encounter (Signed)
Pt has been scheduled for 4:40pm. Pt notified and verbalized understanding. Pt was also informed that we may do her visit sooner than 4:40pm.

## 2018-06-21 NOTE — Telephone Encounter (Signed)
Okay to write pt out of work for 06/22/2018. Pt had an appt 06/20/2018 sx still present.  Please advise

## 2018-07-19 ENCOUNTER — Other Ambulatory Visit: Payer: Self-pay

## 2018-07-19 ENCOUNTER — Ambulatory Visit (INDEPENDENT_AMBULATORY_CARE_PROVIDER_SITE_OTHER): Payer: BLUE CROSS/BLUE SHIELD | Admitting: Family Medicine

## 2018-07-19 ENCOUNTER — Encounter: Payer: Self-pay | Admitting: Family Medicine

## 2018-07-19 VITALS — BP 125/84 | HR 76 | Temp 97.9°F | Ht 68.0 in | Wt 249.6 lb

## 2018-07-19 DIAGNOSIS — K5900 Constipation, unspecified: Secondary | ICD-10-CM

## 2018-07-19 DIAGNOSIS — E538 Deficiency of other specified B group vitamins: Secondary | ICD-10-CM | POA: Diagnosis not present

## 2018-07-19 DIAGNOSIS — I10 Essential (primary) hypertension: Secondary | ICD-10-CM

## 2018-07-19 DIAGNOSIS — Z833 Family history of diabetes mellitus: Secondary | ICD-10-CM | POA: Diagnosis not present

## 2018-07-19 DIAGNOSIS — Z87891 Personal history of nicotine dependence: Secondary | ICD-10-CM | POA: Insufficient documentation

## 2018-07-19 DIAGNOSIS — Z0000 Encounter for general adult medical examination without abnormal findings: Secondary | ICD-10-CM | POA: Diagnosis not present

## 2018-07-19 DIAGNOSIS — K219 Gastro-esophageal reflux disease without esophagitis: Secondary | ICD-10-CM

## 2018-07-19 DIAGNOSIS — M159 Polyosteoarthritis, unspecified: Secondary | ICD-10-CM

## 2018-07-19 DIAGNOSIS — F17200 Nicotine dependence, unspecified, uncomplicated: Secondary | ICD-10-CM | POA: Diagnosis not present

## 2018-07-19 DIAGNOSIS — G2581 Restless legs syndrome: Secondary | ICD-10-CM

## 2018-07-19 DIAGNOSIS — F3342 Major depressive disorder, recurrent, in full remission: Secondary | ICD-10-CM

## 2018-07-19 DIAGNOSIS — R739 Hyperglycemia, unspecified: Secondary | ICD-10-CM | POA: Diagnosis not present

## 2018-07-19 DIAGNOSIS — G43909 Migraine, unspecified, not intractable, without status migrainosus: Secondary | ICD-10-CM

## 2018-07-19 DIAGNOSIS — M150 Primary generalized (osteo)arthritis: Secondary | ICD-10-CM

## 2018-07-19 DIAGNOSIS — M8949 Other hypertrophic osteoarthropathy, multiple sites: Secondary | ICD-10-CM

## 2018-07-19 LAB — POC URINALSYSI DIPSTICK (AUTOMATED)
Bilirubin, UA: NEGATIVE
Blood, UA: NEGATIVE
Glucose, UA: NEGATIVE
Ketones, UA: NEGATIVE
Leukocytes, UA: NEGATIVE
Nitrite, UA: NEGATIVE
Protein, UA: NEGATIVE
Spec Grav, UA: >=1.03 — AB (ref 1.010–1.025)
Urobilinogen, UA: 0.2 E.U./dL
pH, UA: 6 (ref 5.0–8.0)

## 2018-07-19 MED ORDER — CYCLOBENZAPRINE HCL 5 MG PO TABS: 5.0000 mg | ORAL_TABLET | Freq: Three times a day (TID) | ORAL | 1 refills | Status: DC | PRN

## 2018-07-19 NOTE — Assessment & Plan Note (Signed)
Resolved on iron supplements-ferritin haf been as low as 16 previously

## 2018-07-19 NOTE — Patient Instructions (Addendum)
Lets try miralax 1/2 capful everyday for the next 2 months. If you go more than a week without a BM on this- then can take full capful daily until BM then go back to 1/2 capful. Hoping this improves your consistency and helps [place] avoid those painful backup periods.   Please stop by lab before you go If you do not have mychart- we will call you about results within 5 business days of [place] receiving them.  If you have mychart- we will send your results within 3 business days of [place] receiving them.  If abnormal or we want to clarify a result, we will call or mychart you to make sure you receive the message.  If you have questions or concerns or don't hear within 5-7 days, please send [place] a message or call us.   Great job losing 5 lbs over last year- lets shoot for another 5-10 within a year or even 6 months  Would love to see you stop the black and milds  I love your idea of getting a massage more regularly so you can take some time for you- we need a healthier stress relief option like that for you

## 2018-07-19 NOTE — Assessment & Plan Note (Signed)
-   doing b12 otc right now- had injections previously Lab Results  Component Value Date   VITAMINB12 196 (L) 04/10/2018  -Update B12 today

## 2018-07-19 NOTE — Assessment & Plan Note (Signed)
#   started black and milds in last 6 months to a year perhaps 1-2 per day. Uses as stress reliever - thinks massage would be good alternate - she may message me about rx for massage- this would be good not only for stress but for back pain

## 2018-07-19 NOTE — Assessment & Plan Note (Signed)
#  Constipation/left lower quadrant pain  S:Sunday night got a bad pain in left lower abdomen. Felt like needed to have BM- then did and pain got better but unfortunately also threw up at the same time. Going on for 6 months about once a month. Usually loose- may take 2 or 3 times going to bathroom for it to resolve- once it all gets out she feels better.   Her normal pattern is 1-2x a week- can stretch up to 2 weeks. Pain seems to come with more stretched out period between BMs. Lactose free milk - if does milk or ice cream will get a BM and throw up.  A/P: Given complete resolution of symptoms-thought it was reasonable to trial treating constipation to see if this resolves the issues-if not could consider further evaluation such as CT abdomen pelvis From AVS "  Patient Instructions  Lets try miralax 1/2 capful everyday for the next 2 months. If you go more than a week without a BM on this- then can take full capful daily until BM then go back to 1/2 capful. Hoping this improves your consistency and helps [place] avoid those painful backup periods.   Please stop by lab before you go If you do not have mychart- we will call you about results within 5 business days of [place] receiving them.  If you have mychart- we will send your results within 3 business days of [place] receiving them.  If abnormal or we want to clarify a result, we will call or mychart you to make sure you receive the message.  If you have questions or concerns or don't hear within 5-7 days, please send [place] a message or call us.  "

## 2018-07-19 NOTE — Assessment & Plan Note (Signed)
#   OA- on mobic for lumbar spine, bilateral knees, and shoulders.  Generally finds Mobic helpful.  Got a shot in the right knee and its doing better.   #low back pain on left side- also radiating into the left thigh for about a week. On Saturday was limping some with this- seems to come and go and can get stiff. Still using meloxicam for OA as below. Has tried heating pad with some help.  -Acute flareup of chronic back pain- recommended heat-if needed at nighttime can also add muscle relaxant which was sent in today-Flexeril

## 2018-07-19 NOTE — Progress Notes (Signed)
Phone: [phone]   Subjective:  Patient presents today for their annual physical. Chief complaint-noted.   See problem oriented charting- ROS- full  review of systems was completed and negative except for: Migraines, low back pain, abdominal pain, constipation  The following were reviewed and entered/updated in epic: Past Medical History:  Diagnosis Date  . Allergy   . Arthritis   . Chronic headaches   . Depression   . GERD (gastroesophageal reflux disease)   . Hypertension   . Low back pain   . UTI (urinary tract infection)    Patient Active Problem List   Diagnosis Date Noted  . Current smoker 07/19/2018    Priority: High  . Constipation 07/19/2018    Priority: Medium  . History of adenomatous polyp of colon 09/23/2016    Priority: Medium  . Migraines 05/06/2016    Priority: Medium  . HTN (hypertension) 11/21/2011    Priority: Medium  . Depression 09/06/2006    Priority: Medium  . Ingrown toenail 12/20/2016    Priority: Low  . Family history of cancer of GI tract 12/06/2012    Priority: Low  . MENORRHAGIA 09/23/2009    Priority: Low  . PLANTAR FASCIITIS 09/20/2007    Priority: Low  . Osteoarthritis 09/19/2006    Priority: Low  . Allergic rhinitis 09/06/2006    Priority: Low  . GERD 09/06/2006    Priority: Low  . B12 deficiency 07/19/2018  . Restless legs 07/19/2018   Past Surgical History:  Procedure Laterality Date  . TUBAL LIGATION      Family History  Problem Relation Age of Onset  . Diabetes Father        mother  . Hyperlipidemia Father        mother  . Hypertension Father        mother  . Colon cancer Father        75s, and grandmother  . Lung cancer Father        in 34s  . Cancer - Other Sister        duodenal  . Stomach cancer Sister   . Diabetes Mother   . Hypertension Mother   . Hyperlipidemia Mother   . Kidney disease Mother        has fistula- could need dialysis at any time  . Colon cancer Paternal Grandmother   . Colon  cancer Maternal Aunt   . Esophageal cancer Neg Hx   . Liver cancer Neg Hx   . Pancreatic cancer Neg Hx   . Rectal cancer Neg Hx     Medications- reviewed and updated Current Outpatient Medications  Medication Sig Dispense Refill  . amLODipine (NORVASC) 2.5 MG tablet TAKE 1 TABLET BY MOUTH EVERY DAY 90 tablet 1  . hydrochlorothiazide (HYDRODIURIL) 25 MG tablet TAKE 1 TABLET BY MOUTH EVERY DAY 90 tablet 1  . meloxicam (MOBIC) 7.5 MG tablet TAKE 1 TABLET (7.5 MG TOTAL) BY MOUTH DAILY AS NEEDED FOR PAIN. 90 tablet 1  . omeprazole (PRILOSEC) 40 MG capsule TAKE 1 CAPSULE BY MOUTH EVERY DAY 90 capsule 1  . PARoxetine (PAXIL) 10 MG tablet Take 1 tablet (10 mg total) by mouth daily. 90 tablet 1  . cyclobenzaprine (FLEXERIL) 5 MG tablet Take 1 tablet (5 mg total) by mouth 3 (three) times daily as needed for muscle spasms. 30 tablet 1   No current facility-administered medications for this visit.     Allergies-reviewed and updated Allergies  Allergen Reactions  . Amoxicillin Swelling  Lip swelling    Social History   Social History Narrative   Married May 13th 2017. 2 children- son 96 (married) and daughter 80. Granddaughter august 2017.       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 Oropharynx normal Neck: no thyromegaly CV: RRR no murmurs rubs or gallops Lungs: CTAB no crackles, wheeze, rhonchi Abdomen: soft/nontender- other than mild pain with deep palpation in LLQ/nondistended/normal bowel sounds. No rebound or guarding. obeses Ext: no edema Skin: warm, dry Neuro: grossly normal, moves all extremities, PERRLA MSK: some muscle spasm left low back paraspinous muscles   Assessment and Plan    48 y.o. female presenting  for annual physical.  Health Maintenance counseling: 1. Anticipatory guidance: Patient counseled regarding regular dental exams -q6 months, eye exams - yearly,  avoiding smoking and second hand smoke- needs to quit black and milds , limiting alcohol to 1 beverage per day .   2. Risk factor reduction:  Advised patient of need for regular exercise and diet rich and fruits and vegetables to reduce risk of heart attack and stroke. Exercise- not exercising currently- encouraged to restart even if its at home. Diet-has tried to actually improve this with covid 19- choosing some healthier options and eating out less.  Weight down 5 lbs from last CPE! Wt Readings from Last 3 Encounters:  07/19/18 249 lb 9.6 oz (113.2 kg)  06/21/18 252 lb (114.3 kg)  06/20/18 252 lb (114.3 kg)  3. Immunizations/screenings/ancillary studies- consider shingrix next physical.  Immunization History  Administered Date(s) Administered  . Influenza Split 11/21/2011  . Influenza Whole 11/19/2007, 12/03/2008  . Influenza,inj,Quad PF,6+ Mos 11/21/2012, 12/24/2015  . Influenza-Unspecified 12/17/2014  . Td 02/21/2001  . Tdap 11/21/2011   4. Cervical cancer screening- 01/13/16 with 3 year repeat per DR. Jertson- ascus but HPV negative.  5. Breast cancer screening-  breast exam with GYN and mammogram - at [facility]- scheduled saturday 6. Colon cancer screening - adenoma 09/2016 with 5 year repeat  7. Skin cancer screening-  Lower risk due to skin tone. advised regular sunscreen use. Denies worrisome, changing, or new skin lesions.  8. Birth control/STD check-  tubal ligation. monogomous 9. Osteoporosis screening at 89- will plan on this 10. current smoker- willg et UA. Lung cancer screening -   Status of chronic or acute concerns    Current smoker # started black and milds in last 6 months to a year perhaps 1-2 per day. Uses as stress reliever - thinks massage would be good alternate - she may message me about rx for massage- this  would be good not only for stress but for back pain   Osteoarthritis # OA- on mobic for lumbar spine, bilateral knees, and shoulders.  Generally finds Mobic helpful.  Got a shot in the right knee and its doing better.   #low back pain on left side- also radiating into the left thigh for about a week. On Saturday was limping some with this- seems to come and go and can get stiff. Still using meloxicam for OA as below. Has tried heating pad with some help.  -Acute flareup of chronic back pain- recommended  heat-if needed at nighttime can also add muscle relaxant which was sent in today-Flexeril   Constipation #Constipation/left lower quadrant pain  S:Sunday night got a bad pain in left lower abdomen. Felt like needed to have BM- then did and pain got better but unfortunately also threw up at the same time. Going on for 6 months about once a month. Usually loose- may take 2 or 3 times going to bathroom for it to resolve- once it all gets out she feels better.   Her normal pattern is 1-2x a week- can stretch up to 2 weeks. Pain seems to come with more stretched out period between BMs. Lactose free milk - if does milk or ice cream will get a BM and throw up.  A/P: Given complete resolution of symptoms-thought it was reasonable to trial treating constipation to see if this resolves the issues-if not could consider further evaluation such as CT abdomen pelvis From AVS "  Patient Instructions  Lets try miralax 1/2 capful everyday for the next 2 months. If you go more than a week without a BM on this- then can take full capful daily until BM then go back to 1/2 capful. Hoping this improves your consistency and helps [place] avoid those painful backup periods.   Please stop by lab before you go If you do not have mychart- we will call you about results within 5 business days of [place] receiving them.  If you have mychart- we will send your results within 3 business days of [place] receiving them.  If abnormal or we  want to clarify a result, we will call or mychart you to make sure you receive the message.  If you have questions or concerns or don't hear within 5-7 days, please send [place] a message or call us.  "  B12 deficiency - doing b12 otc right now- had injections previously Lab Results  Component Value Date   VITAMINB12 196 (L) 04/10/2018  -Update B12 today   Restless legs Resolved on iron supplements-ferritin haf been as low as 16 previously   #migraines- recently well controlled on sparing fiorcet. ususally uses [employer] migraine tablets first (harder to find recently)  # hypertension- controlled on amlodipine 2.5 mg, hctz 25 mg    # Depression S: sleep issues more related to back. Depression feels controlled on paxil 10mg   dad passed 2019. caring for mom is stressor- lives with her and on dialysis.  Depression screen [facility] 2/9 07/19/2018  Decreased Interest 0  Down, Depressed, Hopeless 0  PHQ - 2 Score 0  Altered sleeping 2  Tired, decreased energy 0  Change in appetite 3  Feeling bad or failure about yourself  0  Trouble concentrating 0  Moving slowly or fidgety/restless 0  Suicidal thoughts 0  PHQ-9 Score 5  Difficult doing work/chores Not difficult at all  A/P: Appears well controlled despite current stressors- continue Paxil 10 mg  #prilosec for GERD has been helpful  Some high sugars in past- not sure if was fasting.  Also has family history of diabetes.  Request A1c today-this was completed  Advised 28-month follow-up.  1 year physical at the latest B12, cbc, cmp, lipid, a1c.-Last food 8 AM- so almost 8 hours.  Lab/Order associations:  Preventative health care - Plan: CBC, Comprehensive metabolic panel, Lipid panel, Hemoglobin A1c, Vitamin B12, IBC + Ferritin  Essential hypertension - Plan: CBC, Comprehensive metabolic panel, Lipid panel  Hyperglycemia - Plan: Hemoglobin A1c  Family history of diabetes mellitus - Plan:  Hemoglobin A1c  B12 deficiency - Plan:  Vitamin B12  Restless legs - Plan: IBC + Ferritin  Current smoker - Plan: POCT Urinalysis Dipstick (Automated), POCT Urinalysis Dipstick (Automated)  Primary osteoarthritis involving multiple joints  Constipation, unspecified constipation type  Migraine without status migrainosus, not intractable, unspecified migraine type  Recurrent major depressive disorder, in full remission ([place])  Gastroesophageal reflux disease without esophagitis  Meds ordered this encounter  Medications  . cyclobenzaprine (FLEXERIL) 5 MG tablet    Sig: Take 1 tablet (5 mg total) by mouth 3 (three) times daily as needed for muscle spasms.    Dispense:  30 tablet    Refill:  1   Return precautions advised.  Garret Reddish, MD

## 2018-07-20 ENCOUNTER — Encounter: Payer: BLUE CROSS/BLUE SHIELD | Admitting: Family Medicine

## 2018-07-20 LAB — COMPREHENSIVE METABOLIC PANEL
ALT: 32 U/L (ref 0–35)
AST: 30 U/L (ref 0–37)
Albumin: 4.2 g/dL (ref 3.5–5.2)
Alkaline Phosphatase: 66 U/L (ref 39–117)
BUN: 11 mg/dL (ref 6–23)
CO2: 30 mEq/L (ref 19–32)
Calcium: 9.7 mg/dL (ref 8.4–10.5)
Chloride: 101 mEq/L (ref 96–112)
Creatinine, Ser: 0.94 mg/dL (ref 0.40–1.20)
GFR: 76.3 mL/min (ref >60.00)
Glucose, Bld: 76 mg/dL (ref 70–99)
Potassium: 3.7 mEq/L (ref 3.5–5.1)
Sodium: 140 mEq/L (ref 135–145)
Total Bilirubin: 0.6 mg/dL (ref 0.2–1.2)
Total Protein: 6.9 g/dL (ref 6.0–8.3)

## 2018-07-20 LAB — CBC
HCT: 42.6 % (ref 36.0–46.0)
Hemoglobin: 15.2 g/dL — ABNORMAL HIGH (ref 12.0–15.0)
MCHC: 35.7 g/dL (ref 30.0–36.0)
MCV: 86.9 fl (ref 78.0–100.0)
Platelets: 232 10*3/uL (ref 150.0–400.0)
RBC: 4.89 Mil/uL (ref 3.87–5.11)
RDW: 14.8 % (ref 11.5–15.5)
WBC: 11.5 10*3/uL — ABNORMAL HIGH (ref 4.0–10.5)

## 2018-07-20 LAB — IBC + FERRITIN
Ferritin: 22.2 ng/mL (ref 10.0–291.0)
Iron: 56 ug/dL (ref 42–145)
Saturation Ratios: 13.9 % — ABNORMAL LOW (ref 20.0–50.0)
Transferrin: 288 mg/dL (ref 212.0–360.0)

## 2018-07-20 LAB — LIPID PANEL
Cholesterol: 168 mg/dL (ref 0–200)
HDL: 41.7 mg/dL (ref >39.00)
LDL Cholesterol: 101 mg/dL — ABNORMAL HIGH (ref 0–99)
NonHDL: 126.17
Total CHOL/HDL Ratio: 4
Triglycerides: 127 mg/dL (ref 0.0–149.0)
VLDL: 25.4 mg/dL (ref 0.0–40.0)

## 2018-07-20 LAB — VITAMIN B12: Vitamin B-12: 609 pg/mL (ref 211–911)

## 2018-07-20 LAB — HEMOGLOBIN A1C: Hgb A1c MFr Bld: 4.9 % (ref 4.6–6.5)

## 2018-07-21 DIAGNOSIS — Z1231 Encounter for screening mammogram for malignant neoplasm of breast: Secondary | ICD-10-CM | POA: Diagnosis not present

## 2018-07-21 LAB — HM MAMMOGRAPHY

## 2018-07-24 ENCOUNTER — Telehealth: Payer: Self-pay

## 2018-07-24 NOTE — Telephone Encounter (Signed)
Called pt to inform that PPW is ready for pick up. No answer Crm created!

## 2018-08-09 ENCOUNTER — Ambulatory Visit (INDEPENDENT_AMBULATORY_CARE_PROVIDER_SITE_OTHER): Payer: BC Managed Care – PPO | Admitting: Family Medicine

## 2018-08-09 ENCOUNTER — Encounter: Payer: Self-pay | Admitting: Family Medicine

## 2018-08-09 ENCOUNTER — Ambulatory Visit: Payer: Self-pay

## 2018-08-09 ENCOUNTER — Telehealth: Payer: Self-pay | Admitting: Family Medicine

## 2018-08-09 VITALS — BP 124/99 | HR 87 | Ht 68.0 in | Wt 248.0 lb

## 2018-08-09 DIAGNOSIS — Z20828 Contact with and (suspected) exposure to other viral communicable diseases: Secondary | ICD-10-CM

## 2018-08-09 DIAGNOSIS — F17200 Nicotine dependence, unspecified, uncomplicated: Secondary | ICD-10-CM | POA: Diagnosis not present

## 2018-08-09 DIAGNOSIS — I10 Essential (primary) hypertension: Secondary | ICD-10-CM

## 2018-08-09 DIAGNOSIS — Z20822 Contact with and (suspected) exposure to covid-19: Secondary | ICD-10-CM

## 2018-08-09 NOTE — Telephone Encounter (Signed)
Pt scheduled for virtual visit today at 4:40 pm.

## 2018-08-09 NOTE — Telephone Encounter (Signed)
Pt called stating that her daughter that lives with her has been exposed to COVID-19 through work. She is being tested today. Ms. Dickerson is concerned that she may need tested because of her close contact with her daughter. She denies symptoms. She states she takes care of her mother who has several risk factors. Care advice read to patient. Patient verbalized understanding of all instructions. Call placed to office. Note will be route to PCP for evaluation as instructed. Pt is aware. Reason for Disposition . [1] Living in area with community spread (identified by local PHD) BUT [2] NO symptoms  Answer Assessment - Initial Assessment Questions 1. CLOSE CONTACT: "Who is the person with the confirmed or suspected COVID-19 infection that you were exposed to?"     daughter has positive exposure at workplace 2. PLACE of CONTACT: "Where were you when you were exposed to COVID-19?" (e.g., home, school, medical waiting room; which city?)     Pt in contact with daughter at home  3. TYPE of CONTACT: "How much contact was there?" (e.g., sitting next to, live in same house, work in same office, same building)    Live in same house 4. DURATION of CONTACT: "How long were you in contact with the COVID-19 patient?" (e.g., a few seconds, passed by person, a few minutes, live with the patient)     Living together with daughter not confirmed 5. DATE of CONTACT: "When did you have contact with a COVID-19 patient?" (e.g., how many days ago)     yesterday 6. TRAVEL: "Have you traveled out of the country recently?" If so, "When and where?"     * Also ask about out-of-state travel, since the CDC has identified some high-risk cities for community spread in the [place].     * Note: Travel becomes less relevant if there is widespread community transmission where the patient lives.    no 7. COMMUNITY SPREAD: "Are there lots of cases of COVID-19 (community spread) where you live?" (See public health department website, if unsure)        [place] 8. SYMPTOMS: "Do you have any symptoms?" (e.g., fever, cough, breathing difficulty)     none 9. PREGNANCY OR POSTPARTUM: "Is there any chance you are pregnant?" "When was your last menstrual period?" "Did you deliver in the last 2 weeks?"    no 10. HIGH RISK: "Do you have any heart or lung problems? Do you have a weak immune system?" (e.g., CHF, COPD, asthma, HIV positive, chemotherapy, renal failure, diabetes mellitus, sickle cell anemia)       HTN  Protocols used: CORONAVIRUS (COVID-19) EXPOSURE-A-AH

## 2018-08-09 NOTE — Telephone Encounter (Signed)
Called pt and left VM to call the office.  

## 2018-08-09 NOTE — Telephone Encounter (Signed)
Patient: Abie Killian MRN: [id number] DOB: 06/07/1970  Reason for test: contact with positive covid 19 case  Insurance information    Primary Visit Coverage  Payer Plan Sponsor Code Group Number Group Name  BLUE CROSS [place] OTHER  [id number] [place]  Primary Visit Coverage Subscriber  ID Name [facility] Address  [id number] [place] [id number]      [place], [place] [address]  Secondary Visit Coverage  Payer Plan Sponsor Code Group Number Group Name  [place]  [id number]   Secondary Visit Coverage Subscriber  ID Name [facility] Address  [id number] [place] [id number] [address]      [place], [place] [address]

## 2018-08-09 NOTE — Telephone Encounter (Signed)
Pt returned call.   Called pt and left VM to call the office.

## 2018-08-09 NOTE — Patient Instructions (Signed)
There are no preventive care reminders to display for this patient.  Depression screen [facility] 2/9 07/19/2018 04/10/2018 07/12/2017  Decreased Interest 0 0 2  Down, Depressed, Hopeless 0 0 0  PHQ - 2 Score 0 0 2  Altered sleeping 2 3 1   Tired, decreased energy 0 1 1  Change in appetite 3 0 0  Feeling bad or failure about yourself  0 0 0  Trouble concentrating 0 0 0  Moving slowly or fidgety/restless 0 0 0  Suicidal thoughts 0 0 0  PHQ-9 Score 5 4 4   Difficult doing work/chores Not difficult at all Somewhat difficult Somewhat difficult

## 2018-08-09 NOTE — Telephone Encounter (Signed)
Patient returning call because she has not received a return call regarding testing. Pt advised that information provided during original call with triage nurse has been sent to Dr. Yong Channel to review and provide recommendations. Pt advised that once Dr. Yong Channel review the information she would be called with recommendations. Pt verbalized understanding. Pt states that her daughter does not have a PCP and was inquiring about testing as well. Pt informed that her daughter could contact [facility] Department to be referred for testing. Pt verbalized understanding.

## 2018-08-09 NOTE — Progress Notes (Signed)
Phone [phone]   Subjective:  Virtual visit via Video note. Chief complaint: Chief Complaint  Patient presents with  . covid 19 exposure  . Hypertension    This visit type was conducted due to national recommendations for restrictions regarding the COVID-19 Pandemic (e.g. social distancing).  This format is felt to be most appropriate for this patient at this time balancing risks to patient and risks to population by having him in for in person visit.  No physical exam was performed (except for noted visual exam or audio findings with Telehealth visits).    Our team/I connected with [name] at  4:40 PM EDT by a video enabled telemedicine application (doxy.me or caregility through epic) and verified that I am speaking with the correct person using two identifiers.  Location patient: Home-[place] Location provider: [facility], office Persons participating in the virtual visit:  patient  Our team/I discussed the limitations of evaluation and management by telemedicine and the availability of in person appointments. In light of current covid-19 pandemic, patient also understands that we are trying to protect them by minimizing in office contact if at all possible.  The patient expressed consent for telemedicine visit and agreed to proceed. Patient understands insurance will be billed.   ROS- No fever, chills, cough, shortness of breath, body aches, sore throat, or loss of taste or smell. Some hot flashes from menopause.    Past Medical History-  Patient Active Problem List   Diagnosis Date Noted  . Current smoker 07/19/2018    Priority: High  . Constipation 07/19/2018    Priority: Medium  . History of adenomatous polyp of colon 09/23/2016    Priority: Medium  . Migraines 05/06/2016    Priority: Medium  . HTN (hypertension) 11/21/2011    Priority: Medium  . Depression 09/06/2006    Priority: Medium  . Restless legs 07/19/2018    Priority: Low  . Ingrown toenail  12/20/2016    Priority: Low  . Family history of cancer of GI tract 12/06/2012    Priority: Low  . MENORRHAGIA 09/23/2009    Priority: Low  . PLANTAR FASCIITIS 09/20/2007    Priority: Low  . Osteoarthritis 09/19/2006    Priority: Low  . Allergic rhinitis 09/06/2006    Priority: Low  . GERD 09/06/2006    Priority: Low  . B12 deficiency 07/19/2018    Medications- reviewed and updated Current Outpatient Medications  Medication Sig Dispense Refill  . amLODipine (NORVASC) 2.5 MG tablet TAKE 1 TABLET BY MOUTH EVERY DAY 90 tablet 1  . cyclobenzaprine (FLEXERIL) 5 MG tablet Take 1 tablet (5 mg total) by mouth 3 (three) times daily as needed for muscle spasms. 30 tablet 1  . hydrochlorothiazide (HYDRODIURIL) 25 MG tablet TAKE 1 TABLET BY MOUTH EVERY DAY 90 tablet 1  . meloxicam (MOBIC) 7.5 MG tablet TAKE 1 TABLET (7.5 MG TOTAL) BY MOUTH DAILY AS NEEDED FOR PAIN. 90 tablet 1  . omeprazole (PRILOSEC) 40 MG capsule TAKE 1 CAPSULE BY MOUTH EVERY DAY 90 capsule 1  . PARoxetine (PAXIL) 10 MG tablet Take 1 tablet (10 mg total) by mouth daily. 90 tablet 1   No current facility-administered medications for this visit.      Objective:  BP (!) 124/99   Pulse 87   Ht 5\' 8"  (1.727 m)   Wt 248 lb (112.5 kg)   BMI 37.71 kg/m  self reported vitals Gen: NAD, resting comfortably Lungs: nonlabored, normal respiratory rate  Skin: appears dry, no obvious  rash     Assessment and Plan   # Testing for Covid-19.  S:Daughter was exposed to someone at work that tested positive for Covid-19. Daughter lives with her. Patients mother also lives with her who is on dialysis. Daughter is asymptomatic. She is currently asymptomatic. She is a smoker. She has concerns because she takes care of her mother who has health issues that would put her at risk if she was exposed to the virus.  A/P: Even though we cannot prove direct contact at this time until patient's daughter is tested- how high risk her mother is being  on dialysis is concerning- I believe we should move forward with COVID-19 testing.  Patient should remain out of work until negative testing result  #hypertension S: controlled on last visit on amlodipine 2.5 mg and hydrochlorothiazide 25 mg BP Readings from Last 3 Encounters:  08/09/18 (!) 124/99  07/19/18 125/84  06/21/18 (!) 129/98  A/P: Poor control today-patient states stress is very high due to potential COVID-19 contact and worrying about her mother-he agrees to monitor over the coming week and if not below 140/90 will let us know  # smoker S:1- 1.5 black and milds a day.   A/P: Advised complete cessation-we discussed risks to her health including current covid-19 pandemic-she is not ready to quit at this time  Future Appointments  Date Time Provider [place]  09/19/2018  8:00 AM Salvadore Dom, MD [place] None   Lab/Order associations:   ICD-10-CM   1. Essential hypertension  I10   2. Current smoker  F17.200   3. Exposure to Covid-19 Virus  Z20.828     Return precautions advised.  Garret Reddish, MD

## 2018-08-10 ENCOUNTER — Telehealth: Payer: Self-pay | Admitting: *Deleted

## 2018-08-10 ENCOUNTER — Other Ambulatory Visit: Payer: Self-pay

## 2018-08-10 DIAGNOSIS — Z20822 Contact with and (suspected) exposure to covid-19: Secondary | ICD-10-CM

## 2018-08-10 DIAGNOSIS — R6889 Other general symptoms and signs: Secondary | ICD-10-CM | POA: Diagnosis not present

## 2018-08-10 NOTE — Telephone Encounter (Signed)
Appt made for covid testing today @ [facility]. Instructions given and order placed

## 2018-08-16 ENCOUNTER — Encounter: Payer: Self-pay | Admitting: Family Medicine

## 2018-08-16 LAB — NOVEL CORONAVIRUS, NAA: SARS-CoV-2, NAA: NOT DETECTED

## 2018-08-26 DIAGNOSIS — S62308A Unspecified fracture of other metacarpal bone, initial encounter for closed fracture: Secondary | ICD-10-CM | POA: Diagnosis not present

## 2018-08-26 DIAGNOSIS — M79641 Pain in right hand: Secondary | ICD-10-CM | POA: Diagnosis not present

## 2018-09-13 NOTE — Progress Notes (Signed)
48 y.o. G27P2002 Divorced Black or African American Not Hispanic or Latino female here for annual exam.   Last year she lost 3 family members in 2019. She had random bleeding for a few years. Last cycle was in 7/19. She is having night sweats 3-4 x a night, hot flashes ~ 1 x an hour. Sexually active, occasional deep dyspareunia on the left, positional. Occasional needs a lubricant. She c/o a decrease in libido, able to enjoy, sometimes able to have orgasms.   She is taking miralax for constipation, it's helping some. She was having a BM 1 x a week, now having a BM every 2-3 days.      No LMP recorded. Patient is postmenopausal.          Sexually active: Yes.    The current method of family planning is tubal ligation.    Exercising: Yes.    walking Smoker:  Yes, smokes 1 cigar a day  Health Maintenance: Pap:  01/13/2016 ASCUS NEG HPV, 03-19-14 WNL History of abnormal Pap:  no MMG:  07/21/2018 Birads 1 negative Colonoscopy:  09/16/2016 polyps, repeat in 5 years BMD:   Never TDaP:  11-21-11 Gardasil: N/A   reports that she has been smoking cigars. She has never used smokeless tobacco. She reports current alcohol use. She reports that she does not use drugs. Couple of drinks a month. 2 grown children, 3 grandchildren, one on the way. Husbands daughter is in [place]  Past Medical History:  Diagnosis Date  . Allergy   . Arthritis   . Chronic headaches   . Depression   . GERD (gastroesophageal reflux disease)   . Hypertension   . Low back pain   . UTI (urinary tract infection)     Past Surgical History:  Procedure Laterality Date  . TUBAL LIGATION      Current Outpatient Medications  Medication Sig Dispense Refill  . amLODipine (NORVASC) 2.5 MG tablet TAKE 1 TABLET BY MOUTH EVERY DAY 90 tablet 1  . cyclobenzaprine (FLEXERIL) 5 MG tablet Take 1 tablet (5 mg total) by mouth 3 (three) times daily as needed for muscle spasms. 30 tablet 1  . hydrochlorothiazide (HYDRODIURIL) 25 MG tablet  TAKE 1 TABLET BY MOUTH EVERY DAY 90 tablet 1  . meloxicam (MOBIC) 7.5 MG tablet TAKE 1 TABLET (7.5 MG TOTAL) BY MOUTH DAILY AS NEEDED FOR PAIN. 90 tablet 1  . omeprazole (PRILOSEC) 40 MG capsule TAKE 1 CAPSULE BY MOUTH EVERY DAY 90 capsule 1  . gabapentin (NEURONTIN) 100 MG capsule Take one tablet po qhs, can increase by one tablet every 3 nights as needed to a total dose of 3 tablets (300 mg). 90 capsule 1   No current facility-administered medications for this visit.     Family History  Problem Relation Age of Onset  . Diabetes Father        mother  . Hyperlipidemia Father        mother  . Hypertension Father        mother  . Colon cancer Father        16s, and grandmother  . Lung cancer Father        in 36s  . Cancer - Other Sister        duodenal  . Stomach cancer Sister   . Diabetes Mother   . Hypertension Mother   . Hyperlipidemia Mother   . Kidney disease Mother        has fistula- could need dialysis at  any time  . Colon cancer Paternal Grandmother   . Colon cancer Maternal Aunt   . Esophageal cancer Neg Hx   . Liver cancer Neg Hx   . Pancreatic cancer Neg Hx   . Rectal cancer Neg Hx     Review of Systems  Constitutional:       Hot flashes Night sweats  HENT: Negative.   Eyes: Negative.   Respiratory: Negative.   Cardiovascular: Negative.   Gastrointestinal: Negative.   Endocrine: Negative.   Genitourinary: Negative.   Musculoskeletal: Negative.   Skin: Negative.   Allergic/Immunologic: Negative.   Neurological: Negative.   Hematological: Negative.   Psychiatric/Behavioral: Negative.     Exam:   BP 122/82 (BP Location: Right Arm, Patient Position: Sitting, Cuff Size: Normal)   Pulse 64   Resp 14   Wt 252.2 lb  BMI  37.24 @WEIGHTCHANGE @ Height:    Ht Readings from Last 3 Encounters:  09/19/18 5\' 9"  (1.753 m)  08/09/18 5\' 8"  (1.727 m)  07/19/18 5\' 8"  (1.727 m)    General appearance: alert, cooperative and appears stated age Head:  Normocephalic, without obvious abnormality, atraumatic Neck: no adenopathy, supple, symmetrical, trachea midline and thyroid normal to inspection and palpation Lungs: clear to auscultation bilaterally Cardiovascular: regular rate and rhythm Breasts: normal appearance, no masses or tenderness Abdomen: soft, non-tender; non distended,  no masses,  no organomegaly Extremities: extremities normal, atraumatic, no cyanosis or edema Skin: Skin color, texture, turgor normal. No rashes or lesions Lymph nodes: Cervical, supraclavicular, and axillary nodes normal. No abnormal inguinal nodes palpated Neurologic: Grossly normal   Pelvic: External genitalia:  no lesions              Urethra:  normal appearing urethra with no masses, tenderness or lesions              Bartholins and Skenes: normal                 Vagina: normal appearing vagina with normal color and discharge, no lesions              Cervix: no lesions               Bimanual Exam:  Uterus:  normal size, contour, position, consistency, mobility, non-tender              Adnexa: no masses, tender in the left adnexa               Rectovaginal: Confirms               Anus:  normal sphincter tone, no lesions  Chaperone was present for exam.  A:  Well Woman with normal exam  Menopausal vasomotor symptoms  Low libido  H/O HTN, smoker and elevated BMI  FH colon cancer    P:   Pap with hpv  Labs with primary  Discussed breast self exam  Discussed calcium and vit D intake  Mammogram UTD  Colonoscopy UTD  Discussed options for treating vasomotor symptoms, including behavior changes, over the counter options, SSRI's, gabapentin and HRT.   Discussed risks of HRT and that she does have risks for heart disease  Will start gabapentin at night, f/u in one month  Check testosterone levels  Information on libido given

## 2018-09-19 ENCOUNTER — Other Ambulatory Visit: Payer: Self-pay

## 2018-09-19 ENCOUNTER — Encounter: Payer: Self-pay | Admitting: Obstetrics and Gynecology

## 2018-09-19 ENCOUNTER — Ambulatory Visit: Payer: BC Managed Care – PPO | Admitting: Obstetrics and Gynecology

## 2018-09-19 VITALS — BP 122/82 | HR 84 | Temp 97.2°F | Ht 69.0 in | Wt 252.2 lb

## 2018-09-19 DIAGNOSIS — R6882 Decreased libido: Secondary | ICD-10-CM | POA: Diagnosis not present

## 2018-09-19 DIAGNOSIS — Z800 Family history of malignant neoplasm of digestive organs: Secondary | ICD-10-CM

## 2018-09-19 DIAGNOSIS — N951 Menopausal and female climacteric states: Secondary | ICD-10-CM

## 2018-09-19 DIAGNOSIS — Z6837 Body mass index (BMI) 37.0-37.9, adult: Secondary | ICD-10-CM

## 2018-09-19 DIAGNOSIS — Z124 Encounter for screening for malignant neoplasm of cervix: Secondary | ICD-10-CM

## 2018-09-19 DIAGNOSIS — Z8679 Personal history of other diseases of the circulatory system: Secondary | ICD-10-CM

## 2018-09-19 DIAGNOSIS — Z01419 Encounter for gynecological examination (general) (routine) without abnormal findings: Secondary | ICD-10-CM

## 2018-09-19 MED ORDER — GABAPENTIN 100 MG PO CAPS: ORAL_CAPSULE | ORAL | 1 refills | Status: DC

## 2018-09-19 NOTE — Patient Instructions (Signed)
EXERCISE AND DIET:  We recommended that you start or continue a regular exercise program for good health. Regular exercise means any activity that makes your heart beat faster and makes you sweat.  We recommend exercising at least 30 minutes per day at least 3 days a week, preferably 4 or 5.  We also recommend a diet low in fat and sugar.  Inactivity, poor dietary choices and obesity can cause diabetes, heart attack, stroke, and kidney damage, among others.   ° °ALCOHOL AND SMOKING:  Women should limit their alcohol intake to no more than 7 drinks/beers/glasses of wine (combined, not each!) per week. Moderation of alcohol intake to this level decreases your risk of breast cancer and liver damage. And of course, no recreational drugs are part of a healthy lifestyle.  And absolutely no smoking or even second hand smoke. Most people know smoking can cause heart and lung diseases, but did you know it also contributes to weakening of your bones? Aging of your skin?  Yellowing of your teeth and nails? ° °CALCIUM AND VITAMIN D:  Adequate intake of calcium and Vitamin D are recommended.  The recommendations for exact amounts of these supplements seem to change often, but generally speaking 1,200 mg of calcium (between diet and supplement) and 800 units of Vitamin D per day seems prudent. Certain women may benefit from higher intake of Vitamin D.  If you are among these women, your doctor will have told you during your visit.   ° °PAP SMEARS:  Pap smears, to check for cervical cancer or precancers,  have traditionally been done yearly, although recent scientific advances have shown that most women can have pap smears less often.  However, every woman still should have a physical exam from her gynecologist every year. It will include a breast check, inspection of the vulva and vagina to check for abnormal growths or skin changes, a visual exam of the cervix, and then an exam to evaluate the size and shape of the uterus and  ovaries.  And after 50 years of age, a rectal exam is indicated to check for rectal cancers. We will also provide age appropriate advice regarding health maintenance, like when you should have certain vaccines, screening for sexually transmitted diseases, bone density testing, colonoscopy, mammograms, etc.  ° °MAMMOGRAMS:  All women over 40 years old should have a yearly mammogram. Many facilities now offer a "3D" mammogram, which may cost around $50 extra out of pocket. If possible,  we recommend you accept the option to have the 3D mammogram performed.  It both reduces the number of women who will be called back for extra views which then turn out to be normal, and it is better than the routine mammogram at detecting truly abnormal areas.   ° °COLON CANCER SCREENING: Now recommend starting at age 45. At this time colonoscopy is not covered for routine screening until 50. There are take home tests that can be done between 45-49.  ° °COLONOSCOPY:  Colonoscopy to screen for colon cancer is recommended for all women at age 50.  We know, you hate the idea of the prep.  We agree, BUT, having colon cancer and not knowing it is worse!!  Colon cancer so often starts as a polyp that can be seen and removed at colonscopy, which can quite literally save your life!  And if your first colonoscopy is normal and you have no family history of colon cancer, most women don't have to have it again for   10 years.  Once every ten years, you can do something that may end up saving your life, right?  We will be happy to help you get it scheduled when you are ready.  Be sure to check your insurance coverage so you understand how much it will cost.  It may be covered as a preventative service at no cost, but you should check your particular policy.   ° ° ° °Breast Self-Awareness °Breast self-awareness means being familiar with how your breasts look and feel. It involves checking your breasts regularly and reporting any changes to your  health care provider. °Practicing breast self-awareness is important. A change in your breasts can be a sign of a serious medical problem. Being familiar with how your breasts look and feel allows you to find any problems early, when treatment is more likely to be successful. All women should practice breast self-awareness, including women who have had breast implants. °How to do a breast self-exam °One way to learn what is normal for your breasts and whether your breasts are changing is to do a breast self-exam. To do a breast self-exam: °Look for Changes ° °1. Remove all the clothing above your waist. °2. Stand in front of a mirror in a room with good lighting. °3. Put your hands on your hips. °4. Push your hands firmly downward. °5. Compare your breasts in the mirror. Look for differences between them (asymmetry), such as: °? Differences in shape. °? Differences in size. °? Puckers, dips, and bumps in one breast and not the other. °6. Look at each breast for changes in your skin, such as: °? Redness. °? Scaly areas. °7. Look for changes in your nipples, such as: °? Discharge. °? Bleeding. °? Dimpling. °? Redness. °? A change in position. °Feel for Changes °Carefully feel your breasts for lumps and changes. It is best to do this while lying on your back on the floor and again while sitting or standing in the shower or tub with soapy water on your skin. Feel each breast in the following way: °· Place the arm on the side of the breast you are examining above your head. °· Feel your breast with the other hand. °· Start in the nipple area and make ¾ inch (2 cm) overlapping circles to feel your breast. Use the pads of your three middle fingers to do this. Apply light pressure, then medium pressure, then firm pressure. The light pressure will allow you to feel the tissue closest to the skin. The medium pressure will allow you to feel the tissue that is a little deeper. The firm pressure will allow you to feel the tissue  close to the ribs. °· Continue the overlapping circles, moving downward over the breast until you feel your ribs below your breast. °· Move one finger-width toward the center of the body. Continue to use the ¾ inch (2 cm) overlapping circles to feel your breast as you move slowly up toward your collarbone. °· Continue the up and down exam using all three pressures until you reach your armpit. ° °Write Down What You Find ° °Write down what is normal for each breast and any changes that you find. Keep a written record with breast changes or normal findings for each breast. By writing this information down, you do not need to depend only on memory for size, tenderness, or location. Write down where you are in your menstrual cycle, if you are still menstruating. °If you are having trouble noticing differences   in your breasts, do not get discouraged. With time you will become more familiar with the variations in your breasts and more comfortable with the exam. How often should I examine my breasts? Examine your breasts every month. If you are breastfeeding, the best time to examine your breasts is after a feeding or after using a breast pump. If you menstruate, the best time to examine your breasts is 5-7 days after your period is over. During your period, your breasts are lumpier, and it may be more difficult to notice changes. When should I see my health care provider? See your health care provider if you notice:  A change in shape or size of your breasts or nipples.  A change in the skin of your breast or nipples, such as a reddened or scaly area.  Unusual discharge from your nipples.  A lump or thick area that was not there before.  Pain in your breasts.  Anything that concerns you.  Menopause and Hormone Replacement Therapy Menopause is a normal time of life when menstrual periods stop completely and the ovaries stop producing the female hormones estrogen and progesterone. This lack of hormones  can affect your health and cause undesirable symptoms. Hormone replacement therapy (HRT) can relieve some of those symptoms. What is hormone replacement therapy? HRT is the use of artificial (synthetic) hormones to replace hormones that your body has stopped producing because you have reached menopause. What are my options for HRT?  HRT may consist of the synthetic hormones estrogen and progestin, or it may consist of only estrogen (estrogen-only therapy). You and your health care provider will decide which form of HRT is best for you. If you choose to be on HRT and you have a uterus, estrogen and progestin are usually prescribed. Estrogen-only therapy is used for women who do not have a uterus. Possible options for taking HRT include:  Pills.  Patches.  Gels.  Sprays.  Vaginal cream.  Vaginal rings.  Vaginal inserts. The amount of hormone(s) that you take and how long you take the hormone(s) varies according to your health. It is important to:  Begin HRT with the lowest possible dosage.  Stop HRT as soon as your health care provider tells you to stop.  Work with your health care provider so that you feel informed and comfortable with your decisions. What are the benefits of HRT? HRT can reduce the frequency and severity of menopausal symptoms. Benefits of HRT vary according to the kind of symptoms that you have, how severe they are, and your overall health. HRT may help to improve the following symptoms of menopause:  Hot flashes and night sweats. These are sudden feelings of heat that spread over the face and body. The skin may turn red, like a blush. Night sweats are hot flashes that happen while you are sleeping or trying to sleep.  Bone loss (osteoporosis). The body loses calcium more quickly after menopause, causing the bones to become weaker. This can increase the risk for bone breaks (fractures).  Vaginal dryness. The lining of the vagina can become thin and dry, which can  cause pain during sex or cause infection, burning, or itching.  Urinary tract infections.  Urinary incontinence. This is the inability to control when you pass urine.  Irritability.  Short-term memory problems. What are the risks of HRT? Risks of HRT vary depending on your individual health and medical history. Risks of HRT also depend on whether you receive both estrogen and progestin or you  receive estrogen only. HRT may increase the risk of:  Spotting. This is when a small amount of blood leaks from the vagina unexpectedly.  Endometrial cancer. This cancer is in the lining of the uterus (endometrium).  Breast cancer.  Increased density of breast tissue. This can make it harder to find breast cancer on a breast X-ray (mammogram).  Stroke.  Heart disease.  Blood clots.  Gallbladder disease.  Liver disease. Risks of HRT can increase if you have any of the following conditions:  Endometrial cancer.  Liver disease.  Heart disease.  Breast cancer.  History of blood clots.  History of stroke. Follow these instructions at home:  Take over-the-counter and prescription medicines only as told by your health care provider.  Get mammograms, pelvic exams, and medical checkups as often as told by your health care provider.  Have Pap tests done as often as told by your health care provider. A Pap test is sometimes called a Pap smear. It is a screening test that is used to check for signs of cancer of the cervix and vagina. A Pap test can also identify the presence of infection or precancerous changes. Pap tests may be done: ? Every 3 years, starting at age 46. ? Every 5 years, starting after age 67, in combination with testing for human papillomavirus (HPV). ? More often or less often depending on other medical conditions you have, your age, and other risk factors.  It is up to you to get the results of your Pap test. Ask your health care provider, or the department that is  doing the test, when your results will be ready.  Keep all follow-up visits as told by your health care provider. This is important. Contact a health care provider if you have:  Pain or swelling in your legs.  Shortness of breath.  Chest pain.  Lumps or changes in your breasts or armpits.  Slurred speech.  Pain, burning, or bleeding when you urinate.  Unusual vaginal bleeding.  Dizziness or headaches.  Weakness or numbness in any part of your arms or legs.  Pain in your abdomen. Summary  Menopause is a normal time of life when menstrual periods stop completely and the ovaries stop producing the female hormones estrogen and progesterone.  Hormone replacement therapy (HRT) can relieve some of the symptoms of menopause.  HRT can reduce the frequency and severity of menopausal symptoms.  Risks of HRT vary depending on your individual health and medical history. This information is not intended to replace advice given to you by your health care provider. Make sure you discuss any questions you have with your health care provider. Document Released: 11/06/2002 Document Revised: 10/10/2017 Document Reviewed: 10/10/2017 Elsevier Patient Education  2020 [employer].

## 2018-09-20 ENCOUNTER — Encounter: Payer: Self-pay | Admitting: Obstetrics and Gynecology

## 2018-09-20 ENCOUNTER — Other Ambulatory Visit (HOSPITAL_COMMUNITY)
Admission: RE | Admit: 2018-09-20 | Discharge: 2018-09-20 | Disposition: A | Discharge status: Home / Self Care | Payer: BC Managed Care – PPO | Source: Ambulatory Visit | Attending: Obstetrics and Gynecology | Admitting: Obstetrics and Gynecology

## 2018-09-20 DIAGNOSIS — Z124 Encounter for screening for malignant neoplasm of cervix: Secondary | ICD-10-CM | POA: Diagnosis not present

## 2018-09-21 LAB — TESTT+TESTF+SHBG
Sex Hormone Binding: 18.4 nmol/L (ref 17.3–125.0)
Testosterone, Free: 0.9 pg/mL (ref 0.0–4.2)
Testosterone, Total, LC/MS: 12.2 ng/dL

## 2018-09-24 LAB — CYTOLOGY - PAP
Diagnosis: UNDETERMINED — AB
HPV: NOT DETECTED

## 2018-09-27 DIAGNOSIS — M79641 Pain in right hand: Secondary | ICD-10-CM | POA: Insufficient documentation

## 2018-09-28 DIAGNOSIS — M79641 Pain in right hand: Secondary | ICD-10-CM | POA: Diagnosis not present

## 2018-10-16 NOTE — Progress Notes (Deleted)
GYNECOLOGY  VISIT   HPI: 48 y.o.   Divorced Black or [place] American Not Hispanic or Latino  female   [phone] with Patient's last menstrual period was 06/05/2018.   here for     GYNECOLOGIC HISTORY: Patient's last menstrual period was 06/05/2018. Contraception:*** Menopausal hormone therapy: ***        OB History    Gravida  2   Para  2   Term  2   Preterm      AB      Living  2     SAB      TAB      Ectopic      Multiple      Live Births  2              Patient Active Problem List   Diagnosis Date Noted  . Current smoker 07/19/2018  . Constipation 07/19/2018  . B12 deficiency 07/19/2018  . Restless legs 07/19/2018  . Ingrown toenail 12/20/2016  . History of adenomatous polyp of colon 09/23/2016  . Migraines 05/06/2016  . Family history of cancer of GI tract 12/06/2012  . HTN (hypertension) 11/21/2011  . MENORRHAGIA 09/23/2009  . PLANTAR FASCIITIS 09/20/2007  . Osteoarthritis 09/19/2006  . Depression 09/06/2006  . Allergic rhinitis 09/06/2006  . GERD 09/06/2006    Past Medical History:  Diagnosis Date  . Allergy   . Arthritis   . Chronic headaches   . Depression   . GERD (gastroesophageal reflux disease)   . Hypertension   . Low back pain   . UTI (urinary tract infection)     Past Surgical History:  Procedure Laterality Date  . TUBAL LIGATION      Current Outpatient Medications  Medication Sig Dispense Refill  . amLODipine (NORVASC) 2.5 MG tablet TAKE 1 TABLET BY MOUTH EVERY DAY 90 tablet 1  . cyclobenzaprine (FLEXERIL) 5 MG tablet Take 1 tablet (5 mg total) by mouth 3 (three) times daily as needed for muscle spasms. 30 tablet 1  . gabapentin (NEURONTIN) 100 MG capsule Take one tablet po qhs, can increase by one tablet every 3 nights as needed to a total dose of 3 tablets (300 mg). 90 capsule 1  . hydrochlorothiazide (HYDRODIURIL) 25 MG tablet TAKE 1 TABLET BY MOUTH EVERY DAY 90 tablet 1  . meloxicam (MOBIC) 7.5 MG tablet TAKE 1  TABLET (7.5 MG TOTAL) BY MOUTH DAILY AS NEEDED FOR PAIN. 90 tablet 1  . omeprazole (PRILOSEC) 40 MG capsule TAKE 1 CAPSULE BY MOUTH EVERY DAY 90 capsule 1   No current facility-administered medications for this visit.      ALLERGIES: Amoxicillin  Family History  Problem Relation Age of Onset  . Diabetes Father        mother  . Hyperlipidemia Father        mother  . Hypertension Father        mother  . Colon cancer Father        63s, and grandmother  . Lung cancer Father        in 63s  . Cancer - Other Sister        duodenal  . Stomach cancer Sister   . Diabetes Mother   . Hypertension Mother   . Hyperlipidemia Mother   . Kidney disease Mother        has fistula- could need dialysis at any time  . Colon cancer Paternal Grandmother   . Colon cancer Maternal Aunt   .  Esophageal cancer Neg Hx   . Liver cancer Neg Hx   . Pancreatic cancer Neg Hx   . Rectal cancer Neg Hx     Social History   Socioeconomic History  . Marital status: Divorced    Spouse name: Not on file  . Number of children: Not on file  . Years of education: Not on file  . Highest education level: Not on file  Occupational History  . Not on file  Social Needs  . Financial resource strain: Not on file  . Food insecurity    Worry: Not on file    Inability: Not on file  . Transportation needs    Medical: Not on file    Non-medical: Not on file  Tobacco Use  . Smoking status: Current Every Day Smoker    Types: Cigars  . Smokeless tobacco: Never Used  . Tobacco comment: black and mild one per day  Substance and Sexual Activity  . Alcohol use: Yes    Alcohol/week: 0.0 - 2.0 standard drinks    Comment: occ  . Drug use: No  . Sexual activity: Yes    Partners: Male    Birth control/protection: Surgical  Lifestyle  . Physical activity    Days per week: Not on file    Minutes per session: Not on file  . Stress: Not on file  Relationships  . Social Herbalist on phone: Not on file     Gets together: Not on file    Attends religious service: Not on file    Active member of club or organization: Not on file    Attends meetings of clubs or organizations: Not on file    Relationship status: Not on file  . Intimate partner violence    Fear of current or ex partner: Not on file    Emotionally abused: Not on file    Physically abused: Not on file    Forced sexual activity: Not on file  Other Topics Concern  . Not on file  Social History Narrative   Married May 13th 2017. 2 children- son 25 (married) and daughter 56. Granddaughter august 2017.       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    ROS  PHYSICAL EXAMINATION:    LMP 06/05/2018     General appearance: alert, cooperative and appears stated age Neck: no adenopathy, supple, symmetrical, trachea midline and thyroid {CHL AMB PHY EX THYROID NORM DEFAULT:[id number]::"normal to inspection and palpation"} Breasts: {Exam; breast:13139::"normal appearance, no masses or tenderness"} Abdomen: soft, non-tender; non distended, no masses,  no organomegaly  Pelvic: External genitalia:  no lesions              Urethra:  normal appearing urethra with no masses, tenderness or lesions              Bartholins and Skenes: normal                 Vagina: normal appearing vagina with normal color and discharge, no lesions              Cervix: {CHL AMB PHY EX CERVIX NORM DEFAULT:[phone]::"no lesions"}              Bimanual Exam:  Uterus:  {CHL AMB PHY EX UTERUS NORM DEFAULT:[phone]::"normal size, contour, position, consistency, mobility, non-tender"}              Adnexa: {CHL AMB PHY  EX ADNEXA NO MASS [phone]::"no mass, fullness, tenderness"}              Rectovaginal: {yes no:[id number]}.  Confirms.              Anus:  normal sphincter tone, no lesions  Chaperone was present for exam.  ASSESSMENT     PLAN    An After Visit Summary was printed and given to the  patient.  *** minutes face to face time of which over 50% was spent in counseling.

## 2018-10-17 ENCOUNTER — Encounter: Payer: Self-pay | Admitting: Obstetrics and Gynecology

## 2018-10-17 ENCOUNTER — Ambulatory Visit: Payer: BC Managed Care – PPO | Admitting: Obstetrics and Gynecology

## 2018-10-25 DIAGNOSIS — M79641 Pain in right hand: Secondary | ICD-10-CM | POA: Diagnosis not present

## 2018-10-30 ENCOUNTER — Other Ambulatory Visit: Payer: Self-pay | Admitting: Obstetrics and Gynecology

## 2018-10-30 ENCOUNTER — Other Ambulatory Visit: Payer: Self-pay | Admitting: Family Medicine

## 2018-10-30 DIAGNOSIS — I10 Essential (primary) hypertension: Secondary | ICD-10-CM

## 2018-10-30 NOTE — Telephone Encounter (Signed)
Medication refill request: Gabapentin 100 mg Last AEX:  09/19/2018 Next AEX: 09/25/2019 Last MMG (if hormonal medication request): 07/21/2018 [facility] 1 negative Refill authorized:  Gabapentin 100 mg   Left message to call [facility] at [phone]. Patient needs a follow up appointment for Gabapentin.

## 2018-10-30 NOTE — Telephone Encounter (Signed)
Patient is returning a call to [name]. °

## 2018-10-31 NOTE — Telephone Encounter (Signed)
Spoke with patient. Patient states that she has been taking Gabapentin 100 mg daily. This dose has been working well until recently. Patient would like to start taking 200 mg daily and see how she does. Patient needs a 90 day supply. Advised will review with Dr.Jertson as she was to return for a follow up appointment regarding Gabapentin.   Dr.Jertson, okay to send Gabapentin 100 mg take 2 tablets daily #[id number] to see how patient does on increased dose?

## 2018-10-31 NOTE — Telephone Encounter (Signed)
I called in 90 tablets of Gabapentin with one refill at the end of July. She should be able to get that refill. Have her take 2 tablets a day and f/u in one month.

## 2018-10-31 NOTE — Telephone Encounter (Signed)
Left message to call [facility] at [phone]. Need to know what dose of Gabapentin patient is taking. Patient is also due for follow up OV for Gabapentin.

## 2018-11-03 DIAGNOSIS — M79641 Pain in right hand: Secondary | ICD-10-CM | POA: Diagnosis not present

## 2018-11-05 ENCOUNTER — Telehealth: Payer: Self-pay

## 2018-11-05 NOTE — Telephone Encounter (Signed)
OK to provide work note?

## 2018-11-05 NOTE — Telephone Encounter (Signed)
Spoke with patient. Advised of message as seen below from Dr.Jertson. Patient verbalizes understanding. Encounter closed. 

## 2018-11-05 NOTE — Telephone Encounter (Signed)
Note sent to pt via MyChart

## 2018-11-05 NOTE — Telephone Encounter (Signed)
Copied from [facility] [id number]. Topic: General - Inquiry >> Nov 05, 2018 10:53 AM Scherrie Gerlach wrote: Reason for CRM:  pt states she had to miss work on [place] 9/08 due to leg cramping.  Pt states she has seen Dr Yong Channel for this issue in the past.   Her employer is asking for a work note for that day. Pt did not know they would ask for this, or would have made apt to see the dr. If Dr Yong Channel will do this, please put on mychart. Pt states she needs asap. Please advise

## 2018-11-05 NOTE — Telephone Encounter (Signed)
To whom it may concern,  Please excuse patient from work on Tuesday, September 8.  Patient was dealing with significant leg cramping- I have asked her to see me for follow-up if this recurs again in the future.  Thanks, Garret Reddish

## 2018-11-08 DIAGNOSIS — M79641 Pain in right hand: Secondary | ICD-10-CM | POA: Diagnosis not present

## 2018-11-15 DIAGNOSIS — Z20828 Contact with and (suspected) exposure to other viral communicable diseases: Secondary | ICD-10-CM | POA: Diagnosis not present

## 2018-11-22 ENCOUNTER — Other Ambulatory Visit: Payer: Self-pay

## 2018-11-22 DIAGNOSIS — Z20822 Contact with and (suspected) exposure to covid-19: Secondary | ICD-10-CM

## 2018-11-22 DIAGNOSIS — Z20828 Contact with and (suspected) exposure to other viral communicable diseases: Secondary | ICD-10-CM | POA: Diagnosis not present

## 2018-11-23 LAB — NOVEL CORONAVIRUS, NAA: SARS-CoV-2, NAA: NOT DETECTED

## 2018-11-29 DIAGNOSIS — Z23 Encounter for immunization: Secondary | ICD-10-CM | POA: Diagnosis not present

## 2018-12-03 ENCOUNTER — Other Ambulatory Visit: Payer: Self-pay

## 2018-12-03 DIAGNOSIS — K219 Gastro-esophageal reflux disease without esophagitis: Secondary | ICD-10-CM

## 2018-12-03 MED ORDER — OMEPRAZOLE 40 MG PO CPDR: DELAYED_RELEASE_CAPSULE | ORAL | 0 refills | Status: DC

## 2018-12-25 ENCOUNTER — Other Ambulatory Visit: Payer: Self-pay

## 2018-12-25 DIAGNOSIS — Z20822 Contact with and (suspected) exposure to covid-19: Secondary | ICD-10-CM

## 2018-12-26 LAB — NOVEL CORONAVIRUS, NAA: SARS-CoV-2, NAA: NOT DETECTED

## 2018-12-28 ENCOUNTER — Ambulatory Visit (INDEPENDENT_AMBULATORY_CARE_PROVIDER_SITE_OTHER): Payer: BC Managed Care – PPO | Admitting: Family Medicine

## 2018-12-28 ENCOUNTER — Encounter: Payer: Self-pay | Admitting: Family Medicine

## 2018-12-28 VITALS — Temp 97.2°F

## 2018-12-28 DIAGNOSIS — J329 Chronic sinusitis, unspecified: Secondary | ICD-10-CM

## 2018-12-28 DIAGNOSIS — B9689 Other specified bacterial agents as the cause of diseases classified elsewhere: Secondary | ICD-10-CM | POA: Diagnosis not present

## 2018-12-28 MED ORDER — AZELASTINE HCL 0.1 % NA SOLN: 2.0000 | Freq: Two times a day (BID) | NASAL | 12 refills | Status: DC

## 2018-12-28 MED ORDER — FLUTICASONE PROPIONATE 50 MCG/ACT NA SUSP: 2.0000 | Freq: Every day | NASAL | 6 refills | Status: DC

## 2018-12-28 MED ORDER — DOXYCYCLINE HYCLATE 100 MG PO TABS: 100.0000 mg | ORAL_TABLET | Freq: Two times a day (BID) | ORAL | 0 refills | Status: DC

## 2018-12-28 NOTE — Progress Notes (Signed)
   Chief Complaint:  Tonya Suarez is a 48 y.o. female who presents today for a virtual office visit with a chief complaint of sinus congestion.   Assessment/Plan:  Sinusitis Covid test reportedly negative.  Given 2-week course, will start course of doxycycline.  Also start Flonase and Astelin.  Encouraged good oral hydration.  Discussed reasons to return to care.  Follow-up as needed.    Subjective:  HPI:  Sinus Congestion Started 2 weeks ago. Worsening.  Had a sick contact at work.  She was tested for Covid which was negative.  Associated symptoms include sputum production, hoarseness, scratchy throat, and cough.  No fevers or chills.  Feels similar to prior sinus infections.  No body aches.  Tried Mucinex and other over-the-counter medications which is helped.  No other treatments tried.  No obvious aggravating or alleviating factors.  ROS: Per HPI  PMH: She reports that she has been smoking cigars. She has never used smokeless tobacco. She reports current alcohol use. She reports that she does not use drugs.      Objective/Observations  Physical Exam: Gen: NAD, resting comfortably Pulm: Normal work of breathing Neuro: Grossly normal, moves all extremities Psych: Normal affect and thought content  No results found for this or any previous visit (from the past 24 hour(s)).   Virtual Visit via Video   I connected with Tonya Suarez on 12/28/18 at 11:00 AM EST by a video enabled telemedicine application and verified that I am speaking with the correct person using two identifiers. I discussed the limitations of evaluation and management by telemedicine and the availability of in person appointments. The patient expressed understanding and agreed to proceed.   Patient location: Home Provider location: [place] participating in the virtual visit: Myself and Patient     Algis Greenhouse. Jerline Pain, MD 12/28/2018 11:02 AM

## 2019-01-24 ENCOUNTER — Encounter (HOSPITAL_COMMUNITY): Payer: Self-pay

## 2019-01-24 ENCOUNTER — Ambulatory Visit (HOSPITAL_COMMUNITY)
Admission: EM | Admit: 2019-01-24 | Discharge: 2019-01-24 | Disposition: A | Discharge status: Home / Self Care | Payer: BC Managed Care – PPO | Attending: Family Medicine | Admitting: Family Medicine

## 2019-01-24 ENCOUNTER — Other Ambulatory Visit: Payer: Self-pay

## 2019-01-24 DIAGNOSIS — M5442 Lumbago with sciatica, left side: Secondary | ICD-10-CM

## 2019-01-24 MED ORDER — TRAMADOL HCL 50 MG PO TABS: 50.0000 mg | ORAL_TABLET | Freq: Four times a day (QID) | ORAL | 0 refills | Status: DC | PRN

## 2019-01-24 MED ORDER — PREDNISONE 10 MG (21) PO TBPK: ORAL_TABLET | Freq: Every day | ORAL | 0 refills | Status: DC

## 2019-01-24 NOTE — Discharge Instructions (Addendum)
Be aware, pain medications may cause drowsiness. Please do not drive, operate heavy machinery or make important decisions while on this medication, it can cloud your judgement.  HOME CARE INSTRUCTIONS: For many people, back pain returns. Since low back pain is rarely dangerous, it is often a condition that people can learn to manage on their own. Please remain active. It is stressful on the back to sit or stand in one place. Do not sit, drive, or stand in one place for more than 30 minutes at a time. Take short walks on level surfaces as soon as pain allows. Try to increase the length of time you walk each day. Do not stay in bed. Resting more than 1 or 2 days can delay your recovery. Do not avoid exercise or work. Your body is made to move. It is not dangerous to be active, even though your back may hurt. Your back will likely heal faster if you return to being active before your pain is gone. Over-the-counter medicines to reduce pain and inflammation are often the most helpful.  SEEK MEDICAL CARE IF: You have pain that is not relieved with rest or medicine. You have pain that does not improve in 1 week. You have new symptoms. You are generally not feeling well.  SEEK IMMEDIATE MEDICAL CARE IF: You have pain that radiates from your back into your legs. You develop new bowel or bladder control problems. You have unusual weakness or numbness in your arms or legs. You develop nausea or vomiting. You develop abdominal pain. You feel faint.

## 2019-01-24 NOTE — ED Triage Notes (Signed)
Patient presents to Urgent Care with complaints of lower back pain since 5 days ago after she took a long drive. Patient reports it has progressively gotten worse, it is very painful for her to go from sitting to standing or vice versa.  Pt has been taking a muscle relaxer at home, which has not helped.

## 2019-01-25 ENCOUNTER — Ambulatory Visit (INDEPENDENT_AMBULATORY_CARE_PROVIDER_SITE_OTHER): Payer: BC Managed Care – PPO

## 2019-01-25 ENCOUNTER — Ambulatory Visit (INDEPENDENT_AMBULATORY_CARE_PROVIDER_SITE_OTHER): Payer: BC Managed Care – PPO | Admitting: Family Medicine

## 2019-01-25 ENCOUNTER — Other Ambulatory Visit: Payer: BC Managed Care – PPO

## 2019-01-25 ENCOUNTER — Encounter: Payer: Self-pay | Admitting: Family Medicine

## 2019-01-25 VITALS — BP 124/90 | HR 68 | Temp 97.4°F | Ht 69.0 in | Wt 252.0 lb

## 2019-01-25 DIAGNOSIS — M5442 Lumbago with sciatica, left side: Secondary | ICD-10-CM

## 2019-01-25 DIAGNOSIS — M545 Low back pain: Secondary | ICD-10-CM | POA: Diagnosis not present

## 2019-01-25 MED ORDER — HYDROCODONE-ACETAMINOPHEN 5-325 MG PO TABS: 1.0000 | ORAL_TABLET | Freq: Four times a day (QID) | ORAL | 0 refills | Status: DC | PRN

## 2019-01-25 NOTE — Patient Instructions (Addendum)
Health Maintenance Due  Topic Date Due  . INFLUENZA VACCINE -12/06/2018 09/22/2018   Schedule x-ray slot at [facility] at the desk before you go  When you arrive at [facility]- please call [phone] TO CHECK IN BY PHONE.  Can try hydrocodone over the weekend- hoping prednisone kicks in within a few days and we can go back to tramadol.   Separate tramadol and hydrocodone by 6 hours at least and do not drive on either.

## 2019-01-25 NOTE — Progress Notes (Signed)
Phone [phone] In person visit   Subjective:   Tonya Suarez is a 48 y.o. year old very pleasant female patient who presents for/with See problem oriented charting Chief Complaint  Patient presents with   Back Pain    ROS- see ROS below   This visit occurred during the SARS-CoV-2 public health emergency.  Safety protocols were in place, including screening questions prior to the visit, additional usage of staff PPE, and extensive cleaning of exam room while observing appropriate contact time as indicated for disinfecting solutions.   Past Medical History-  Patient Active Problem List   Diagnosis Date Noted   Current smoker 07/19/2018    Priority: High   Constipation 07/19/2018    Priority: Medium   History of adenomatous polyp of colon 09/23/2016    Priority: Medium   Migraines 05/06/2016    Priority: Medium   HTN (hypertension) 11/21/2011    Priority: Medium   Depression 09/06/2006    Priority: Medium   Restless legs 07/19/2018    Priority: Low   Ingrown toenail 12/20/2016    Priority: Low   Family history of cancer of GI tract 12/06/2012    Priority: Low   MENORRHAGIA 09/23/2009    Priority: Low   PLANTAR FASCIITIS 09/20/2007    Priority: Low   Osteoarthritis 09/19/2006    Priority: Low   Allergic rhinitis 09/06/2006    Priority: Low   GERD 09/06/2006    Priority: Low   B12 deficiency 07/19/2018    Medications- reviewed and updated Current Outpatient Medications  Medication Sig Dispense Refill   amLODipine (NORVASC) 2.5 MG tablet TAKE 1 TABLET BY MOUTH EVERY DAY 90 tablet 1   azelastine (ASTELIN) 0.1 % nasal spray Place 2 sprays into both nostrils 2 (two) times daily. 30 mL 12   cyclobenzaprine (FLEXERIL) 5 MG tablet Take 1 tablet (5 mg total) by mouth 3 (three) times daily as needed for muscle spasms. 30 tablet 1   fluticasone (FLONASE) 50 MCG/ACT nasal spray Place 2 sprays into both nostrils daily. 16 g 6   gabapentin  (NEURONTIN) 100 MG capsule Take one tablet po qhs, can increase by one tablet every 3 nights as needed to a total dose of 3 tablets (300 mg). 90 capsule 1   hydrochlorothiazide (HYDRODIURIL) 25 MG tablet TAKE 1 TABLET BY MOUTH EVERY DAY 90 tablet 1   meloxicam (MOBIC) 7.5 MG tablet TAKE 1 TABLET (7.5 MG TOTAL) BY MOUTH DAILY AS NEEDED FOR PAIN. 90 tablet 1   omeprazole (PRILOSEC) 40 MG capsule TAKE 1 CAPSULE BY MOUTH EVERY DAY; patient needs office visit for further refills 90 capsule 0   predniSONE (STERAPRED UNI-PAK 21 TAB) 10 MG (21) TBPK tablet Take by mouth daily. Take as directed. 21 tablet 0   traMADol (ULTRAM) 50 MG tablet Take 1 tablet (50 mg total) by mouth every 6 (six) hours as needed. 15 tablet 0   No current facility-administered medications for this visit.      Objective:  BP 124/90    Pulse 68    Temp (!) 97.4 F (36.3 C)    Ht 5\' 9"  (1.753 m)    Wt 252 lb (114.3 kg)    LMP 06/05/2018    SpO2 97%    BMI 37.21 kg/m  Gen: NAD, resting comfortably CV: RRR no murmurs rubs or gallops Lungs: CTAB no crackles, wheeze, rhonchi Abdomen: Obese Ext: no edema Skin: warm, dry  Back - Normal skin, Spine with normal alignment and no deformity.  Minimal tenderness to vertebral process palpation.  Severe tenderness to left paraspinous muscles and with spasm.  Did not test range of motion-patient strongly prefer to lay on the bed on her right side and stay still.   Neuro- no saddle anesthesia, 5/5 strength lower extremities    Assessment and Plan  Lower back pain with sciatica on the left.  History of degenerative disc disease S:pt c/o of sharp lower back pain that started last Saturday when they were out shopping. Started suddenly but no injury.  She rates the pain 10/10 and has gotten progressively worse since it started.  she went to urgent care yesterday and they gave her tramadol and a steroid pack. She started steroid this morning. Slight numbness/tingling going into the left leg.  Feels better laying down, worse with sitting up- can feel sharp pain in left low back with this. Did not do x-rays.   Pain is in her left low back and buttocks and radiates down into the left leg but does not go past the knee. No significant midline pain. Now noting some pain shooting into right side.   Flexeril yesterday did not give any relief. She took tramadol x2 yesterday afternoon and reduced pain to 8/10. She is still taking meloxicam nightly.   ROS-No saddle anesthesia, bladder incontinence, fecal incontinence, weakness in extremity.  Does report mild numbness or tingling in extremity. History negative for trauma, history of cancer, fever, chills, unintentional weight loss, recent bacterial infection, recent IV drug use, HIV, pain worse at night or while supine.  A/P: 48 year old female with history of degenerative disc disease of lumbar spine now with severe/debilitating low back pain with potential radicular pain down to left knee along with some paresthesias.  She has been trying NSAIDs, Flexeril, tramadol 100 mg and pain only down to 8 out of 10.  She would like to have an x-ray completed and we ordered this for [facility]-we discussed typically would not order this for at least 6 weeks after an acute flareup of pain but with severity of pain I thought it was reasonable to proceed to rule out bony mass or fracture.  I am hopeful with the sciatica component that she developed some relief within 48 hours to 72 hours of steroid though discussed can take up to 2 weeks to see more significant improvement.  I did give her some hydrocodone to use while waiting to see if the prednisone will "kick in" and discussed wanted her to convert back to tramadol as soon if she feels pain trending down.   #Hypertension S: Compliant with amlodipine 2.5 mg and hydrochlorothiazide 25 mg A/P: Mild poor control of diastolic reading today-honestly with her level of pain I am thankful that her blood pressure is not  more significant.  We can follow-up at next visit to ensure improvement  Recommended follow up: As needed if back fails to improve within 2 weeks or symptoms worsen or if she develops new red flag symptoms from ROS Future Appointments  Date Time Provider [facility]  01/25/2019  1:30 PM [facility] DG 1 [facility] None  01/25/2019  2:00 PM [facility] LAB [facility]  09/25/2019  8:00 AM Salvadore Dom, MD [facility] None    Lab/Order associations:   ICD-10-CM   1. Acute left-sided low back pain with left-sided sciatica  M54.42 DG Lumbar Spine Complete   [facility] reviewed today-no concerning fill patterns.  Tramadol fill may not have loaded yet. Meds ordered this encounter  Medications   HYDROcodone-acetaminophen (NORCO/VICODIN)  5-325 MG tablet    Sig: Take 1 tablet by mouth every 6 (six) hours as needed for severe pain (do not drive for 8 hours after taking.).    Dispense:  16 tablet    Refill:  0    Return precautions advised.  Garret Reddish, MD

## 2019-01-26 ENCOUNTER — Other Ambulatory Visit: Payer: Self-pay

## 2019-01-26 DIAGNOSIS — M545 Low back pain, unspecified: Secondary | ICD-10-CM

## 2019-01-26 NOTE — ED Provider Notes (Signed)
[facility] [facility]   [id number] 01/24/19 Arrival Time: [id number]  ASSESSMENT & PLAN:  1. Acute left-sided low back pain with left-sided sciatica      Able to ambulate here and hemodynamically stable. No indication for imaging of back at this time given no trauma and normal neurological exam. Discussed.  Meds ordered this encounter  Medications  . predniSONE (STERAPRED UNI-PAK 21 TAB) 10 MG (21) TBPK tablet    Sig: Take by mouth daily. Take as directed.    Dispense:  21 tablet    Refill:  0  . traMADol (ULTRAM) 50 MG tablet    Sig: Take 1 tablet (50 mg total) by mouth every 6 (six) hours as needed.    Dispense:  15 tablet    Refill:  0    Medication sedation precautions given. Encourage ROM/movement as tolerated.  Recommend: Follow-up Information    Marin Olp, MD.   Specialty: Family Medicine Why: If worsening or failing to improve as anticipated. Contact information: [place] [address] [phone]        [facility].   Specialty: Urgent Care Why: As needed. Contact information: [place] [phone]          Reviewed expectations re: course of current medical issues. Questions answered. Outlined signs and symptoms indicating need for more acute intervention. Patient verbalized understanding. After Visit Summary given.   SUBJECTIVE: History from: patient.  Tonya Suarez is a 48 y.o. female who presents with complaint of intermittent left sided lower back discomfort. Onset gradual. First noted within the past week. Injury/trama: no. History of back problems requiring medical care: occasional. Pain described as aching and with occasional sharp pains that radiate down LLE. Aggravating factors: have not been identified. Alleviating factors: have not been identified. Progressive LE weakness or saddle anesthesia: none. Extremity sensation changes  or weakness: none. Ambulatory without difficulty. Normal bowel/bladder habits: yes; without urinary retention. Normal PO intake without n/v. No associated abdominal pain/n/v. Self treatment: has tried a muscle relaxer, with minimal relief.  Reports no chronic steroid use, fevers, IV drug use, or recent back surgeries or procedures.  ROS: As per HPI. All other systems negative.   OBJECTIVE:  Vitals:   01/24/19 1055  BP: 130/84  Pulse: 78  Resp: 16  Temp: 98.2 F (36.8 C)  TempSrc: Oral  SpO2: 100%    General appearance: alert; no distress HEENT: [place]; AT Neck: supple with FROM; without midline tenderness CV: RRR Lungs: unlabored respirations; symmetrical air entry Abdomen: soft, non-tender; non-distended Back: mild  and poorly localized tenderness to palpation over left lumbar musculature and left upper buttock; FROM at waist; bruising: none; without midline tenderness Extremities: without edema; symmetrical without gross deformities; normal ROM of bilateral LE Skin: warm and dry Neurologic: normal gait; normal reflexes of bilateral LE; normal sensation of bilateral LE; normal strength of bilateral LE Psychological: alert and cooperative; normal mood and affect    Allergies  Allergen Reactions  . Amoxicillin Swelling    Lip swelling    Past Medical History:  Diagnosis Date  . Allergy   . Arthritis   . Chronic headaches   . Depression   . GERD (gastroesophageal reflux disease)   . Hypertension   . Low back pain   . UTI (urinary tract infection)    Social History   Socioeconomic History  . Marital status: Married    Spouse name: Not on file  .  Number of children: Not on file  . Years of education: Not on file  . Highest education level: Not on file  Occupational History  . Not on file  Social Needs  . Financial resource strain: Not on file  . Food insecurity    Worry: Not on file    Inability: Not on file  . Transportation needs    Medical: Not on file     Non-medical: Not on file  Tobacco Use  . Smoking status: Current Every Day Smoker    Types: Cigars  . Smokeless tobacco: Never Used  . Tobacco comment: black and mild one per day  Substance and Sexual Activity  . Alcohol use: Yes    Alcohol/week: 0.0 - 2.0 standard drinks    Comment: occ  . Drug use: No  . Sexual activity: Yes    Partners: Male    Birth control/protection: Surgical  Lifestyle  . Physical activity    Days per week: Not on file    Minutes per session: Not on file  . Stress: Not on file  Relationships  . Social Herbalist on phone: Not on file    Gets together: Not on file    Attends religious service: Not on file    Active member of club or organization: Not on file    Attends meetings of clubs or organizations: Not on file    Relationship status: Not on file  . Intimate partner violence    Fear of current or ex partner: Not on file    Emotionally abused: Not on file    Physically abused: Not on file    Forced sexual activity: Not on file  Other Topics Concern  . Not on file  Social History Narrative   Married May 13th 2017. 2 children- son 37 (married) and daughter 40. Granddaughter august 2017.       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 Diabetes Father        mother  . Hyperlipidemia Father        mother  . Hypertension Father        mother  . Colon cancer Father        26s, and grandmother  . Lung cancer Father        in 48s  . Cancer - Other Sister        duodenal  . Stomach cancer Sister   . Diabetes Mother   . Hypertension Mother   . Hyperlipidemia Mother   . Kidney disease Mother        has fistula- could need dialysis at any time  . Colon cancer Paternal Grandmother   . Colon cancer Maternal Aunt   . Esophageal cancer Neg Hx   . Liver cancer Neg Hx   . Pancreatic cancer Neg Hx   . Rectal cancer Neg Hx    Past Surgical  History:  Procedure Laterality Date  . TUBAL LIGATION       Vanessa Kick, MD 01/26/19 [phone]

## 2019-01-28 ENCOUNTER — Telehealth: Payer: Self-pay | Admitting: Family Medicine

## 2019-01-28 NOTE — Telephone Encounter (Signed)
Should be able to see sports medicine today- try to work to get her in- looks like Dr. Georgina Snell has openings

## 2019-01-28 NOTE — Telephone Encounter (Signed)
Pt's sister calling.  States pt is still having a lot of back pain and would like to know what the doctor recommends at this time.

## 2019-01-28 NOTE — Telephone Encounter (Signed)
Pt seeing [employer].

## 2019-01-28 NOTE — Telephone Encounter (Signed)
See below

## 2019-01-28 NOTE — Telephone Encounter (Signed)
Any further recommendations while she awaits referral?

## 2019-01-29 ENCOUNTER — Other Ambulatory Visit: Payer: Self-pay

## 2019-01-29 ENCOUNTER — Ambulatory Visit (INDEPENDENT_AMBULATORY_CARE_PROVIDER_SITE_OTHER): Payer: BC Managed Care – PPO | Admitting: Family Medicine

## 2019-01-29 ENCOUNTER — Encounter: Payer: Self-pay | Admitting: Family Medicine

## 2019-01-29 VITALS — BP 120/82 | HR 73 | Ht 69.0 in | Wt 251.6 lb

## 2019-01-29 DIAGNOSIS — M5416 Radiculopathy, lumbar region: Secondary | ICD-10-CM

## 2019-01-29 MED ORDER — TRAMADOL HCL 50 MG PO TABS: 50.0000 mg | ORAL_TABLET | Freq: Four times a day (QID) | ORAL | 0 refills | Status: DC | PRN

## 2019-01-29 MED ORDER — GABAPENTIN 300 MG PO CAPS: ORAL_CAPSULE | ORAL | 3 refills | Status: DC

## 2019-01-29 MED ORDER — PREDNISONE 50 MG PO TABS: 50.0000 mg | ORAL_TABLET | Freq: Every day | ORAL | 0 refills | Status: DC

## 2019-01-29 NOTE — Patient Instructions (Addendum)
Thank you for coming in today.  Try the higher dose burst of prednisone.  Try the higher 300mg  dose of gabapnetin. OK to to max dose of 3x 300mg  pills 3x daily.  Use tramadol for severe pain.  I will work on getting MRI for injection authorized.   Please send FMLA paperwork to me.   We're moving!  Dr. Clovis Riley new office will be located at [address] on the 1st floor.  This location is across the street from the [employer] and in the same complex as the [facility] and [employer]  Our new office phone number will be [phone].  We anticipate beginning to see patients at the [facility] office in early December 2020.  Come back or go to the emergency room if you notice new weakness new numbness problems walking or bowel or bladder problems.

## 2019-01-29 NOTE — Progress Notes (Signed)
Subjective:    I'm seeing this patient as a consultation for:  Dr. Yong Channel.  Note be routed back to referring provider.  CC: Low back pain  I, Molly Weber, LAT, ATC, am serving as scribe for Dr. [name].  HPI: Pt is a 48 y/o female presenting w/ low back pain that began on 01/19/19 while out doing holiday shopping.  She reports prior 10/10 low back pain and notes radiating pain into her L LE that goes into her L calf.  She also notes numbness/tingling into the L LE that is mainly in her L 4th and 5th toes.  Aggravating factors include sitting, weight bearing on L LE or standing in an erect,neutral posture and pain improves in a sidelying position.  She has tried a combination of medications including Tramadol, Norco, Flexeril, Meloxicam and a steroid dose pack.  She has also tried heat.  She was seen at the [facility] on 01/24/19 and by her PCP on 01/25/19.  She had a L-spine XR on 01/25/19.  Since her visit w/ her PCP on 01/25/19, pt reports that her low back and L LE pain is an 8/10.  She describes her pain in her L lower back and radiating into the L LE as sharp.     Patient rates her pain currently as severe.  She is unable to work currently because of the severity of her pain.  Her job requires sitting and standing and walking.  She cannot do any of these without severe pain.  Fortunately she denies severe weakness or significant bowel or bladder dysfunction.  Past medical history, Surgical history, Family history not pertinant except as noted below, Social history, Allergies, and medications have been entered into the medical record, reviewed, and no changes needed.   Review of Systems: No headache, visual changes, nausea, vomiting, diarrhea, constipation, dizziness, abdominal pain, skin rash, fevers, chills, night sweats, weight loss, swollen lymph nodes, body aches, joint swelling, muscle aches, chest pain, shortness of breath, mood changes, visual or auditory hallucinations.    Objective:    Vitals:   01/29/19 0917  BP: 120/82  Pulse: 73  SpO2: 96%   General: Well Developed, well nourished, and in no acute distress.  Neuro/Psych: Alert and oriented x3, extra-ocular muscles intact, able to move all 4 extremities, sensation grossly intact. Skin: Warm and dry, no rashes noted.  Respiratory: Not using accessory muscles, speaking in full sentences, trachea midline.  Cardiovascular: Pulses palpable, no extremity edema. Abdomen: Does not appear distended. MSK:  L-spine: Nontender to spinal midline.  Tender palpation left lumbar paraspinal musculature. Range of motion limited rotation lateral flexion and extension.  Severely limited flexion. Lower extremity sensation is intact throughout bilateral extremities. Lower extremity reflexes are diminished bilateral knees and present and normal bilateral ankles. Strength intact to hip flexion abduction adduction bilaterally. Intact to knee extension bilaterally.  Intact knee flexion bilaterally.  Intact normal foot dorsiflexion plantarflexion bilaterally. Significantly positive left-sided slump test. Negative right. Antalgic gait present.  Lab and Radiology Results No results found for this or any previous visit (from the past 72 hour(s)). Dg Lumbar Spine Complete  Result Date: 01/26/2019 CLINICAL DATA:  Severe left low back pain EXAM: LUMBAR SPINE - COMPLETE 4+ VIEW COMPARISON:  None. FINDINGS: No fracture or malalignment. Degenerative disc disease is most prominent at L5-S1 and T11-T12. Lower lumbar facet degenerative changes are noted. IMPRESSION: Degenerative changes as above. Electronically Signed   By: Dorise Bullion III M.D   On:  01/26/2019 02:35  I, [name], personally (independently) visualized and performed the interpretation of the images attached in this note.   Impression and Recommendations:    Assessment and Plan: 48 y.o. female with significant low back pain with radicular symptoms left leg  corresponding to S1 nerve root.  Pain is progressing and worsening and now interfering with her ability to work.  She is failing initial conservative management including trial of prednisone, and analgesics.  Plan to progress rapidly.  We will do another burst of prednisone, increase her current gabapentin dose, and prescribed tramadol again.  Additionally refer to physical therapy.  However I am very concerned about her radicular symptoms that are worsening.  We will proceed with MRI for further evaluation and to plan for epidural steroid or potentially even surgery. Recheck following MRI.  Precautions reviewed.  Work note provided.Marland Kitchen  PDMP reviewed during this encounter. Orders Placed This Encounter  Procedures  . MR Lumbar Spine Wo Contrast    Standing Status:   Future    Standing Expiration Date:   03/31/2020    Order Specific Question:   What is the patient's sedation requirement?    Answer:   No Sedation    Order Specific Question:   Does the patient have a pacemaker or implanted devices?    Answer:   No    Order Specific Question:   Preferred imaging location?    Answer:   [facility] (table limit-550lbs)    Order Specific Question:   Radiology Contrast Protocol - do NOT remove file path    Answer:   [email or web address]  . Ambulatory referral to Physical Therapy    Referral Priority:   Routine    Referral Type:   Physical Medicine    Referral Reason:   Specialty Services Required    Requested Specialty:   Physical Therapy   Meds ordered this encounter  Medications  . gabapentin (NEURONTIN) 300 MG capsule    Sig: One tab PO qHS for a week, then BID for a week, then TID. May double weekly to a max of 3,600mg /day    Dispense:  180 capsule    Refill:  3  . predniSONE (DELTASONE) 50 MG tablet    Sig: Take 1 tablet (50 mg total) by mouth daily.    Dispense:  5 tablet    Refill:  0  . traMADol (ULTRAM) 50 MG tablet    Sig: Take 1-2 tablets (50-100 mg  total) by mouth every 6 (six) hours as needed.    Dispense:  25 tablet    Refill:  0    Discussed warning signs or symptoms. Please see discharge instructions. Patient expresses understanding.  The above documentation has been reviewed and is accurate and complete [name]

## 2019-01-31 ENCOUNTER — Encounter: Payer: Self-pay | Admitting: Physical Therapy

## 2019-01-31 ENCOUNTER — Other Ambulatory Visit: Payer: Self-pay

## 2019-01-31 ENCOUNTER — Ambulatory Visit: Payer: BC Managed Care – PPO | Attending: Family Medicine | Admitting: Physical Therapy

## 2019-01-31 DIAGNOSIS — M545 Low back pain, unspecified: Secondary | ICD-10-CM

## 2019-01-31 DIAGNOSIS — M79605 Pain in left leg: Secondary | ICD-10-CM

## 2019-01-31 DIAGNOSIS — M6281 Muscle weakness (generalized): Secondary | ICD-10-CM | POA: Diagnosis not present

## 2019-01-31 NOTE — Patient Instructions (Signed)
Access Code: [email or web address]  URL: [email or web address]  Date: 01/31/2019  Prepared by: Hilda Blades   Exercises Supine Piriformis Stretch with Foot on Ground - 20-30 seconds hold Supine Figure 4 Piriformis Stretch - 20-30 seconds hold Hooklying Single Knee to Chest Stretch - 20-30 seconds hold Standing Lumbar Extension with Counter - 10 reps Standing Lumbar Extension at Wall - Forearms Prone Press Up on Elbows Seated Lumbar Extension Seated Lumbar Flexion Stretch - 10 reps - PRN Supine March - 10 reps Supine Lower Trunk Rotation - 10 reps Seated Sciatic Tensioner - 10 reps

## 2019-01-31 NOTE — Therapy (Signed)
[place], [place], [address] Phone: [phone]   Fax:  [phone]  Physical Therapy Evaluation  Patient Details  Name: Tonya Suarez MRN: [id number] Date of Birth: 08-May-1970 Referring Provider (PT): Gregor Hams, MD   Encounter Date: 01/31/2019  PT End of Session - 01/31/19 1353    Visit Number  1    Number of Visits  12    Date for PT Re-Evaluation  03/14/19    Authorization Type  BCBS    PT Start Time  1355    PT Stop Time  1440    PT Time Calculation (min)  45 min    Activity Tolerance  Patient tolerated treatment well    Behavior During Therapy  [facility] for tasks assessed/performed       Past Medical History:  Diagnosis Date  . Allergy   . Arthritis   . Chronic headaches   . Depression   . GERD (gastroesophageal reflux disease)   . Hypertension   . Low back pain   . UTI (urinary tract infection)     Past Surgical History:  Procedure Laterality Date  . TUBAL LIGATION      There were no vitals filed for this visit.   Subjective Assessment - 01/31/19 1359    Subjective  Patient reports she has had back problems before but this time it is worse. Nothing specific caused the pain, there was no mechanism. The pain radiates from the lower back to the left hip, thigh, calf. Sitting or staying in one position for extended periods, and bending forward brings on the pain. Rubbing the area with a topical cream and medications help with the pain.    Limitations  Sitting;Standing;Walking;Lifting;House hold activities    How long can you sit comfortably?  2-3 minutes    How long can you stand comfortably?  30 minutes    How long can you walk comfortably?  30 minutes    Diagnostic tests  X-ray, MRI scheduled for 02/18/2019    Patient Stated Goals  Patient wants to reduce pain so she can return to work    Currently in Pain?  Yes    Pain Score  8    worst: 10/10   Pain Location  Back    Pain Orientation  Lower;Left    Pain Descriptors / Indicators  Sharp;Dull    Pain Type  Acute pain    Pain Radiating Towards  starts in tailbone region then radiates down the back/side of the leg to the calf, occasional N/T foot    Pain Onset  1 to 4 weeks ago    Pain Frequency  Constant    Aggravating Factors   Sitting, staying too long in one spot, bending forward    Pain Relieving Factors  Medication, topical cream    Effect of Pain on Daily Activities  Patient is not able to work.         [facility] PT Assessment - 01/31/19 0001      Assessment   Medical Diagnosis  Lumbar radiculopathy, left sided    Referring Provider (PT)  Gregor Hams, MD    Onset Date/Surgical Date  01/22/19    Hand Dominance  Right    Next MD Visit  Not scheduled    Prior Therapy  None      Precautions   Precautions  None      Restrictions   Weight Bearing Restrictions  No  Balance Screen   Has the patient fallen in the past 6 months  No    Has the patient had a decrease in activity level because of a fear of falling?   No    Is the patient reluctant to leave their home because of a fear of falling?   No      Home Film/video editor residence    Living Arrangements  Spouse/significant other;Children      Prior Function   Level of Suarez with basic ADLs    Vocation  Full time employment   patient is currently out of work   Vocation Requirements  Patient reports she does a combination of sitting, lifting, moving around      Cognition   Overall Cognitive Status  Within Functional Limits for tasks assessed      Observation/Other Assessments   Observations  Patient appears in apparent distress with guarded movement    Focus on Therapeutic Outcomes (FOTO)   66% limitation      Sensation   Light Touch  Appears Intact      Posture/Postural Control   Posture Comments  Patient exhibits an initial weight shift toward right, she has difficulty with initial  standing to attain upright position, increased lumbar lordosis      ROM / Strength   AROM / PROM / Strength  AROM;Strength;PROM      AROM   AROM Assessment Site  Lumbar    Lumbar Flexion  25% (fingertips to knees) - increased left hip pain    Lumbar Extension  75% - no leg pain, after multiple reps increased midline low back pain    Lumbar - Right Side Bend  100% - fingertip to knee jointline    Lumbar - Left Side Bend  100% - fingertip to knee jointline    Lumbar - Right Rotation  75%    Lumbar - Left Rotation  50% - left low back pain      PROM   Overall PROM Comments  Hip PROM grossly WFL bilaterally      Strength   Overall Strength Comments  Hip strength assess in seated and supine position    Strength Assessment Site  Hip;Ankle;Knee    Right/Left Hip  Left;Right    Right Hip Flexion  4/5    Right Hip Extension  4-/5    Right Hip ABduction  4-/5    Left Hip Flexion  4-/5    Left Hip Extension  3+/5    Left Hip ABduction  3+/5    Right/Left Knee  Right;Left    Right Knee Flexion  5/5    Right Knee Extension  5/5    Left Knee Flexion  5/5    Left Knee Extension  5/5   limited knee extension range due to nerve pain   Right/Left Ankle  Right;Left    Right Ankle Dorsiflexion  5/5    Right Ankle Eversion  5/5    Left Ankle Dorsiflexion  5/5    Left Ankle Eversion  5/5      Flexibility   Soft Tissue Assessment /Muscle Length  yes    Hamstrings  Limited - left sided pain with right SLR to 80, left side pain with left SLR to 60      Palpation   Spinal mobility  Hypomobile lumbar with left hip pain reported on L4-5 CPAs    Palpation comment  TTP to left lumbar paraspinals, gluteal region  Special Tests   Other special tests  Slump positive on left, SLR positive on left      Transfers   Transfers  Independent with all Transfers   increased time and difficulty     Ambulation/Gait   Ambulation/Gait  Yes    Ambulation/Gait Assistance  7: Independent    Gait Comments   Patient exhibits antalgic gait on left                Objective measurements completed on examination: See above findings.      [facility] Adult PT Treatment/Exercise - 01/31/19 0001      Exercises   Exercises  Lumbar      Lumbar Exercises: Stretches   Single Knee to Chest Stretch  30 seconds    Lower Trunk Rotation  5 reps    Press Ups  10 reps    Press Ups Limitations  on elbows    Piriformis Stretch  30 seconds    Figure 4 Stretch  30 seconds    Other Lumbar Stretch Exercise  Standing lumbar extension over table and with forearms on wall    Other Lumbar Stretch Exercise  Seated lumbar flexion stretch just to demonstrate in case she wants to use this for pain modulation      Lumbar Exercises: Supine   Other Supine Lumbar Exercises  Alternating marching with core activation x10             PT Education - 01/31/19 1352    Education Details  Exam findings, POC, HEP    Person(s) Educated  Patient    Methods  Explanation;Demonstration;Verbal cues;Handout    Comprehension  Verbalized understanding;Returned demonstration;Verbal cues required;Need further instruction;Tactile cues required       PT Short Term Goals - 01/31/19 1537      PT SHORT TERM GOAL #1   Title  Patient will be I i HEP in order to maintain progress in PT.    Time  3    Period  Weeks    Status  New    Target Date  02/21/19      PT SHORT TERM GOAL #2   Title  Patient will report pain level < or = 4-5/10 at rest to allow for improved functional level    Baseline  7/10    Time  3    Period  Weeks    Status  New    Target Date  02/21/19      PT SHORT TERM GOAL #3   Title  Patient will be exhibit 25% improved lumbar flexion in order to allow for ability to return to work.    Time  3    Period  Weeks    Status  New    Target Date  02/21/19        PT Long Term Goals - 01/31/19 1543      PT LONG TERM GOAL #1   Title  Patient will exhibit improved core and glut strength to > or = 4/5 in  order to improve control with work related tasks.    Time  6    Period  Weeks    Status  New    Target Date  03/14/19      PT LONG TERM GOAL #2   Title  Patient will report pain level < or = 1-2/10 during activity in order to allow return to work with no limitation    Time  8    Period  Weeks  Status  New    Target Date  03/28/19      PT LONG TERM GOAL #3   Title  Patient will exhibit lumar AROM grossly WFL in order to improve movement and work ability.    Time  8    Period  Weeks    Status  New    Target Date  03/28/19      PT LONG TERM GOAL #4   Title  Patient will exhibit improved functional level by < or = 36% limitation FOTO    Time  8    Period  Weeks    Status  New    Target Date  03/28/19             Plan - 01/31/19 1524    Clinical Impression Statement  Patient presents to PT with report of acute on chronic low back pain with newly onset left posterior leg symptoms. She does seem to have radicular pain with no N/T or neurological weakness. Upon exam she did exhibit a lateral shift to right and she seemed to respond well to extension based directional preference that corrected the shift, however she said this changes throughout the day. She was provided with extension based exercises and gentle hip stretches and core activation exercises to help improve her symptoms. She would benefit from continued skilled PT to improve her back and left leg pain so she can return to work without limitation.    Personal Factors and Comorbidities  Past/Current Experience;Time since onset of injury/illness/exacerbation;Comorbidity 3+;Profession    Comorbidities  HTN, hx of depression, current smoker,    Examination-Activity Limitations  Locomotion Level;Transfers;Bed Mobility;Bend;Sit;Carry;Sleep;Squat;Dressing;Stand;Lift    Examination-Participation Restrictions  Cleaning;Meal Prep;Community Activity;Driving;Laundry;Yard Work    Merchant [employer] officer  Evolving/Moderate  complexity    Clinical Decision Making  Moderate    Rehab Potential  Good    PT Frequency  2x / week    PT Duration  6 weeks    PT Treatment/Interventions  ADLs/Self Care Home Management;Cryotherapy;Electrical Stimulation;Moist Heat;Traction;Ultrasound;Neuromuscular re-education;Therapeutic exercise;Therapeutic activities;Functional mobility training;Gait training;Stair training;Patient/family education;Manual techniques;Dry needling;Passive range of motion;Taping;Spinal Manipulations;Joint Manipulations    PT Next Visit Plan  Assess HEP and response to extension based exercises, modalities and manual as needed for pain management and guarding, progress light core activiton and motion    PT Home Exercise Plan  Access Code: [email or web address]: Extension based exercises consisting of prone press up on elbows, seated or standing extension, seated lumbar flexion PRN, supine SKTC, piriformis stretch, supine marching and LTR    Consulted and Agree with Plan of Care  Patient       Patient will benefit from skilled therapeutic intervention in order to improve the following deficits and impairments:  Abnormal gait, Decreased range of motion, Decreased activity tolerance, Pain, Postural dysfunction, Decreased strength  Visit Diagnosis: Acute midline low back pain, unspecified whether sciatica present  Pain in left leg  Muscle weakness (generalized)     Problem List Patient Active Problem List   Diagnosis Date Noted  . Current smoker 07/19/2018  . Constipation 07/19/2018  . B12 deficiency 07/19/2018  . Restless legs 07/19/2018  . Ingrown toenail 12/20/2016  . History of adenomatous polyp of colon 09/23/2016  . Migraines 05/06/2016  . Family history of cancer of GI tract 12/06/2012  . HTN (hypertension) 11/21/2011  . MENORRHAGIA 09/23/2009  . PLANTAR FASCIITIS 09/20/2007  . Osteoarthritis 09/19/2006  . Depression 09/06/2006  . Allergic rhinitis 09/06/2006  . GERD 09/06/2006    Hilda Blades,  PT, DPT, LAT, ATC 01/31/19  4:04 PM Phone: [phone] Fax: [facility] [address] [place], [place], [address] Phone: [phone]   Fax:  [phone]  Name: Tonya Suarez MRN: [id number] Date of Birth: February 12, 1971

## 2019-02-01 ENCOUNTER — Telehealth: Payer: Self-pay | Admitting: Family Medicine

## 2019-02-01 NOTE — Telephone Encounter (Signed)
See note  Copied from [place] [phone]. Topic: General - Other >> Feb 01, 2019 12:37 PM Leward Quan A wrote: Reason for CRM: Patient would like a call back to confirm if Dr Tommi Rumps received FMLA paperwork to be completed and returned. Please call Ph#  [phone]

## 2019-02-04 NOTE — Telephone Encounter (Signed)
Fax number received for Tonya Suarez and fax number is [phone].  FMLA paperwork has been faxed as instructed.

## 2019-02-04 NOTE — Telephone Encounter (Signed)
Called pt and informed her that we have received her FMLA paperwork and that Dr. Georgina Snell has completed it.  Pt will call back at [facility] line to provide [place] w/ fax number where we need to send her FMLA paperwork.

## 2019-02-05 ENCOUNTER — Other Ambulatory Visit: Payer: Self-pay | Admitting: Obstetrics and Gynecology

## 2019-02-12 ENCOUNTER — Other Ambulatory Visit: Payer: Self-pay

## 2019-02-12 ENCOUNTER — Ambulatory Visit: Payer: BC Managed Care – PPO | Admitting: Physical Therapy

## 2019-02-12 DIAGNOSIS — M79605 Pain in left leg: Secondary | ICD-10-CM

## 2019-02-12 DIAGNOSIS — M6281 Muscle weakness (generalized): Secondary | ICD-10-CM | POA: Diagnosis not present

## 2019-02-12 DIAGNOSIS — M545 Low back pain, unspecified: Secondary | ICD-10-CM

## 2019-02-12 NOTE — Patient Instructions (Signed)
Hamstring Step 1    Straighten left knee. Keep knee level with other knee or on bolster. Hold _20-30__ seconds. Relax knee by returning foot to start. Repeat __3_ times.  Copyright  VHI. All rights reserved.

## 2019-02-12 NOTE — Therapy (Signed)
[place], [place], [address] Phone: [phone]   Fax:  [phone]  Physical Therapy Treatment  Patient Details  Name: Tonya Suarez MRN: [id number] Date of Birth: 07-May-1970 Referring Provider (PT): Gregor Hams, MD   Encounter Date: 02/12/2019  PT End of Session - 02/12/19 0905    Visit Number  2    Number of Visits  12    Date for PT Re-Evaluation  03/14/19    Authorization Type  BCBS    PT Start Time  [id number]    PT Stop Time  0930    PT Time Calculation (min)  43 min       Past Medical History:  Diagnosis Date  . Allergy   . Arthritis   . Chronic headaches   . Depression   . GERD (gastroesophageal reflux disease)   . Hypertension   . Low back pain   . UTI (urinary tract infection)     Past Surgical History:  Procedure Laterality Date  . TUBAL LIGATION      There were no vitals filed for this visit.  Subjective Assessment - 02/12/19 0906    Subjective  Patient reports improvement in ROM. Pain is  less intense and located Left lumbar and into left buttock and upper hamstring.    Currently in Pain?  Yes    Pain Score  4     Pain Location  Back                       [facility] Adult PT Treatment/Exercise - 02/12/19 0001      Lumbar Exercises: Stretches   Active Hamstring Stretch  3 reps;30 seconds    Active Hamstring Stretch Limitations  supine with strap    Single Knee to Chest Stretch  30 seconds    Single Knee to Chest Stretch Limitations  x 3 each    Lower Trunk Rotation  5 reps    Standing Extension  5 reps;10 seconds    Press Ups  10 reps    Press Ups Limitations  on elbows    Piriformis Stretch  30 seconds    Piriformis Stretch Limitations  x3    Figure 4 Stretch  30 seconds    Figure 4 Stretch Limitations  x3      Lumbar Exercises: Supine   Other Supine Lumbar Exercises  Alternating marching with core activation x10    Other Supine Lumbar Exercises   SLR x 10 x 2 with abdominal draw in       Lumbar Exercises: Prone   Straight Leg Raise  10 reps    Straight Leg Raises Limitations  cues for abdominal draw in      Manual Therapy   Manual therapy comments  massage roller to left posterior lateral hip and thigh, STW to let lumbar paraspinals             PT Education - 02/12/19 1027    Education Details  HEP    Person(s) Educated  Patient    Methods  Explanation;Handout    Comprehension  Verbalized understanding       PT Short Term Goals - 01/31/19 1537      PT SHORT TERM GOAL #1   Title  Patient will be I i HEP in order to maintain progress in PT.    Time  3    Period  Weeks    Status  New  Target Date  02/21/19      PT SHORT TERM GOAL #2   Title  Patient will report pain level < or = 4-5/10 at rest to allow for improved functional level    Baseline  7/10    Time  3    Period  Weeks    Status  New    Target Date  02/21/19      PT SHORT TERM GOAL #3   Title  Patient will be exhibit 25% improved lumbar flexion in order to allow for ability to return to work.    Time  3    Period  Weeks    Status  New    Target Date  02/21/19        PT Long Term Goals - 01/31/19 1543      PT LONG TERM GOAL #1   Title  Patient will exhibit improved core and glut strength to > or = 4/5 in order to improve control with work related tasks.    Time  6    Period  Weeks    Status  New    Target Date  03/14/19      PT LONG TERM GOAL #2   Title  Patient will report pain level < or = 1-2/10 during activity in order to allow return to work with no limitation    Time  8    Period  Weeks    Status  New    Target Date  03/28/19      PT LONG TERM GOAL #3   Title  Patient will exhibit lumar AROM grossly WFL in order to improve movement and work ability.    Time  8    Period  Weeks    Status  New    Target Date  03/28/19      PT LONG TERM GOAL #4   Title  Patient will exhibit improved functional level by < or = 36% limitation  FOTO    Time  8    Period  Weeks    Status  New    Target Date  03/28/19            Plan - 02/12/19 0908    Clinical Impression Statement  Less intensity of pain today and she reports improved ROM. Continued with review of HEP and progression of extension biased therex and core strength. Session tolerated well without c/o increased pain. [place]aul soft tissue work performed to left lumbar and gluteals. Afterward pt reported no increased pain.    PT Next Visit Plan  Assess HEP and response to extension based exercises, modalities and manual as needed for pain management and guarding, progress light core activiton and motion    PT Home Exercise Plan  Access Code: [email or web address] (incorrect code): Extension based exercises consisting of prone press up on elbows, seated or standing extension, seated lumbar flexion PRN, supine SKTC, piriformis stretch, supine marching and LTR added hamstring stretch with strap       Patient will benefit from skilled therapeutic intervention in order to improve the following deficits and impairments:  Abnormal gait, Decreased range of motion, Decreased activity tolerance, Pain, Postural dysfunction, Decreased strength  Visit Diagnosis: Acute midline low back pain, unspecified whether sciatica present  Pain in left leg  Muscle weakness (generalized)     Problem List Patient Active Problem List   Diagnosis Date Noted  . Current smoker 07/19/2018  . Constipation 07/19/2018  . B12 deficiency 07/19/2018  . Restless  legs 07/19/2018  . Ingrown toenail 12/20/2016  . History of adenomatous polyp of colon 09/23/2016  . Migraines 05/06/2016  . Family history of cancer of GI tract 12/06/2012  . HTN (hypertension) 11/21/2011  . MENORRHAGIA 09/23/2009  . PLANTAR FASCIITIS 09/20/2007  . Osteoarthritis 09/19/2006  . Depression 09/06/2006  . Allergic rhinitis 09/06/2006  . GERD 09/06/2006    Dorene Ar, PTA 02/12/2019, 10:34 AM  [place], [place], [address] Phone: [phone]   Fax:  [phone]  Name: Tonya Suarez MRN: [id number] Date of Birth: 1970/10/17

## 2019-02-18 ENCOUNTER — Telehealth: Payer: Self-pay | Admitting: *Deleted

## 2019-02-18 ENCOUNTER — Other Ambulatory Visit: Payer: Self-pay

## 2019-02-18 ENCOUNTER — Ambulatory Visit
Admission: RE | Admit: 2019-02-18 | Discharge: 2019-02-18 | Disposition: A | Discharge status: Home / Self Care | Payer: BC Managed Care – PPO | Source: Ambulatory Visit | Attending: Family Medicine | Admitting: Family Medicine

## 2019-02-18 ENCOUNTER — Ambulatory Visit: Payer: BC Managed Care – PPO | Admitting: Physical Therapy

## 2019-02-18 ENCOUNTER — Encounter: Payer: Self-pay | Admitting: Family Medicine

## 2019-02-18 DIAGNOSIS — M48061 Spinal stenosis, lumbar region without neurogenic claudication: Secondary | ICD-10-CM | POA: Diagnosis not present

## 2019-02-18 DIAGNOSIS — M5416 Radiculopathy, lumbar region: Secondary | ICD-10-CM

## 2019-02-18 NOTE — Telephone Encounter (Signed)
Work note written sent electronically through [employer].

## 2019-02-18 NOTE — Telephone Encounter (Signed)
Pt called stating that she is scheduled for an MRI today and Dr. Georgina Snell previously wrote her a note putting her out of work until tomorrow 12/28. Pt would like an extension on the note for work until Dr. Georgina Snell received the MRI results.

## 2019-02-19 NOTE — Progress Notes (Signed)
MRI lumbar spine shows bulging disc pressing on the left S1 nerve root causing left leg pain.  There are other findings as well causing some other nerves to possibly be pinched.  Recommend recheck with me in the near future to discuss results in further detail.  Next step is epidural steroid injection.  Would you like me to order that now?

## 2019-02-20 ENCOUNTER — Ambulatory Visit: Payer: BC Managed Care – PPO | Attending: Internal Medicine

## 2019-02-20 ENCOUNTER — Telehealth: Payer: Self-pay | Admitting: Family Medicine

## 2019-02-20 ENCOUNTER — Ambulatory Visit: Payer: BC Managed Care – PPO | Admitting: Physical Therapy

## 2019-02-20 DIAGNOSIS — Z20828 Contact with and (suspected) exposure to other viral communicable diseases: Secondary | ICD-10-CM | POA: Diagnosis not present

## 2019-02-20 DIAGNOSIS — Z20822 Contact with and (suspected) exposure to covid-19: Secondary | ICD-10-CM

## 2019-02-20 DIAGNOSIS — M5417 Radiculopathy, lumbosacral region: Secondary | ICD-10-CM

## 2019-02-20 NOTE — Telephone Encounter (Signed)
Pt has been scheduled for epidural on 02/26/19 at [facility].

## 2019-02-20 NOTE — Telephone Encounter (Signed)
Epidural steroid injection ordered.  Please contact or route to [facility]: [facility] on [address] to contact and schedule patient.

## 2019-02-20 NOTE — Telephone Encounter (Signed)
-----   Message from [name], LAT sent at 02/19/2019  9:33 AM EST ----- Called pt who has seen L-spine MRI results through [facility].  Pt states that she would like to proceed w/ an L-spine epidural.

## 2019-02-21 LAB — NOVEL CORONAVIRUS, NAA: SARS-CoV-2, NAA: DETECTED — AB

## 2019-02-25 ENCOUNTER — Other Ambulatory Visit: Payer: Self-pay | Admitting: Internal Medicine

## 2019-02-25 ENCOUNTER — Encounter: Payer: Self-pay | Admitting: Family Medicine

## 2019-02-25 ENCOUNTER — Ambulatory Visit: Payer: BC Managed Care – PPO | Admitting: Physical Therapy

## 2019-02-25 DIAGNOSIS — K219 Gastro-esophageal reflux disease without esophagitis: Secondary | ICD-10-CM

## 2019-02-26 ENCOUNTER — Inpatient Hospital Stay: Admission: RE | Admit: 2019-02-26 | Payer: BC Managed Care – PPO | Source: Ambulatory Visit

## 2019-02-28 ENCOUNTER — Ambulatory Visit: Payer: BC Managed Care – PPO | Admitting: Physical Therapy

## 2019-03-01 ENCOUNTER — Encounter: Payer: Self-pay | Admitting: Family Medicine

## 2019-03-01 ENCOUNTER — Ambulatory Visit (INDEPENDENT_AMBULATORY_CARE_PROVIDER_SITE_OTHER): Payer: BC Managed Care – PPO | Admitting: Family Medicine

## 2019-03-01 ENCOUNTER — Telehealth: Payer: Self-pay | Admitting: *Deleted

## 2019-03-01 VITALS — BP 113/83 | HR 68 | Temp 96.7°F | Ht 69.0 in | Wt 251.0 lb

## 2019-03-01 DIAGNOSIS — M5416 Radiculopathy, lumbar region: Secondary | ICD-10-CM

## 2019-03-01 DIAGNOSIS — U071 COVID-19: Secondary | ICD-10-CM | POA: Diagnosis not present

## 2019-03-01 DIAGNOSIS — I10 Essential (primary) hypertension: Secondary | ICD-10-CM | POA: Diagnosis not present

## 2019-03-01 NOTE — Progress Notes (Signed)
Phone [phone] Virtual visit via Video note   Subjective:  Chief complaint: Chief Complaint  Patient presents with  . Follow-up    on covid test Pos 02/20/2019   This visit type was conducted due to national recommendations for restrictions regarding the COVID-19 Pandemic (e.g. social distancing).  This format is felt to be most appropriate for this patient at this time balancing risks to patient and risks to population by having him in for in person visit.  No physical exam was performed (except for noted visual exam or audio findings with Telehealth visits).    Our team/I connected with [name] at  1:00 PM EST by a video enabled telemedicine application (doxy.me or caregility through epic) and verified that I am speaking with the correct person using two identifiers.  Location patient: [place] Location provider: [facility], office Persons participating in the virtual visit:  patient  Our team/I discussed the limitations of evaluation and management by telemedicine and the availability of in person appointments. In light of current covid-19 pandemic, patient also understands that we are trying to protect them by minimizing in office contact if at all possible.  The patient expressed consent for telemedicine visit and agreed to proceed. Patient understands insurance will be billed.   ROS-cough has significantly improved.  No shortness of breath reported.  No fevers.  Still has loss of taste and smell  Past Medical History-  Patient Active Problem List   Diagnosis Date Noted  . Current smoker 07/19/2018    Priority: High  . Constipation 07/19/2018    Priority: Medium  . History of adenomatous polyp of colon 09/23/2016    Priority: Medium  . Migraines 05/06/2016    Priority: Medium  . HTN (hypertension) 11/21/2011    Priority: Medium  . Depression 09/06/2006    Priority: Medium  . Restless legs 07/19/2018    Priority: Low  . Ingrown toenail 12/20/2016   Priority: Low  . Family history of cancer of GI tract 12/06/2012    Priority: Low  . MENORRHAGIA 09/23/2009    Priority: Low  . PLANTAR FASCIITIS 09/20/2007    Priority: Low  . Osteoarthritis 09/19/2006    Priority: Low  . Allergic rhinitis 09/06/2006    Priority: Low  . GERD 09/06/2006    Priority: Low  . B12 deficiency 07/19/2018    Medications- reviewed and updated Current Outpatient Medications  Medication Sig Dispense Refill  . amLODipine (NORVASC) 2.5 MG tablet TAKE 1 TABLET BY MOUTH EVERY DAY 90 tablet 1  . azelastine (ASTELIN) 0.1 % nasal spray Place 2 sprays into both nostrils 2 (two) times daily. 30 mL 12  . cyclobenzaprine (FLEXERIL) 5 MG tablet Take 1 tablet (5 mg total) by mouth 3 (three) times daily as needed for muscle spasms. 30 tablet 1  . fluticasone (FLONASE) 50 MCG/ACT nasal spray Place 2 sprays into both nostrils daily. 16 g 6  . gabapentin (NEURONTIN) 300 MG capsule One tab PO qHS for a week, then BID for a week, then TID. May double weekly to a max of 3,'600mg'$ /day 180 capsule 3  . hydrochlorothiazide (HYDRODIURIL) 25 MG tablet TAKE 1 TABLET BY MOUTH EVERY DAY 90 tablet 1  . HYDROcodone-acetaminophen (NORCO/VICODIN) 5-325 MG tablet Take 1 tablet by mouth every 6 (six) hours as needed for severe pain (do not drive for 8 hours after taking.). 16 tablet 0  . meloxicam (MOBIC) 7.5 MG tablet TAKE 1 TABLET (7.5 MG TOTAL) BY MOUTH DAILY AS NEEDED FOR PAIN. [place]  tablet 1  . omeprazole (PRILOSEC) 40 MG capsule TAKE 1 CAPSULE BY MOUTH EVERY DAY; patient needs office visit for further refills 90 capsule 0  . traMADol (ULTRAM) 50 MG tablet Take 1-2 tablets (50-100 mg total) by mouth every 6 (six) hours as needed. 25 tablet 0   No current facility-administered medications for this visit.     Objective:  BP 113/83   Pulse 68   Temp (!) 96.7 F (35.9 C) (Oral)   Ht _0  (1.753 m)   Wt 251 lb (113.9 kg)   LMP 06/05/2018   BMI 37.07 kg/m  self reported vitals Gen: NAD,  resting comfortably Lungs: nonlabored, normal respiratory rate  Skin: appears dry, no obvious rash     Assessment and Plan   #COVID-19 #Bulging disc L5-S1 with lumbar radiculopathy S:Patient developed first symptom December 26.  She reports No taste, no smell, sinus congestion and headaches.  She also had some runny nose.  Never had fever. Had dry cough- now 90% better.   Was out of work for back from 1st of December- epidural injections pushed back to the 15th due to COVID-19.   A/P: Patient with COVID-19 but has significantly improved.  Patient has met COVID-19 quarantine guidelines at this point and may return to work at her next shift on Monday.  With that being said patient has continued back pain and is being followed by sports medicine.  She has an epidural injection on the 15th-it may be reasonable to do for her return to work until at least the 18th but she is going to discuss with Dr. Georgina Snell of sports medicine.  #hypertension S: compliant with amlodipine 2.5 mg, hydrochlorothiazide 25 mg BP Readings from Last 3 Encounters:  03/01/19 113/83  01/29/19 120/82  01/25/19 124/90  A/P: Excellent control-continue current medications   Recommended follow up: As needed for acute concern Future Appointments  Date Time Provider [place]  03/08/2019  1:30 PM [facility] C-ARM RM 3 GI-315[facility]. [facility]  09/25/2019  8:00 AM Salvadore Dom, MD [place] None    Lab/Order associations:   ICD-10-CM   1. COVID-19  U07.1   2. Lumbar radiculopathy  M54.16    Return precautions advised.  Garret Reddish, MD

## 2019-03-01 NOTE — Telephone Encounter (Signed)
Pt stated she was previously seen by Dr. Georgina Snell and he wrote a letter to put her out of work until she could receive an epidural. Pt stated she was diagnosed with Covid and had to push her epidural out until 03/08/2019. Pt is requesting another letter extending her time out of work until after the injection.

## 2019-03-01 NOTE — Patient Instructions (Signed)
There are no preventive care reminders to display for this patient.  Depression screen [facility] 2/9 01/25/2019 07/19/2018 04/10/2018  Decreased Interest 0 0 0  Down, Depressed, Hopeless 0 0 0  PHQ - 2 Score 0 0 0  Altered sleeping 1 2 3   Tired, decreased energy 0 0 1  Change in appetite 0 3 0  Feeling bad or failure about yourself  0 0 0  Trouble concentrating 0 0 0  Moving slowly or fidgety/restless 0 0 0  Suicidal thoughts 0 0 0  PHQ-9 Score 1 5 4   Difficult doing work/chores Not difficult at all Not difficult at all Somewhat difficult    Recommended follow up: No follow-ups on file.

## 2019-03-04 ENCOUNTER — Encounter: Payer: Self-pay | Admitting: Family Medicine

## 2019-03-04 ENCOUNTER — Ambulatory Visit: Payer: BC Managed Care – PPO | Admitting: Physical Therapy

## 2019-03-04 NOTE — Telephone Encounter (Signed)
Called Tonya Suarez and informed her that updated work letter sent through [employer].  Tonya Suarez states that she saw the updated letter and is able to print at home.  Tonya Suarez will have lumbar epidural this Friday, Mar 08, 2019.  Advise Tonya Suarez to see how she feels over the next week following her epidural and if no better to call and schedule f/u visit w/ Dr. Georgina Snell.

## 2019-03-04 NOTE — Telephone Encounter (Signed)
Letter sent electronically through [place] and printed.  We can mail or fax the physical letter if needed or patient can pick up from front desk.  Let me know what she would like to do with that.  The letter however is available in my chart now.  Ellard Artis

## 2019-03-06 ENCOUNTER — Encounter: Payer: BC Managed Care – PPO | Admitting: Physical Therapy

## 2019-03-06 ENCOUNTER — Ambulatory Visit: Payer: BC Managed Care – PPO | Attending: Internal Medicine

## 2019-03-06 DIAGNOSIS — Z20822 Contact with and (suspected) exposure to covid-19: Secondary | ICD-10-CM

## 2019-03-07 ENCOUNTER — Telehealth: Payer: Self-pay | Admitting: Family Medicine

## 2019-03-07 NOTE — Telephone Encounter (Signed)
Patient called stating that she was written out of work until 03/11/2019. The HR Department asked for a letter stating if she would be able to return to work with regular duty or if she would need light duty? (letter can be added to MyChart)  She said that she was doing better but it started hurting again on Saturday and has continued through the week. She is scheduled for an epidural injection tomorrow and is hopefull that it will help to reduce the pain. She has been limiting her heavy lifting while she has been out of work and at home.  Please advise.

## 2019-03-07 NOTE — Telephone Encounter (Signed)
Spoke to pt and she feels that returning to light duty would be best.  She states that she does a lot of repetitive bending and lifting at work and feels that she would be comfortable lifting 5# to start.

## 2019-03-07 NOTE — Telephone Encounter (Signed)
Called and LM for pt to inform us as to what type of work level she feels she'll be ready for come 03/11/19 - light duty or full duty.  Dr. Georgina Snell is happy to write a new work letter but needs to know what she thinks she'll be ready for on 03/11/19.

## 2019-03-07 NOTE — Telephone Encounter (Signed)
Please clarify with patient.  Does she think she can return with full duty or do if she thinks she needs light duty?  She likely will be feeling better after the injection.

## 2019-03-08 ENCOUNTER — Ambulatory Visit
Admission: RE | Admit: 2019-03-08 | Discharge: 2019-03-08 | Disposition: A | Discharge status: Home / Self Care | Payer: BC Managed Care – PPO | Source: Ambulatory Visit | Attending: Family Medicine | Admitting: Family Medicine

## 2019-03-08 ENCOUNTER — Other Ambulatory Visit: Payer: Self-pay

## 2019-03-08 ENCOUNTER — Encounter: Payer: Self-pay | Admitting: Family Medicine

## 2019-03-08 DIAGNOSIS — M5417 Radiculopathy, lumbosacral region: Secondary | ICD-10-CM

## 2019-03-08 DIAGNOSIS — M5117 Intervertebral disc disorders with radiculopathy, lumbosacral region: Secondary | ICD-10-CM | POA: Diagnosis not present

## 2019-03-08 LAB — NOVEL CORONAVIRUS, NAA: SARS-CoV-2, NAA: NOT DETECTED

## 2019-03-08 MED ORDER — METHYLPREDNISOLONE ACETATE 40 MG/ML INJ SUSP (RADIOLOG
120.0000 mg | Freq: Once | INTRAMUSCULAR | Status: AC
  Administered 2019-03-08: 14:00:00 120 mg via EPIDURAL

## 2019-03-08 MED ORDER — IOPAMIDOL (ISOVUE-M 200) INJECTION 41%
1.0000 mL | Freq: Once | INTRAMUSCULAR | Status: AC
  Administered 2019-03-08: 1 mL via EPIDURAL

## 2019-03-08 NOTE — Telephone Encounter (Signed)
Letter written and sent through [place].  Could print and fax if needed.

## 2019-03-08 NOTE — Discharge Instructions (Signed)

## 2019-03-18 ENCOUNTER — Telehealth: Payer: Self-pay | Admitting: Internal Medicine

## 2019-03-18 DIAGNOSIS — K219 Gastro-esophageal reflux disease without esophagitis: Secondary | ICD-10-CM

## 2019-03-18 MED ORDER — OMEPRAZOLE 40 MG PO CPDR: DELAYED_RELEASE_CAPSULE | ORAL | 0 refills | Status: DC

## 2019-03-18 NOTE — Telephone Encounter (Signed)
Refilled Omeprazole 

## 2019-03-25 ENCOUNTER — Ambulatory Visit: Payer: BC Managed Care – PPO | Admitting: Family Medicine

## 2019-04-16 ENCOUNTER — Ambulatory Visit: Payer: BC Managed Care – PPO | Admitting: Internal Medicine

## 2019-05-20 ENCOUNTER — Other Ambulatory Visit: Payer: Self-pay | Admitting: Family Medicine

## 2019-05-20 DIAGNOSIS — I10 Essential (primary) hypertension: Secondary | ICD-10-CM

## 2019-05-22 ENCOUNTER — Ambulatory Visit: Payer: BC Managed Care – PPO | Admitting: Internal Medicine

## 2019-05-22 ENCOUNTER — Encounter: Payer: Self-pay | Admitting: Internal Medicine

## 2019-05-22 VITALS — BP 106/78 | HR 80 | Temp 98.0°F | Ht 69.0 in | Wt 254.0 lb

## 2019-05-22 DIAGNOSIS — K5900 Constipation, unspecified: Secondary | ICD-10-CM | POA: Diagnosis not present

## 2019-05-22 DIAGNOSIS — K219 Gastro-esophageal reflux disease without esophagitis: Secondary | ICD-10-CM | POA: Diagnosis not present

## 2019-05-22 MED ORDER — OMEPRAZOLE 40 MG PO CPDR: DELAYED_RELEASE_CAPSULE | ORAL | 3 refills | Status: DC

## 2019-05-22 NOTE — Patient Instructions (Signed)
We have sent the following medications to your pharmacy for you to pick up at your convenience: Omeprazole  You may take Miralax daily - 1 scoop in water

## 2019-05-22 NOTE — Progress Notes (Signed)
HISTORY OF PRESENT ILLNESS:  Tonya Suarez is a 49 y.o. female, quality Market researcher from [place], who presents today regarding ongoing management of chronic GERD and to address complaints of ongoing constipation.  She was last evaluated in this office April 2018 regarding chronic GERD, a sister with duodenal cancer, a family history of colon cancer in her father in his 49s, and obesity.  See that dictation for details.  She subsequently underwent both colonoscopy and upper endoscopy on September 16, 2016.  Complete colonoscopy revealed a diminutive colon polyp which was removed and found to be a tubular adenoma.  As well internal hemorrhoids.  Follow-up in 5 years recommended.  Upper endoscopy revealed no significant abnormalities.  For symptomatic GERD she has been on omeprazole 40 mg daily.  This helps to ameliorate all symptoms.  Off medication, significant recurrence of symptoms.  In terms of constipation, she reports having a bowel movement once or twice per week.  She is uncomfortable without having a regular bowel movement.  No bleeding.  No weight loss.  Review of outside blood work from May 2020 finds unremarkable comprehensive metabolic panel and CBC.  Hemoglobin 15.2.  Prior abdominal ultrasound from 2014 was normal  REVIEW OF SYSTEMS:  All non-GI ROS negative unless otherwise stated in the HPI except for arthritis  Past Medical History:  Diagnosis Date  . Allergy   . Arthritis   . Chronic headaches   . Depression   . GERD (gastroesophageal reflux disease)   . Hypertension   . Low back pain   . UTI (urinary tract infection)     Past Surgical History:  Procedure Laterality Date  . TUBAL LIGATION      Social History Tonya Suarez  reports that she has been smoking cigars. She has never used smokeless tobacco. She reports current alcohol use. She reports that she does not use drugs.  family history includes Cancer - Other in her sister; Colon  cancer in her father, maternal aunt, and paternal grandmother; Diabetes in her father and mother; Hyperlipidemia in her father and mother; Hypertension in her father and mother; Kidney disease in her mother; Lung cancer in her father; Stomach cancer in her sister.  Allergies  Allergen Reactions  . Amoxicillin Swelling    Lip swelling       PHYSICAL EXAMINATION: Vital signs: BP 106/78   Pulse 80   Temp 98 F (36.7 C)   Ht 5\' 9"  (1.753 m)   Wt 254 lb (115.2 kg)   LMP 06/05/2018   BMI 37.51 kg/m   Constitutional: generally well-appearing, no acute distress Psychiatric: alert and oriented x3, cooperative Eyes: extraocular movements intact, anicteric, conjunctiva pink Mouth: oral pharynx moist, no lesions Neck: supple no lymphadenopathy Cardiovascular: heart regular rate and rhythm, no murmur Lungs: clear to auscultation bilaterally Abdomen: soft, obese, nontender, nondistended, no obvious ascites, no peritoneal signs, normal bowel sounds, no organomegaly Rectal: Omitted Extremities: no clubbing, cyanosis, or lower extremity edema bilaterally Skin: no lesions on visible extremities Neuro: No focal deficits.  Cranial nerves intact  ASSESSMENT:  1.  Chronic GERD.  Normal EGD.  Requires PPI to control symptoms 2.  Obesity.  Ongoing.  Patient aware. 3.  Family history of colon cancer and personal history of adenomatous colon polyp.  Surveillance up-to-date 4.  Chronic constipation.  Ongoing   PLAN:  1.  Reflux precautions 2.  Weight loss 3.  Continue omeprazole 40 mg daily.  Prescription refilled electronically.  Multiple refills.  Medication  risks reviewed 4.  MiraLAX for constipation.  I discussed with her the proper way to gradually titrate the medication in order to achieve the desired result. 5.  Surveillance colonoscopy around July 2023 6.  Routine GI follow-up in the office in 2 years.  She knows to contact the office in the interim for any questions or problems A total  time of 30 minutes was spent preparing to see the patient, reviewing outside records and test, obtaining comprehensive history, performing medically appropriate exam, counseling the patient regarding the above listed issues, ordering medications and reviewing the risk profile, and documenting clinical information in the health record

## 2019-05-30 ENCOUNTER — Ambulatory Visit: Payer: BC Managed Care – PPO | Attending: Internal Medicine

## 2019-05-30 DIAGNOSIS — Z23 Encounter for immunization: Secondary | ICD-10-CM

## 2019-05-30 NOTE — Progress Notes (Signed)
   Covid-19 Vaccination Clinic  Name:  Tonya Suarez    MRN: [id number] DOB: 10-04-1970  05/30/2019  [name] was observed post Covid-19 immunization for 15 minutes without incident. She was provided with Vaccine Information Sheet and instruction to access the V-Safe system.   [name] was instructed to call 911 with any severe reactions post vaccine: Marland Kitchen Difficulty breathing  . Swelling of face and throat  . A fast heartbeat  . A bad rash all over body  . Dizziness and weakness   Immunizations Administered    Name Date Dose VIS Date Route   Pfizer COVID-19 Vaccine 05/30/2019  4:29 PM 0.3 mL 02/01/2019 Intramuscular   Manufacturer: [place]   Lot: [id number]   [place]: [id number]

## 2019-06-18 ENCOUNTER — Other Ambulatory Visit: Payer: Self-pay

## 2019-06-18 ENCOUNTER — Encounter: Payer: Self-pay | Admitting: Family Medicine

## 2019-06-18 ENCOUNTER — Ambulatory Visit: Payer: Self-pay

## 2019-06-18 ENCOUNTER — Other Ambulatory Visit: Payer: Self-pay | Admitting: Family Medicine

## 2019-06-18 ENCOUNTER — Ambulatory Visit: Payer: BC Managed Care – PPO | Admitting: Family Medicine

## 2019-06-18 VITALS — BP 100/72 | HR 81 | Ht 69.0 in | Wt 250.0 lb

## 2019-06-18 DIAGNOSIS — M5417 Radiculopathy, lumbosacral region: Secondary | ICD-10-CM

## 2019-06-18 DIAGNOSIS — M7061 Trochanteric bursitis, right hip: Secondary | ICD-10-CM

## 2019-06-18 NOTE — Progress Notes (Signed)
I, [name], LAT, ATC, am serving as scribe for Dr. [name].  Tonya Suarez is a 49 y.o. female who presents to [place] at [facility] today for f/u of low back pain and leg pain.  She was last seen by Dr. Georgina Snell on 01/29/19 and was referred for an L-spine MRI which then led to a L L5-S1 epidural on 03/08/19.  Since her last visit, pt reports that her symptoms flared up about a month ago.   She states that she has some pain in her L leg like before but is now having more pain in her R leg that will radiate from her R glute to her R leg, sometimes going as far down as her R calf and ankle.  She denies LE numbness/tingling.  She con't to take Gabapentin and Meloxicam.   Majority of her pain is located in the right lateral hip extending to the right lateral thigh.  She notes if that pain were gone her symptoms would not be bad enough to need to go to the doctor.  Diagnostic imaging: L-spine XR- 01/25/19; L-spine MRI- 02/18/19  Pertinent review of systems: No fevers or chills  Relevant historical information: Hypertension   Exam:  BP 100/72 (BP Location: Right Arm, Patient Position: Sitting, Cuff Size: Large)   Pulse 81   Ht 5\' 9"  (1.753 m)   Wt 250 lb (113.4 kg)   LMP 06/05/2018   SpO2 96%   BMI 36.92 kg/m  General: Well Developed, well nourished, and in no acute distress.   MSK: L-spine normal-appearing nontender normal lumbar motion. Negative straight leg raise test bilaterally. Lower extremity strength reflexes and sensation are intact except noted below. Right hip normal-appearing normal motion. Tender palpation greater trochanter.  Hip abduction strength diminished 4/5 with pain. External rotation strength intact however does reproduce pain.     Lab and Radiology Results EXAM: MRI LUMBAR SPINE WITHOUT CONTRAST  TECHNIQUE: Multiplanar, multisequence MR imaging of the lumbar spine was performed. No intravenous contrast was  administered.  COMPARISON:  Prior radiograph from 01/25/2019 as well as previous MRI from 09/26/2004.  FINDINGS: Segmentation: Standard. Lowest well-formed disc space labeled the L5-S1 level.  Alignment: Physiologic with preservation of the normal lumbar lordosis. No listhesis.  Vertebrae: Vertebral body height maintained without evidence for acute or chronic fracture. Bone marrow signal intensity within normal limits. Few scattered benign hemangiomata noted, most prominent of which measures 17 mm within the central aspect of the L2 vertebral body. No other discrete or worrisome osseous lesions. Reactive endplate changes present about the L5-S1 interspace. No other abnormal marrow edema.  Conus medullaris and cauda equina: Conus extends to the T12-L1 level. Conus and cauda equina appear normal.  Paraspinal and other soft tissues: Paraspinous soft tissues within normal limits. Visualized visceral structures are normal.  Disc levels:  T11-12: Seen only on sagittal projection. Diffuse disc bulge with disc desiccation and intervertebral disc space narrowing. Right greater than left facet hypertrophy. No significant spinal stenosis. Mild right foraminal narrowing.  T12-L1: Unremarkable.  L1-2:  Unremarkable.  L2-3: Mild diffuse disc bulge with disc desiccation. Superimposed shallow left foraminal/extraforaminal disc protrusion contacts the exiting left L2 nerve root as it courses of the left neural foramen (series 10, image 17). No significant spinal stenosis. Foramina remain patent.  L3-4: Negative interspace. Moderate facet and ligament flavum hypertrophy. No significant canal or foraminal stenosis.  L4-5: Mild disc bulge with disc desiccation. Superimposed tiny central disc protrusion minimally indents the ventral  thecal sac. Moderate facet and ligament flavum hypertrophy. Resultant mild left lateral recess stenosis. Central canal remains patent.  Mild bilateral L4 foraminal narrowing, slightly worse on the right.  L5-S1: Chronic intervertebral disc space narrowing with diffuse disc bulge and disc desiccation. Reactive endplate changes with marginal endplate osteophytic spurring. Superimposed left subarticular disc extrusion with superior migration. Extruded disc material extends into the left lateral recess, impinging upon the descending left S1 nerve root (series 13, image 35). Superimposed mild to moderate right greater than left facet hypertrophy. Mild canal with moderate left lateral recess stenosis. Foramina remain patent.  IMPRESSION: 1. Left subarticular disc extrusion at L5-S1, impinging upon the descending left S1 nerve root in the left lateral recess. 2. Shallow left foraminal/extraforaminal disc protrusion at L2-3, contacting and potentially irritating the exiting left L2 nerve root. 3. Mild disc bulging with facet hypertrophy at L4-5 with resultant mild left lateral recess stenosis.   Electronically Signed   By: Jeannine Boga M.D.   On: 02/19/2019 03:57  I, [name], personally (independently) visualized and performed the interpretation of the images attached in this note.  Procedure: Real-time Ultrasound Guided Injection of right lateral greater trochanter bursa Device: Philips Affiniti 50G Images permanently stored and available for review in the ultrasound unit. Verbal informed consent obtained.  Discussed risks and benefits of procedure. Warned about infection bleeding damage to structures skin hypopigmentation and fat atrophy among others. Patient expresses understanding and agreement Time-out conducted.   Noted no overlying erythema, induration, or other signs of local infection.   Skin prepped in a sterile fashion.   Local anesthesia: Topical Ethyl chloride.   With sterile technique and under real time ultrasound guidance:  40 mg of Kenalog and 2 mL of Marcaine injected easily.   Completed  without difficulty   Pain immediately resolved suggesting accurate placement of the medication.   Advised to call if fevers/chills, erythema, induration, drainage, or persistent bleeding.   Images permanently stored and available for review in the ultrasound unit.  Impression: Technically successful ultrasound guided injection.        Assessment and Plan: 49 y.o. female with right lateral hip pain.  Pain predominantly today is due to trochanteric bursitis/hip abductor tendinopathy.  I doubtful that the majority of her right leg symptoms are radicular in nature.  She does have some mild left S1 radiculopathy and possibly also some mild L3 or L2 radiculopathy on the left side as well.  However she notes that her left-sided symptoms are not bad enough to need to go to the doctor at this point. Plan to treat the right lateral hip pain with greater trochanter bursa injection as above and referral to physical therapy for home exercise program.  Recheck if not improving.   PDMP not reviewed this encounter. Orders Placed This Encounter  Procedures  . [place] LIMITED JOINT SPACE STRUCTURES LOW RIGHT(NO LINKED CHARGES)    Order Specific Question:   Reason for Exam (SYMPTOM  OR DIAGNOSIS REQUIRED)    Answer:   trochanteric bursitis    Order Specific Question:   Preferred imaging location?    Answer:   [place]  . Ambulatory referral to Physical Therapy    Referral Priority:   Routine    Referral Type:   Physical Medicine    Referral Reason:   Specialty Services Required    Requested Specialty:   Physical Therapy   No orders of the defined types were placed in this encounter.    Discussed warning  signs or symptoms. Please see discharge instructions. Patient expresses understanding.   The above documentation has been reviewed and is accurate and complete [name]

## 2019-06-18 NOTE — Patient Instructions (Signed)
Thank you for coming in today. Call or go to the ER if you develop a large red swollen joint with extreme pain or oozing puss.  Attend PT  Recheck or contact me if not better.     Hip Bursitis  Hip bursitis is swelling of a fluid-filled sac (bursa) in your hip joint. This swelling (inflammation) can be painful. This condition may come and go over time. What are the causes?  Injury to the hip.  Overuse of the muscles that surround the hip joint.  An earlier injury or surgery of the hip.  Arthritis or gout.  Diabetes.  Thyroid disease.  Infection.  In some cases, the cause may not be known. What are the signs or symptoms?  Mild or moderate pain in the hip area. Pain may get worse with movement.  Tenderness and swelling of the hip, especially on the outer side of the hip.  In rare cases, the bursa may become infected. This may cause: ? A fever. ? Warmth and redness in the area. Symptoms may come and go. How is this treated? This condition is treated by resting, icing, applying pressure (compression), and raising (elevating) the injured area. You may hear this called the RICE treatment. Treatment may also include:  Using crutches.  Draining fluid out of the bursa to help relieve swelling.  Giving a shot of (injecting) medicine that helps to reduce swelling (cortisone).  Other medicines if the bursa is infected. Follow these instructions at home: Managing pain, stiffness, and swelling   If told, put ice on the painful area. ? Put ice in a plastic bag. ? Place a towel between your skin and the bag. ? Leave the ice on for 20 minutes, 2-3 times a day. ? Raise (elevate) your hip above the level of your heart as much as you can without pain. To do this, try putting a pillow under your hips while you lie down. Stop if this causes pain. Activity  Return to your normal activities as told by your doctor. Ask your doctor what activities are safe for you.  Rest and  protect your hip as much as you can until you feel better. General instructions  Take over-the-counter and prescription medicines only as told by your doctor.  Wear wraps that put pressure on your hip (compression wraps) only as told by your doctor.  Do not use your hip to support your body weight until your doctor says that you can.  Use crutches as told by your doctor.  Gently rub and stretch your injured area as often as is comfortable.  Keep all follow-up visits as told by your doctor. This is important. How is this prevented?  Exercise regularly, as told by your doctor.  Warm up and stretch before being active.  Cool down and stretch after being active.  Avoid activities that bother your hip or cause pain.  Avoid sitting down for long periods at a time. Contact a doctor if:  You have a fever.  You get new symptoms.  You have trouble walking.  You have trouble doing everyday activities.  You have pain that gets worse.  You have pain that does not get better with medicine.  You get red skin on your hip area.  You get a feeling of warmth in your hip area. Get help right away if:  You cannot move your hip.  You have very bad pain. Summary  Hip bursitis is swelling of a fluid-filled sac (bursa) in your hip.  Hip bursitis can be painful.  Symptoms often come and go over time.  This condition is treated with rest, ice, compression, elevation, and medicines. This information is not intended to replace advice given to you by your health care provider. Make sure you discuss any questions you have with your health care provider. Document Revised: 10/16/2017 Document Reviewed: 10/16/2017 Elsevier Patient Education  Tonya Suarez.

## 2019-06-21 ENCOUNTER — Telehealth: Payer: Self-pay | Admitting: Family Medicine

## 2019-06-21 NOTE — Telephone Encounter (Signed)
If she calls back, please schedule her for PT.  Thanks.

## 2019-06-26 ENCOUNTER — Ambulatory Visit: Payer: BC Managed Care – PPO

## 2019-06-26 ENCOUNTER — Ambulatory Visit: Payer: BC Managed Care – PPO | Attending: Internal Medicine

## 2019-06-26 DIAGNOSIS — Z23 Encounter for immunization: Secondary | ICD-10-CM

## 2019-06-26 NOTE — Progress Notes (Signed)
   Covid-19 Vaccination Clinic  Name:  Tonya Suarez    MRN: [id number] DOB: 04/25/1970  06/26/2019  Tonya Suarez was observed post Covid-19 immunization for 15 minutes without incident. She was provided with Vaccine Information Sheet and instruction to access the V-Safe system.   Tonya Suarez was instructed to call 911 with any severe reactions post vaccine: Marland Kitchen Difficulty breathing  . Swelling of face and throat  . A fast heartbeat  . A bad rash all over body  . Dizziness and weakness   Immunizations Administered    Name Date Dose VIS Date Route   Pfizer COVID-19 Vaccine 06/26/2019 12:26 PM 0.3 mL 04/17/2018 Intramuscular   Manufacturer: [place]   Lot: [id number]   [place]: [id number]

## 2019-07-24 DIAGNOSIS — Z1231 Encounter for screening mammogram for malignant neoplasm of breast: Secondary | ICD-10-CM | POA: Diagnosis not present

## 2019-07-24 LAB — HM MAMMOGRAPHY

## 2019-08-13 ENCOUNTER — Encounter: Payer: Self-pay | Admitting: Family Medicine

## 2019-08-20 ENCOUNTER — Encounter: Payer: Self-pay | Admitting: Family Medicine

## 2019-08-20 ENCOUNTER — Telehealth (INDEPENDENT_AMBULATORY_CARE_PROVIDER_SITE_OTHER): Payer: BC Managed Care – PPO | Admitting: Family Medicine

## 2019-08-20 DIAGNOSIS — R197 Diarrhea, unspecified: Secondary | ICD-10-CM

## 2019-08-20 DIAGNOSIS — R0981 Nasal congestion: Secondary | ICD-10-CM | POA: Diagnosis not present

## 2019-08-20 DIAGNOSIS — I10 Essential (primary) hypertension: Secondary | ICD-10-CM

## 2019-08-20 DIAGNOSIS — R05 Cough: Secondary | ICD-10-CM | POA: Diagnosis not present

## 2019-08-20 DIAGNOSIS — R0602 Shortness of breath: Secondary | ICD-10-CM | POA: Diagnosis not present

## 2019-08-20 DIAGNOSIS — R059 Cough, unspecified: Secondary | ICD-10-CM

## 2019-08-20 MED ORDER — BENZONATATE 100 MG PO CAPS
100.0000 mg | ORAL_CAPSULE | Freq: Two times a day (BID) | ORAL | 0 refills | Status: DC | PRN
Start: 2019-08-20 — End: 2020-03-03

## 2019-08-20 MED ORDER — ALBUTEROL SULFATE HFA 108 (90 BASE) MCG/ACT IN AERS: 2.0000 | INHALATION_SPRAY | Freq: Four times a day (QID) | RESPIRATORY_TRACT | 0 refills | Status: DC | PRN

## 2019-08-20 NOTE — Progress Notes (Addendum)
Virtual Visit via Video Note  I connected with Tonya Suarez  on 08/20/19 at 12:20 PM EDT by a video enabled telemedicine application and verified that I am speaking with the correct person using two identifiers.  Location patient: home, [place] Location provider:work or home office Persons participating in the virtual visit: patient, provider  I discussed the limitations of evaluation and management by telemedicine and the availability of in person appointments. The patient expressed understanding and agreed to proceed.   HPI:  Acute visit sinus issues: -started  the last 2-3 days, had some mild PND, tickle in throat prior -symptoms include itchy throat,  cough, nasal congestion, PND, body aches, sneezing, HA, some diarrhea, mild SOB on and off, weeks and now is white -denies fever, nausea, vomiting, inability to get out of bed -granddaughter was sick with similar  symptoms, she went to the doctor - she was negative for COVID but had similar symptoms -patient is fully vaccinated for COVID19 with pfizer vaccine -requests work note for today -also had elevated DBP on vital request in 90s, she reports is pretty typical for her, SBP 120.  ROS: See pertinent positives and negatives per HPI.  Past Medical History:  Diagnosis Date  . Allergy   . Arthritis   . Chronic headaches   . Depression   . GERD (gastroesophageal reflux disease)   . Hypertension   . Low back pain   . UTI (urinary tract infection)     Past Surgical History:  Procedure Laterality Date  . TUBAL LIGATION      Family History  Problem Relation Age of Onset  . Diabetes Father        mother  . Hyperlipidemia Father        mother  . Hypertension Father        mother  . Colon cancer Father        21s, and grandmother  . Lung cancer Father        in 37s  . Cancer - Other Sister        duodenal  . Stomach cancer Sister   . Diabetes Mother   . Hypertension Mother   . Hyperlipidemia Mother   . Kidney disease Mother         has fistula- could need dialysis at any time  . Colon cancer Paternal Grandmother   . Colon cancer Maternal Aunt   . Esophageal cancer Neg Hx   . Liver cancer Neg Hx   . Pancreatic cancer Neg Hx   . Rectal cancer Neg Hx     SOCIAL HX: see hpi   Current Outpatient Medications:  .  amLODipine (NORVASC) 2.5 MG tablet, TAKE 1 TABLET BY MOUTH EVERY DAY, Disp: 90 tablet, Rfl: 1 .  azelastine (ASTELIN) 0.1 % nasal spray, Place 2 sprays into both nostrils 2 (two) times daily., Disp: 30 mL, Rfl: 12 .  cyclobenzaprine (FLEXERIL) 5 MG tablet, Take 1 tablet (5 mg total) by mouth 3 (three) times daily as needed for muscle spasms., Disp: 30 tablet, Rfl: 1 .  fluticasone (FLONASE) 50 MCG/ACT nasal spray, SPRAY 2 SPRAYS INTO EACH NOSTRIL EVERY DAY, Disp: 48 mL, Rfl: 2 .  gabapentin (NEURONTIN) 300 MG capsule, One tab PO qHS for a week, then BID for a week, then TID. May double weekly to a max of 3,600mg /day, Disp: 180 capsule, Rfl: 3 .  hydrochlorothiazide (HYDRODIURIL) 25 MG tablet, TAKE 1 TABLET BY MOUTH EVERY DAY, Disp: 90 tablet, Rfl: 1 .  HYDROcodone-acetaminophen (NORCO/VICODIN)  5-325 MG tablet, Take 1 tablet by mouth every 6 (six) hours as needed for severe pain (do not drive for 8 hours after taking.)., Disp: 16 tablet, Rfl: 0 .  meloxicam (MOBIC) 7.5 MG tablet, TAKE 1 TABLET (7.5 MG TOTAL) BY MOUTH DAILY AS NEEDED FOR PAIN., Disp: 90 tablet, Rfl: 1 .  omeprazole (PRILOSEC) 40 MG capsule, TAKE 1 CAPSULE BY MOUTH EVERY DAY;, Disp: 90 capsule, Rfl: 3 .  traMADol (ULTRAM) 50 MG tablet, Take 1-2 tablets (50-100 mg total) by mouth every 6 (six) hours as needed., Disp: 25 tablet, Rfl: 0 .  albuterol (VENTOLIN HFA) 108 (90 Base) MCG/ACT inhaler, Inhale 2 puffs into the lungs every 6 (six) hours as needed., Disp: 8.5 g, Rfl: 0 .  benzonatate (TESSALON) 100 MG capsule, Take 1 capsule (100 mg total) by mouth 2 (two) times daily as needed for cough., Disp: 20 capsule, Rfl: 0  EXAM:  VITALS per patient  if applicable:  GENERAL: alert, oriented, appears well and in no acute distress  HEENT: atraumatic, conjunttiva clear, no obvious abnormalities on inspection of external nose and ears  NECK: normal movements of the head and neck  LUNGS: on inspection no signs of respiratory distress, breathing rate appears normal, no obvious gross SOB, gasping or wheezing  CV: no obvious cyanosis  MS: moves all visible extremities without noticeable abnormality  PSYCH/NEURO: pleasant and cooperative, no obvious depression or anxiety, speech and thought processing grossly intact  ASSESSMENT AND PLAN:  Discussed the following assessment and plan:  Sinus congestion  Diarrhea, unspecified type  Cough  SOB (shortness of breath)  -we discussed possible serious and likely etiologies, options for evaluation and workup, limitations of telemedicine visit vs in person visit, treatment, treatment risks and precautions. Pt prefers to treat via telemedicine empirically rather then risking or undertaking an in person visit at this moment. Suspect viral illness as cause of symptoms given hx and exposure. Some symptoms resemble flu, less likely but not impossible tis time of the year. At this point tamiflu less likely to be helpful and she declined. COVID19 is less likely given fully vaccinated and negative test in family member with same symptoms. Some symptoms of possible bronchial involvement. She opted for cough medication (tessalon rx), inhaler - instructions for use and demonstration provided, OTC nasal saline, analgesic if needed discussed. Patient agrees to seek prompt in person care if worsening, new symptoms arise, or if is not improving with treatment. Adivsed caution with nsaids given elevated DBP. Advised monitoring at home and if continues to run high to contact PCP office.   I discussed the assessment and treatment plan with the patient. The patient was provided an opportunity to ask questions and all  were answered. The patient agreed with the plan and demonstrated an understanding of the instructions.   The patient was advised to call back or seek an in-person evaluation if the symptoms worsen or if the condition fails to improve as anticipated.   Lucretia Kern, DO

## 2019-08-21 DIAGNOSIS — Z03818 Encounter for observation for suspected exposure to other biological agents ruled out: Secondary | ICD-10-CM | POA: Diagnosis not present

## 2019-08-21 DIAGNOSIS — Z20822 Contact with and (suspected) exposure to covid-19: Secondary | ICD-10-CM | POA: Diagnosis not present

## 2019-08-22 ENCOUNTER — Encounter: Payer: Self-pay | Admitting: Family Medicine

## 2019-08-22 ENCOUNTER — Telehealth: Payer: Self-pay | Admitting: Family Medicine

## 2019-08-22 NOTE — Telephone Encounter (Signed)
Can you please send work note:  Seen and date for medical visit. Please excuse from work today and until symptoms have resolved.   Thanks!

## 2019-08-22 NOTE — Telephone Encounter (Signed)
Spoke with the pt and per pts request, the letter was completed and sent via [employer].

## 2019-08-22 NOTE — Telephone Encounter (Signed)
Patient is calling in stating she seen Dr.Kim on 08/20/2019, and she was supposed to get a work note but never received it, and is needing to return to work today.

## 2019-09-05 ENCOUNTER — Other Ambulatory Visit: Payer: Self-pay | Admitting: Family Medicine

## 2019-09-23 NOTE — Progress Notes (Deleted)
49 y.o. G32P2002 Married Black or [place] American Not Hispanic or Latino female here for annual exam.      Patient's last menstrual period was 06/05/2018.          Sexually active: {yes no:314532}  The current method of family planning is {contraception:315051}.    Exercising: {yes no:314532}  {types:19826} Smoker:  {YES [id number]  Health Maintenance: Pap:09/20/18 ASCUS   HPV neg 01/13/2016 ASCUS NEG HPV, 03-19-14 WNL History of abnormal Pap:  Yes ASCUS in 2017, and 2020 MMG:  07/24/19 Bi-rads 1 neg  BMD:   Never  Colonoscopy: 09/16/16 polyp f/u 5 years  TDaP:  11/21/11 Gardasil: na   reports that she has been smoking cigars. She has never used smokeless tobacco. She reports current alcohol use. She reports that she does not use drugs.  Past Medical History:  Diagnosis Date  . Allergy   . Arthritis   . Chronic headaches   . Depression   . GERD (gastroesophageal reflux disease)   . Hypertension   . Low back pain   . UTI (urinary tract infection)     Past Surgical History:  Procedure Laterality Date  . TUBAL LIGATION      Current Outpatient Medications  Medication Sig Dispense Refill  . albuterol (VENTOLIN HFA) 108 (90 Base) MCG/ACT inhaler TAKE 2 PUFFS BY MOUTH EVERY 6 HOURS AS NEEDED 8.5 g 2  . amLODipine (NORVASC) 2.5 MG tablet TAKE 1 TABLET BY MOUTH EVERY DAY 90 tablet 1  . azelastine (ASTELIN) 0.1 % nasal spray Place 2 sprays into both nostrils 2 (two) times daily. 30 mL 12  . benzonatate (TESSALON) 100 MG capsule Take 1 capsule (100 mg total) by mouth 2 (two) times daily as needed for cough. 20 capsule 0  . cyclobenzaprine (FLEXERIL) 5 MG tablet Take 1 tablet (5 mg total) by mouth 3 (three) times daily as needed for muscle spasms. 30 tablet 1  . fluticasone (FLONASE) 50 MCG/ACT nasal spray SPRAY 2 SPRAYS INTO EACH NOSTRIL EVERY DAY 48 mL 2  . gabapentin (NEURONTIN) 300 MG capsule One tab PO qHS for a week, then BID for a week, then TID. May double weekly to a max of  3,600mg /day 180 capsule 3  . hydrochlorothiazide (HYDRODIURIL) 25 MG tablet TAKE 1 TABLET BY MOUTH EVERY DAY 90 tablet 1  . HYDROcodone-acetaminophen (NORCO/VICODIN) 5-325 MG tablet Take 1 tablet by mouth every 6 (six) hours as needed for severe pain (do not drive for 8 hours after taking.). 16 tablet 0  . meloxicam (MOBIC) 7.5 MG tablet TAKE 1 TABLET (7.5 MG TOTAL) BY MOUTH DAILY AS NEEDED FOR PAIN. 90 tablet 1  . omeprazole (PRILOSEC) 40 MG capsule TAKE 1 CAPSULE BY MOUTH EVERY DAY; 90 capsule 3  . traMADol (ULTRAM) 50 MG tablet Take 1-2 tablets (50-100 mg total) by mouth every 6 (six) hours as needed. 25 tablet 0   No current facility-administered medications for this visit.    Family History  Problem Relation Age of Onset  . Diabetes Father        mother  . Hyperlipidemia Father        mother  . Hypertension Father        mother  . Colon cancer Father        45s, and grandmother  . Lung cancer Father        in 13s  . Cancer - Other Sister        duodenal  . Stomach cancer Sister   .  Diabetes Mother   . Hypertension Mother   . Hyperlipidemia Mother   . Kidney disease Mother        has fistula- could need dialysis at any time  . Colon cancer Paternal Grandmother   . Colon cancer Maternal Aunt   . Esophageal cancer Neg Hx   . Liver cancer Neg Hx   . Pancreatic cancer Neg Hx   . Rectal cancer Neg Hx     Review of Systems  Exam:   LMP 06/05/2018   Weight change: @WEIGHTCHANGE @ Height:      Ht Readings from Last 3 Encounters:  06/18/19 5\' 9"  (1.753 m)  05/22/19 5\' 9"  (1.753 m)  03/01/19 5\' 9"  (1.753 m)    General appearance: alert, cooperative and appears stated age Head: Normocephalic, without obvious abnormality, atraumatic Neck: no adenopathy, supple, symmetrical, trachea midline and thyroid {CHL AMB PHY EX THYROID NORM DEFAULT:[id number]::"normal to inspection and palpation"} Lungs: clear to auscultation bilaterally Cardiovascular: regular rate and  rhythm Breasts: {Exam; breast:13139::"normal appearance, no masses or tenderness"} Abdomen: soft, non-tender; non distended,  no masses,  no organomegaly Extremities: extremities normal, atraumatic, no cyanosis or edema Skin: Skin color, texture, turgor normal. No rashes or lesions Lymph nodes: Cervical, supraclavicular, and axillary nodes normal. No abnormal inguinal nodes palpated Neurologic: Grossly normal   Pelvic: External genitalia:  no lesions              Urethra:  normal appearing urethra with no masses, tenderness or lesions              Bartholins and Skenes: normal                 Vagina: normal appearing vagina with normal color and discharge, no lesions              Cervix: {CHL AMB PHY EX CERVIX NORM [phone]::"no lesions"}               Bimanual Exam:  Uterus:  {CHL AMB PHY EX UTERUS NORM DEFAULT:[phone]::"normal size, contour, position, consistency, mobility, non-tender"}              Adnexa: {CHL AMB PHY EX ADNEXA NO MASS DEFAULT:[phone]::"no mass, fullness, tenderness"}               Rectovaginal: Confirms               Anus:  normal sphincter tone, no lesions  *** chaperoned for the exam.  A:  Well Woman with normal exam  P:

## 2019-09-25 ENCOUNTER — Ambulatory Visit: Payer: BC Managed Care – PPO | Admitting: Obstetrics and Gynecology

## 2019-10-02 ENCOUNTER — Other Ambulatory Visit: Payer: Self-pay

## 2019-10-02 ENCOUNTER — Encounter: Payer: Self-pay | Admitting: Family Medicine

## 2019-10-02 ENCOUNTER — Ambulatory Visit: Payer: BC Managed Care – PPO | Admitting: Family Medicine

## 2019-10-02 ENCOUNTER — Ambulatory Visit: Payer: Self-pay

## 2019-10-02 VITALS — BP 110/80 | HR 74 | Ht 69.0 in | Wt 247.8 lb

## 2019-10-02 DIAGNOSIS — G5701 Lesion of sciatic nerve, right lower limb: Secondary | ICD-10-CM

## 2019-10-02 DIAGNOSIS — M25561 Pain in right knee: Secondary | ICD-10-CM

## 2019-10-02 MED ORDER — PREGABALIN 75 MG PO CAPS: 75.0000 mg | ORAL_CAPSULE | Freq: Two times a day (BID) | ORAL | 3 refills | Status: DC | PRN

## 2019-10-02 MED ORDER — MELOXICAM 7.5 MG PO TABS: 7.5000 mg | ORAL_TABLET | Freq: Every day | ORAL | 1 refills | Status: DC | PRN

## 2019-10-02 NOTE — Progress Notes (Deleted)
49 y.o. G3P2002 Married Black or [place] American Not Hispanic or Latino female here for annual exam.      Patient's last menstrual period was 06/05/2018.          Sexually active: {yes no:314532}  The current method of family planning is tubal ligation.    Exercising: {yes no:314532}  {types:19826} Smoker:  {YES NO:22349}  Health Maintenance: Pap:  09-20-2018 ASCUS HPV HR neg History of abnormal Pap:  no MMG:  07-24-2019 category a density birads 1:neg BMD:   none Colonoscopy: 09-16-16 polyps, f/u 80yrs TDaP:  2013   reports that she has been smoking cigars. She has never used smokeless tobacco. She reports current alcohol use. She reports that she does not use drugs.  Past Medical History:  Diagnosis Date  . Allergy   . Arthritis   . Chronic headaches   . Depression   . GERD (gastroesophageal reflux disease)   . Hypertension   . Low back pain   . UTI (urinary tract infection)     Past Surgical History:  Procedure Laterality Date  . TUBAL LIGATION      Current Outpatient Medications  Medication Sig Dispense Refill  . albuterol (VENTOLIN HFA) 108 (90 Base) MCG/ACT inhaler TAKE 2 PUFFS BY MOUTH EVERY 6 HOURS AS NEEDED 8.5 g 2  . amLODipine (NORVASC) 2.5 MG tablet TAKE 1 TABLET BY MOUTH EVERY DAY 90 tablet 1  . azelastine (ASTELIN) 0.1 % nasal spray Place 2 sprays into both nostrils 2 (two) times daily. 30 mL 12  . benzonatate (TESSALON) 100 MG capsule Take 1 capsule (100 mg total) by mouth 2 (two) times daily as needed for cough. 20 capsule 0  . cyclobenzaprine (FLEXERIL) 5 MG tablet Take 1 tablet (5 mg total) by mouth 3 (three) times daily as needed for muscle spasms. 30 tablet 1  . fluticasone (FLONASE) 50 MCG/ACT nasal spray SPRAY 2 SPRAYS INTO EACH NOSTRIL EVERY DAY 48 mL 2  . gabapentin (NEURONTIN) 300 MG capsule One tab PO qHS for a week, then BID for a week, then TID. May double weekly to a max of 3,600mg /day 180 capsule 3  . hydrochlorothiazide (HYDRODIURIL) 25 MG  tablet TAKE 1 TABLET BY MOUTH EVERY DAY 90 tablet 1  . HYDROcodone-acetaminophen (NORCO/VICODIN) 5-325 MG tablet Take 1 tablet by mouth every 6 (six) hours as needed for severe pain (do not drive for 8 hours after taking.). 16 tablet 0  . meloxicam (MOBIC) 7.5 MG tablet TAKE 1 TABLET (7.5 MG TOTAL) BY MOUTH DAILY AS NEEDED FOR PAIN. 90 tablet 1  . omeprazole (PRILOSEC) 40 MG capsule TAKE 1 CAPSULE BY MOUTH EVERY DAY; 90 capsule 3  . traMADol (ULTRAM) 50 MG tablet Take 1-2 tablets (50-100 mg total) by mouth every 6 (six) hours as needed. 25 tablet 0   No current facility-administered medications for this visit.    Family History  Problem Relation Age of Onset  . Diabetes Father        mother  . Hyperlipidemia Father        mother  . Hypertension Father        mother  . Colon cancer Father        62s, and grandmother  . Lung cancer Father        in 28s  . Cancer - Other Sister        duodenal  . Stomach cancer Sister   . Diabetes Mother   . Hypertension Mother   . Hyperlipidemia Mother   .  Kidney disease Mother        has fistula- could need dialysis at any time  . Colon cancer Paternal Grandmother   . Colon cancer Maternal Aunt   . Esophageal cancer Neg Hx   . Liver cancer Neg Hx   . Pancreatic cancer Neg Hx   . Rectal cancer Neg Hx     Review of Systems  Exam:   LMP 06/05/2018   Weight change: @WEIGHTCHANGE @ Height:      Ht Readings from Last 3 Encounters:  10/02/19 5\' 9"  (1.753 m)  06/18/19 5\' 9"  (1.753 m)  05/22/19 5\' 9"  (1.753 m)    General appearance: alert, cooperative and appears stated age Head: Normocephalic, without obvious abnormality, atraumatic Neck: no adenopathy, supple, symmetrical, trachea midline and thyroid {CHL AMB PHY EX THYROID NORM DEFAULT:[phone]::"normal to inspection and palpation"} Lungs: clear to auscultation bilaterally Cardiovascular: regular rate and rhythm Breasts: {Exam; breast:13139::"normal appearance, no masses or  tenderness"} Abdomen: soft, non-tender; non distended,  no masses,  no organomegaly Extremities: extremities normal, atraumatic, no cyanosis or edema Skin: Skin color, texture, turgor normal. No rashes or lesions Lymph nodes: Cervical, supraclavicular, and axillary nodes normal. No abnormal inguinal nodes palpated Neurologic: Grossly normal   Pelvic: External genitalia:  no lesions              Urethra:  normal appearing urethra with no masses, tenderness or lesions              Bartholins and Skenes: normal                 Vagina: normal appearing vagina with normal color and discharge, no lesions              Cervix: {CHL AMB PHY EX CERVIX NORM DEFAULT:[phone]::"no lesions"}               Bimanual Exam:  Uterus:  {CHL AMB PHY EX UTERUS NORM [phone]::"normal size, contour, position, consistency, mobility, non-tender"}              Adnexa: {CHL AMB PHY EX ADNEXA NO MASS DEFAULT:[phone]::"no mass, fullness, tenderness"}               Rectovaginal: Confirms               Anus:  normal sphincter tone, no lesions  *** chaperoned for the exam.  A:  Well Woman with normal exam  P:

## 2019-10-02 NOTE — Progress Notes (Signed)
I, Tonya Suarez, LAT, ATC, am serving as scribe for Dr. [name].  Tonya Suarez is a 49 y.o. female who presents to [place] at [facility] today for R knee pain and swelling.  She was last seen by Dr. Georgina Snell on 06/18/19 for her low back and R LE radicular pain.  She was having the majority of her pain from her R hip to her R knee and had a R GT injection.  She was referred to PT but did not attend.  Since her last visit, pt reports that she feels pain running from her R glute to her ankle if she lays on her R side at night.  She notes that she noticed swelling in her R knee and calf on Monday which has resolved.  With very of her pain is felt in the right lateral hip radiating down the leg to the lower leg.  Pain typically is worse at bedtime.   Pertinent review of systems: No fevers or chills  Relevant historical information: Hypertension   Exam:  BP 110/80 (BP Location: Right Arm, Patient Position: Sitting, Cuff Size: Large)   Pulse 74   Ht 5\' 9"  (1.753 m)   Wt 247 lb 12.8 oz (112.4 kg)   LMP 06/05/2018   SpO2 96%   BMI 36.59 kg/m  General: Well Developed, well nourished, and in no acute distress.   MSK: L-spine: Normal-appearing Nontender. Normal lumbar motion. Negative slump test and straight leg raise test.  Lower extremity strength is intact and except noted below.  Reflexes and sensation are equal normal bilaterally.  Right hip: Normal-appearing Normal motion. Tender palpation greater trochanter. Significantly tender at piriformis region with reproduction of radiating pain. Decreased strength to abduction and external rotation.  Right calf normal-appearing with no significant swelling.  Calf diameters are equal.  No palpable cords negative Hoffman's test.   Lab and Radiology Results  EXAM: MRI LUMBAR SPINE WITHOUT CONTRAST  TECHNIQUE: Multiplanar, multisequence MR imaging of the lumbar spine was performed. No intravenous contrast  was administered.  COMPARISON:  Prior radiograph from 01/25/2019 as well as previous MRI from 09/26/2004.  FINDINGS: Segmentation: Standard. Lowest well-formed disc space labeled the L5-S1 level.  Alignment: Physiologic with preservation of the normal lumbar lordosis. No listhesis.  Vertebrae: Vertebral body height maintained without evidence for acute or chronic fracture. Bone marrow signal intensity within normal limits. Few scattered benign hemangiomata noted, most prominent of which measures 17 mm within the central aspect of the L2 vertebral body. No other discrete or worrisome osseous lesions. Reactive endplate changes present about the L5-S1 interspace. No other abnormal marrow edema.  Conus medullaris and cauda equina: Conus extends to the T12-L1 level. Conus and cauda equina appear normal.  Paraspinal and other soft tissues: Paraspinous soft tissues within normal limits. Visualized visceral structures are normal.  Disc levels:  T11-12: Seen only on sagittal projection. Diffuse disc bulge with disc desiccation and intervertebral disc space narrowing. Right greater than left facet hypertrophy. No significant spinal stenosis. Mild right foraminal narrowing.  T12-L1: Unremarkable.  L1-2:  Unremarkable.  L2-3: Mild diffuse disc bulge with disc desiccation. Superimposed shallow left foraminal/extraforaminal disc protrusion contacts the exiting left L2 nerve root as it courses of the left neural foramen (series 10, image 17). No significant spinal stenosis. Foramina remain patent.  L3-4: Negative interspace. Moderate facet and ligament flavum hypertrophy. No significant canal or foraminal stenosis.  L4-5: Mild disc bulge with disc desiccation. Superimposed tiny central disc protrusion minimally  indents the ventral thecal sac. Moderate facet and ligament flavum hypertrophy. Resultant mild left lateral recess stenosis. Central canal remains patent.  Mild bilateral L4 foraminal narrowing, slightly worse on the right.  L5-S1: Chronic intervertebral disc space narrowing with diffuse disc bulge and disc desiccation. Reactive endplate changes with marginal endplate osteophytic spurring. Superimposed left subarticular disc extrusion with superior migration. Extruded disc material extends into the left lateral recess, impinging upon the descending left S1 nerve root (series 13, image 35). Superimposed mild to moderate right greater than left facet hypertrophy. Mild canal with moderate left lateral recess stenosis. Foramina remain patent.  IMPRESSION: 1. Left subarticular disc extrusion at L5-S1, impinging upon the descending left S1 nerve root in the left lateral recess. 2. Shallow left foraminal/extraforaminal disc protrusion at L2-3, contacting and potentially irritating the exiting left L2 nerve root. 3. Mild disc bulging with facet hypertrophy at L4-5 with resultant mild left lateral recess stenosis.   Electronically Signed   By: Jeannine Boga M.D.   On: 02/19/2019 03:57   I, [name], personally (independently) visualized and performed the interpretation of the images attached in this note.    Assessment and Plan: 49 y.o. female with right leg pain.  Leg pain predominantly due to piriformis syndrome.  Doubtful for significant radiculopathy.  Plan for treatment with physical therapy.  Patient notes that she should be able to do PT now.  We will try treatment Lyrica replacing gabapentin.  Recheck if not improving.  Calf swelling now resolved.  Unclear etiology.  Currently no concern based on appearance for DVT.  However is swelling worsens again may consider vascular ultrasound to look for DVT.    Orders Placed This Encounter  Procedures  . [place] LIMITED JOINT SPACE STRUCTURES LOW RIGHT(NO LINKED CHARGES)    Order Specific Question:   Reason for Exam (SYMPTOM  OR DIAGNOSIS REQUIRED)    Answer:   R knee pain     Order Specific Question:   Preferred imaging location?    Answer:   [facility]  . Ambulatory referral to Physical Therapy    Referral Priority:   Routine    Referral Type:   Physical Medicine    Referral Reason:   Specialty Services Required    Requested Specialty:   Physical Therapy   Meds ordered this encounter  Medications  . pregabalin (LYRICA) 75 MG capsule    Sig: Take 1 capsule (75 mg total) by mouth 2 (two) times daily as needed.    Dispense:  60 capsule    Refill:  3    Replaces gabpentin  . meloxicam (MOBIC) 7.5 MG tablet    Sig: Take 1 tablet (7.5 mg total) by mouth daily as needed for pain.    Dispense:  90 tablet    Refill:  1     Discussed warning signs or symptoms. Please see discharge instructions. Patient expresses understanding.   The above documentation has been reviewed and is accurate and complete [name], M.D.

## 2019-10-02 NOTE — Patient Instructions (Signed)
Thank you for coming in today. I think this is piriformis syndrome.  Plan for PT.  Try lyrica at bedtime for nerve pain.  Let me know if that leg swells up again. I can order a blood clot ultrasound.   Recheck if not improving.    Piriformis Syndrome Rehab Ask your health care provider which exercises are safe for you. Do exercises exactly as told by your health care provider and adjust them as directed. It is normal to feel mild stretching, pulling, tightness, or discomfort as you do these exercises. Stop right away if you feel sudden pain or your pain gets worse. Do not begin these exercises until told by your health care provider. Stretching and range-of-motion exercises These exercises warm up your muscles and joints and improve the movement and flexibility of your hip and pelvis. The exercises also help to relieve pain, numbness, and tingling. Hip rotation This is an exercise in which you lie on your back and stretch the muscles that rotate your hip (hip rotators) to stretch your buttocks. 1. Lie on your back on a firm surface. 2. Pull your left / right knee toward your same shoulder with your left / right hand until your knee is pointing toward the ceiling. Hold your left / right ankle with your other hand. 3. Keeping your knee steady, gently pull your left / right ankle toward your other shoulder until you feel a stretch in your buttocks. 4. Hold this position for __________ seconds. Repeat __________ times. Complete this exercise __________ times a day. Hip extensor This is an exercise in which you lie on your back and pull your knee to your chest. 1. Lie on your back on a firm surface. Both of your legs should be straight. 2. Pull your left / right knee to your chest. Hold your leg in this position by holding onto the back of your thigh or the front of your knee. 3. Hold this position for __________ seconds. 4. Slowly return to the starting position. Repeat __________ times.  Complete this exercise __________ times a day. Strengthening exercises These exercises build strength and endurance in your hip and thigh muscles. Endurance is the ability to use your muscles for a long time, even after they get tired. Straight leg raises, side-lying This exercise strengthens the muscles that rotate the leg at the hip and move it away from your body (hip abductors). 1. Lie on your side with your left / right leg in the top position. Lie so your head, shoulder, knee, and hip line up. Bend your bottom knee to help you balance. 2. Lift your top leg 4-6 inches (10-15 cm) while keeping your toes pointed straight ahead. 3. Hold this position for __________ seconds. 4. Slowly lower your leg to the starting position. 5. Let your muscles relax completely after each repetition. Repeat __________ times. Complete this exercise __________ times a day. Hip abduction and rotation This is sometimes called quadruped (on hands and knees) exercises. 1. Get on your hands and knees on a firm, lightly padded surface. Your hands should be directly below your shoulders, and your knees should be directly below your hips. 2. Lift your left / right knee out to the side. Keep your knee bent. Do not twist your body. 3. Hold this position for __________ seconds. 4. Slowly lower your leg. Repeat __________ times. Complete this exercise __________ times a day. Straight leg raises, face-down This exercise stretches the muscles that move your hips away from the front of  the pelvis (hip extensors). 1. Lie on your abdomen on a bed or a firm surface with a pillow under your hips. 2. Squeeze your buttocks muscles and lift your left / right leg about 4-6 inches (10-15 cm) off the bed. Do not let your back arch. 3. Hold this position for __________ seconds. 4. Slowly lower your leg to the starting position. 5. Let your muscles relax completely after each repetition. Repeat __________ times. Complete this exercise  __________ times a day. This information is not intended to replace advice given to you by your health care provider. Make sure you discuss any questions you have with your health care provider. Document Revised: 05/31/2018 Document Reviewed: 11/30/2017 Elsevier Patient Education  [place].

## 2019-10-07 ENCOUNTER — Encounter: Payer: Self-pay | Admitting: Obstetrics and Gynecology

## 2019-10-07 ENCOUNTER — Telehealth: Payer: Self-pay

## 2019-10-07 ENCOUNTER — Ambulatory Visit: Payer: BC Managed Care – PPO | Admitting: Obstetrics and Gynecology

## 2019-10-07 NOTE — Telephone Encounter (Signed)
Patient did not keep AEX appointment today.

## 2019-10-07 NOTE — Telephone Encounter (Signed)
Patient did not keep AEX appointment for today.

## 2019-10-24 ENCOUNTER — Encounter: Payer: Self-pay | Admitting: Rehabilitative and Restorative Service Providers"

## 2019-10-24 ENCOUNTER — Ambulatory Visit: Payer: BC Managed Care – PPO | Admitting: Rehabilitative and Restorative Service Providers"

## 2019-10-24 ENCOUNTER — Other Ambulatory Visit: Payer: Self-pay

## 2019-10-24 DIAGNOSIS — M79604 Pain in right leg: Secondary | ICD-10-CM | POA: Diagnosis not present

## 2019-10-24 DIAGNOSIS — M6281 Muscle weakness (generalized): Secondary | ICD-10-CM

## 2019-10-24 DIAGNOSIS — M79605 Pain in left leg: Secondary | ICD-10-CM | POA: Diagnosis not present

## 2019-10-24 NOTE — Patient Instructions (Signed)
Access Code: [email or web address] URL: [email or web address] Date: 10/24/2019 Prepared by: Scot Jun  Exercises Supine Piriformis Stretch with Foot on Ground - 2 x daily - 7 x weekly - 5 sets - 30 reps Supine Bridge - 2 x daily - 7 x weekly - 10 reps - 3 sets - 2 hold Clamshell - 2 x daily - 7 x weekly - 3 sets - 10 reps Standing Lumbar Extension - 1 x daily - 7 x weekly - 1-2 sets - 10 reps

## 2019-10-24 NOTE — Therapy (Signed)
[facility] [address] [place], [place], [address] Phone: [phone]   Fax:  [phone]  Physical Therapy Evaluation  Patient Details  Name: Tonya Suarez MRN: [id number] Date of Birth: 02-08-1971 Referring Provider (PT): Dr. [name]   Encounter Date: 10/24/2019   PT End of Session - 10/24/19 1604    Visit Number 1    Number of Visits 12    Date for PT Re-Evaluation 12/19/19    Progress Note Due on Visit 10    PT Start Time 0160    PT Stop Time 1640    PT Time Calculation (min) 35 min    Activity Tolerance Patient tolerated treatment well    Behavior During Therapy [facility] for tasks assessed/performed           Past Medical History:  Diagnosis Date  . Allergy   . Arthritis   . Chronic headaches   . Depression   . GERD (gastroesophageal reflux disease)   . Hypertension   . Low back pain   . UTI (urinary tract infection)     Past Surgical History:  Procedure Laterality Date  . TUBAL LIGATION      There were no vitals filed for this visit.    Subjective Assessment - 10/24/19 1607    Subjective Pt. indicated having injection in lower back at end of 2020.  Pt. stated Lt sided pain was noted in that instance.  Pt. stated onset of Rt back/leg symptoms about 2 months later insidious onset.  Pt. stated having injection in hip area that helped some. Pt. stated having pain on Rt side from hip and down to lower leg.  Pt. stated Lt side can hurt similar at times but not as bad currently.  Pt. most noted at night but sometimes in day as well.    Limitations Sitting   sleep   How long can you sit comfortably? 5-10    How long can you stand comfortably? 1 hour    How long can you walk comfortably? 1 hour (worse after sitting prolonged then improves)    Diagnostic tests MRI    Patient Stated Goals Reduce pain, sleep better    Currently in Pain? Yes    Pain Score 4    pain at 10/10 at night   Pain Location Hip    Pain Orientation  Right;Left    Pain Descriptors / Indicators Aching    Pain Type Chronic pain    Pain Radiating Towards bilateral LE, Rt > Lt    Pain Onset More than a month ago    Aggravating Factors  lying sleeping, sitting prolonged creates stiffness    Pain Relieving Factors change position after sitting, lying on heating pad at night    Effect of Pain on Daily Activities Limited in sleeping              [facility] PT Assessment - 10/24/19 0001      Assessment   Medical Diagnosis Piriformis Rt side    Referring Provider (PT) Dr. [name]    Onset Date/Surgical Date 02/22/19    Hand Dominance Right      Precautions   Precautions None      Restrictions   Weight Bearing Restrictions No      Balance Screen   Has the patient fallen in the past 6 months No    Is the patient reluctant to leave their home because of a fear of falling?  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  REIS x 5 improved movement to 100%   No leg symptoms present   Lumbar - Right Side Bend lateral knee jt, no complaint    Lumbar - Left Side Bend lateral knee jt, lumbar tightness indicated      PROM   PROM Assessment Site Hip    Right/Left Hip Left;Right      Strength   Overall Strength Comments pain c Rt hip abd MMT    Strength Assessment Site Ankle;Knee;Hip    Right/Left Hip Left;Right    Right Hip Flexion 5/5    Right Hip Extension 4/5    Right Hip ABduction 2+/5    Left Hip  Flexion 5/5    Left Hip Extension 5/5    Left Hip ABduction 4+/5    Right/Left Knee Left;Right    Right Knee Flexion 5/5    Right Knee Extension 5/5    Left Knee Flexion 5/5    Left Knee Extension 5/5    Right/Left Ankle Left;Right    Right Ankle Dorsiflexion 5/5    Left Ankle Dorsiflexion 5/5      Flexibility   Soft Tissue Assessment /Muscle Length yes    Hamstrings Lt passive SLR 80 deg, Rt 70 deg      Palpation   Palpation comment TrP c concordant Rt LE symptoms from glute med/min Rt      Special Tests    Special Tests Lumbar    Other special tests (-) slump bilateral, (-) crossed SLR, passive SLR for radicular symptoms      Transfers   Comments 18 inch c increased difficulty, no UE use                      Objective measurements completed on examination: See above findings.       [place] Adult PT Treatment/Exercise - 10/24/19 0001      Exercises   Exercises Other Exercises;Lumbar    Other Exercises  HEP instruction/performance c cues for intervention      Lumbar Exercises: Stretches   Other Lumbar Stretch Exercise lumbar extension standing x 10, supine piriformis knee to opposite shoulder 30 sec x 3 bilateral      Lumbar Exercises: Supine   Bridge 10 reps      Lumbar Exercises: Sidelying   Clam 10 reps;Right      Manual Therapy   Manual therapy comments compression to Rt glute med/min            Trigger Point Dry Needling - 10/24/19 0001    Consent Given? Yes    Education Handout Provided No    Muscles Treated Back/Hip Gluteus medius;Gluteus minimus    Gluteus Minimus Response Twitch response elicited    Gluteus Medius Response Twitch response elicited  PT Education - 10/24/19 1603    Education Details HEP, POC, DN    Person(s) Educated Patient    Methods Explanation;Demonstration;Verbal cues;Handout    Comprehension Verbalized understanding;Returned demonstration            PT Short Term Goals - 10/24/19 1604       PT SHORT TERM GOAL #1   Title Patient will demonstrate independent use of home exercise program to maintain progress from in clinic treatments.    Time 2    Period Weeks    Status New    Target Date 11/07/19             PT Long Term Goals - 10/24/19 1645      PT LONG TERM GOAL #1   Title Patient will demonstrate/report pain at worst less than or equal to 2/10 to facilitate minimal limitation in daily activity secondary to pain symptoms.    Time 8    Period Weeks    Status New    Target Date 12/19/19      PT LONG TERM GOAL #2   Title Patient will demonstrate independent use of home exercise program to facilitate ability to maintain/progress functional gains from skilled physical therapy services.    Time 8    Period Weeks    Status New    Target Date 12/19/19      PT LONG TERM GOAL #3   Title Pt. will demonstrate bilateral hip MMT 5/5 throughout to facilitate stability in daily movement.    Time 8    Period Weeks    Status New    Target Date 12/19/19      PT LONG TERM GOAL #4   Title Pt. will demonstrate Rt passive SLR equal to Lt for normalized mobility.    Time 8    Period Weeks    Status New    Target Date 12/19/19      PT LONG TERM GOAL #5   Title Pt. will demonstrate/report ability to sleep, sit during daily activity at PLOF.    Time 8    Period Weeks    Status New    Target Date 12/19/19                  Plan - 10/24/19 1604    Clinical Impression Statement Patient is a 49 y.o. female who comes to clinic with complaints of bilateral LE pain, Rt > Lt with mobility, strength deficits that impair their ability to perform usual daily and recreational functional activities without increase difficulty/symptoms at this time.  Patient to benefit from skilled PT services to address impairments and limitations to improve to previous level of function without restriction secondary to condition.    Personal Factors and Comorbidities Comorbidity 3+     Comorbidities depression, gerd, htn,    Examination-Activity Limitations Sleep;Sit;Squat;Transfers    Examination-Participation Restrictions Community Activity;Occupation;Other   sleep   Stability/Clinical Decision Making Stable/Uncomplicated    Clinical Decision Making Low    Rehab Potential Good    PT Frequency --   1-2x/week   PT Duration 8 weeks    PT Treatment/Interventions ADLs/Self Care Home Management;Cryotherapy;Electrical Stimulation;Iontophoresis 4mg /ml Dexamethasone;Moist Heat;Traction;Balance training;Therapeutic exercise;Therapeutic activities;Functional mobility training;Stair training;Gait training;Ultrasound;Neuromuscular re-education;Patient/family education;Manual techniques;Taping;Dry needling;Passive range of motion;Spinal Manipulations;Joint Manipulations    PT Next Visit Plan DN prn, hip strengthening/stabilization    PT Home Exercise Plan [place] and Agree with Plan of Care Patient  Patient will benefit from skilled therapeutic intervention in order to improve the following deficits and impairments:  Decreased endurance, Decreased activity tolerance, Decreased strength, Pain, Increased fascial restricitons, Increased muscle spasms, Decreased mobility, Decreased range of motion, Impaired perceived functional ability, Impaired flexibility  Visit Diagnosis: Pain in right leg  Pain in left leg  Muscle weakness (generalized)     Problem List Patient Active Problem List   Diagnosis Date Noted  . Current smoker 07/19/2018  . Constipation 07/19/2018  . B12 deficiency 07/19/2018  . Restless legs 07/19/2018  . Ingrown toenail 12/20/2016  . History of adenomatous polyp of colon 09/23/2016  . Migraines 05/06/2016  . Family history of cancer of GI tract 12/06/2012  . HTN (hypertension) 11/21/2011  . MENORRHAGIA 09/23/2009  . PLANTAR FASCIITIS 09/20/2007  . Osteoarthritis 09/19/2006  . Depression 09/06/2006  . Allergic rhinitis 09/06/2006    . GERD 09/06/2006   Scot Jun, PT, DPT, OCS, ATC 10/24/19  4:50 PM    [facility] [address] [place], [place], [address] Phone: [phone]   Fax:  [phone]  Name: Tonya Suarez MRN: [id number] Date of Birth: 1970-04-29

## 2019-10-30 ENCOUNTER — Encounter: Payer: BC Managed Care – PPO | Admitting: Rehabilitative and Restorative Service Providers"

## 2019-11-15 ENCOUNTER — Telehealth: Payer: Self-pay | Admitting: Rehabilitative and Restorative Service Providers"

## 2019-11-15 ENCOUNTER — Encounter: Payer: BC Managed Care – PPO | Admitting: Rehabilitative and Restorative Service Providers"

## 2019-11-15 NOTE — Telephone Encounter (Signed)
Called and left voice message about missed appointment and next appointment time on Oct 1 at 3:15  Scot Jun, PT, DPT, OCS, ATC 11/15/19  8:21 AM

## 2019-11-19 ENCOUNTER — Other Ambulatory Visit: Payer: Self-pay | Admitting: Family Medicine

## 2019-11-19 DIAGNOSIS — I10 Essential (primary) hypertension: Secondary | ICD-10-CM

## 2019-11-22 ENCOUNTER — Other Ambulatory Visit: Payer: Self-pay

## 2019-11-22 ENCOUNTER — Ambulatory Visit (INDEPENDENT_AMBULATORY_CARE_PROVIDER_SITE_OTHER): Payer: BC Managed Care – PPO | Admitting: Rehabilitative and Restorative Service Providers"

## 2019-11-22 ENCOUNTER — Encounter: Payer: Self-pay | Admitting: Rehabilitative and Restorative Service Providers"

## 2019-11-22 DIAGNOSIS — M79604 Pain in right leg: Secondary | ICD-10-CM | POA: Diagnosis not present

## 2019-11-22 DIAGNOSIS — M79605 Pain in left leg: Secondary | ICD-10-CM | POA: Diagnosis not present

## 2019-11-22 DIAGNOSIS — M545 Low back pain, unspecified: Secondary | ICD-10-CM

## 2019-11-22 DIAGNOSIS — M6281 Muscle weakness (generalized): Secondary | ICD-10-CM

## 2019-11-22 NOTE — Therapy (Signed)
[facility] [address] [place], [place], [address] Phone: [phone]   Fax:  [phone]  Physical Therapy Treatment  Patient Details  Name: Tonya Suarez MRN: [id number] Date of Birth: 09/15/1970 Referring Provider (PT): Dr. [name]   Encounter Date: 11/22/2019   PT End of Session - 11/22/19 1523    Visit Number 2    Number of Visits 12    Date for PT Re-Evaluation 12/19/19    Progress Note Due on Visit 10    PT Start Time 1518    PT Stop Time 1558    PT Time Calculation (min) 40 min    Activity Tolerance Patient tolerated treatment well    Behavior During Therapy [facility] for tasks assessed/performed           Past Medical History:  Diagnosis Date  . Allergy   . Arthritis   . Chronic headaches   . Depression   . GERD (gastroesophageal reflux disease)   . Hypertension   . Low back pain   . UTI (urinary tract infection)     Past Surgical History:  Procedure Laterality Date  . TUBAL LIGATION      There were no vitals filed for this visit.   Subjective Assessment - 11/22/19 1522    Subjective Pt. indicated feeling similar overall symptoms.  Pt. stated nightime is still the more noted time but overall feels some times in day with walking.  Rt side still worse than Lt.    Limitations Sitting   sleep   How long can you sit comfortably? 5-10    How long can you stand comfortably? 1 hour    How long can you walk comfortably? 1 hour (worse after sitting prolonged then improves)    Diagnostic tests MRI    Patient Stated Goals Reduce pain, sleep better    Currently in Pain? No/denies   no pain upon arrival today   Pain Onset More than a month ago    Aggravating Factors  nighttime, walking at times                             [facility] Adult PT Treatment/Exercise - 11/22/19 0001      Lumbar Exercises: Stretches   Piriformis Stretch 3 reps;30 seconds;Left;Right   Figure 4   Gastroc Stretch 3  reps;Left;Right;30 seconds   incline board     Lumbar Exercises: Aerobic   Nustep Lvl 6 10 mins UE/LE      Lumbar Exercises: Supine   Bridge Compliant;20 reps   2 sec hold     Lumbar Exercises: Sidelying   Clam Right;Other (comment)   3 x 15   Other Sidelying Lumbar Exercises reverse clam c pillow wedge   3 x 15     Manual Therapy   Manual therapy comments percussive device Rt glute med, max, IT band proximal and middle third                    PT Short Term Goals - 11/22/19 1545      PT SHORT TERM GOAL #1   Title Patient will demonstrate independent use of home exercise program to maintain progress from in clinic treatments.    Time 2    Period Weeks    Status Revised    Target Date 12/06/19             PT Long Term Goals - 10/24/19 1645  PT LONG TERM GOAL #1   Title Patient will demonstrate/report pain at worst less than or equal to 2/10 to facilitate minimal limitation in daily activity secondary to pain symptoms.    Time 8    Period Weeks    Status New    Target Date 12/19/19      PT LONG TERM GOAL #2   Title Patient will demonstrate independent use of home exercise program to facilitate ability to maintain/progress functional gains from skilled physical therapy services.    Time 8    Period Weeks    Status New    Target Date 12/19/19      PT LONG TERM GOAL #3   Title Pt. will demonstrate bilateral hip MMT 5/5 throughout to facilitate stability in daily movement.    Time 8    Period Weeks    Status New    Target Date 12/19/19      PT LONG TERM GOAL #4   Title Pt. will demonstrate Rt passive SLR equal to Lt for normalized mobility.    Time 8    Period Weeks    Status New    Target Date 12/19/19      PT LONG TERM GOAL #5   Title Pt. will demonstrate/report ability to sleep, sit during daily activity at PLOF.    Time 8    Period Weeks    Status New    Target Date 12/19/19                 Plan - 11/22/19 1543    Clinical  Impression Statement Occasional cues required during HEP performance.  Reduced tenderness noted in lateral superior hip c tenderness more focused posterior/inferior to greater trochanter on Rt hip.  Recommend continued strengthening to hip musculature c mobility gains for improved symptoms/progressive mobility.    Personal Factors and Comorbidities Comorbidity 3+    Comorbidities depression, gerd, htn,    Examination-Activity Limitations Sleep;Sit;Squat;Transfers    Examination-Participation Restrictions Community Activity;Occupation;Other   sleep   Stability/Clinical Decision Making Stable/Uncomplicated    Rehab Potential Good    PT Frequency --   1-2x/week   PT Duration 8 weeks    PT Treatment/Interventions ADLs/Self Care Home Management;Cryotherapy;Electrical Stimulation;Iontophoresis 4mg /ml Dexamethasone;Moist Heat;Traction;Balance training;Therapeutic exercise;Therapeutic activities;Functional mobility training;Stair training;Gait training;Ultrasound;Neuromuscular re-education;Patient/family education;Manual techniques;Taping;Dry needling;Passive range of motion;Spinal Manipulations;Joint Manipulations    PT Next Visit Plan DN/soft tissue treatment prn, hip/core strength    PT Home Exercise Plan [facility]    Consulted and Agree with Plan of Care Patient           Patient will benefit from skilled therapeutic intervention in order to improve the following deficits and impairments:  Decreased endurance, Decreased activity tolerance, Decreased strength, Pain, Increased fascial restricitons, Increased muscle spasms, Decreased mobility, Decreased range of motion, Impaired perceived functional ability, Impaired flexibility  Visit Diagnosis: Pain in right leg  Pain in left leg  Muscle weakness (generalized)  Acute midline low back pain, unspecified whether sciatica present     Problem List Patient Active Problem List   Diagnosis Date Noted  . Current smoker 07/19/2018  . Constipation  07/19/2018  . B12 deficiency 07/19/2018  . Restless legs 07/19/2018  . Ingrown toenail 12/20/2016  . History of adenomatous polyp of colon 09/23/2016  . Migraines 05/06/2016  . Family history of cancer of GI tract 12/06/2012  . HTN (hypertension) 11/21/2011  . MENORRHAGIA 09/23/2009  . PLANTAR FASCIITIS 09/20/2007  . Osteoarthritis 09/19/2006  . Depression 09/06/2006  . Allergic  rhinitis 09/06/2006  . GERD 09/06/2006   Scot Jun, PT, DPT, OCS, ATC 11/22/19  3:53 PM    [facility] [address] [place], [place], [address] Phone: [phone]   Fax:  [phone]  Name: Tonya Suarez MRN: [id number] Date of Birth: Nov 02, 1970

## 2019-11-26 ENCOUNTER — Encounter: Payer: BC Managed Care – PPO | Admitting: Rehabilitative and Restorative Service Providers"

## 2019-11-28 ENCOUNTER — Other Ambulatory Visit: Payer: Self-pay

## 2019-11-28 ENCOUNTER — Encounter: Payer: Self-pay | Admitting: Family Medicine

## 2019-11-28 ENCOUNTER — Ambulatory Visit (INDEPENDENT_AMBULATORY_CARE_PROVIDER_SITE_OTHER): Payer: BC Managed Care – PPO | Admitting: Family Medicine

## 2019-11-28 VITALS — BP 102/76 | HR 74 | Ht 69.0 in | Wt 248.6 lb

## 2019-11-28 DIAGNOSIS — G5701 Lesion of sciatic nerve, right lower limb: Secondary | ICD-10-CM | POA: Diagnosis not present

## 2019-11-28 DIAGNOSIS — M7061 Trochanteric bursitis, right hip: Secondary | ICD-10-CM

## 2019-11-28 DIAGNOSIS — M5417 Radiculopathy, lumbosacral region: Secondary | ICD-10-CM | POA: Diagnosis not present

## 2019-11-28 MED ORDER — DICLOFENAC SODIUM 1 % EX GEL: 4.0000 g | Freq: Four times a day (QID) | CUTANEOUS | 11 refills | Status: AC

## 2019-11-28 NOTE — Patient Instructions (Addendum)
Thank you for coming in today.  Please call [facility] at [phone] to schedule your spine injection.   Let me know if you do not improve following the shot. We could also consider L2 nerve root as well.   Ok to use the voltaren gel (diclofenac gel).

## 2019-11-28 NOTE — Progress Notes (Signed)
I, [name], LAT, ATC, am serving as scribe for Dr. [name].  Tonya Suarez is a 49 y.o. female who presents to [facility] today for f/u of LBP and R LE pain.  She was last seen by Dr. Georgina Snell on 10/02/19 w/ worsening R LE symptoms and noted pain running from her R glute to her R ankle.  Her symptoms seem to be worse and night/bedtime and when laying on her R side.  She was referred to PT and has completed 2 visits.  Since her last visit, pt reports that her LBP and R leg is feeling worse in the sense that she is having pain during the day which is new in addition to pain at night.  She also notes that her L leg is bothering her again.  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  She notes bilateral hip pain.  Additionally she also notes some right leg radiating pain down the posterior aspect of the leg to the foot.  Diagnostic testing:L-spine MRI- 02/18/19; L-spine XR- 01/25/19   Pertinent review of systems: No fevers or chills  Relevant historical information: Hypertension   Exam:  BP 102/76 (BP Location: Right Arm, Patient Position: Sitting, Cuff Size: Large)   Pulse 74   Ht 5\' 9"  (1.753 m)   Wt 248 lb 9.6 oz (112.8 kg)   LMP 06/05/2018   SpO2 96%   BMI 36.71 kg/m  General: Well Developed, well nourished, and in no acute distress.   MSK: L-spine normal-appearing nontender normal motion. Hips bilaterally normal-appearing tender palpation greater trochanter.  Hip abduction strength diminished 4/5 right 4+/5 left.  External rotation 4/5 bilaterally. Mild antalgic gait.     Lab and Radiology Results: MRI L-spine dated February 19, 2019 IMPRESSION: 1. Left subarticular disc extrusion at L5-S1, impinging upon the descending left S1 nerve root in the left lateral recess. 2. Shallow left foraminal/extraforaminal disc protrusion at L2-3, contacting and potentially irritating the  exiting left L2 nerve root. 3. Mild disc bulging with facet hypertrophy at L4-5 with resultant mild left lateral recess stenosis.   Electronically Signed   By: Jeannine Boga M.D.   On: 02/19/2019 03:57  I, [name], personally (independently) visualized and performed the interpretation of the images attached in this note.     Assessment and Plan: 49 y.o. female with lumbar sacral radiculopathy.  Patient had similar episode back in December 2020 in January 2021 treated with epidural steroid injection.  The left-sided symptoms have returned and now are occurring with right-sided S1 radicular symptoms.  This does not exactly correspond to MRI from December 2020.  Regardless we will proceed with repeat epidural steroid injection favor interlaminar approach at this time versus transforaminal.  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  Recheck back as needed.  Refilled Voltaren gel which she does find it may have been helpful.   PDMP not reviewed this encounter. Orders Placed This Encounter  Procedures  . DG INJECT DIAG/THERA/INC NEEDLE/CATH/PLC EPI/LUMB/SAC W/IMG    Standing Status:   Future    Standing Expiration Date:   11/27/2020    Order Specific Question:   Reason for Exam (SYMPTOM  OR DIAGNOSIS REQUIRED)    Answer:   BL S1 radiculopathy. Level and technique per radiology. Sugest L5-S1 interlaminar    Order Specific Question:   Is the patient pregnant?    Answer:   No    Order  Specific Question:   Preferred Imaging Location?    Answer:   [id number]    Order Specific Question:   Radiology Contrast Protocol - do NOT remove file path    Answer:   [email or web address]   Meds ordered this encounter  Medications  . diclofenac Sodium (VOLTAREN) 1 % GEL    Sig: Apply 4 g topically 4 (four) times daily. To affected joint.    Dispense:  100 g    Refill:  11      Discussed warning signs or symptoms. Please see discharge instructions. Patient expresses understanding.   The above documentation has been reviewed and is accurate and complete [name], M.D.

## 2019-12-02 ENCOUNTER — Encounter: Payer: Self-pay | Admitting: Rehabilitative and Restorative Service Providers"

## 2019-12-02 ENCOUNTER — Other Ambulatory Visit: Payer: Self-pay

## 2019-12-02 ENCOUNTER — Ambulatory Visit (INDEPENDENT_AMBULATORY_CARE_PROVIDER_SITE_OTHER): Payer: BC Managed Care – PPO | Admitting: Rehabilitative and Restorative Service Providers"

## 2019-12-02 DIAGNOSIS — M6281 Muscle weakness (generalized): Secondary | ICD-10-CM | POA: Diagnosis not present

## 2019-12-02 DIAGNOSIS — M79605 Pain in left leg: Secondary | ICD-10-CM

## 2019-12-02 DIAGNOSIS — M545 Low back pain, unspecified: Secondary | ICD-10-CM | POA: Diagnosis not present

## 2019-12-02 DIAGNOSIS — M79604 Pain in right leg: Secondary | ICD-10-CM

## 2019-12-02 NOTE — Therapy (Signed)
[facility] [address] [place], [place], [address] Phone: [phone]   Fax:  [phone]  Physical Therapy Treatment  Patient Details  Name: Tonya Suarez MRN: [id number] Date of Birth: 1970-11-19 Referring Provider (PT): Dr. [name]   Encounter Date: 12/02/2019   PT End of Session - 12/02/19 1609    Visit Number 3    Number of Visits 12    Date for PT Re-Evaluation 12/19/19    Progress Note Due on Visit 10    PT Start Time 1600    PT Stop Time 1639    PT Time Calculation (min) 39 min    Activity Tolerance Patient tolerated treatment well    Behavior During Therapy [facility] for tasks assessed/performed           Past Medical History:  Diagnosis Date  . Allergy   . Arthritis   . Chronic headaches   . Depression   . GERD (gastroesophageal reflux disease)   . Hypertension   . Low back pain   . UTI (urinary tract infection)     Past Surgical History:  Procedure Laterality Date  . TUBAL LIGATION      There were no vitals filed for this visit.   Subjective Assessment - 12/02/19 1608    Subjective Pt. reported feeling no pain really today.  Pt. stated last week had pain, maybe due to weather but doing better today.    Limitations Sitting   sleep   How long can you sit comfortably? 5-10    How long can you stand comfortably? 1 hour    How long can you walk comfortably? 1 hour (worse after sitting prolonged then improves)    Diagnostic tests MRI    Patient Stated Goals Reduce pain, sleep better    Currently in Pain? No/denies    Pain Score 0-No pain    Pain Onset More than a month ago                             [facility] Adult PT Treatment/Exercise - 12/02/19 0001      Lumbar Exercises: Stretches   Piriformis Stretch 3 reps;30 seconds;Left;Right      Lumbar Exercises: Aerobic   Nustep lvl 6 10 mins      Lumbar Exercises: Supine   Bridge 20 reps;5 seconds   blue band hip abd hold     Lumbar  Exercises: Sidelying   Other Sidelying Lumbar Exercises reverse clam blue band 3 x10      Lumbar Exercises: Quadruped   Opposite Arm/Leg Raise Left arm/Right leg;Right arm/Left leg;10 reps   15 x bilateral   Other Quadruped Lumbar Exercises fire hydrant 2 x 15 bilateral      Manual Therapy   Manual therapy comments percussive device Rt glute med, max, IT band proximal and middle third                    PT Short Term Goals - 12/02/19 1620      PT SHORT TERM GOAL #1   Title Patient will demonstrate independent use of home exercise program to maintain progress from in clinic treatments.    Time 2    Period Weeks    Status Achieved    Target Date 12/06/19             PT Long Term Goals - 10/24/19 1645      PT LONG TERM GOAL #  1   Title Patient will demonstrate/report pain at worst less than or equal to 2/10 to facilitate minimal limitation in daily activity secondary to pain symptoms.    Time 8    Period Weeks    Status New    Target Date 12/19/19      PT LONG TERM GOAL #2   Title Patient will demonstrate independent use of home exercise program to facilitate ability to maintain/progress functional gains from skilled physical therapy services.    Time 8    Period Weeks    Status New    Target Date 12/19/19      PT LONG TERM GOAL #3   Title Pt. will demonstrate bilateral hip MMT 5/5 throughout to facilitate stability in daily movement.    Time 8    Period Weeks    Status New    Target Date 12/19/19      PT LONG TERM GOAL #4   Title Pt. will demonstrate Rt passive SLR equal to Lt for normalized mobility.    Time 8    Period Weeks    Status New    Target Date 12/19/19      PT LONG TERM GOAL #5   Title Pt. will demonstrate/report ability to sleep, sit during daily activity at PLOF.    Time 8    Period Weeks    Status New    Target Date 12/19/19                 Plan - 12/02/19 1609    Clinical Impression Statement Continued reduction in point  tenderness reported today.  Pt. to benefit from continued strengthening and endurance improvement for core and hips at this time.    Personal Factors and Comorbidities Comorbidity 3+    Comorbidities depression, gerd, htn,    Examination-Activity Limitations Sleep;Sit;Squat;Transfers    Examination-Participation Restrictions Community Activity;Occupation;Other   sleep   Stability/Clinical Decision Making Stable/Uncomplicated    Rehab Potential Good    PT Frequency --   1-2x/week   PT Duration 8 weeks    PT Treatment/Interventions ADLs/Self Care Home Management;Cryotherapy;Electrical Stimulation;Iontophoresis 4mg /ml Dexamethasone;Moist Heat;Traction;Balance training;Therapeutic exercise;Therapeutic activities;Functional mobility training;Stair training;Gait training;Ultrasound;Neuromuscular re-education;Patient/family education;Manual techniques;Taping;Dry needling;Passive range of motion;Spinal Manipulations;Joint Manipulations    PT Next Visit Plan DN/soft tissue treatment prn, hip/core strength    PT Home Exercise Plan [facility]    Consulted and Agree with Plan of Care Patient           Patient will benefit from skilled therapeutic intervention in order to improve the following deficits and impairments:  Decreased endurance, Decreased activity tolerance, Decreased strength, Pain, Increased fascial restricitons, Increased muscle spasms, Decreased mobility, Decreased range of motion, Impaired perceived functional ability, Impaired flexibility  Visit Diagnosis: Pain in right leg  Pain in left leg  Muscle weakness (generalized)  Acute midline low back pain, unspecified whether sciatica present     Problem List Patient Active Problem List   Diagnosis Date Noted  . Current smoker 07/19/2018  . Constipation 07/19/2018  . B12 deficiency 07/19/2018  . Restless legs 07/19/2018  . Ingrown toenail 12/20/2016  . History of adenomatous polyp of colon 09/23/2016  . Migraines 05/06/2016  .  Family history of cancer of GI tract 12/06/2012  . HTN (hypertension) 11/21/2011  . MENORRHAGIA 09/23/2009  . PLANTAR FASCIITIS 09/20/2007  . Osteoarthritis 09/19/2006  . Depression 09/06/2006  . Allergic rhinitis 09/06/2006  . GERD 09/06/2006    Scot Jun, PT, DPT, OCS, ATC 12/02/19  4:24 PM  [facility] [address] [place], [place], [address] Phone: [phone]   Fax:  [phone]  Name: Nimisha Rathel MRN: [id number] Date of Birth: 12/06/1970

## 2019-12-06 ENCOUNTER — Other Ambulatory Visit: Payer: Self-pay

## 2019-12-06 ENCOUNTER — Ambulatory Visit
Admission: RE | Admit: 2019-12-06 | Discharge: 2019-12-06 | Disposition: A | Discharge status: Home / Self Care | Payer: BC Managed Care – PPO | Source: Ambulatory Visit | Attending: Family Medicine | Admitting: Family Medicine

## 2019-12-06 DIAGNOSIS — M47817 Spondylosis without myelopathy or radiculopathy, lumbosacral region: Secondary | ICD-10-CM | POA: Diagnosis not present

## 2019-12-06 DIAGNOSIS — M5417 Radiculopathy, lumbosacral region: Secondary | ICD-10-CM

## 2019-12-06 MED ORDER — METHYLPREDNISOLONE ACETATE 40 MG/ML INJ SUSP (RADIOLOG
120.0000 mg | Freq: Once | INTRAMUSCULAR | Status: AC
  Administered 2019-12-06: 120 mg via EPIDURAL

## 2019-12-06 MED ORDER — IOPAMIDOL (ISOVUE-M 200) INJECTION 41%
1.0000 mL | Freq: Once | INTRAMUSCULAR | Status: AC
  Administered 2019-12-06: 1 mL via EPIDURAL

## 2019-12-06 NOTE — Discharge Instructions (Signed)

## 2019-12-10 ENCOUNTER — Other Ambulatory Visit: Payer: Self-pay

## 2019-12-10 ENCOUNTER — Encounter: Payer: Self-pay | Admitting: Rehabilitative and Restorative Service Providers"

## 2019-12-10 ENCOUNTER — Ambulatory Visit: Payer: BC Managed Care – PPO | Admitting: Rehabilitative and Restorative Service Providers"

## 2019-12-10 DIAGNOSIS — M6281 Muscle weakness (generalized): Secondary | ICD-10-CM

## 2019-12-10 DIAGNOSIS — M79604 Pain in right leg: Secondary | ICD-10-CM

## 2019-12-10 DIAGNOSIS — M545 Low back pain, unspecified: Secondary | ICD-10-CM

## 2019-12-10 DIAGNOSIS — M79605 Pain in left leg: Secondary | ICD-10-CM | POA: Diagnosis not present

## 2019-12-10 NOTE — Therapy (Addendum)
[facility] Physical Therapy [address] [place], [place], [address] Phone: [phone]   Fax:  [phone]  Physical Therapy Treatment/Progress Note/Recert/Discharge  Patient Details  Name: Tonya Suarez MRN: [id number] Date of Birth: 03/28/1970 Referring Provider (PT): Dr. [name]   Encounter Date: 12/10/2019   Progress Note Reporting Period 10/24/2019 to 12/10/2019  See note below for Objective Data and Assessment of Progress/Goals.   PHYSICAL THERAPY DISCHARGE SUMMARY  Visits from Start of Care: 5  Current functional level related to goals / functional outcomes: See note   Remaining deficits: See note   Education / Equipment:  Plan: Patient agrees to discharge.  Patient goals were partially met. Patient is being discharged due to not returning since the last visit.  ?????           PT End of Session - 12/10/19 1605    Visit Number 4    Number of Visits 12    Date for PT Re-Evaluation 12/19/19    Progress Note Due on Visit 10    PT Start Time 1600    PT Stop Time 1638    PT Time Calculation (min) 38 min    Activity Tolerance Patient tolerated treatment well    Behavior During Therapy WFL for tasks assessed/performed           Past Medical History:  Diagnosis Date  . Allergy   . Arthritis   . Chronic headaches   . Depression   . GERD (gastroesophageal reflux disease)   . Hypertension   . Low back pain   . UTI (urinary tract infection)     Past Surgical History:  Procedure Laterality Date  . TUBAL LIGATION      There were no vitals filed for this visit.   Subjective Assessment - 12/10/19 1603    Subjective Pt. reported having epidural on Friday into Rt side with uncertain full result analysis yet (still waiting for a few more days to assess).  Pt. stated no pain upon arrival today.  Felt like Rt hip was still weaker but maybe improving some.  Pt. stated having improvement in stiffness c sitting.  GROC at  somewhat better +3 at this time.    Limitations Sitting   sleep   How long can you sit comfortably? 5-10    How long can you stand comfortably? 1 hour    How long can you walk comfortably? 1 hour (worse after sitting prolonged then improves)    Diagnostic tests MRI    Patient Stated Goals Reduce pain, sleep better    Currently in Pain? No/denies    Pain Score --   at worst during day 2/10, 5/10 at night   Pain Onset More than a month ago              [facility] PT Assessment - 12/10/19 0001      Assessment   Medical Diagnosis Piriformis Rt side    Referring Provider (PT) Dr. [name]    Onset Date/Surgical Date 02/22/19    Hand Dominance Right      AROM   Lumbar Flexion to toes, no complaint    Lumbar Extension 100% c mild tightness,  REIS x 5 reduced complaints    Lumbar - Right Side Bend lateral knee jt no complaint    Lumbar - Left Side Bend lateral knee jt no complaint      Strength   Right Hip Extension 5/5    Right Hip ABduction 3+/5  Left Hip ABduction 4+/5                         [facility] Adult PT Treatment/Exercise - 12/10/19 0001      Lumbar Exercises: Aerobic   Nustep Lvl 6 12 mins      Lumbar Exercises: Machines for Strengthening   Leg Press shuttle leg press 3 x 15 50 lbs      Lumbar Exercises: Standing   Other Standing Lumbar Exercises lumbar extension standing x 5      Lumbar Exercises: Sidelying   Hip Abduction Right   3 x 10 back on wall   Other Sidelying Lumbar Exercises reverse clam blue band 3 x10                    PT Short Term Goals - 12/02/19 1620      PT SHORT TERM GOAL #1   Title Patient will demonstrate independent use of home exercise program to maintain progress from in clinic treatments.    Time 2    Period Weeks    Status Achieved    Target Date 12/06/19             PT Long Term Goals - 12/10/19 1609      PT LONG TERM GOAL #1   Title Patient will demonstrate/report pain at worst less than or  equal to 2/10 to facilitate minimal limitation in daily activity secondary to pain symptoms.    Time 6    Period Weeks    Status On-going    Target Date 01/21/20      PT LONG TERM GOAL #2   Title Patient will demonstrate independent use of home exercise program to facilitate ability to maintain/progress functional gains from skilled physical therapy services.    Time 6    Period Weeks    Status On-going    Target Date 01/21/20      PT LONG TERM GOAL #3   Title Pt. will demonstrate bilateral hip MMT 5/5 throughout to facilitate stability in daily movement.    Time 6    Period Weeks    Status Partially Met    Target Date 01/21/20      PT LONG TERM GOAL #4   Title Pt. will demonstrate Rt passive SLR equal to Lt for normalized mobility.    Time 6    Period Weeks    Status On-going    Target Date 01/21/20      PT LONG TERM GOAL #5   Title Pt. will demonstrate/report ability to sleep, sit during daily activity at PLOF.    Time 6    Period Weeks    Status On-going    Target Date 01/21/20                 Plan - 12/10/19 1610    Clinical Impression Statement Pt. has attended 4 visits during course of treatment to this point.  See objective data for updated information regarding current presentation. GROC +3 somewhat better at this time.  Pt. has self reported improvement in all areas, with complaints still noted (though improving) in sitting complaints and nighttime complaints.  Recommend continued skilled PT services at this time to continue towards goals.    Personal Factors and Comorbidities Comorbidity 3+    Comorbidities depression, gerd, htn,    Examination-Activity Limitations Sleep;Sit;Squat;Transfers    Examination-Participation Restrictions Community Activity;Occupation;Other   sleep   Stability/Clinical Decision Making Stable/Uncomplicated  Rehab Potential Good    PT Frequency --   1-2x/week   PT Duration 6 weeks    PT Treatment/Interventions ADLs/Self Care  Home Management;Cryotherapy;Electrical Stimulation;Iontophoresis 4mg /ml Dexamethasone;Moist Heat;Traction;Balance training;Therapeutic exercise;Therapeutic activities;Functional mobility training;Stair training;Gait training;Ultrasound;Neuromuscular re-education;Patient/family education;Manual techniques;Taping;Dry needling;Passive range of motion;Spinal Manipulations;Joint Manipulations    PT Next Visit Plan DN/soft tissue treatment prn, hip/core strength    PT Home Exercise Plan [facility]    Consulted and Agree with Plan of Care Patient           Patient will benefit from skilled therapeutic intervention in order to improve the following deficits and impairments:  Decreased endurance, Decreased activity tolerance, Decreased strength, Pain, Increased fascial restricitons, Increased muscle spasms, Decreased mobility, Decreased range of motion, Impaired perceived functional ability, Impaired flexibility  Visit Diagnosis: Pain in right leg  Pain in left leg  Muscle weakness (generalized)  Acute midline low back pain, unspecified whether sciatica present     Problem List Patient Active Problem List   Diagnosis Date Noted  . Current smoker 07/19/2018  . Constipation 07/19/2018  . B12 deficiency 07/19/2018  . Restless legs 07/19/2018  . Ingrown toenail 12/20/2016  . History of adenomatous polyp of colon 09/23/2016  . Migraines 05/06/2016  . Family history of cancer of GI tract 12/06/2012  . HTN (hypertension) 11/21/2011  . MENORRHAGIA 09/23/2009  . PLANTAR FASCIITIS 09/20/2007  . Osteoarthritis 09/19/2006  . Depression 09/06/2006  . Allergic rhinitis 09/06/2006  . GERD 09/06/2006    Scot Jun, PT, DPT, OCS, ATC 12/10/19  4:34 PM   D/C:  Scot Jun, PT, DPT, OCS, ATC 01/13/20  3:53 PM       [facility] Physical Therapy [address] [place], [place], [address] Phone: [phone]   Fax:  [phone]  Name: Tonya Suarez MRN:  [id number] Date of Birth: 1970/05/19

## 2019-12-25 ENCOUNTER — Encounter: Payer: BC Managed Care – PPO | Admitting: Rehabilitative and Restorative Service Providers"

## 2019-12-30 ENCOUNTER — Encounter: Payer: BC Managed Care – PPO | Admitting: Rehabilitative and Restorative Service Providers"

## 2019-12-30 ENCOUNTER — Telehealth: Payer: Self-pay | Admitting: Rehabilitative and Restorative Service Providers"

## 2019-12-30 NOTE — Telephone Encounter (Signed)
Called and left voice message about appointment.

## 2019-12-31 DIAGNOSIS — Z23 Encounter for immunization: Secondary | ICD-10-CM | POA: Diagnosis not present

## 2020-01-09 ENCOUNTER — Encounter: Payer: BC Managed Care – PPO | Admitting: Rehabilitative and Restorative Service Providers"

## 2020-01-09 ENCOUNTER — Telehealth: Payer: Self-pay | Admitting: Rehabilitative and Restorative Service Providers"

## 2020-01-09 NOTE — Telephone Encounter (Signed)
Called and left message about missed appointment and plan to cancel existing additional appointments until Pt. Returns contact to schedule in future.  Scot Jun, PT, DPT, OCS, ATC 01/09/20  4:16 PM

## 2020-01-13 ENCOUNTER — Encounter: Payer: BC Managed Care – PPO | Admitting: Rehabilitative and Restorative Service Providers"

## 2020-01-24 ENCOUNTER — Encounter: Payer: BC Managed Care – PPO | Admitting: Rehabilitative and Restorative Service Providers"

## 2020-01-27 ENCOUNTER — Telehealth (INDEPENDENT_AMBULATORY_CARE_PROVIDER_SITE_OTHER): Payer: BC Managed Care – PPO | Admitting: Physician Assistant

## 2020-01-27 ENCOUNTER — Encounter: Payer: Self-pay | Admitting: Physician Assistant

## 2020-01-27 VITALS — BP 142/85 | HR 103 | Temp 98.5°F | Ht 69.0 in | Wt 242.6 lb

## 2020-01-27 DIAGNOSIS — R319 Hematuria, unspecified: Secondary | ICD-10-CM

## 2020-01-27 DIAGNOSIS — R300 Dysuria: Secondary | ICD-10-CM | POA: Diagnosis not present

## 2020-01-27 LAB — POCT URINALYSIS DIPSTICK
Bilirubin, UA: NEGATIVE
Glucose, UA: NEGATIVE
Ketones, UA: NEGATIVE
Nitrite, UA: NEGATIVE
Protein, UA: NEGATIVE
Spec Grav, UA: 1.01 (ref 1.010–1.025)
Urobilinogen, UA: 0.2 E.U./dL
pH, UA: 5.5 (ref 5.0–8.0)

## 2020-01-27 MED ORDER — NITROFURANTOIN MONOHYD MACRO 100 MG PO CAPS: 100.0000 mg | ORAL_CAPSULE | Freq: Two times a day (BID) | ORAL | 0 refills | Status: DC

## 2020-01-27 NOTE — Patient Instructions (Signed)
It was great to see you!  Start macrobid for your UTI.  Please return after two weeks for a urine ONLY test to make sure there is no more blood in your urine.  If you are not feeling better in 2-3 days, please return.  General instructions  Make sure you: ? Pee until your bladder is empty. ? Do not hold pee for a long time. ? Empty your bladder after sex. ? Wipe from front to back after pooping if you are a female. Use each tissue one time when you wipe.  Drink enough fluid to keep your pee pale yellow.  Keep all follow-up visits as told by your doctor. This is important. Contact a doctor if:  You do not get better after 1-2 days.  Your symptoms go away and then come back. Get help right away if:  You have very bad back pain.  You have very bad pain in your lower belly.  You have a fever.  You are sick to your stomach (nauseous).  You are throwing up.   Take care,  Inda Coke PA-C

## 2020-01-27 NOTE — Progress Notes (Signed)
Tonya Suarez is a 49 y.o. female here for a new problem.  History of Present Illness:   Chief Complaint  Patient presents with  . Blood in urine    has been drinking water, States that cleared up the blood. xtoday  . Urinary Frequency    but when pt goes to th bathroom she states it's only a couple drops xtoday  . Urinary Discomfort    pt states that it doesn't burn when she urinates, but it doesn't feel normal hard to explain xtoday     HPI  UTI Symptoms History of UTI but has not had one in about 5-6 years. Had blood after wiping today. Denies vaginal discharge, vaginal bleeding, concerns for STDs. Reports she has been drinking more sodas recently.  Did have some slight pain with sex on Friday.  Denies: fevers, chills, worsening back pain, nausea, vomiting, malaise  She is a current smoker  Patient's last menstrual period was 06/05/2018.   Past Medical History:  Diagnosis Date  . Allergy   . Arthritis   . Chronic headaches   . Depression   . GERD (gastroesophageal reflux disease)   . Hypertension   . Low back pain   . UTI (urinary tract infection)      Social History   Tobacco Use  . Smoking status: Current Some Day Smoker    Types: Cigars  . Smokeless tobacco: Never Used  . Tobacco comment: black and mild one per day  Vaping Use  . Vaping Use: Never used  Substance Use Topics  . Alcohol use: Yes    Alcohol/week: 0.0 - 2.0 standard drinks    Comment: occ  . Drug use: No    Past Surgical History:  Procedure Laterality Date  . TUBAL LIGATION      Family History  Problem Relation Age of Onset  . Diabetes Father        mother  . Hyperlipidemia Father        mother  . Hypertension Father        mother  . Colon cancer Father        19s, and grandmother  . Lung cancer Father        in 42s  . Cancer - Other Sister        duodenal  . Stomach cancer Sister   . Diabetes Mother   . Hypertension Mother   . Hyperlipidemia Mother   . Kidney  disease Mother        has fistula- could need dialysis at any time  . Colon cancer Paternal Grandmother   . Colon cancer Maternal Aunt   . Esophageal cancer Neg Hx   . Liver cancer Neg Hx   . Pancreatic cancer Neg Hx   . Rectal cancer Neg Hx     Allergies  Allergen Reactions  . Amoxicillin Swelling    Lip swelling    Current Medications:   Current Outpatient Medications:  .  albuterol (VENTOLIN HFA) 108 (90 Base) MCG/ACT inhaler, TAKE 2 PUFFS BY MOUTH EVERY 6 HOURS AS NEEDED, Disp: 8.5 g, Rfl: 2 .  amLODipine (NORVASC) 2.5 MG tablet, TAKE 1 TABLET BY MOUTH EVERY DAY, Disp: 90 tablet, Rfl: 1 .  azelastine (ASTELIN) 0.1 % nasal spray, Place 2 sprays into both nostrils 2 (two) times daily., Disp: 30 mL, Rfl: 12 .  cyclobenzaprine (FLEXERIL) 5 MG tablet, Take 1 tablet (5 mg total) by mouth 3 (three) times daily as needed for muscle spasms., Disp:  30 tablet, Rfl: 1 .  diclofenac Sodium (VOLTAREN) 1 % GEL, Apply 4 g topically 4 (four) times daily. To affected joint., Disp: 100 g, Rfl: 11 .  fluticasone (FLONASE) 50 MCG/ACT nasal spray, SPRAY 2 SPRAYS INTO EACH NOSTRIL EVERY DAY, Disp: 48 mL, Rfl: 2 .  gabapentin (NEURONTIN) 300 MG capsule, PLEASE SEE ATTACHED FOR DETAILED DIRECTIONS, Disp: , Rfl:  .  hydrochlorothiazide (HYDRODIURIL) 25 MG tablet, TAKE 1 TABLET BY MOUTH EVERY DAY, Disp: 90 tablet, Rfl: 1 .  meloxicam (MOBIC) 7.5 MG tablet, Take 1 tablet (7.5 mg total) by mouth daily as needed for pain., Disp: 90 tablet, Rfl: 1 .  omeprazole (PRILOSEC) 40 MG capsule, TAKE 1 CAPSULE BY MOUTH EVERY DAY;, Disp: 90 capsule, Rfl: 3 .  benzonatate (TESSALON) 100 MG capsule, Take 1 capsule (100 mg total) by mouth 2 (two) times daily as needed for cough. (Patient not taking: Reported on 01/27/2020), Disp: 20 capsule, Rfl: 0 .  nitrofurantoin, macrocrystal-monohydrate, (MACROBID) 100 MG capsule, Take 1 capsule (100 mg total) by mouth 2 (two) times daily., Disp: 10 capsule, Rfl: 0 .  traMADol (ULTRAM)  50 MG tablet, Take 1-2 tablets (50-100 mg total) by mouth every 6 (six) hours as needed. (Patient not taking: Reported on 01/27/2020), Disp: 25 tablet, Rfl: 0   Review of Systems:   ROS  Negative unless otherwise specified per HPI.  Vitals:   Vitals:   01/27/20 1617  BP: (!) 142/85  Pulse: (!) 103  Temp: 98.5 F (36.9 C)  TempSrc: Temporal  SpO2: 98%  Weight: 242 lb 9.6 oz (110 kg)  Height: 5\' 9"  (1.753 m)     Body mass index is 35.83 kg/m.  Physical Exam:   Physical Exam Vitals and nursing note reviewed.  Constitutional:      General: She is not in acute distress.    Appearance: She is well-developed. She is not ill-appearing or toxic-appearing.  Cardiovascular:     Rate and Rhythm: Normal rate and regular rhythm.     Pulses: Normal pulses.     Heart sounds: Normal heart sounds, S1 normal and S2 normal.     Comments: No LE edema Pulmonary:     Effort: Pulmonary effort is normal.     Breath sounds: Normal breath sounds.  Abdominal:     General: Abdomen is flat.     Tenderness: There is no right CVA tenderness or left CVA tenderness.  Skin:    General: Skin is warm and dry.  Neurological:     Mental Status: She is alert.     GCS: GCS eye subscore is 4. GCS verbal subscore is 5. GCS motor subscore is 6.  Psychiatric:        Speech: Speech normal.        Behavior: Behavior normal. Behavior is cooperative.     Results for orders placed or performed in visit on 01/27/20  POCT Urinalysis Dipstick  Result Value Ref Range   Color, UA yellow    Clarity, UA clear    Glucose, UA Negative Negative   Bilirubin, UA Negative    Ketones, UA Negative    Spec Grav, UA 1.010 1.010 - 1.025   Blood, UA 3+    pH, UA 5.5 5.0 - 8.0   Protein, UA Negative Negative   Urobilinogen, UA 0.2 0.2 or 1.0 E.U./dL   Nitrite, UA Negative    Leukocytes, UA Small (1+) (A) Negative   Appearance clear    Odor none  Assessment and Plan:   Maddelyn was seen today for blood in urine,  urinary frequency and urinary discomfort.  Diagnoses and all orders for this visit:  Dysuria UA concerning for infection. Will treat with oral macrobid per orders. Urine culture pending. Follow-up if lack of improvement or concerns. -     POCT Urinalysis Dipstick -     Urine Culture  Hematuria Recommend follow-up after two weeks for repeat UA for further evaluation. If hematuria persists, will refer to urology.  Other orders -     nitrofurantoin, macrocrystal-monohydrate, (MACROBID) 100 MG capsule; Take 1 capsule (100 mg total) by mouth 2 (two) times daily.  Inda Coke, PA-C

## 2020-01-27 NOTE — Addendum Note (Signed)
Addended by: Mliss Sax on: 01/27/2020 04:43 PM   Modules accepted: Orders

## 2020-01-28 ENCOUNTER — Other Ambulatory Visit: Payer: Self-pay | Admitting: Physician Assistant

## 2020-01-28 DIAGNOSIS — R319 Hematuria, unspecified: Secondary | ICD-10-CM

## 2020-01-29 LAB — URINE CULTURE
MICRO NUMBER:: 11280950
SPECIMEN QUALITY:: ADEQUATE

## 2020-01-29 LAB — URINALYSIS, ROUTINE W REFLEX MICROSCOPIC

## 2020-01-30 ENCOUNTER — Encounter: Payer: BC Managed Care – PPO | Admitting: Rehabilitative and Restorative Service Providers"

## 2020-02-02 ENCOUNTER — Other Ambulatory Visit: Payer: Self-pay | Admitting: Family Medicine

## 2020-02-03 NOTE — Telephone Encounter (Signed)
Pt is requesting a refill of Meloxicam

## 2020-02-12 ENCOUNTER — Other Ambulatory Visit: Payer: BC Managed Care – PPO

## 2020-02-13 ENCOUNTER — Other Ambulatory Visit: Payer: BC Managed Care – PPO

## 2020-02-23 ENCOUNTER — Other Ambulatory Visit: Payer: Self-pay | Admitting: Internal Medicine

## 2020-02-23 DIAGNOSIS — K219 Gastro-esophageal reflux disease without esophagitis: Secondary | ICD-10-CM

## 2020-03-02 NOTE — Progress Notes (Signed)
I, Peterson Lombard, LAT, ATC acting as a scribe for [name], MD.  Tonya Suarez is a 50 y.o. female who presents to [facility] today for f/u lumbosacral radiculopathy, R GT bursitis, and R piriformis syndrome. Pt was last seen by Dr. Georgina Snell on 11/28/19 and was advised to continue PT, of which she's completed 5 visits and was referred for repeat epidural steroid injection that was done on 12/06/19. Today, pt locates pain to R superior buttock, to lateral aspect of hip and lateral thigh.  Radiates: yes LE numbness/tingling: sometimes LE weakness: yes Aggravates: varies in what increases pain Rx tried: PT- helped some, but was too expensive, massage ball, Voltaren gel, gabapentin  Dx imaging: 02/18/19 L-spine MRI  01/25/19 L-spine XR  Pertinent review of systems: No fevers or chills  Relevant historical information: Hypertension   Exam:  BP 110/70 (BP Location: Left Arm, Patient Position: Sitting, Cuff Size: Normal)   Pulse 76   Ht 5\' 9"  (1.753 m)   Wt 246 lb 6.4 oz (111.8 kg)   LMP 06/05/2018   SpO2 98%   BMI 36.39 kg/m  General: Well Developed, well nourished, and in no acute distress.   MSK: L-spine normal. Nontender midline. Decreased lumbar motion. Negative slump test.  Right hip normal. Tender palpation greater trochanter. Redo strength hip abduction 4/5.  Right SI joint tender palpation.  Some pain with SI joint rock.    Lab and Radiology Results  Procedure: Real-time Ultrasound Guided Injection of right lateral hip greater trochanter bursa Device: Philips Affiniti 50G Images permanently stored and available for review in PACS Examination prior to injection reveals moderate greater trochanter bursitis Verbal informed consent obtained.  Discussed risks and benefits of procedure. Warned about infection bleeding damage to structures skin hypopigmentation and fat atrophy among others. Patient expresses understanding and  agreement Time-out conducted.   Noted no overlying erythema, induration, or other signs of local infection.   Skin prepped in a sterile fashion.   Local anesthesia: Topical Ethyl chloride.   With sterile technique and under real time ultrasound guidance:  40 mg of Kenalog and 2 mL of Marcaine injected into bursa. Fluid seen entering the bursa.   Completed without difficulty   Pain immediately resolved suggesting accurate placement of the medication.   Advised to call if fevers/chills, erythema, induration, drainage, or persistent bleeding.   Images permanently stored and available for review in the ultrasound unit.  Impression: Technically successful ultrasound guided injection.          Assessment and Plan: 50 y.o. female with right hip and leg pain multifactorial.  It is possible patient does have some lumbosacral radiculopathy.  However her failure to improve with epidural steroid injection indicates this is somewhat less likely.  She does have lateral hip and buttocks pain thought to be due to true greater trochanteric bursitis.  Bursitis was seen on ultrasound examination prior to injection today.  Plan for greater trochanter injection and home exercise program as taught in clinic today by ATC.  SI joint could also be a source of pain and if this injection does not provide significant benefit would proceed with trial of SI joint injection.  Ultimately may require advanced imaging including MRI of the lumbar spine and/or hip.   PDMP not reviewed this encounter. Orders Placed This Encounter  Procedures  . [place] LIMITED JOINT SPACE STRUCTURES LOW RIGHT(NO LINKED CHARGES)    Standing Status:   Future    Number of Occurrences:  1    Standing Expiration Date:   08/31/2020    Order Specific Question:   Reason for Exam (SYMPTOM  OR DIAGNOSIS REQUIRED)    Answer:   chronic right hip pain    Order Specific Question:   Preferred imaging location?    Answer:   [place]   Meds ordered this encounter  Medications  . gabapentin (NEURONTIN) 300 MG capsule    Sig: Take 2 capsules (600 mg total) by mouth at bedtime.    Dispense:  180 capsule    Refill:  1     Discussed warning signs or symptoms. Please see discharge instructions. Patient expresses understanding.   The above documentation has been reviewed and is accurate and complete [name], M.D.

## 2020-03-02 NOTE — Patient Instructions (Addendum)
Please stop by lab before you go If you have mychart- we will send your results within 3 business days of [place] receiving them.  If you do not have mychart- we will call you about results within 5 business days of [place] receiving them.  *please also note that you will see labs on mychart as soon as they post. I will later go in and write notes on them- will say "notes from Dr. Yong Channel"  Shingrix #1 today. Repeat injection in 2-6 months. Schedule a nurse visit for the 2nd injection before you leave today (at the check out desk)  Please call [phone] to schedule a visit with [place] [facility] -Trey Paula is an excellent counselor who is based out of our clinic -if worsening symptoms and you want to reconsider meds let us know -if any thoughts of self harm contact us immediately  Recommended follow-up: Return in about 6 months (around 08/31/2020) for follow up- or sooner if needed. or we can do shingrix at this time

## 2020-03-02 NOTE — Progress Notes (Signed)
50 y.o. [id number] Married [place] female here for annual exam.     Still have some hot flashes (much better than last year) , concerns about low sex drive Has been married 3 years and difficulty communicating, is ready to start counseling. Has a name of a therapist.  Came from PCP office, had annual physical today with him.  Patient's last menstrual period was 06/05/2018.          Sexually active: Yes.    The current method of family planning is tubal ligation.    Exercising: No.  exercise Smoker:  no  Health Maintenance: Pap:  01-13-16 ASCUS HPV HR neg, 09-20-2018 ASCUS HPV HR neg History of abnormal Pap:  no MMG:  07-24-2019 category a density birads 1:neg Colonoscopy:  09-16-2016 polyps, f/u 35yrs BMD:  none TDaP:  2013 Gardasil:   n/a Covid-19: pfizer done 2021 Hep C testing: drawn today at pcp Screening Labs: drawn today   reports that she has quit smoking. Her smoking use included cigars. She has never used smokeless tobacco. She reports current alcohol use. She reports that she does not use drugs.  Past Medical History:  Diagnosis Date  . Allergy   . Arthritis   . Chronic headaches   . Depression   . GERD (gastroesophageal reflux disease)   . Hypertension   . Low back pain   . UTI (urinary tract infection)     Past Surgical History:  Procedure Laterality Date  . TUBAL LIGATION      Current Outpatient Medications  Medication Sig Dispense Refill  . amLODipine (NORVASC) 2.5 MG tablet Take 1 tablet (2.5 mg total) by mouth daily. 90 tablet 3  . azelastine (ASTELIN) 0.1 % nasal spray Place 2 sprays into both nostrils 2 (two) times daily. 30 mL 12  . diclofenac Sodium (VOLTAREN) 1 % GEL Apply 4 g topically 4 (four) times daily. To affected joint. 100 g 11  . fluticasone (FLONASE) 50 MCG/ACT nasal spray SPRAY 2 SPRAYS INTO EACH NOSTRIL EVERY DAY 48 mL 2  . gabapentin (NEURONTIN) 300 MG capsule PLEASE SEE ATTACHED FOR DETAILED DIRECTIONS    .  hydrochlorothiazide (HYDRODIURIL) 25 MG tablet Take 1 tablet (25 mg total) by mouth daily. 90 tablet 3  . meloxicam (MOBIC) 7.5 MG tablet Take 1 tablet (7.5 mg total) by mouth daily as needed. for pain 90 tablet 1  . omeprazole (PRILOSEC) 40 MG capsule TAKE 1 CAPSULE BY MOUTH EVERY DAY 90 capsule 3   No current facility-administered medications for this visit.    Family History  Problem Relation Age of Onset  . Diabetes Father        mother  . Hyperlipidemia Father        mother  . Hypertension Father        mother  . Colon cancer Father        51s, and grandmother  . Lung cancer Father        in 32s  . Cancer - Other Sister        duodenal  . Stomach cancer Sister   . Diabetes Mother   . Hypertension Mother   . Hyperlipidemia Mother   . Kidney disease Mother        has fistula- could need dialysis at any time  . Colon cancer Paternal Grandmother   . Colon cancer Maternal Aunt   . Esophageal cancer Neg Hx   . Liver cancer Neg Hx   . Pancreatic cancer Neg  Hx   . Rectal cancer Neg Hx     Review of Systems  Constitutional: Negative.   HENT: Negative.   Eyes: Negative.   Respiratory: Negative.   Cardiovascular: Negative.   Gastrointestinal: Negative.   Endocrine: Negative.   Genitourinary: Negative.   Musculoskeletal: Negative.   Skin: Negative.   Allergic/Immunologic: Negative.   Neurological: Negative.   Hematological: Negative.   Psychiatric/Behavioral: Negative.     Exam:   BP 118/80   Pulse 72   Resp 16   Ht 5\' 9"  (1.753 m)   Wt 243 lb (110.2 kg)   LMP 06/05/2018   BMI 35.88 kg/m   Height: 5\' 9"  (175.3 cm)  General appearance: alert, cooperative and appears stated age, no acute distress Head: Normocephalic, without obvious abnormality Neck: no adenopathy, thyroid normal to inspection and palpation Lungs: clear to auscultation bilaterally Breasts: normal appearance, no masses or tenderness Heart: regular rate and rhythm Abdomen: soft, non-tender; no  masses,  no organomegaly Extremities: extremities normal, no edema Skin: No rashes or lesions Lymph nodes: Cervical, supraclavicular, and axillary nodes normal. No abnormal inguinal nodes palpated Neurologic: Grossly normal   Pelvic: External genitalia:  79mm x 4 mm condylomatous lesion c/w genital wart located right perineal area close proximity to introitus              Urethra:  normal appearing urethra with no masses, tenderness or lesions              Bartholins and Skenes: normal                 Vagina: normal appearing vagina with abnormal discharge, yellowish in color, moderate amount              Cervix: neg cervical motion tenderness, no visible lesions             Bimanual Exam:   Uterus:  normal size, contour, position, consistency, mobility, non-tender              Adnexa: no mass, fullness, tenderness                 Joy, CMA Chaperone was present for exam.  A/P:  Well woman exam with routine gynecological exam  Vaginal discharge - Plan: C. trachomatis/N. gonorrhoeae RNA, WET PREP FOR TRICH, YEAST, CLUE  BV (bacterial vaginosis) - Plan: metroNIDAZOLE (FLAGYL) 500 MG tablet  Genital warts - Plan: imiquimod (ALDARA) 5 % cream  Advised that genital lesion is most consistent with genital wart, however if desires confirmation of diagnosis, biopsy is needed. Will try imiquimod first. If lesion not resolved in 3 months, RTC for removal    Pap : ASCUS with neg HR HPV in 2017 and 2020. Next co-test due 2023. Discussed new ASCCP guidelines for pap smear.  Mammogram: Due 07/2020  Labs: wet mount, GC/CT  Medications: As above  F/u 3 months if genital lesion not resolved, otherwise f/u 1 year for annual exam

## 2020-03-02 NOTE — Progress Notes (Signed)
Phone [phone]   Subjective:  Patient presents today for their annual physical. Chief complaint-noted.   See problem oriented charting- ROS- full  review of systems was completed and negative except for:  Sinus pressure with seasonal allergies, leg swelling, constipation ongoing issue- causes some anal bleeding at times also abdominal pain, back pain, neck stiffness, headaches, numbness, bruises easily particularly on legs, agitation/stres, sleep issues  The following were reviewed and entered/updated in epic: Past Medical History:  Diagnosis Date  . Allergy   . Arthritis   . Chronic headaches   . Depression   . GERD (gastroesophageal reflux disease)   . Hypertension   . Low back pain   . UTI (urinary tract infection)    Patient Active Problem List   Diagnosis Date Noted  . Current smoker 07/19/2018    Priority: High  . Constipation 07/19/2018    Priority: Medium  . History of adenomatous polyp of colon 09/23/2016    Priority: Medium  . Migraines 05/06/2016    Priority: Medium  . HTN (hypertension) 11/21/2011    Priority: Medium  . Depression 09/06/2006    Priority: Medium  . Restless legs 07/19/2018    Priority: Low  . Ingrown toenail 12/20/2016    Priority: Low  . Family history of cancer of GI tract 12/06/2012    Priority: Low  . MENORRHAGIA 09/23/2009    Priority: Low  . PLANTAR FASCIITIS 09/20/2007    Priority: Low  . Osteoarthritis 09/19/2006    Priority: Low  . Allergic rhinitis 09/06/2006    Priority: Low  . GERD 09/06/2006    Priority: Low  . Morbid obesity ([place]) 03/03/2020  . B12 deficiency 07/19/2018   Past Surgical History:  Procedure Laterality Date  . TUBAL LIGATION      Family History  Problem Relation Age of Onset  . Diabetes Father        mother  . Hyperlipidemia Father        mother  . Hypertension Father        mother  . Colon cancer Father        7s, and grandmother  . Lung cancer Father        in 39s  . Cancer - Other  Sister        duodenal  . Stomach cancer Sister   . Diabetes Mother   . Hypertension Mother   . Hyperlipidemia Mother   . Kidney disease Mother        has fistula- could need dialysis at any time  . Colon cancer Paternal Grandmother   . Colon cancer Maternal Aunt   . Esophageal cancer Neg Hx   . Liver cancer Neg Hx   . Pancreatic cancer Neg Hx   . Rectal cancer Neg Hx     Medications- reviewed and updated Current Outpatient Medications  Medication Sig Dispense Refill  . azelastine (ASTELIN) 0.1 % nasal spray Place 2 sprays into both nostrils 2 (two) times daily. 30 mL 12  . diclofenac Sodium (VOLTAREN) 1 % GEL Apply 4 g topically 4 (four) times daily. To affected joint. 100 g 11  . fluticasone (FLONASE) 50 MCG/ACT nasal spray SPRAY 2 SPRAYS INTO EACH NOSTRIL EVERY DAY 48 mL 2  . gabapentin (NEURONTIN) 300 MG capsule PLEASE SEE ATTACHED FOR DETAILED DIRECTIONS    . omeprazole (PRILOSEC) 40 MG capsule TAKE 1 CAPSULE BY MOUTH EVERY DAY 90 capsule 3  . amLODipine (NORVASC) 2.5 MG tablet Take 1 tablet (2.5 mg total) by  mouth daily. 90 tablet 3  . hydrochlorothiazide (HYDRODIURIL) 25 MG tablet Take 1 tablet (25 mg total) by mouth daily. 90 tablet 3  . meloxicam (MOBIC) 7.5 MG tablet Take 1 tablet (7.5 mg total) by mouth daily as needed. for pain 90 tablet 1   No current facility-administered medications for this visit.    Allergies-reviewed and updated Allergies  Allergen Reactions  . Amoxicillin Swelling    Lip swelling    Social History   Social History Narrative   Married May 13th 2017. 2 children- son 65 (married) and daughter 33. Granddaughter august 2017.       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 Oropharynx normal. Some nasal turbinate edema and discharge Neck: no thyromegaly CV: RRR no murmurs rubs or gallops Lungs: CTAB no crackles, wheeze, rhonchi Abdomen: soft/nontender/nondistended/normal bowel sounds. No rebound or guarding.  Ext: no edema Skin: warm, dry Neuro: grossly normal, moves all extremities, PERRLA Msk: some pain over right greater trochanteric bursa it apperas   Assessment and Plan   50 y.o. female presenting for annual physical.  Health Maintenance counseling: 1. Anticipatory guidance: Patient counseled regarding regular dental exams q6 months, eye exams,  avoiding smoking and second hand smoke, limiting alcohol to 1 beverage per day.   2. Risk factor reduction:  Advised patient of need for regular exercise and diet rich and fruits and vegetables to reduce risk of heart attack and stroke. Exercise- feels like doesn't have time- goal 150 minutes a week. Diet-has cut down on portion sizes and may miss a meal like lunch.  Patient down 6 pounds from last physical May 2020 despite quitting smoking Wt Readings from Last 3 Encounters:  03/03/20 243 lb (110.2 kg)  01/27/20 242 lb 9.6 oz (110 kg)  11/28/19 248 lb 9.6 oz (112.8 kg)  3. Immunizations/screenings/ancillary studies-discussed Shingrix, discussed hepatitis C screening-patient opts in.  Patient should have good immunity to COVID-19- history of COVID-12 March 2019 + has been triple vaccinated since that time  Immunization History  Administered Date(s) Administered  . Influenza Split 11/21/2011  . Influenza Whole 11/19/2007, 12/03/2008, 11/22/2018  . Influenza,inj,Quad PF,6+ Mos 11/21/2012, 12/24/2015  . Influenza,inj,quad, With Preservative 12/18/2017  . Influenza-Unspecified 12/17/2014, 12/04/2019  . PFIZER SARS-COV-2 Vaccination 05/30/2019, 06/26/2019, 02/19/2020  . Td 02/21/2001  . Tdap 11/21/2011   4. Cervical cancer screening- follows with Dr. Mariam Dollar gynecology later today.  ASCUS negative for high risk HPV last visit- may need repeat Pap smear at this time 5. Breast cancer screening-  breast exam with gynecology and mammogram 07/24/19- she plans to call to schedule later this year 6. Colon cancer screening -adenomatous colon polyp August 2018 with 5-year repeat planned 7. Skin cancer screening-low risk due to melanin content in skin. advised regular sunscreen use. Denies worrisome, changing, or new skin lesions.  8. Birth control/STD check- tubal ligation/monogamous 9. Osteoporosis screening at 30-RJ will certainly plan on this -Former smoker-patient quit smoking on.  Previously smoking 1-1-1/2 black and milds per day.  Below threshold for lung cancer screening- we will get urinalysis  Status of  chronic or acute concerns   #Depression S: Patient compliant with Paxil 10 mg in past- now off. Some stress with her and her husband- he is having trouble losing jobs- spending more time on video games and not helping around house as much- she also has to care for her mom.  Depression screen [facility] 2/9 03/03/2020 01/25/2019 07/19/2018  Decreased Interest 0 0 0  Down, Depressed, Hopeless 2 0 0  PHQ - 2 Score 2 0 0  Altered sleeping 3 1 2   Tired, decreased energy 2 0 0  Change in appetite 1 0 3  Feeling bad or failure about yourself  0 0 0  Trouble concentrating 1 0 0  Moving slowly or fidgety/restless 0 0 0  Suicidal thoughts 0 0 0  PHQ-9 Score 9 1 5   Difficult doing work/chores - Not difficult at all Not difficult at all  A/P: Mild poor control based on PHQ-9 prefers to stay off meds for now- we will trial counseling with [facility] or other    #Hypertension S: Compliant with amlodipine 2.5 mg and hydrochlorothiazide 25 mg  BP Readings from Last 3 Encounters:  03/03/20 122/80  01/27/20 (!) 142/85  12/06/19 131/86   A/P: Well-controlled-continue current medications- despite taking meloxicam   # Migraines with aura S:  Well controlled in the past on sparing Fioricet (not on anymore).  Primarily uses [employer] migraine tablets first. Overall has been decreasing.  Also gets aura A/P: doing well- continue to monitor   #Low back pain issues-history of bulging disc L5-S1 with lumbar radiculopathy-still on meoxicam and gabapentin 300mg  TID- needs refill on meloxicam- followed by Dr. Georgina Snell -we need to monitor kidney function every 6 months if still on meloxicam  #Osteoarthritis- lumbar spine, knees, shoulders.  Meloxicam helpful in the past as would like to avoid if possible due to hypertension and risks to kidneys - for know seems like she needs this   #Morbid obesity with BMI over 35 as well as hypertension and degenerative disc disease- Encouraged need for healthy eating, regular exercise, weight loss.  Congratulated patient on at least being a few pounds lighter than last physical  #allergies- controlled on astelin and flonase- continue current meds. Also does an allergy sinus med- asked her to bring next visit so we could look at it.   # constipation- miralax helps some- particularly if helped daily- perhaps 2-3 x week but less straining and less abdominal pain. Not doing the best for staying hydrated  #varicose veins going up back of upper leg- discussed thigh high compression. Offered vascular consult if symptoms worsen despite using this. Not a lot of pain at present- if occurs despite compression would definitely refer to vascular. Does get some right leg pain- but thinks this couldbe from the back.  #b12 deficiency in past- off b12 recently- update labs today  # GERD-  Working with Dr. Henrene Pastor- still on omeprazole 40 mg. Meloxicam not ideal for GERD but right now still having pain so will continue  Recommended follow up: Return in about 6 months (around 08/31/2020) for follow up- or sooner if needed. Future Appointments  Date Time Provider [place]  03/03/2020 12:00 PM Karma Ganja, NP [facility]  None  03/03/2020  3:00 PM Gregor Hams, MD [facility] None   Lab/Order associations: fasting   ICD-10-CM   1. Preventative health care  Z00.00 Lipid panel    CBC with Differential/Platelet    Comprehensive metabolic panel    Hepatitis C antibody  POCT Urinalysis Dipstick (Automated)  2. Primary hypertension  I10 Lipid panel    CBC with Differential/Platelet    Comprehensive metabolic panel  3. Gastroesophageal reflux disease without esophagitis  K21.9   4. B12 deficiency  E53.8 Vitamin B12  5. Former smoker  Z87.891 POCT Urinalysis Dipstick (Automated)  6. Recurrent major depressive disorder, in full remission ([place])  F33.42   7. Encounter for hepatitis C screening test for low risk patient  Z11.59 Hepatitis C antibody  8. Morbid obesity ([place])  E66.01   9. Essential hypertension  I10 amLODipine (NORVASC) 2.5 MG tablet    Meds ordered this encounter  Medications  . meloxicam (MOBIC) 7.5 MG tablet    Sig: Take 1 tablet (7.5 mg total) by mouth daily as needed. for pain    Dispense:  90 tablet    Refill:  1  . hydrochlorothiazide (HYDRODIURIL) 25 MG tablet    Sig: Take 1 tablet (25 mg total) by mouth daily.    Dispense:  90 tablet    Refill:  3  . amLODipine (NORVASC) 2.5 MG tablet    Sig: Take 1 tablet (2.5 mg total) by mouth daily.    Dispense:  90 tablet    Refill:  3    Return precautions advised.  Garret Reddish, MD

## 2020-03-03 ENCOUNTER — Encounter: Payer: Self-pay | Admitting: Nurse Practitioner

## 2020-03-03 ENCOUNTER — Ambulatory Visit (INDEPENDENT_AMBULATORY_CARE_PROVIDER_SITE_OTHER): Payer: BC Managed Care – PPO | Admitting: Nurse Practitioner

## 2020-03-03 ENCOUNTER — Encounter: Payer: Self-pay | Admitting: Family Medicine

## 2020-03-03 ENCOUNTER — Ambulatory Visit: Payer: Self-pay

## 2020-03-03 ENCOUNTER — Ambulatory Visit: Payer: BC Managed Care – PPO | Admitting: Nurse Practitioner

## 2020-03-03 ENCOUNTER — Ambulatory Visit: Payer: BC Managed Care – PPO | Admitting: Family Medicine

## 2020-03-03 ENCOUNTER — Other Ambulatory Visit: Payer: Self-pay

## 2020-03-03 ENCOUNTER — Ambulatory Visit (INDEPENDENT_AMBULATORY_CARE_PROVIDER_SITE_OTHER): Payer: BC Managed Care – PPO | Admitting: Family Medicine

## 2020-03-03 VITALS — BP 118/80 | HR 72 | Resp 16 | Ht 69.0 in | Wt 243.0 lb

## 2020-03-03 VITALS — BP 122/80 | HR 78 | Temp 97.4°F | Resp 18 | Ht 69.0 in | Wt 243.0 lb

## 2020-03-03 VITALS — BP 110/70 | HR 76 | Ht 69.0 in | Wt 246.4 lb

## 2020-03-03 DIAGNOSIS — M7061 Trochanteric bursitis, right hip: Secondary | ICD-10-CM | POA: Diagnosis not present

## 2020-03-03 DIAGNOSIS — Z0000 Encounter for general adult medical examination without abnormal findings: Secondary | ICD-10-CM | POA: Diagnosis not present

## 2020-03-03 DIAGNOSIS — N760 Acute vaginitis: Secondary | ICD-10-CM

## 2020-03-03 DIAGNOSIS — F3342 Major depressive disorder, recurrent, in full remission: Secondary | ICD-10-CM

## 2020-03-03 DIAGNOSIS — Z87891 Personal history of nicotine dependence: Secondary | ICD-10-CM

## 2020-03-03 DIAGNOSIS — I10 Essential (primary) hypertension: Secondary | ICD-10-CM

## 2020-03-03 DIAGNOSIS — E538 Deficiency of other specified B group vitamins: Secondary | ICD-10-CM

## 2020-03-03 DIAGNOSIS — N898 Other specified noninflammatory disorders of vagina: Secondary | ICD-10-CM

## 2020-03-03 DIAGNOSIS — Z23 Encounter for immunization: Secondary | ICD-10-CM

## 2020-03-03 DIAGNOSIS — A630 Anogenital (venereal) warts: Secondary | ICD-10-CM | POA: Diagnosis not present

## 2020-03-03 DIAGNOSIS — Z1159 Encounter for screening for other viral diseases: Secondary | ICD-10-CM

## 2020-03-03 DIAGNOSIS — K219 Gastro-esophageal reflux disease without esophagitis: Secondary | ICD-10-CM | POA: Diagnosis not present

## 2020-03-03 DIAGNOSIS — Z01419 Encounter for gynecological examination (general) (routine) without abnormal findings: Secondary | ICD-10-CM

## 2020-03-03 DIAGNOSIS — F17200 Nicotine dependence, unspecified, uncomplicated: Secondary | ICD-10-CM

## 2020-03-03 DIAGNOSIS — B9689 Other specified bacterial agents as the cause of diseases classified elsewhere: Secondary | ICD-10-CM

## 2020-03-03 LAB — CBC WITH DIFFERENTIAL/PLATELET
Basophils Absolute: 0.1 10*3/uL (ref 0.0–0.1)
Basophils Relative: 0.5 % (ref 0.0–3.0)
Eosinophils Absolute: 0.3 10*3/uL (ref 0.0–0.7)
Eosinophils Relative: 2.9 % (ref 0.0–5.0)
HCT: 42.7 % (ref 36.0–46.0)
Hemoglobin: 15.1 g/dL — ABNORMAL HIGH (ref 12.0–15.0)
Lymphocytes Relative: 38.2 % (ref 12.0–46.0)
Lymphs Abs: 4.5 10*3/uL — ABNORMAL HIGH (ref 0.7–4.0)
MCHC: 35.4 g/dL (ref 30.0–36.0)
MCV: 88.6 fl (ref 78.0–100.0)
Monocytes Absolute: 0.5 10*3/uL (ref 0.1–1.0)
Monocytes Relative: 3.9 % (ref 3.0–12.0)
Neutro Abs: 6.4 10*3/uL (ref 1.4–7.7)
Neutrophils Relative %: 54.5 % (ref 43.0–77.0)
Platelets: 219 10*3/uL (ref 150.0–400.0)
RBC: 4.82 Mil/uL (ref 3.87–5.11)
RDW: 14.1 % (ref 11.5–15.5)
WBC: 11.7 10*3/uL — ABNORMAL HIGH (ref 4.0–10.5)

## 2020-03-03 LAB — WET PREP FOR TRICH, YEAST, CLUE

## 2020-03-03 LAB — COMPREHENSIVE METABOLIC PANEL
ALT: 29 U/L (ref 0–35)
AST: 33 U/L (ref 0–37)
Albumin: 4.2 g/dL (ref 3.5–5.2)
Alkaline Phosphatase: 68 U/L (ref 39–117)
BUN: 10 mg/dL (ref 6–23)
CO2: 30 mEq/L (ref 19–32)
Calcium: 9.9 mg/dL (ref 8.4–10.5)
Chloride: 102 mEq/L (ref 96–112)
Creatinine, Ser: 0.86 mg/dL (ref 0.40–1.20)
GFR: 78.17 mL/min (ref >60.00)
Glucose, Bld: 98 mg/dL (ref 70–99)
Potassium: 4 mEq/L (ref 3.5–5.1)
Sodium: 138 mEq/L (ref 135–145)
Total Bilirubin: 0.9 mg/dL (ref 0.2–1.2)
Total Protein: 6.8 g/dL (ref 6.0–8.3)

## 2020-03-03 LAB — LIPID PANEL
Cholesterol: 147 mg/dL (ref 0–200)
HDL: 39.9 mg/dL (ref >39.00)
LDL Cholesterol: 83 mg/dL (ref 0–99)
NonHDL: 106.87
Total CHOL/HDL Ratio: 4
Triglycerides: 119 mg/dL (ref 0.0–149.0)
VLDL: 23.8 mg/dL (ref 0.0–40.0)

## 2020-03-03 LAB — VITAMIN B12: Vitamin B-12: 345 pg/mL (ref 211–911)

## 2020-03-03 MED ORDER — HYDROCHLOROTHIAZIDE 25 MG PO TABS: 25.0000 mg | ORAL_TABLET | Freq: Every day | ORAL | 3 refills | Status: DC

## 2020-03-03 MED ORDER — MELOXICAM 7.5 MG PO TABS: 7.5000 mg | ORAL_TABLET | Freq: Every day | ORAL | 1 refills | Status: DC | PRN

## 2020-03-03 MED ORDER — GABAPENTIN 300 MG PO CAPS: 600.0000 mg | ORAL_CAPSULE | Freq: Every day | ORAL | 1 refills | Status: DC

## 2020-03-03 MED ORDER — AMLODIPINE BESYLATE 2.5 MG PO TABS: 2.5000 mg | ORAL_TABLET | Freq: Every day | ORAL | 3 refills | Status: DC

## 2020-03-03 MED ORDER — METRONIDAZOLE 500 MG PO TABS: 500.0000 mg | ORAL_TABLET | Freq: Two times a day (BID) | ORAL | 0 refills | Status: DC

## 2020-03-03 MED ORDER — IMIQUIMOD 5 % EX CREA: TOPICAL_CREAM | CUTANEOUS | 2 refills | Status: DC

## 2020-03-03 NOTE — Patient Instructions (Signed)
Health Maintenance for Postmenopausal Women Menopause is a normal process in which your ability to get pregnant comes to an end. This process happens slowly over many months or years, usually between the ages of 48 and 55. Menopause is complete when you have missed your menstrual periods for 12 months. It is important to talk with your health care provider about some of the most common conditions that affect women after menopause (postmenopausal women). These include heart disease, cancer, and bone loss (osteoporosis). Adopting a healthy lifestyle and getting preventive care can help to promote your health and wellness. The actions you take can also lower your chances of developing some of these common conditions. What should I know about menopause? During menopause, you may get a number of symptoms, such as:  Hot flashes. These can be moderate or severe.  Night sweats.  Decrease in sex drive.  Mood swings.  Headaches.  Tiredness.  Irritability.  Memory problems.  Insomnia. Choosing to treat or not to treat these symptoms is a decision that you make with your health care provider. Do I need hormone replacement therapy?  Hormone replacement therapy is effective in treating symptoms that are caused by menopause, such as hot flashes and night sweats.  Hormone replacement carries certain risks, especially as you become older. If you are thinking about using estrogen or estrogen with progestin, discuss the benefits and risks with your health care provider. What is my risk for heart disease and stroke? The risk of heart disease, heart attack, and stroke increases as you age. One of the causes may be a change in the body's hormones during menopause. This can affect how your body uses dietary fats, triglycerides, and cholesterol. Heart attack and stroke are medical emergencies. There are many things that you can do to help prevent heart disease and stroke. Watch your blood pressure  High  blood pressure causes heart disease and increases the risk of stroke. This is more likely to develop in people who have high blood pressure readings, are of African descent, or are overweight.  Have your blood pressure checked: ? Every 3-5 years if you are 18-39 years of age. ? Every year if you are 40 years old or older. Eat a healthy diet  Eat a diet that includes plenty of vegetables, fruits, low-fat dairy products, and lean protein.  Do not eat a lot of foods that are high in solid fats, added sugars, or sodium.   Get regular exercise Get regular exercise. This is one of the most important things you can do for your health. Most adults should:  Try to exercise for at least 150 minutes each week. The exercise should increase your heart rate and make you sweat (moderate-intensity exercise).  Try to do strengthening exercises at least twice each week. Do these in addition to the moderate-intensity exercise.  Spend less time sitting. Even light physical activity can be beneficial. Other tips  Work with your health care provider to achieve or maintain a healthy weight.  Do not use any products that contain nicotine or tobacco, such as cigarettes, e-cigarettes, and chewing tobacco. If you need help quitting, ask your health care provider.  Know your numbers. Ask your health care provider to check your cholesterol and your blood sugar (glucose). Continue to have your blood tested as directed by your health care provider. Do I need screening for cancer? Depending on your health history and family history, you may need to have cancer screening at different stages of your life.   This may include screening for:  Breast cancer.  Cervical cancer.  Lung cancer.  Colorectal cancer. What is my risk for osteoporosis? After menopause, you may be at increased risk for osteoporosis. Osteoporosis is a condition in which bone destruction happens more quickly than new bone creation. To help prevent  osteoporosis or the bone fractures that can happen because of osteoporosis, you may take the following actions:  If you are 52-31 years old, get at least 1,000 mg of calcium and at least 600 mg of vitamin D per day.  If you are older than age 14 but younger than age 72, get at least 1,200 mg of calcium and at least 600 mg of vitamin D per day.  If you are older than age 66, get at least 1,200 mg of calcium and at least 800 mg of vitamin D per day. Smoking and drinking excessive alcohol increase the risk of osteoporosis. Eat foods that are rich in calcium and vitamin D, and do weight-bearing exercises several times each week as directed by your health care provider. How does menopause affect my mental health? Depression may occur at any age, but it is more common as you become older. Common symptoms of depression include:  Low or sad mood.  Changes in sleep patterns.  Changes in appetite or eating patterns.  Feeling an overall lack of motivation or enjoyment of activities that you previously enjoyed.  Frequent crying spells. Talk with your health care provider if you think that you are experiencing depression. General instructions See your health care provider for regular wellness exams and vaccines. This may include:  Scheduling regular health, dental, and eye exams.  Getting and maintaining your vaccines. These include: ? Influenza vaccine. Get this vaccine each year before the flu season begins. ? Pneumonia vaccine. ? Shingles vaccine. ? Tetanus, diphtheria, and pertussis (Tdap) booster vaccine. Your health care provider may also recommend other immunizations. Tell your health care provider if you have ever been abused or do not feel safe at home. Summary  Menopause is a normal process in which your ability to get pregnant comes to an end.  This condition causes hot flashes, night sweats, decreased interest in sex, mood swings, headaches, or lack of sleep.  Treatment for this  condition may include hormone replacement therapy.  Take actions to keep yourself healthy, including exercising regularly, eating a healthy diet, watching your weight, and checking your blood pressure and blood sugar levels.  Get screened for cancer and depression. Make sure that you are up to date with all your vaccines. This information is not intended to replace advice given to you by your health care provider. Make sure you discuss any questions you have with your health care provider. Document Revised: 01/31/2018 Document Reviewed: 01/31/2018 Elsevier Patient Education  2021 [place].   Genital Warts  Genital warts are a common STI (sexually transmitted infection). They are small warts in the area around the genitals or the opening of the butt (anus). They sometimes cause pain. Genital warts are easily passed to other people through sex. Many people do not know that they are infected. They may be infected for years without symptoms. Even without symptoms, they can pass the infection to their sex partners. What are the causes? Genital warts are caused by a virus called HPV. HPV spreads from person to person during sex. It can be spread through vaginal, anal, and oral sex. What increases the risk?  Having sex without using a condom.  Having sex  with many people.  Having sex before the age of 36.  Being a female who is not circumcised.  Having a female sex partner who is not circumcised.  Having a weak body defense system (immune system). What are the signs or symptoms?  Small warts in the area around the genitals or the opening of the butt. These warts often grow in clusters.  Itching and irritation in the genital area or the area around the opening of the butt.  Bleeding from the warts.  Pain during sex. How is this treated?  Medicines, such as creams, that are put on the skin.  Procedures, such as: ? Freezing the warts. ? Burning the warts. ? Having surgery to get  rid of the warts. Getting treatment is important because genital warts can lead to other problems. In females, the virus that causes the warts may increase the risk for cancer of the cervix. Follow these instructions at home: Medicines  Apply over-the-counter and prescription medicines only as told by your doctor.  Do not use medicines that are meant for treating hand warts.  Talk with your doctor about using creams to treat itching.   Instructions for women  Get regular tests to check for cancer of the cervix. This type of cancer grows slowly and can almost always be cured if it is found early.  If you become pregnant, tell your doctor that you have had genital warts. General instructions  Do not touch or scratch the warts.  Do not have sex until your treatment is done.  Tell your current and past sex partners about your condition. They may need treatment.  After treatment, use condoms during sex.  Keep all follow-up visits as told by your doctor. This is important. How is this prevented? Talk with your doctor about getting the HPV shot. The HPV shot:  Can help stop some HPV infections and cancers.  Is given to males and females who are 54-67 years old.  Is not recommended for pregnant women.  Will not work if you already have HPV. Contact a doctor if:  You have redness, swelling, or pain in the area of the treated skin.  You have a fever.  You feel sick.  You have lumps or have bleeding in the area around your genitals or the opening of your butt.  You have pain during sex.  You have bleeding after sex. Summary  Genital warts are a common STI. They are small warts in the area around the genitals or the opening of the butt (anus).  A virus called HPV causes genital warts.  The virus is spread by having sex without using a condom.  Getting treatment is important.  This condition is treated using medicines. Freezing, burning, or surgery may be done to get rid  of the warts. This information is not intended to replace advice given to you by your health care provider. Make sure you discuss any questions you have with your health care provider. Document Revised: 12/20/2018 Document Reviewed: 12/20/2018 Elsevier Patient Education  2021 [place].   Bacterial Vaginosis  Bacterial vaginosis is an infection of the vagina. It happens when too many normal germs (healthy bacteria) grow in the vagina. This infection can make it easier to get other infections from sex (STIs). It is very important for pregnant women to get treated. This infection can cause babies to be born early or at a low birth weight. What are the causes? This infection is caused by an increase in  certain germs that grow in the vagina. You cannot get this infection from toilet seats, bedsheets, swimming pools, or things that touch your vagina. What increases the risk?  Having sex with a new person or more than one person.  Having sex without protection.  Douching.  Having an intrauterine device (IUD).  Smoking.  Using drugs or drinking alcohol. These can lead you to do things that are risky.  Taking certain antibiotic medicines.  Being pregnant. What are the signs or symptoms? Some women have no symptoms. Symptoms may include:  A discharge from your vagina. It may be gray or white. It can be watery or foamy.  A fishy smell. This can happen after sex or during your menstrual period.  Itching in and around your vagina.  A feeling of burning or pain when you pee (urinate). How is this treated? This infection is treated with antibiotic medicines. These may be given to you as:  A pill.  A cream for your vagina.  A medicine that you put into your vagina (suppository). If the infection comes back after treatment, you may need more antibiotics. Follow these instructions at home: Medicines  Take over-the-counter and prescription medicines as told by your  doctor.  Take or use your antibiotic medicine as told by your doctor. Do not stop taking or using it, even if you start to feel better. General instructions  If the person you have sex with is a woman, tell her that you have this infection. She will need to follow up with her doctor. If you have a female partner, he does not need to be treated.  Do not have sex until you finish treatment.  Drink enough fluid to keep your pee pale yellow.  Keep your vagina and butt clean. ? Wash the area with warm water each day. ? Wipe from front to back after you use the toilet.  If you are breastfeeding a baby, ask your doctor if you should keep doing so during treatment.  Keep all follow-up visits. How is this prevented? Self-care  Do not douche.  Use only warm water to wash around your vagina.  Wear underwear that is cotton or lined with cotton.  Do not wear tight pants and pantyhose, especially in the summer. Safe sex  Use protection when you have sex. This includes: ? Use condoms. ? Use dental dams. This is a thin layer that protects the mouth during oral sex.  Limit how many people you have sex with. To prevent this infection, it is best to have sex with just one person.  Get tested for STIs. The person you have sex with should also get tested. Drugs and alcohol  Do not smoke or use any products that contain nicotine or tobacco. If you need help quitting, ask your doctor.  Do not use drugs.  Do not drink alcohol if: ? Your doctor tells you not to drink. ? You are pregnant, may be pregnant, or are planning to become pregnant.  If you drink alcohol: ? Limit how much you have to 0-1 drink a day. ? Know how much alcohol is in your drink. In the [place] one drink equals one 12 oz bottle of beer (355 mL), one 5 oz glass of wine (148 mL), or one 1 oz glass of hard liquor (44 mL). Where to find more information  Centers for Disease Control and Prevention: http://www.wolf.info/  American Sexual  Health Association: www.ashastd.org  Office on Women's Health: [place] Contact a doctor if:  Your symptoms do not get better, even after you are treated.  You have more discharge or pain when you pee.  You have a fever or chills.  You have pain in your belly (abdomen) or in the area between your hips.  You have pain with sex.  You bleed from your vagina between menstrual periods. Summary  This infection can happen when too many germs (bacteria) grow in the vagina.  This infection can make it easier to get infections from sex (STIs). Treating this can lower that chance.  Get treated if you are pregnant. This infection can cause babies to be born early.  Do not stop taking or using your antibiotic medicine, even if you start to feel better. This information is not intended to replace advice given to you by your health care provider. Make sure you discuss any questions you have with your health care provider. Document Revised: 08/08/2019 Document Reviewed: 08/08/2019 Elsevier Patient Education  [place].

## 2020-03-03 NOTE — Addendum Note (Signed)
Addended by: Thomes Cake on: 03/03/2020 11:38 AM   Modules accepted: Orders

## 2020-03-03 NOTE — Patient Instructions (Addendum)
Thank you for coming in today.  Call or go to the ER if you develop a large red swollen joint with extreme pain or oozing puss.   Do the exercises we discussed: View at my-exercise-code.com using code: UUVTDRZ  If this does not help enough I can do that SI joint injection we talked about.   Keep me updated.

## 2020-03-04 ENCOUNTER — Telehealth: Payer: Self-pay

## 2020-03-04 ENCOUNTER — Telehealth: Payer: Self-pay | Admitting: *Deleted

## 2020-03-04 LAB — C. TRACHOMATIS/N. GONORRHOEAE RNA
C. trachomatis RNA, TMA: NOT DETECTED
N. gonorrhoeae RNA, TMA: NOT DETECTED

## 2020-03-04 LAB — HEPATITIS C ANTIBODY
Hepatitis C Ab: NONREACTIVE
SIGNAL TO CUT-OFF: 0.02 (ref <1.00)

## 2020-03-04 NOTE — Telephone Encounter (Signed)
Spoke with patient and everything is complete. Thanks!

## 2020-03-04 NOTE — Telephone Encounter (Signed)
Patient states that she gave a form to Dr.Hunter yesterday and is calling in asking for it to be faxed to [phone].

## 2020-03-04 NOTE — Telephone Encounter (Signed)
PA done for Imiquimod 5% cream via cover my meds, medication approved Effective from 03/04/2020 through 06/23/2020.. pharmacy informed.

## 2020-04-20 ENCOUNTER — Telehealth (INDEPENDENT_AMBULATORY_CARE_PROVIDER_SITE_OTHER): Payer: BC Managed Care – PPO | Admitting: Physician Assistant

## 2020-04-20 ENCOUNTER — Encounter: Payer: Self-pay | Admitting: Physician Assistant

## 2020-04-20 VITALS — Ht 69.0 in | Wt 246.0 lb

## 2020-04-20 DIAGNOSIS — J029 Acute pharyngitis, unspecified: Secondary | ICD-10-CM

## 2020-04-20 DIAGNOSIS — H9203 Otalgia, bilateral: Secondary | ICD-10-CM

## 2020-04-20 MED ORDER — AZITHROMYCIN 250 MG PO TABS: ORAL_TABLET | ORAL | 0 refills | Status: DC

## 2020-04-20 NOTE — Progress Notes (Signed)
Virtual Visit via Video   I connected with Tonya Suarez on 04/20/20 at  4:00 PM EST by a video enabled telemedicine application and verified that I am speaking with the correct person using two identifiers. Location patient: Home Location provider: [facility], Office Persons participating in the virtual visit: Char Roxanna Mcever, Inda Coke PA-C, Anselmo Pickler, LPN   I discussed the limitations of evaluation and management by telemedicine and the availability of in person appointments. The patient expressed understanding and agreed to proceed.  Subjective:   HPI:   URI symptoms Symptom onset: Wed 02/23  Travel/contacts: None  Vaccination status: complete  Testing results: none  Patient endorses the following symptoms: Bilateral ear pain, dry non-productive cough, headache, scratchy throat.  Patient denies the following symptoms: No fever or chills, SOB, chest pain.  Treatments tried: Mucinex, sweet oil in ears  Patient risk factors: Current [id number] risk of complications score: 2 Smoking status: Tonya Suarez  reports that she has quit smoking. Her smoking use included cigars. She has never used smokeless tobacco. If female, currently pregnant? []   Yes [x]   No  ROS: See pertinent positives and negatives per HPI.  Patient Active Problem List   Diagnosis Date Noted  . Morbid obesity ([place]) 03/03/2020  . Former smoker 07/19/2018  . Constipation 07/19/2018  . B12 deficiency 07/19/2018  . Restless legs 07/19/2018  . Ingrown toenail 12/20/2016  . History of adenomatous polyp of colon 09/23/2016  . Migraines 05/06/2016  . Family history of cancer of GI tract 12/06/2012  . HTN (hypertension) 11/21/2011  . MENORRHAGIA 09/23/2009  . PLANTAR FASCIITIS 09/20/2007  . Osteoarthritis 09/19/2006  . Depression 09/06/2006  . Allergic rhinitis 09/06/2006  . GERD 09/06/2006    Social History   Tobacco Use  . Smoking status: Former Smoker     Types: Cigars  . Smokeless tobacco: Never Used  . Tobacco comment: black and mild one per day  Substance Use Topics  . Alcohol use: Yes    Alcohol/week: 0.0 - 2.0 standard drinks    Comment: occ    Current Outpatient Medications:  .  amLODipine (NORVASC) 2.5 MG tablet, Take 1 tablet (2.5 mg total) by mouth daily., Disp: 90 tablet, Rfl: 3 .  azelastine (ASTELIN) 0.1 % nasal spray, Place 2 sprays into both nostrils 2 (two) times daily., Disp: 30 mL, Rfl: 12 .  azithromycin (ZITHROMAX) 250 MG tablet, Take two tablets on day 1, then one daily x 4 days, Disp: 6 tablet, Rfl: 0 .  diclofenac Sodium (VOLTAREN) 1 % GEL, Apply 4 g topically 4 (four) times daily. To affected joint., Disp: 100 g, Rfl: 11 .  fluticasone (FLONASE) 50 MCG/ACT nasal spray, SPRAY 2 SPRAYS INTO EACH NOSTRIL EVERY DAY, Disp: 48 mL, Rfl: 2 .  gabapentin (NEURONTIN) 300 MG capsule, Take 2 capsules (600 mg total) by mouth at bedtime., Disp: 180 capsule, Rfl: 1 .  hydrochlorothiazide (HYDRODIURIL) 25 MG tablet, Take 1 tablet (25 mg total) by mouth daily., Disp: 90 tablet, Rfl: 3 .  meloxicam (MOBIC) 7.5 MG tablet, Take 1 tablet (7.5 mg total) by mouth daily as needed. for pain, Disp: 90 tablet, Rfl: 1 .  omeprazole (PRILOSEC) 40 MG capsule, TAKE 1 CAPSULE BY MOUTH EVERY DAY, Disp: 90 capsule, Rfl: 3  Allergies  Allergen Reactions  . Amoxicillin Swelling    Lip swelling    Objective:   VITALS: Per patient if applicable, see vitals. GENERAL: Alert, appears well and in no acute distress. HEENT:  Atraumatic, conjunctiva clear, no obvious abnormalities on inspection of external nose and ears. NECK: Normal movements of the head and neck. CARDIOPULMONARY: No increased WOB. Speaking in clear sentences. I:E ratio WNL.  MS: Moves all visible extremities without noticeable abnormality. PSYCH: Pleasant and cooperative, well-groomed. Speech normal rate and rhythm. Affect is appropriate. Insight and judgement are appropriate. Attention  is focused, linear, and appropriate.  NEURO: CN grossly intact. Oriented as arrived to appointment on time with no prompting. Moves both UE equally.  SKIN: No obvious lesions, wounds, erythema, or cyanosis noted on face or hands.  Assessment and Plan:   Keyandra was seen today for covid symptoms.  Diagnoses and all orders for this visit:  Otalgia of both ears  Pharyngitis, unspecified etiology  Other orders -     azithromycin (ZITHROMAX) 250 MG tablet; Take two tablets on day 1, then one daily x 4 days   No red flags on discussion, patient is not in any obvious distress during our visit. Discussed progression of most viral illnesses, and recommended supportive care at this point in time.  Declines COVID testing at this time. I did however provide pocket rx for oral azithromycin should symptoms not improve as anticipated. Discussed over the counter supportive care options, including Tylenol 500 mg q 8 hours, with recommendations to push fluids and rest. Reviewed return precautions including new/worsening fever, SOB, new/worsening cough, sudden onset changes of symptoms. Recommended need to self-quarantine and practice social distancing until symptoms resolve. I recommend that patient follow-up if symptoms worsen or persist despite treatment x 7-10 days, sooner if needed.  I discussed the assessment and treatment plan with the patient. The patient was provided an opportunity to ask questions and all were answered. The patient agreed with the plan and demonstrated an understanding of the instructions.   The patient was advised to call back or seek an in-person evaluation if the symptoms worsen or if the condition fails to improve as anticipated.   CMA or LPN served as scribe during this visit. History, Physical, and Plan performed by medical provider. The above documentation has been reviewed and is accurate and complete.   [place], [place] 04/20/2020

## 2020-04-27 ENCOUNTER — Telehealth: Payer: Self-pay

## 2020-04-27 NOTE — Telephone Encounter (Signed)
If symptoms > 10 days needs in person evaluation- looks like she has appointment tomorrow with samantha?

## 2020-04-27 NOTE — Telephone Encounter (Signed)
Per Sams note pt was to call back if no improvement OR seek in person care, can this be handled via phone call or do you want pt to schedule an OV with you?

## 2020-04-27 NOTE — Telephone Encounter (Signed)
Pt was seen last week for stopped up ears. She completed the antibiotics given by [facility] and there has been no improvement. Pt wants to know how to proceed. Ok to leave voicemail on cell

## 2020-04-28 ENCOUNTER — Other Ambulatory Visit: Payer: Self-pay

## 2020-04-28 ENCOUNTER — Encounter: Payer: Self-pay | Admitting: Physician Assistant

## 2020-04-28 ENCOUNTER — Ambulatory Visit: Payer: BC Managed Care – PPO | Admitting: Physician Assistant

## 2020-04-28 VITALS — BP 120/72 | HR 83 | Temp 97.2°F | Ht 69.0 in | Wt 248.0 lb

## 2020-04-28 DIAGNOSIS — H6690 Otitis media, unspecified, unspecified ear: Secondary | ICD-10-CM

## 2020-04-28 MED ORDER — CEFDINIR 300 MG PO CAPS: 300.0000 mg | ORAL_CAPSULE | Freq: Two times a day (BID) | ORAL | 0 refills | Status: AC

## 2020-04-28 NOTE — Progress Notes (Signed)
Tonya Suarez is a 50 y.o. female is here for follow up.  I acted as a Education administrator for [employer], PA-C Anselmo Pickler, LPN   History of Present Illness:   Chief Complaint  Patient presents with  . Otalgia    HPI  Otalgia Pt still c/o bilateral ear pain. left >right. Had virtual visit on 2/28 for URI and was given Z-pak. Completed medication.  Patient reports ongoing bilateral ear pain.  She states that there are times where she has pressure and fullness, as well as pain with moving her ear.  She wears earplugs throughout the day but not consistently.  Denies fever, chills, significant cough.  She reports that her grandson had an ear infection about 2 to 3 weeks ago.     There are no preventive care reminders to display for this patient.  Past Medical History:  Diagnosis Date  . Allergy   . Arthritis   . Chronic headaches   . Depression   . GERD (gastroesophageal reflux disease)   . Hypertension   . Low back pain   . UTI (urinary tract infection)      Social History   Tobacco Use  . Smoking status: Former Smoker    Types: Cigars  . Smokeless tobacco: Never Used  . Tobacco comment: black and mild one per day  Vaping Use  . Vaping Use: Never used  Substance Use Topics  . Alcohol use: Yes    Alcohol/week: 0.0 - 2.0 standard drinks    Comment: occ  . Drug use: No    Past Surgical History:  Procedure Laterality Date  . TUBAL LIGATION      Family History  Problem Relation Age of Onset  . Diabetes Father        mother  . Hyperlipidemia Father        mother  . Hypertension Father        mother  . Colon cancer Father        37s, and grandmother  . Lung cancer Father        in 47s  . Cancer - Other Sister        duodenal  . Stomach cancer Sister   . Diabetes Mother   . Hypertension Mother   . Hyperlipidemia Mother   . Kidney disease Mother        has fistula- could need dialysis at any time  . Colon cancer Paternal Grandmother   . Colon  cancer Maternal Aunt   . Esophageal cancer Neg Hx   . Liver cancer Neg Hx   . Pancreatic cancer Neg Hx   . Rectal cancer Neg Hx     PMHx, SurgHx, SocialHx, FamHx, Medications, and Allergies were reviewed in the Visit Navigator and updated as appropriate.   Patient Active Problem List   Diagnosis Date Noted  . Morbid obesity ([place]) 03/03/2020  . Former smoker 07/19/2018  . Constipation 07/19/2018  . B12 deficiency 07/19/2018  . Restless legs 07/19/2018  . Ingrown toenail 12/20/2016  . History of adenomatous polyp of colon 09/23/2016  . Migraines 05/06/2016  . Family history of cancer of GI tract 12/06/2012  . HTN (hypertension) 11/21/2011  . MENORRHAGIA 09/23/2009  . PLANTAR FASCIITIS 09/20/2007  . Osteoarthritis 09/19/2006  . Depression 09/06/2006  . Allergic rhinitis 09/06/2006  . GERD 09/06/2006    Social History   Tobacco Use  . Smoking status: Former Smoker    Types: Cigars  . Smokeless tobacco: Never Used  .  Tobacco comment: black and mild one per day  Vaping Use  . Vaping Use: Never used  Substance Use Topics  . Alcohol use: Yes    Alcohol/week: 0.0 - 2.0 standard drinks    Comment: occ  . Drug use: No    Current Medications and Allergies:    Current Outpatient Medications:  .  amLODipine (NORVASC) 2.5 MG tablet, Take 1 tablet (2.5 mg total) by mouth daily., Disp: 90 tablet, Rfl: 3 .  azelastine (ASTELIN) 0.1 % nasal spray, Place 2 sprays into both nostrils 2 (two) times daily., Disp: 30 mL, Rfl: 12 .  cefdinir (OMNICEF) 300 MG capsule, Take 1 capsule (300 mg total) by mouth 2 (two) times daily for 7 days., Disp: 14 capsule, Rfl: 0 .  diclofenac Sodium (VOLTAREN) 1 % GEL, Apply 4 g topically 4 (four) times daily. To affected joint., Disp: 100 g, Rfl: 11 .  gabapentin (NEURONTIN) 300 MG capsule, Take 2 capsules (600 mg total) by mouth at bedtime., Disp: 180 capsule, Rfl: 1 .  hydrochlorothiazide (HYDRODIURIL) 25 MG tablet, Take 1 tablet (25 mg total) by mouth  daily., Disp: 90 tablet, Rfl: 3 .  meloxicam (MOBIC) 7.5 MG tablet, Take 1 tablet (7.5 mg total) by mouth daily as needed. for pain, Disp: 90 tablet, Rfl: 1 .  omeprazole (PRILOSEC) 40 MG capsule, TAKE 1 CAPSULE BY MOUTH EVERY DAY, Disp: 90 capsule, Rfl: 3   Allergies  Allergen Reactions  . Amoxicillin Swelling    Lip swelling    Review of Systems   ROS  Negative unless otherwise specified per HPI.  Vitals:   Vitals:   04/28/20 1501  BP: 120/72  Pulse: 83  Temp: (!) 97.2 F (36.2 C)  TempSrc: Temporal  SpO2: 97%  Weight: 248 lb (112.5 kg)  Height: 5\' 9"  (1.753 m)     Body mass index is 36.62 kg/m.   Physical Exam:    Physical Exam Vitals and nursing note reviewed.  Constitutional:      General: She is not in acute distress.    Appearance: She is well-developed. She is not ill-appearing, toxic-appearing or sickly-appearing.  HENT:     Head: Normocephalic and atraumatic.     Right Ear: Ear canal and external ear normal. A middle ear effusion is present. Tympanic membrane is erythematous and bulging. Tympanic membrane is not retracted.     Left Ear: Ear canal and external ear normal. A middle ear effusion is present. Tympanic membrane is erythematous and bulging. Tympanic membrane is not retracted.     Nose: Nose normal.     Right Sinus: No maxillary sinus tenderness or frontal sinus tenderness.     Left Sinus: No maxillary sinus tenderness or frontal sinus tenderness.     Mouth/Throat:     Pharynx: Uvula midline. No posterior oropharyngeal edema or posterior oropharyngeal erythema.  Eyes:     General: Lids are normal.     Conjunctiva/sclera: Conjunctivae normal.  Neck:     Trachea: Trachea normal.  Cardiovascular:     Rate and Rhythm: Normal rate and regular rhythm.     Heart sounds: Normal heart sounds, S1 normal and S2 normal.  Pulmonary:     Effort: Pulmonary effort is normal.     Breath sounds: Normal breath sounds. No decreased breath sounds, wheezing,  rhonchi or rales.  Lymphadenopathy:     Cervical: No cervical adenopathy.  Skin:    General: Skin is warm, dry and intact.  Neurological:     Mental  Status: She is alert.  Psychiatric:        Mood and Affect: Mood and affect normal.        Speech: Speech normal.        Behavior: Behavior normal. Behavior is cooperative.      Assessment and Plan:    Gwynn was seen today for otalgia.  Diagnoses and all orders for this visit:  Acute otitis media, unspecified otitis media type  Other orders -     cefdinir (OMNICEF) 300 MG capsule; Take 1 capsule (300 mg total) by mouth 2 (two) times daily for 7 days.   Bilateral acute otitis media.  We reviewed her chart and she does have an amoxicillin allergy but has tolerated cephalosporins in the past.  We are going to trial Omnicef 300 mg twice daily for 7 days.  Worsening precautions advised.  CMA or LPN served as scribe during this visit. History, Physical, and Plan performed by medical provider. The above documentation has been reviewed and is accurate and complete.  Inda Coke, PA-C [place] [place] 04/28/2020  Follow-up: No follow-ups on file.

## 2020-04-28 NOTE — Patient Instructions (Signed)
It was great to see you!  Start omnicef antibiotic If any concerns for lip swelling stop right away  Follow-up if any changes in symptoms  Take care,  Inda Coke PA-C

## 2020-05-13 ENCOUNTER — Telehealth: Payer: Self-pay

## 2020-05-13 DIAGNOSIS — H9203 Otalgia, bilateral: Secondary | ICD-10-CM

## 2020-05-13 NOTE — Telephone Encounter (Signed)
Pt is requesting a referral to an ENT. Pt states she has met with Tonya Suarez about this issue previously, because Dr. Yong Channel was not available. Pt has no preference of which ENT.

## 2020-05-13 NOTE — Telephone Encounter (Signed)
Referral has been placed. 

## 2020-06-01 DIAGNOSIS — H903 Sensorineural hearing loss, bilateral: Secondary | ICD-10-CM | POA: Diagnosis not present

## 2020-06-01 DIAGNOSIS — H9313 Tinnitus, bilateral: Secondary | ICD-10-CM | POA: Diagnosis not present

## 2020-06-01 DIAGNOSIS — H6983 Other specified disorders of Eustachian tube, bilateral: Secondary | ICD-10-CM | POA: Diagnosis not present

## 2020-06-22 ENCOUNTER — Telehealth: Payer: Self-pay | Admitting: Family Medicine

## 2020-06-22 NOTE — Telephone Encounter (Signed)
Patient called stating that she is still having pain from her hips down to her calves. She said that when she was here to see Dr Georgina Snell he mentioned that if she did not seem to feel better, then he could order and MRI. She asked if this could be ordered for her?  Please advise.

## 2020-06-23 NOTE — Telephone Encounter (Signed)
Spoke to patient. Appointment scheduled.

## 2020-06-23 NOTE — Telephone Encounter (Signed)
It was not clear to me what the source of pain was when I saw you in January.  I think to make sure that I order the right MRI I probably should see you in clinic.  Please schedule follow-up appoint with me so that I can do another physical exam and make sure that I order an MRI that will best evaluate the pain that you are having.  I could think of perhaps 3 different MRIs that may you may need.

## 2020-06-25 ENCOUNTER — Other Ambulatory Visit: Payer: Self-pay | Admitting: Family Medicine

## 2020-06-25 ENCOUNTER — Ambulatory Visit (INDEPENDENT_AMBULATORY_CARE_PROVIDER_SITE_OTHER): Payer: BC Managed Care – PPO

## 2020-06-25 ENCOUNTER — Other Ambulatory Visit: Payer: Self-pay

## 2020-06-25 ENCOUNTER — Ambulatory Visit: Payer: BC Managed Care – PPO | Admitting: Family Medicine

## 2020-06-25 VITALS — BP 104/79 | HR 71 | Ht 69.0 in | Wt 250.6 lb

## 2020-06-25 DIAGNOSIS — M7061 Trochanteric bursitis, right hip: Secondary | ICD-10-CM

## 2020-06-25 DIAGNOSIS — M25551 Pain in right hip: Secondary | ICD-10-CM | POA: Diagnosis not present

## 2020-06-25 NOTE — Telephone Encounter (Signed)
Please advise 

## 2020-06-25 NOTE — Patient Instructions (Addendum)
Thank you for coming in today.  Please get an Xray today before you leave  Please perform the exercise program that we have prepared for you and gone over in detail on a daily basis.  In addition to the handout you were provided you can access your program through: [email or web address]   Your unique program code is:  [email or web address]   You should hear from MRI scheduling within 1 week. If you do not hear please let me know.   Recheck after the MRI.

## 2020-06-25 NOTE — Progress Notes (Signed)
I, Tonya Suarez, LAT, ATC acting as a scribe for Tonya Leader, MD.  Tonya Suarez is a 50 y.o. female who presents to [place] at [facility] today for continued R hip pain. Pt was last seen by Dr. Georgina Suarez on 03/03/20 and was given a R GT steroid injection and provided a HEP. Pt had 2 lumbar epidural steroid injections on 03/08/19 and 12/06/19. Pt called the clinic on 06/22/20 reporting continued pain from "hips to calves" and requested an MRI. Pt was advised to schedule a f/u appointment to first re-evaluate her pain. Today, pt reports bilat hip pain, R>L. Pt reports R hip along the lateral aspect w/ radiating pain down to R lower leg and posterior aspect of R knee. Pt locates L hip pain to the lateral aspect and pain radiates into L lateral thigh.  She has had a trial of physical therapy and hip injection right lateral hip for her lateral hip pain thought to be due to trochanteric bursitis in the past.  Radiates: yes LE numbness/tingling: yes LE weakness: yes- especially R Aggravates: sleeping at night, laying to R side, standing, walking, sitting for for extended periods Treatments tried: stretches, Voltaren gel, heat, ice  Dx imaging: 02/18/19 L-spine MRI             01/25/19 L-spine XR  Pertinent review of systems: No fevers or chills  Relevant historical information: Lumbar radiculopathy improved with left epidural steroid injection in the past.   Exam:  BP 104/79 (BP Location: Right Arm, Patient Position: Sitting, Cuff Size: Normal)   Pulse 71   Ht 5\' 9"  (1.753 m)   Wt 250 lb 9.6 oz (113.7 kg)   LMP 06/05/2018   SpO2 99%   BMI 37.01 kg/m  General: Well Developed, well nourished, and in no acute distress.   MSK: L-spine normal-appearing nontender midline. Decreased lumbar motion. Negative slump test bilaterally. Lower extremity strength is intact except noted below.   Right hip normal-appearing Tender palpation greater trochanter. Normal hip  motion. Hip abduction strength external rotation strength diminished 4/5.   Left hip normal-appearing Mildly tender palpation greater trochanter. Normal hip motion. Slightly reduced hip abduction strength and external rotation strength 4+/5.    Lab and Radiology Results  X-ray images right hip obtained today personally and independently interpreted No acute fractures.  Minimal degenerative changes. Await formal radiology review    EXAM: MRI LUMBAR SPINE WITHOUT CONTRAST  TECHNIQUE: Multiplanar, multisequence MR imaging of the lumbar spine was performed. No intravenous contrast was administered.  COMPARISON:  Prior radiograph from 01/25/2019 as well as previous MRI from 09/26/2004.  FINDINGS: Segmentation: Standard. Lowest well-formed disc space labeled the L5-S1 level.  Alignment: Physiologic with preservation of the normal lumbar lordosis. No listhesis.  Vertebrae: Vertebral body height maintained without evidence for acute or chronic fracture. Bone marrow signal intensity within normal limits. Few scattered benign hemangiomata noted, most prominent of which measures 17 mm within the central aspect of the L2 vertebral body. No other discrete or worrisome osseous lesions. Reactive endplate changes present about the L5-S1 interspace. No other abnormal marrow edema.  Conus medullaris and cauda equina: Conus extends to the T12-L1 level. Conus and cauda equina appear normal.  Paraspinal and other soft tissues: Paraspinous soft tissues within normal limits. Visualized visceral structures are normal.  Disc levels:  T11-12: Seen only on sagittal projection. Diffuse disc bulge with disc desiccation and intervertebral disc space narrowing. Right greater than left facet hypertrophy. No significant spinal stenosis. Mild  right foraminal narrowing.  T12-L1: Unremarkable.  L1-2:  Unremarkable.  L2-3: Mild diffuse disc bulge with disc desiccation.  Superimposed shallow left foraminal/extraforaminal disc protrusion contacts the exiting left L2 nerve root as it courses of the left neural foramen (series 10, image 17). No significant spinal stenosis. Foramina remain patent.  L3-4: Negative interspace. Moderate facet and ligament flavum hypertrophy. No significant canal or foraminal stenosis.  L4-5: Mild disc bulge with disc desiccation. Superimposed tiny central disc protrusion minimally indents the ventral thecal sac. Moderate facet and ligament flavum hypertrophy. Resultant mild left lateral recess stenosis. Central canal remains patent. Mild bilateral L4 foraminal narrowing, slightly worse on the right.  L5-S1: Chronic intervertebral disc space narrowing with diffuse disc bulge and disc desiccation. Reactive endplate changes with marginal endplate osteophytic spurring. Superimposed left subarticular disc extrusion with superior migration. Extruded disc material extends into the left lateral recess, impinging upon the descending left S1 nerve root (series 13, image 35). Superimposed mild to moderate right greater than left facet hypertrophy. Mild canal with moderate left lateral recess stenosis. Foramina remain patent.  IMPRESSION: 1. Left subarticular disc extrusion at L5-S1, impinging upon the descending left S1 nerve root in the left lateral recess. 2. Shallow left foraminal/extraforaminal disc protrusion at L2-3, contacting and potentially irritating the exiting left L2 nerve root. 3. Mild disc bulging with facet hypertrophy at L4-5 with resultant mild left lateral recess stenosis.   Electronically Signed   By: Jeannine Boga M.D.   On: 02/19/2019 03:57  I, Tonya Suarez, personally (independently) visualized and performed the interpretation of the images attached in this note.      Assessment and Plan: 50 y.o. female with bilateral lateral hip pain right worse than left.  Pain thought to be due to hip  abductor tendinopathy and trochanteric bursitis.  She does have a little bit of lumbar radiculopathy as well however that is not a dominant issue.  She has failed conservative management including trial of physical therapy and trochanteric injection.  At this point plan for MRI to further characterize cause of pain and to further potentially target injection or directed physical therapy services.  Re taught home exercise program today with ATC in clinic.  Recheck after MRI.   PDMP not reviewed this encounter. Orders Placed This Encounter  Procedures  . DG HIP UNILAT WITH PELVIS 2-3 VIEWS RIGHT    Standing Status:   Future    Number of Occurrences:   1    Standing Expiration Date:   06/25/2021    Order Specific Question:   Reason for Exam (SYMPTOM  OR DIAGNOSIS REQUIRED)    Answer:   eval right hip pain    Order Specific Question:   Is patient pregnant?    Answer:   No    Order Specific Question:   Preferred imaging location?    Answer:   [facility]  . MR HIP RIGHT WO CONTRAST    Standing Status:   Future    Standing Expiration Date:   06/25/2021    Order Specific Question:   What is the patient's sedation requirement?    Answer:   No Sedation    Order Specific Question:   Does the patient have a pacemaker or implanted devices?    Answer:   No    Order Specific Question:   Preferred imaging location?    Answer:   Product/process development scientist (table limit-350lbs)   No orders of the defined types were placed in this encounter.  Discussed warning signs or symptoms. Please see discharge instructions. Patient expresses understanding.   The above documentation has been reviewed and is accurate and complete Tonya Suarez, M.D.

## 2020-06-29 ENCOUNTER — Ambulatory Visit (INDEPENDENT_AMBULATORY_CARE_PROVIDER_SITE_OTHER): Payer: BC Managed Care – PPO | Admitting: Psychology

## 2020-06-29 DIAGNOSIS — F4321 Adjustment disorder with depressed mood: Secondary | ICD-10-CM | POA: Diagnosis not present

## 2020-06-29 NOTE — Progress Notes (Signed)
Right hip x-ray looks normal to radiology

## 2020-07-05 ENCOUNTER — Other Ambulatory Visit: Payer: BC Managed Care – PPO

## 2020-07-08 ENCOUNTER — Ambulatory Visit: Payer: BC Managed Care – PPO | Admitting: Psychology

## 2020-07-11 ENCOUNTER — Other Ambulatory Visit: Payer: Self-pay

## 2020-07-11 ENCOUNTER — Ambulatory Visit (INDEPENDENT_AMBULATORY_CARE_PROVIDER_SITE_OTHER): Payer: BC Managed Care – PPO

## 2020-07-11 DIAGNOSIS — M533 Sacrococcygeal disorders, not elsewhere classified: Secondary | ICD-10-CM | POA: Diagnosis not present

## 2020-07-11 DIAGNOSIS — M7061 Trochanteric bursitis, right hip: Secondary | ICD-10-CM | POA: Diagnosis not present

## 2020-07-11 DIAGNOSIS — M1611 Unilateral primary osteoarthritis, right hip: Secondary | ICD-10-CM | POA: Diagnosis not present

## 2020-07-13 ENCOUNTER — Ambulatory Visit: Payer: BC Managed Care – PPO | Admitting: Psychology

## 2020-07-15 ENCOUNTER — Telehealth: Payer: Self-pay | Admitting: Family Medicine

## 2020-07-15 DIAGNOSIS — R2241 Localized swelling, mass and lump, right lower limb: Secondary | ICD-10-CM

## 2020-07-15 DIAGNOSIS — R2242 Localized swelling, mass and lump, left lower limb: Secondary | ICD-10-CM

## 2020-07-15 NOTE — Progress Notes (Signed)
No fractures are visible on the hip MRI.    You do have tendinitis of both hips at the gluteus medius and minimus tendons.  The left looks worse than the right.  On the left side you have a partially visualized 4 x 1 cm elongated cystic structure extending out of the images captured on this MRI.  Radiology would like me to get a dedicated MRI of the femur to better look at this area and see the entire structure.  I have ordered it.  You should probably get this MRI this weekend or maybe next week and.  At that time would like you to return to clinic to go over the results in full detail.  I think the main source of your pain on the right side is the tendinitis seen on MRI today.

## 2020-07-15 NOTE — Telephone Encounter (Signed)
Correction.  MRI of the femur should be of the left not the right.  Please cancel MRI right femur.

## 2020-07-15 NOTE — Telephone Encounter (Signed)
MRI femur ordered per radiology recommendations to follow-up with the cystic structure partially seen on MRI hip.  See result note for further detail.

## 2020-07-22 ENCOUNTER — Telehealth: Payer: Self-pay | Admitting: Family Medicine

## 2020-07-22 NOTE — Telephone Encounter (Signed)
Informed pt that insurance denied MRI Femur and we will be doing peer to peer.  Pt states she is having pain, unable to lay on either side. Wonders what she should be doing in the interim,  exercise/pain meds?

## 2020-07-23 NOTE — Telephone Encounter (Signed)
Continue exercises you were taught in PT. Use medicines as needed especially at bedtime.

## 2020-07-23 NOTE — Telephone Encounter (Signed)
Spoke w/ pt and she reports that she's not getting any relief from the gabapentin and has been taking 2 at bedtime. She was wondering if there was a different rx that could be prescribed. She is already taking Meloxicam.

## 2020-07-24 MED ORDER — PREDNISONE 50 MG PO TABS: 50.0000 mg | ORAL_TABLET | Freq: Every day | ORAL | 0 refills | Status: DC

## 2020-07-24 NOTE — Telephone Encounter (Signed)
I called Rashmi back. Will try prednisone.

## 2020-07-29 ENCOUNTER — Telehealth: Payer: Self-pay | Admitting: Internal Medicine

## 2020-07-29 DIAGNOSIS — Z1231 Encounter for screening mammogram for malignant neoplasm of breast: Secondary | ICD-10-CM | POA: Diagnosis not present

## 2020-07-29 LAB — HM MAMMOGRAPHY

## 2020-07-29 NOTE — Telephone Encounter (Signed)
Inbound call from patient stating omeprazole medication is no longer covered by her insurance and is wanting to know if alternate medication can be sent to pharmacy.  Please advise.

## 2020-07-30 NOTE — Telephone Encounter (Signed)
Pantoprazole 40 mg daily or other approved PPI is fine.

## 2020-07-31 MED ORDER — PANTOPRAZOLE SODIUM 40 MG PO TBEC: 40.0000 mg | DELAYED_RELEASE_TABLET | Freq: Every day | ORAL | 3 refills | Status: DC

## 2020-07-31 NOTE — Telephone Encounter (Signed)
Sent Pantoprazole 40mg  to pharmacy

## 2020-08-02 ENCOUNTER — Other Ambulatory Visit: Payer: Self-pay

## 2020-08-02 ENCOUNTER — Ambulatory Visit (INDEPENDENT_AMBULATORY_CARE_PROVIDER_SITE_OTHER): Payer: BC Managed Care – PPO

## 2020-08-02 DIAGNOSIS — R2242 Localized swelling, mass and lump, left lower limb: Secondary | ICD-10-CM

## 2020-08-02 DIAGNOSIS — I8392 Asymptomatic varicose veins of left lower extremity: Secondary | ICD-10-CM | POA: Diagnosis not present

## 2020-08-05 ENCOUNTER — Encounter: Payer: Self-pay | Admitting: Family Medicine

## 2020-08-05 ENCOUNTER — Telehealth: Payer: Self-pay | Admitting: Family Medicine

## 2020-08-05 DIAGNOSIS — R2241 Localized swelling, mass and lump, right lower limb: Secondary | ICD-10-CM

## 2020-08-05 DIAGNOSIS — R2242 Localized swelling, mass and lump, left lower limb: Secondary | ICD-10-CM

## 2020-08-05 NOTE — Telephone Encounter (Signed)
Referral to orthopedic oncology group at [facility] placed today

## 2020-08-05 NOTE — Progress Notes (Signed)
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  Again this does not look like cancer but it does look like a benign tumor on MRI.  However the radiologist cannot tell for sure and they recommend referral to a special orthopedic surgeon who deals with these kind of scenarios.  This is orthopedic oncology.  They deal with things like this as well as cancerous tumors of the bone and soft tissue.  They recommend the orthopedic surgeon do a biopsy to further evaluate this mass.  I have already placed the referral to the orthopedic oncology group at [facility].

## 2020-08-06 ENCOUNTER — Encounter: Payer: Self-pay | Admitting: Family Medicine

## 2020-08-25 DIAGNOSIS — R2242 Localized swelling, mass and lump, left lower limb: Secondary | ICD-10-CM | POA: Diagnosis not present

## 2020-08-26 DIAGNOSIS — R2242 Localized swelling, mass and lump, left lower limb: Secondary | ICD-10-CM | POA: Insufficient documentation

## 2020-08-31 ENCOUNTER — Ambulatory Visit: Payer: BC Managed Care – PPO | Admitting: Family Medicine

## 2020-09-30 ENCOUNTER — Other Ambulatory Visit: Payer: Self-pay

## 2020-09-30 ENCOUNTER — Ambulatory Visit (INDEPENDENT_AMBULATORY_CARE_PROVIDER_SITE_OTHER): Payer: BC Managed Care – PPO

## 2020-09-30 DIAGNOSIS — Z23 Encounter for immunization: Secondary | ICD-10-CM

## 2020-10-17 DIAGNOSIS — Z20822 Contact with and (suspected) exposure to covid-19: Secondary | ICD-10-CM | POA: Diagnosis not present

## 2020-10-23 ENCOUNTER — Other Ambulatory Visit: Payer: Self-pay | Admitting: Family Medicine

## 2020-11-03 ENCOUNTER — Ambulatory Visit: Payer: BC Managed Care – PPO | Admitting: Family Medicine

## 2020-11-03 ENCOUNTER — Encounter: Payer: Self-pay | Admitting: Family Medicine

## 2020-11-03 ENCOUNTER — Other Ambulatory Visit: Payer: Self-pay

## 2020-11-03 VITALS — BP 116/80 | HR 83 | Temp 97.8°F | Ht 69.0 in | Wt 249.4 lb

## 2020-11-03 DIAGNOSIS — F3342 Major depressive disorder, recurrent, in full remission: Secondary | ICD-10-CM

## 2020-11-03 DIAGNOSIS — I10 Essential (primary) hypertension: Secondary | ICD-10-CM

## 2020-11-03 DIAGNOSIS — M8949 Other hypertrophic osteoarthropathy, multiple sites: Secondary | ICD-10-CM

## 2020-11-03 DIAGNOSIS — Z23 Encounter for immunization: Secondary | ICD-10-CM | POA: Diagnosis not present

## 2020-11-03 DIAGNOSIS — G43109 Migraine with aura, not intractable, without status migrainosus: Secondary | ICD-10-CM

## 2020-11-03 DIAGNOSIS — M159 Polyosteoarthritis, unspecified: Secondary | ICD-10-CM

## 2020-11-03 MED ORDER — TRAMADOL HCL 50 MG PO TABS
50.0000 mg | ORAL_TABLET | Freq: Four times a day (QID) | ORAL | 0 refills | Status: AC | PRN
Start: 2020-11-03 — End: 2020-11-08

## 2020-11-03 NOTE — Progress Notes (Signed)
Phone [phone] In person visit   Subjective:   Tonya Suarez is a 50 y.o. year old very pleasant female patient who presents for/with See problem oriented charting Chief Complaint  Patient presents with   Depression   Hypertension   Migraine    This visit occurred during the SARS-CoV-2 public health emergency.  Safety protocols were in place, including screening questions prior to the visit, additional usage of staff PPE, and extensive cleaning of exam room while observing appropriate contact time as indicated for disinfecting solutions.   Past Medical History-  Patient Active Problem List   Diagnosis Date Noted   Former smoker 07/19/2018    Priority: High   Constipation 07/19/2018    Priority: Medium   History of adenomatous polyp of colon 09/23/2016    Priority: Medium   Migraines 05/06/2016    Priority: Medium   HTN (hypertension) 11/21/2011    Priority: Medium   Depression 09/06/2006    Priority: Medium   Restless legs 07/19/2018    Priority: Low   Ingrown toenail 12/20/2016    Priority: Low   Family history of cancer of GI tract 12/06/2012    Priority: Low   MENORRHAGIA 09/23/2009    Priority: Low   PLANTAR FASCIITIS 09/20/2007    Priority: Low   Osteoarthritis 09/19/2006    Priority: Low   Allergic rhinitis 09/06/2006    Priority: Low   GERD 09/06/2006    Priority: Low   Morbid obesity ([place]) 03/03/2020   B12 deficiency 07/19/2018    Medications- reviewed and updated Current Outpatient Medications  Medication Sig Dispense Refill   amLODipine (NORVASC) 2.5 MG tablet Take 1 tablet (2.5 mg total) by mouth daily. 90 tablet 3   diclofenac Sodium (VOLTAREN) 1 % GEL Apply 4 g topically 4 (four) times daily. To affected joint. 100 g 11   gabapentin (NEURONTIN) 300 MG capsule TAKE 2 CAPSULES BY MOUTH AT BEDTIME. 180 capsule 1   hydrochlorothiazide (HYDRODIURIL) 25 MG tablet Take 1 tablet (25 mg total) by mouth daily. 90 tablet 3   meloxicam (MOBIC)  7.5 MG tablet TAKE 1 TABLET (7.5 MG TOTAL) BY MOUTH DAILY AS NEEDED. FOR PAIN 90 tablet 1   pantoprazole (PROTONIX) 40 MG tablet Take 1 tablet (40 mg total) by mouth daily. 90 tablet 3   No current facility-administered medications for this visit.     Objective:  BP 116/80   Pulse 83   Temp 97.8 F (36.6 C) (Temporal)   Ht '5\' 9"'$  (1.753 m)   Wt 249 lb 6.4 oz (113.1 kg)   LMP 06/05/2018   SpO2 98%   BMI 36.83 kg/m  Gen: NAD, resting comfortably CV: RRR no murmurs rubs or gallops Lungs: CTAB no crackles, wheeze, rhonchi Abdomen: soft/nontender/nondistended/normal bowel sounds. No rebound or guarding.  Ext: no edema Skin: warm, dry Moves slowly due to hip pain    Assessment and Plan   # mass in leg- patient with a mass in the vastus lateralis muscle and also similar lesion near gastrocnemius muscles - has follow up with [facility] on the oct 18th to reevaluate -MRI on right hip prior to MR left femur did show tendinitis as potential source of pain. Topicals not effective like voltaren. Mobic only provides some relief. Has already tried tylenol.  -we will trial tramadol until ortho follow up - primarily before bed for sleep which has been hardest time for her  # Depression S: Medication: none at present Patient compliant with Paxil 10 mg  in past- trial counseling 2022  Depression screen [facility] 2/9 11/03/2020 03/03/2020 01/25/2019  Decreased Interest 0 0 0  Down, Depressed, Hopeless 0 2 0  PHQ - 2 Score 0 2 0  Altered sleeping '1 3 1  '$ Tired, decreased energy 0 2 0  Change in appetite 0 1 0  Feeling bad or failure about yourself  0 0 0  Trouble concentrating 0 1 0  Moving slowly or fidgety/restless 0 0 0  Suicidal thoughts 0 0 0  PHQ-9 Score '1 9 1  '$ Difficult doing work/chores Not difficult at all - Not difficult at all   A/P: full remission of depression without any medicine  #Hypertension S: Compliant with amlodipine 2.5 mg and hydrochlorothiazide 25 mg BP Readings from Last  3 Encounters:  11/03/20 116/80  06/25/20 104/79  04/28/20 120/72  A/P: Controlled. Continue current medications.    # Migraines with aura S: primarily uses [facility] migraine tablets- excedrin migraine (was taking in combo with sinus pill but that has decongestant so advised against)- perhaps 1-2 x every 2 weeks.  fiorcet in past- not at all recently A/P: Migraine thankfully overall are getting better.  The Excedrin Migraine formula from [employer] is reasonable-she takes 2 of these.  I do have some concern about the decongestant and the sinus medication-she could simply try an extra Tylenol 3 '25mg'$   and diphenhydramine 12.5 mg which are the other ingredients in addition to the migraine tablets.  #Low back pain issues-history of bulging disc L5-S1 with lumbar radiculopathy-followed by Dr. Georgina Snell- better lately  #Osteoarthritis- lumbar spine, knees, shoulders. Meloxicam helpful in the past as would like to avoid if possible due to hypertension- thankfully has tolerated  - update renal function today  Recommended follow up: schedule physical 1 year out from last physical  Lab/Order associations:   ICD-10-CM   1. Essential hypertension  I10     2. Recurrent major depressive disorder, in full remission ([place])  F33.42     3. Migraine with aura and without status migrainosus, not intractable  G43.109     4. Primary osteoarthritis involving multiple joints  M89.49      No orders of the defined types were placed in this encounter.  I,Harris Phan,acting as a Education administrator for Garret Reddish, MD.,have documented all relevant documentation on the behalf of Garret Reddish, MD,as directed by  Garret Reddish, MD while in the presence of Garret Reddish, MD.  I, Garret Reddish, MD, have reviewed all documentation for this visit. The documentation on 11/03/20 for the exam, diagnosis, procedures, and orders are all accurate and complete.  Return precautions advised.  Garret Reddish, MD

## 2020-11-03 NOTE — Patient Instructions (Addendum)
Health Maintenance Due  Topic Date Due   COVID-19 Vaccine (4 - Booster for [employer] series) Patient is waiting for the new Variant to come out in November.   Please get the Omicron variant booster only! When you get this please let me know. 06/19/2020   INFLUENZA VACCINE  Flu shot today before you leave.   09/21/2020   Please stop by lab before you go If you have mychart- we will send your results within 3 business days of [place] receiving them.  If you do not have mychart- we will call you about results within 5 business days of [place] receiving them.  *please also note that you will see labs on mychart as soon as they post. I will later go in and write notes on them- will say "notes from Dr. Yong Channel"  Trial Tramadol before Ortho follow-up primarily before bed since that has been hardest time for you. DO NOT DRIVE FOR 6-8 HOURS AFTER TAKING THIS MEDICATION.  Try Tylenol 325 mg and benadryl 12.5 mg in addition to your migraine tablets and see if we can avoid the deongestant  Recommended follow up: Please schedule a physical one year and one day out from last physical before you leave today

## 2020-11-04 LAB — COMPREHENSIVE METABOLIC PANEL
ALT: 40 U/L — ABNORMAL HIGH (ref 0–35)
AST: 57 U/L — ABNORMAL HIGH (ref 0–37)
Albumin: 4.1 g/dL (ref 3.5–5.2)
Alkaline Phosphatase: 69 U/L (ref 39–117)
BUN: 11 mg/dL (ref 6–23)
CO2: 27 mEq/L (ref 19–32)
Calcium: 9.6 mg/dL (ref 8.4–10.5)
Chloride: 102 mEq/L (ref 96–112)
Creatinine, Ser: 0.89 mg/dL (ref 0.40–1.20)
GFR: 74.66 mL/min (ref >60.00)
Glucose, Bld: 164 mg/dL — ABNORMAL HIGH (ref 70–99)
Potassium: 3.7 mEq/L (ref 3.5–5.1)
Sodium: 139 mEq/L (ref 135–145)
Total Bilirubin: 0.9 mg/dL (ref 0.2–1.2)
Total Protein: 6.8 g/dL (ref 6.0–8.3)

## 2020-11-04 LAB — CBC WITH DIFFERENTIAL/PLATELET
Basophils Absolute: 0.1 10*3/uL (ref 0.0–0.1)
Basophils Relative: 0.6 % (ref 0.0–3.0)
Eosinophils Absolute: 0.3 10*3/uL (ref 0.0–0.7)
Eosinophils Relative: 2.9 % (ref 0.0–5.0)
HCT: 42 % (ref 36.0–46.0)
Hemoglobin: 14.7 g/dL (ref 12.0–15.0)
Lymphocytes Relative: 34.8 % (ref 12.0–46.0)
Lymphs Abs: 3.5 10*3/uL (ref 0.7–4.0)
MCHC: 35.1 g/dL (ref 30.0–36.0)
MCV: 87.9 fl (ref 78.0–100.0)
Monocytes Absolute: 0.5 10*3/uL (ref 0.1–1.0)
Monocytes Relative: 4.7 % (ref 3.0–12.0)
Neutro Abs: 5.8 10*3/uL (ref 1.4–7.7)
Neutrophils Relative %: 57 % (ref 43.0–77.0)
Platelets: 206 10*3/uL (ref 150.0–400.0)
RBC: 4.78 Mil/uL (ref 3.87–5.11)
RDW: 14.6 % (ref 11.5–15.5)
WBC: 10.2 10*3/uL (ref 4.0–10.5)

## 2020-12-08 DIAGNOSIS — M5432 Sciatica, left side: Secondary | ICD-10-CM | POA: Insufficient documentation

## 2020-12-08 DIAGNOSIS — M5416 Radiculopathy, lumbar region: Secondary | ICD-10-CM | POA: Diagnosis not present

## 2020-12-08 DIAGNOSIS — M7602 Gluteal tendinitis, left hip: Secondary | ICD-10-CM | POA: Diagnosis not present

## 2020-12-08 DIAGNOSIS — R2242 Localized swelling, mass and lump, left lower limb: Secondary | ICD-10-CM | POA: Diagnosis not present

## 2020-12-08 DIAGNOSIS — M1712 Unilateral primary osteoarthritis, left knee: Secondary | ICD-10-CM | POA: Diagnosis not present

## 2020-12-08 DIAGNOSIS — M7062 Trochanteric bursitis, left hip: Secondary | ICD-10-CM | POA: Diagnosis not present

## 2020-12-31 DIAGNOSIS — M4726 Other spondylosis with radiculopathy, lumbar region: Secondary | ICD-10-CM | POA: Diagnosis not present

## 2021-01-04 DIAGNOSIS — M5416 Radiculopathy, lumbar region: Secondary | ICD-10-CM | POA: Diagnosis not present

## 2021-02-24 ENCOUNTER — Ambulatory Visit: Payer: BC Managed Care – PPO | Admitting: Podiatry

## 2021-02-25 ENCOUNTER — Encounter: Payer: Self-pay | Admitting: Family Medicine

## 2021-02-25 ENCOUNTER — Telehealth (INDEPENDENT_AMBULATORY_CARE_PROVIDER_SITE_OTHER): Payer: BC Managed Care – PPO | Admitting: Family Medicine

## 2021-02-25 DIAGNOSIS — U071 COVID-19: Secondary | ICD-10-CM | POA: Diagnosis not present

## 2021-02-25 DIAGNOSIS — Z20822 Contact with and (suspected) exposure to covid-19: Secondary | ICD-10-CM | POA: Diagnosis not present

## 2021-02-25 MED ORDER — BENZONATATE 100 MG PO CAPS: ORAL_CAPSULE | ORAL | 0 refills | Status: DC

## 2021-02-25 MED ORDER — NIRMATRELVIR/RITONAVIR (PAXLOVID)TABLET: 3.0000 | ORAL_TABLET | Freq: Two times a day (BID) | ORAL | 0 refills | Status: AC

## 2021-02-25 NOTE — Progress Notes (Signed)
Virtual Visit via Video Note  I connected with Cola  on 02/25/21 at  6:00 PM EST by a video enabled telemedicine application and verified that I am speaking with the correct person using two identifiers.  Location patient: [place] Location provider:work or home office Persons participating in the virtual visit: patient, provider  I discussed the limitations and requested verbal permission for telemedicine visit. The patient expressed understanding and agreed to proceed.   HPI:  Acute telemedicine visit for Covid19: -Onset:3 days ago, tested positive for covid -Symptoms include: nasal congestion, HA, cough, scratchy throat, body aches, diarrhea, mild sob at times -Denies:fevers, CP -eating and drinking, can get up and down -Pertinent past medical history: see below, has had covid twice before -GFR was 74 on last check -Pertinent medication allergies: Allergies  Allergen Reactions   Amoxicillin Swelling    Lip swelling  -COVID-19 vaccine status: 2 doses and a booster Immunization History  Administered Date(s) Administered   Influenza Split 11/19/2007, 12/03/2008, 11/21/2011, 11/22/2018   Influenza Whole 11/19/2007, 12/03/2008, 11/22/2018   Influenza,inj,Quad PF,6+ Mos 11/21/2012, 12/24/2015, 11/03/2020   Influenza,inj,quad, With Preservative 12/18/2017   Influenza-Unspecified 12/17/2014, 12/04/2019   PFIZER Comirnaty(Gray Top)Covid-19 Tri-Sucrose Vaccine 05/30/2019, 06/26/2019, 02/19/2020   PFIZER(Purple Top)SARS-COV-2 Vaccination 05/30/2019, 06/26/2019, 02/19/2020   Td 02/21/2001   Tdap 11/21/2011   Zoster Recombinat (Shingrix) 03/03/2020, 09/30/2020    ROS: See pertinent positives and negatives per HPI.  Past Medical History:  Diagnosis Date   Allergy    Arthritis    Chronic headaches    Depression    GERD (gastroesophageal reflux disease)    Hypertension    Low back pain    UTI (urinary tract infection)     Past Surgical History:  Procedure Laterality Date   TUBAL  LIGATION       Current Outpatient Medications:    amLODipine (NORVASC) 2.5 MG tablet, Take 1 tablet (2.5 mg total) by mouth daily., Disp: 90 tablet, Rfl: 3   benzonatate (TESSALON PERLES) 100 MG capsule, 1-2 capsules up to twice daily as needed for cough, Disp: 20 capsule, Rfl: 0   cyclobenzaprine (FLEXERIL) 5 MG tablet, Take by mouth., Disp: , Rfl:    diclofenac Sodium (VOLTAREN) 1 % GEL, Apply 4 g topically 4 (four) times daily. To affected joint., Disp: 100 g, Rfl: 11   gabapentin (NEURONTIN) 300 MG capsule, TAKE 2 CAPSULES BY MOUTH AT BEDTIME., Disp: 180 capsule, Rfl: 1   hydrochlorothiazide (HYDRODIURIL) 25 MG tablet, Take 1 tablet (25 mg total) by mouth daily., Disp: 90 tablet, Rfl: 3   meloxicam (MOBIC) 7.5 MG tablet, TAKE 1 TABLET (7.5 MG TOTAL) BY MOUTH DAILY AS NEEDED. FOR PAIN, Disp: 90 tablet, Rfl: 1   nirmatrelvir/ritonavir EUA (PAXLOVID) 20 x 150 MG & 10 x 100MG  TABS, Take 3 tablets by mouth 2 (two) times daily for 5 days. (Take nirmatrelvir 150 mg two tablets twice daily for 5 days and ritonavir 100 mg one tablet twice daily for 5 days) Patient GFR is > 60 on last check, Disp: 30 tablet, Rfl: 0   pantoprazole (PROTONIX) 40 MG tablet, Take 1 tablet (40 mg total) by mouth daily., Disp: 90 tablet, Rfl: 3  EXAM:  VITALS per patient if applicable:  GENERAL: alert, oriented, appears well and in no acute distress  HEENT: atraumatic, conjunttiva clear, no obvious abnormalities on inspection of external nose and ears  NECK: normal movements of the head and neck  LUNGS: on inspection no signs of respiratory distress, breathing rate appears normal, no  obvious gross SOB, gasping or wheezing  CV: no obvious cyanosis  MS: moves all visible extremities without noticeable abnormality  PSYCH/NEURO: pleasant and cooperative, no obvious depression or anxiety, speech and thought processing grossly intact  ASSESSMENT AND PLAN:  Discussed the following assessment and plan:  COVID-19    Discussed treatment options and risk of drug interactions, ideal treatment window, potential complications, isolation and precautions for COVID-19.  Discussed possibility of rebound with antivirals and the need to reisolate if it should occur for 5 days. Checked for/reviewed GFR listed in HPI if available. After lengthy discussion, the patient opted for treatment with PAxlovid due to being higher risk for complications of covid or severe disease and other factors. Discussed EUA status of this drug and the fact that there is preliminary limited knowledge of risks/interactions/side effects per EUA document vs possible benefits and precautions. This information was shared with patient during the visit and also was provided in patient instructions. Also, advised that patient discuss risks/interactions and use with pharmacist/treatment team as well. he patient did want a prescription for cough, Tessalon Rx sent.  Other symptomatic care measures summarized in patient instructions. Work/School slipped offered: provided in patient instructions   Advised to seek prompt virtual visit or in person care if worsening, new symptoms arise, or if is not improving with treatment as expected per our conversation of expected course. Discussed options for follow up care. Did let this patient know that I do telemedicine on Tuesdays and Thursdays for [facility] and those are the days I am logged into the system. Advised to schedule follow up visit with PCP, [place] virtual visits or [facility] if any further questions or concerns to avoid delays in care.   I discussed the assessment and treatment plan with the patient. The patient was provided an opportunity to ask questions and all were answered. The patient agreed with the plan and demonstrated an understanding of the instructions.     Lucretia Kern, DO

## 2021-02-25 NOTE — Patient Instructions (Addendum)
---------------------------------------------------------------------------------------------------------------------------    WORK SLIP:  Patient Tonya Suarez,  Mar 09, 1970, was seen for a medical visit today, 02/25/21 . Please excuse from work for a COVID/flu like illness. If Covid19 testing is positive advise 10 days minimum from the onset of symptoms (02/23/20) PLUS 1 day of no fever and improved symptoms. Will defer to employer for a sooner return to work if patient has 2 negative covid tests 48 hours apart and is feeling better, or if symptoms have resolved, it is greater than 5 days since the positive test and the patient can wear a high-quality, tight fitting mask such as N95 or KN95 at all times for an additional 5 days.   Sincerely: E-signature: Dr. Kriste Basque, DO [facility] Ph: [phone]   ------------------------------------------------------------------------------------------------------------------------------     HOME CARE TIPS:  -COVID19 testing information: [email or web address]  Most pharmacies also offer testing and home test kits. If the Covid19 test is positive and you desire antiviral treatment, please contact a [place] pharmacy or schedule a follow up virtual visit through your primary care office or through the [employer].  Other test to treat options: [email or web address]-[email or web address]  -I sent the medication(s) we discussed to your pharmacy: Meds ordered this encounter  Medications   nirmatrelvir/ritonavir EUA (PAXLOVID) 20 x 150 MG & 10 x 100MG  TABS    Sig: Take 3 tablets by mouth 2 (two) times daily for 5 days. (Take nirmatrelvir 150 mg two tablets twice daily for 5 days and ritonavir 100 mg one tablet twice daily for 5 days) Patient GFR is > 60 on last check    Dispense:  30 tablet    Refill:  0   benzonatate (TESSALON PERLES) 100 MG capsule    Sig: 1-2 capsules  up to twice daily as needed for cough    Dispense:  20 capsule    Refill:  0     -I sent in the Covid19 treatment or referral you requested per our discussion. Please see the information provided below and discuss further with the pharmacist/treatment team.  -If taking Paxlovid, please review all medications, supplement and over the counter drugs with your pharmacist and ask them to check for any interactions.  -there is a chance of rebound illness after finishing your treatment. If you become sick again please isolate for an additional 5 days, plus 5 more days of masking.   -can use tylenol or aleve if needed for fevers, aches and pains per instructions  -can use nasal saline a few times per day if you have nasal congestion  -stay hydrated, drink plenty of fluids and eat small healthy meals - avoid dairy  -stay home while sick, except to seek medical care. If you have COVID19, you will likely be contagious for 7-10 days. Flu or Influenza is likely contagious for about 7 days. Other respiratory viral infections remain contagious for 5-10+ days depending on the virus and many other factors. Wear a good mask that fits snugly (such as N95 or KN95) if around others to reduce the risk of transmission.  It was nice to meet you today, and I really hope you are feeling better soon. I help [employer] out with telemedicine visits on Tuesdays and Thursdays and am happy to help if you need a follow up virtual visit on those days. Otherwise, if you have any concerns or questions following this visit please schedule a follow up visit with your Primary Care doctor or seek care at a local urgent care  clinic to avoid delays in care.    Seek in person care or schedule a follow up video visit promptly if your symptoms worsen, new concerns arise or you are not improving with treatment. Call 911 and/or seek emergency care if your symptoms are severe or life threatening.  FACT SHEET FOR PATIENTS, PARENTS, AND  CAREGIVERS EMERGENCY USE AUTHORIZATION (EUA) OF PAXLOVID FOR CORONAVIRUS DISEASE 2019 (COVID-19) You are being given this Fact Sheet because your healthcare provider believes it is necessary to provide you with PAXLOVID for the treatment of mild-to-moderate coronavirus disease (COVID-19) caused by the SARS-CoV-2 virus. This Fact Sheet contains information to help you understand the risks and benefits of taking the PAXLOVID you have received or may receive. The U.S. Food and Drug Administration (FDA) has issued an Emergency Use Authorization (EUA) to make PAXLOVID available during the COVID-19 pandemic (for more details about an EUA please see What is an Emergency Use Authorization? at the end of this document). PAXLOVID is not an FDA-approved medicine in the [place]. Read this Fact Sheet for information about PAXLOVID. Talk to your healthcare provider about your options or if you have any questions. It is your choice to take PAXLOVID.  What is COVID-19? COVID-19 is caused by a virus called a coronavirus. You can get COVID-19 through close contact with another person who has the virus. COVID-19 illnesses have ranged from very mild-to-severe, including illness resulting in death. While information so far suggests that most COVID-19 illness is mild, serious illness can happen and may cause some of your other medical conditions to become worse. Older people and people of all ages with severe, long lasting (chronic) medical conditions like heart disease, lung disease, and diabetes, for example seem to be at higher risk of being hospitalized for COVID-19.  What is PAXLOVID? PAXLOVID is an investigational medicine used to treat mild-to-moderate COVID-19 in adults and children [53 years of age and older weighing at least 85 pounds (6 kg)] with positive results of direct SARS-CoV-2 viral testing, and who are at high risk for progression to severe COVID-19, including hospitalization or  death. PAXLOVID is investigational because it is still being studied. There is limited information about the safety and effectiveness of using PAXLOVID to treat people with mild-to-moderate COVID-19.  The FDA has authorized the emergency use of PAXLOVID for the treatment of mild-tomoderate COVID-19 in adults and children [7 years of age and older weighing at least 57 pounds (37 kg)] with a positive test for the virus that causes COVID-19, and who are at high risk for progression to severe COVID-19, including hospitalization or death, under an EUA. 1 Revised: 08 May 2020   What should I tell my healthcare provider before I take PAXLOVID? Tell your healthcare provider if you: ? Have any allergies ? Have liver or kidney disease ? Are pregnant or plan to become pregnant ? Are breastfeeding a child ? Have any serious illnesses  Tell your healthcare provider about all the medicines you take, including prescription and over-the-counter medicines, vitamins, and herbal supplements. Some medicines may interact with PAXLOVID and may cause serious side effects. Keep a list of your medicines to show your healthcare provider and pharmacist when you get a new medicine.  You can ask your healthcare provider or pharmacist for a list of medicines that interact with PAXLOVID. Do not start taking a new medicine without telling your healthcare provider. Your healthcare provider can tell you if it is safe to take PAXLOVID with other medicines.  Tell  your healthcare provider if you are taking combined hormonal contraceptive. PAXLOVID may affect how your birth control pills work. Females who are able to become pregnant should use another effective alternative form of contraception or an additional barrier method of contraception. Talk to your healthcare provider if you have any questions about contraceptive methods that might be right for you.  How do I take PAXLOVID? ? PAXLOVID consists of 2  medicines: nirmatrelvir and ritonavir. o Take 2 pink tablets of nirmatrelvir with 1 white tablet of ritonavir by mouth 2 times each day (in the morning and in the evening) for 5 days. For each dose, take all 3 tablets at the same time. o If you have kidney disease, talk to your healthcare provider. You may need a different dose. ? Swallow the tablets whole. Do not chew, break, or crush the tablets. ? Take PAXLOVID with or without food. ? Do not stop taking PAXLOVID without talking to your healthcare provider, even if you feel better. ? If you miss a dose of PAXLOVID within 8 hours of the time it is usually taken, take it as soon as you remember. If you miss a dose by more than 8 hours, skip the missed dose and take the next dose at your regular time. Do not take 2 doses of PAXLOVID at the same time. ? If you take too much PAXLOVID, call your healthcare provider or go to the nearest hospital emergency room right away. ? If you are taking a ritonavir- or cobicistat-containing medicine to treat hepatitis C or Human Immunodeficiency Virus (HIV), you should continue to take your medicine as prescribed by your healthcare provider. 2 Revised: 08 May 2020    Talk to your healthcare provider if you do not feel better or if you feel worse after 5 days.  Who should generally not take PAXLOVID? Do not take PAXLOVID if: ? You are allergic to nirmatrelvir, ritonavir, or any of the ingredients in PAXLOVID. ? You are taking any of the following medicines: o Alfuzosin o Pethidine, propoxyphene o Ranolazine o Amiodarone, dronedarone, flecainide, propafenone, quinidine o Colchicine o Lurasidone, pimozide, clozapine o Dihydroergotamine, ergotamine, methylergonovine o Lovastatin, simvastatin o Sildenafil (Revatio) for pulmonary arterial hypertension (PAH) o Triazolam, oral midazolam o Apalutamide o Carbamazepine, phenobarbital, phenytoin o Rifampin o St. Johns Wort (hypericum  perforatum) Taking PAXLOVID with these medicines may cause serious or life-threatening side effects or affect how PAXLOVID works.  These are not the only medicines that may cause serious side effects if taken with PAXLOVID. PAXLOVID may increase or decrease the levels of multiple other medicines. It is very important to tell your healthcare provider about all of the medicines you are taking because additional laboratory tests or changes in the dose of your other medicines may be necessary while you are taking PAXLOVID. Your healthcare provider may also tell you about specific symptoms to watch out for that may indicate that you need to stop or decrease the dose of some of your other medicines.  What are the important possible side effects of PAXLOVID? Possible side effects of PAXLOVID are: ? Allergic Reactions. Allergic reactions can happen in people taking PAXLOVID, even after only 1 dose. Stop taking PAXLOVID and call your healthcare provider right away if you get any of the following symptoms of an allergic reaction: o hives o trouble swallowing or breathing o swelling of the mouth, lips, or face o throat tightness o hoarseness 3 Revised: 08 May 2020  o skin rash ? Liver Problems. Tell your  healthcare provider right away if you have any of these signs and symptoms of liver problems: loss of appetite, yellowing of your skin and the whites of eyes (jaundice), dark-colored urine, pale colored stools and itchy skin, stomach area (abdominal) pain. ? Resistance to HIV Medicines. If you have untreated HIV infection, PAXLOVID may lead to some HIV medicines not working as well in the future. ? Other possible side effects include: o altered sense of taste o diarrhea o high blood pressure o muscle aches These are not all the possible side effects of PAXLOVID. Not many people have taken PAXLOVID. Serious and unexpected side effects may happen. PAXLOVID is still being studied, so it is  possible that all of the risks are not known at this time.  What other treatment choices are there? Veklury (remdesivir) is FDA-approved for the treatment of mild-to-moderate NUUVO-53 in certain adults and children. Talk with your doctor to see if Marijean Heath is appropriate for you. Like PAXLOVID, FDA may also allow for the emergency use of other medicines to treat people with COVID-19. Go to https://price.info/ for information on the emergency use of other medicines that are authorized by FDA to treat people with COVID-19. Your healthcare provider may talk with you about clinical trials for which you may be eligible. It is your choice to be treated or not to be treated with PAXLOVID. Should you decide not to receive it or for your child not to receive it, it will not change your standard medical care.  What if I am pregnant or breastfeeding? There is no experience treating pregnant women or breastfeeding mothers with PAXLOVID. For a mother and unborn baby, the benefit of taking PAXLOVID may be greater than the risk from the treatment. If you are pregnant, discuss your options and specific situation with your healthcare provider. It is recommended that you use effective barrier contraception or do not have sexual activity while taking PAXLOVID. If you are breastfeeding, discuss your options and specific situation with your healthcare provider. 4 Revised: 08 May 2020   How do I report side effects with PAXLOVID? Contact your healthcare provider if you have any side effects that bother you or do not go away. Report side effects to FDA MedWatch at SmoothHits.hu or call 1-800-FDA1088 or you can report side effects to [employer]. at the contact information provided below. Website Fax number Telephone number www.pfizersafetyreporting.com [phone] How should I  store [place]? Store PAXLOVID tablets at room temperature, between 68?F to 77?F (20?C to 25?C). How can I learn more about COVID-19? ? Ask your healthcare provider. ? Visit https://jacobson-johnson.com/. ? Contact your local or state public health department. What is an Emergency Use Authorization (EUA)? The [place] FDA has made PAXLOVID available under an emergency access mechanism called an Emergency Use Authorization (EUA). The EUA is supported by a Education officer, museum and Human Service (HHS) declaration that circumstances exist to justify the emergency use of drugs and biological products during the COVID-19 pandemic. PAXLOVID for the treatment of mild-to-moderate COVID-19 in adults and children [10 years of age and older weighing at least 28 pounds (32 kg)] with positive results of direct SARS-CoV-2 viral testing, and who are at high risk for progression to severe COVID-19, including hospitalization or death, has not undergone the same type of review as an FDA-approved product. In issuing an EUA under the IRJJO-84 public health emergency, the FDA has determined, among other things, that based on the total amount of scientific evidence available including data from adequate  and well-controlled clinical trials, if available, it is reasonable to believe that the product may be effective for diagnosing, treating, or preventing COVID-19, or a serious or life-threatening disease or condition caused by COVID-19; that the known and potential benefits of the product, when used to diagnose, treat, or prevent such disease or condition, outweigh the known and potential risks of such product; and that there are no adequate, approved, and available alternatives. All of these criteria must be met to allow for the product to be used in the treatment of patients during the COVID-19 pandemic. The EUA for PAXLOVID is in effect for the duration of the COVID-19 declaration justifying emergency use of  this product, unless terminated or revoked (after which the products may no longer be used under the EUA). 5 Revised: 08 May 2020     Additional Information For general questions, visit the website or call the telephone number provided below. Website Telephone number www.COVID19oralRx.com [phone] (1-877-C19-PACK) You can also go to www.pfizermedinfo.com or call [phone] for more information. UJN-4068-4.0 Revised: 08 May 2020

## 2021-03-04 ENCOUNTER — Other Ambulatory Visit: Payer: Self-pay

## 2021-03-04 ENCOUNTER — Ambulatory Visit: Payer: Self-pay

## 2021-03-04 ENCOUNTER — Ambulatory Visit (INDEPENDENT_AMBULATORY_CARE_PROVIDER_SITE_OTHER): Payer: BC Managed Care – PPO

## 2021-03-04 ENCOUNTER — Ambulatory Visit: Payer: BC Managed Care – PPO | Admitting: Family Medicine

## 2021-03-04 VITALS — BP 108/74 | HR 83 | Ht 69.0 in | Wt 246.8 lb

## 2021-03-04 DIAGNOSIS — G8929 Other chronic pain: Secondary | ICD-10-CM

## 2021-03-04 DIAGNOSIS — M47812 Spondylosis without myelopathy or radiculopathy, cervical region: Secondary | ICD-10-CM | POA: Diagnosis not present

## 2021-03-04 DIAGNOSIS — M25511 Pain in right shoulder: Secondary | ICD-10-CM

## 2021-03-04 DIAGNOSIS — M542 Cervicalgia: Secondary | ICD-10-CM

## 2021-03-04 DIAGNOSIS — M19011 Primary osteoarthritis, right shoulder: Secondary | ICD-10-CM | POA: Diagnosis not present

## 2021-03-04 MED ORDER — PREDNISONE 50 MG PO TABS: ORAL_TABLET | ORAL | 0 refills | Status: DC

## 2021-03-04 NOTE — Progress Notes (Signed)
I, Peterson Lombard, LAT, ATC acting as a scribe for [name], MD.  Tonya Suarez is a 52 y.o. female who presents to [place] at [facility] today for R shoulder pain. Pt was previously seen by Dr. Georgina Snell on 06/25/20 for R hip pain. Today, pt c/o R shoulder pain intermittently for 4-5 months, worsening over the last month. Pt locates pain to the anterior aspect of the R shoulder.  Neck pain: yes- R-side into R trapz Radiates: no Mechanical symptoms: yes UE Numbness/tingling: yes- slight, ulnar aspect UE Weakness: no Aggravates: laying on R side,  Treatments tried: Voltaren gel, heat, ice,   Pertinent review of systems: No fevers or chills  Relevant historical information: Hypertension   Exam:  BP 108/74    Pulse 83    Ht 5\' 9"  (1.941 m)    Wt 246 lb 12.8 oz (111.9 kg)    LMP 06/05/2018    SpO2 97%    BMI 36.45 kg/m  General: Well Developed, well nourished, and in no acute distress.   MSK:  C-spine: Normal. Nontender midline. Normal cervical motion. Mildly positive right-sided Spurling's test. Upper extremity strength is intact. Reflexes are intact. Right elbow nontender and no positive Tinel's at cubital tunnel.  Right shoulder normal-appearing Tender palpation mildly at [facility] joint. Normal shoulder motion. Negative impingement testing. Positive crossover arm compression test.    Lab and Radiology Results No results found for this or any previous visit (from the past 72 hour(s)). DG Cervical Spine Complete  Result Date: 03/05/2021 CLINICAL DATA:  Right anterior shoulder pain with limited range of motion for months No known injury EXAM: CERVICAL SPINE - COMPLETE 4+ VIEW COMPARISON:  09/19/2016 FINDINGS: Alignment is within normal limits. No significant disc or vertebral body height loss. Mild right neural foraminal stenosis at C3-C4 and C4-C5. Mild left neural foraminal stenosis at C3-C4. Facet degenerative changes seen throughout the cervical  spine. IMPRESSION: Mild degenerative changes of the cervical spine as above. Electronically Signed   By: Miachel Roux M.D.   On: 03/05/2021 06:26   DG Shoulder Right  Result Date: 03/05/2021 CLINICAL DATA:  Right anterior shoulder pain with limited range of motion for months No known injury EXAM: RIGHT SHOULDER - 2+ VIEW COMPARISON:  None. FINDINGS: Mild joint space loss of the acromioclavicular joint. No fracture or dislocation. Soft tissues are unremarkable. IMPRESSION: Mild degenerative changes of the right shoulder. Electronically Signed   By: Miachel Roux M.D.   On: 03/05/2021 06:24     I, [name], personally (independently) visualized and performed the interpretation of the images attached in this note.   Assessment and Plan: 51 y.o. female with right arm pain.  Multifactorial.  Patient has paresthesias into the ulnar hand thought to be possible C8 radiculopathy.  Additionally she has some anterior shoulder pain thought possibly be due to [facility] joint related or anterior shoulder impingement. Plan for trial of prednisone and continued gabapentin and reassess in 1 month.  If not improved proceed with further work-up including possible MRI.  Also consider possible physical therapy in the future.  X-ray C-spine and shoulder obtained today.   PDMP not reviewed this encounter. Orders Placed This Encounter  Procedures   [place] LIMITED JOINT SPACE STRUCTURES UP RIGHT(NO LINKED CHARGES)    Standing Status:   Future    Number of Occurrences:   1    Standing Expiration Date:   09/01/2021    Order Specific Question:   Reason for Exam (SYMPTOM  OR DIAGNOSIS REQUIRED)    Answer:   right shoulder pain    Order Specific Question:   Preferred imaging location?    Answer:   [place]   DG Shoulder Right    Standing Status:   Future    Number of Occurrences:   1    Standing Expiration Date:   03/04/2022    Order Specific Question:   Reason for Exam (SYMPTOM  OR DIAGNOSIS  REQUIRED)    Answer:   right shoulder pain    Order Specific Question:   Preferred imaging location?    Answer:   [facility]    Order Specific Question:   Is patient pregnant?    Answer:   No   DG Cervical Spine Complete    Standing Status:   Future    Number of Occurrences:   1    Standing Expiration Date:   03/04/2022    Order Specific Question:   Reason for Exam (SYMPTOM  OR DIAGNOSIS REQUIRED)    Answer:   neck pain    Order Specific Question:   Preferred imaging location?    Answer:   [facility]    Order Specific Question:   Is patient pregnant?    Answer:   No   Meds ordered this encounter  Medications   predniSONE (DELTASONE) 50 MG tablet    Sig: Take 1 pill daily for 5 days    Dispense:  5 tablet    Refill:  0     Discussed warning signs or symptoms. Please see discharge instructions. Patient expresses understanding.   The above documentation has been reviewed and is accurate and complete [name], M.D.

## 2021-03-04 NOTE — Patient Instructions (Addendum)
Thank you for coming in today.   Please get an Xray today before you leave   I've sent the predisone to your pharmacy  Recheck back in 1 month

## 2021-03-05 NOTE — Progress Notes (Signed)
Shoulder x-ray shows some mild arthritis changes at the [facility] joint.  This is one of the things that I think was hurting you.  We may consider an injection of this area if not improved in a month.

## 2021-03-05 NOTE — Progress Notes (Signed)
Cervical spine x-ray shows some arthritis changes.  If not improved an MRI may be helpful.

## 2021-03-08 ENCOUNTER — Ambulatory Visit: Payer: BC Managed Care – PPO | Admitting: Podiatry

## 2021-03-08 ENCOUNTER — Other Ambulatory Visit: Payer: Self-pay

## 2021-03-08 DIAGNOSIS — B353 Tinea pedis: Secondary | ICD-10-CM

## 2021-03-08 MED ORDER — TERBINAFINE HCL 250 MG PO TABS: 250.0000 mg | ORAL_TABLET | Freq: Every day | ORAL | 0 refills | Status: DC

## 2021-03-08 MED ORDER — CLOTRIMAZOLE-BETAMETHASONE 1-0.05 % EX CREA: 1.0000 "application " | TOPICAL_CREAM | Freq: Two times a day (BID) | CUTANEOUS | 1 refills | Status: DC

## 2021-03-08 NOTE — Progress Notes (Signed)
° °  HPI: 51 y.o. female presenting today as a reestablish new patient for evaluation of dry peeling skin to the bilateral weightbearing surfaces of the feet.  Patient states that she has peeling dry skin chronically.  She applies lotion every day with no improvement.  She is also states that she does not go barefoot.  This is been ongoing for several years.  She would like to have it addressed and evaluated  Also in 2019 the patient had partial nail matricectomy to the medial border of the right great toe.  Patient states that she believes the portion of the nail is growing back.  She would like to have that evaluated as well  Past Medical History:  Diagnosis Date   Allergy    Arthritis    Chronic headaches    Depression    GERD (gastroesophageal reflux disease)    Hypertension    Low back pain    UTI (urinary tract infection)     Past Surgical History:  Procedure Laterality Date   TUBAL LIGATION      Allergies  Allergen Reactions   Amoxicillin Swelling    Lip swelling     Physical Exam: General: The patient is alert and oriented x3 in no acute distress.  Dermatology: Skin is warm, dry and supple bilateral lower extremities. Negative for open lesions or macerations.  There is some diffuse hyperkeratosis of skin with peeling noted throughout the weightbearing surfaces of the feet especially  Nail spicule also noted to the medial border of the right great toe at the base.  She says it is not necessarily painful but she notices that it is there and it is bothersome.  Vascular: Palpable pedal pulses bilaterally. Capillary refill within normal limits.  Negative for any significant edema or erythema  Neurological: Light touch and protective threshold grossly intact  Musculoskeletal Exam: No pedal deformities noted   Assessment: 1.  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  Chronic tinea pedis bilateral   Plan of Care:  1. Patient  evaluated.  2.  Prescription for Lotrisone cream applied daily to the weightbearing surfaces of the feet 3.  Prescription for short course of oral Lamisil 250 mg daily #30 to see if this helps improve the skin and resolve any chronic tinea pedis of the feet 4.  Today I did discuss revisional nail matricectomy and removing the nail spicule and applying phenol.  The patient would like to have this done on a Friday.  She is going to set up a return appointment for Friday to have this procedure performed with another physician since I am not here on Fridays.      Edrick Kins, DPM [facility]  Dr. Edrick Kins, DPM    [address], [place] [address]                Office [phone]  Fax [phone]

## 2021-03-09 ENCOUNTER — Other Ambulatory Visit: Payer: Self-pay | Admitting: Family Medicine

## 2021-03-09 DIAGNOSIS — I10 Essential (primary) hypertension: Secondary | ICD-10-CM

## 2021-03-26 ENCOUNTER — Ambulatory Visit: Payer: BC Managed Care – PPO | Admitting: Podiatry

## 2021-04-08 ENCOUNTER — Ambulatory Visit: Payer: BC Managed Care – PPO | Admitting: Family Medicine

## 2021-04-20 ENCOUNTER — Ambulatory Visit: Payer: BC Managed Care – PPO | Admitting: Family Medicine

## 2021-04-20 ENCOUNTER — Other Ambulatory Visit: Payer: Self-pay

## 2021-04-20 ENCOUNTER — Encounter: Payer: Self-pay | Admitting: Family Medicine

## 2021-04-20 ENCOUNTER — Ambulatory Visit: Payer: Self-pay

## 2021-04-20 VITALS — BP 108/72 | HR 79 | Ht 69.0 in | Wt 245.6 lb

## 2021-04-20 DIAGNOSIS — G8929 Other chronic pain: Secondary | ICD-10-CM

## 2021-04-20 DIAGNOSIS — M542 Cervicalgia: Secondary | ICD-10-CM

## 2021-04-20 DIAGNOSIS — M25511 Pain in right shoulder: Secondary | ICD-10-CM | POA: Diagnosis not present

## 2021-04-20 NOTE — Progress Notes (Signed)
I, [name], LAT, ATC, am serving as scribe for Dr. [name].  Tonya Suarez is a 51 y.o. female who presents to [facility] at [facility] today for f/u R shoulder, neck pain, and R paresthesias into the ulnar aspect of the hand. Pt was last seen by Dr. Georgina Snell on 03/04/21 and was prescribed prednisone and advised to cont gabapentin. Today, pt reports that the prednisone helped w/ her symptoms for about 3 weeks but her pain has slowly returned. She locates her pain to her R ant shoulder w/ radiating pain to her R upper arm.  She notes R shoulder mechanical symptoms/popping.  Dx imaging: 03/04/21 C-spine & R shoulder XR  Pertinent review of systems: No fevers or chills  Relevant historical information: Migraine history   Exam:  BP 108/72 (BP Location: Right Arm, Patient Position: Sitting, Cuff Size: Large)    Pulse 79    Ht 5\' 9"  (1.753 m)    Wt 245 lb 9.6 oz (111.4 kg)    LMP 06/05/2018    SpO2 96%    BMI 36.27 kg/m  General: Well Developed, well nourished, and in no acute distress.   MSK: Right shoulder: Normal-appearing Nontender. Pain with abduction and functional internal rotation. Strength is intact. Positive Hawkins and Neer's test.  Negative Yergason's and speeds test.  C-spine: Normal. Nontender midline. Negative Spurling's test.  Upper extremity strength is intact. Reflexes are intact.    Lab and Radiology Results  Procedure: Real-time Ultrasound Guided Injection of right shoulder subacromial bursa Device: Philips Affiniti 50G Images permanently stored and available for review in PACS Verbal informed consent obtained.  Discussed risks and benefits of procedure. Warned about infection bleeding damage to structures skin hypopigmentation and fat atrophy among others. Patient expresses understanding and agreement Time-out conducted.   Noted no overlying erythema, induration, or other signs of local infection.   Skin prepped in a sterile  fashion.   Local anesthesia: Topical Ethyl chloride.   With sterile technique and under real time ultrasound guidance: 40 mg of Kenalog and 2 mL of Marcaine injected into subacromial bursa. Fluid seen entering the bursa.   Completed without difficulty   Pain immediately resolved suggesting accurate placement of the medication.   Advised to call if fevers/chills, erythema, induration, drainage, or persistent bleeding.   Images permanently stored and available for review in the ultrasound unit.  Impression: Technically successful ultrasound guided injection.      EXAM: RIGHT SHOULDER - 2+ VIEW   COMPARISON:  None.   FINDINGS: Mild joint space loss of the acromioclavicular joint. No fracture or dislocation. Soft tissues are unremarkable.   IMPRESSION: Mild degenerative changes of the right shoulder.     Electronically Signed   By: Miachel Roux M.D.   On: 03/05/2021 06:24 I, [name], personally (independently) visualized and performed the interpretation of the images attached in this note.    Assessment and Plan: 51 y.o. female with right shoulder pain due to subacromial bursitis.  Plan to treat with continued home exercise.  Overall also we will add subacromial injection.  Recheck in about 6 weeks.  Return sooner if needed.   PDMP not reviewed this encounter. Orders Placed This Encounter  Procedures   [place] LIMITED JOINT SPACE STRUCTURES UP RIGHT(NO LINKED CHARGES)    Order Specific Question:   Reason for Exam (SYMPTOM  OR DIAGNOSIS REQUIRED)    Answer:   R shoulder pain    Order Specific Question:   Preferred imaging location?  Answer:   [place]   No orders of the defined types were placed in this encounter.    Discussed warning signs or symptoms. Please see discharge instructions. Patient expresses understanding.   The above documentation has been reviewed and is accurate and complete [name], M.D.

## 2021-04-20 NOTE — Patient Instructions (Addendum)
Good to see you today.  You had a R shoulder injection.  Call or go to the ER if you develop a large red swollen joint with extreme pain or oozing puss.   Follow-up: 6 weeks

## 2021-04-21 ENCOUNTER — Ambulatory Visit: Payer: BC Managed Care – PPO | Admitting: Family Medicine

## 2021-05-02 ENCOUNTER — Other Ambulatory Visit: Payer: Self-pay | Admitting: Family Medicine

## 2021-05-09 ENCOUNTER — Other Ambulatory Visit: Payer: Self-pay | Admitting: Podiatry

## 2021-05-11 ENCOUNTER — Encounter: Payer: Self-pay | Admitting: Obstetrics & Gynecology

## 2021-05-11 ENCOUNTER — Encounter: Payer: Self-pay | Admitting: Family Medicine

## 2021-05-11 ENCOUNTER — Other Ambulatory Visit: Payer: Self-pay

## 2021-05-11 ENCOUNTER — Ambulatory Visit (INDEPENDENT_AMBULATORY_CARE_PROVIDER_SITE_OTHER): Payer: BC Managed Care – PPO | Admitting: Family Medicine

## 2021-05-11 ENCOUNTER — Ambulatory Visit (INDEPENDENT_AMBULATORY_CARE_PROVIDER_SITE_OTHER): Payer: BC Managed Care – PPO | Admitting: Obstetrics & Gynecology

## 2021-05-11 ENCOUNTER — Other Ambulatory Visit (HOSPITAL_COMMUNITY)
Admission: RE | Admit: 2021-05-11 | Discharge: 2021-05-11 | Disposition: A | Discharge status: Home / Self Care | Payer: BC Managed Care – PPO | Source: Ambulatory Visit | Attending: Obstetrics & Gynecology | Admitting: Obstetrics & Gynecology

## 2021-05-11 VITALS — BP 110/80 | HR 74 | Resp 16 | Ht 67.75 in | Wt 235.0 lb

## 2021-05-11 VITALS — BP 120/88 | HR 77 | Temp 97.4°F | Ht 69.0 in | Wt 235.2 lb

## 2021-05-11 DIAGNOSIS — F3342 Major depressive disorder, recurrent, in full remission: Secondary | ICD-10-CM | POA: Insufficient documentation

## 2021-05-11 DIAGNOSIS — Z780 Asymptomatic menopausal state: Secondary | ICD-10-CM

## 2021-05-11 DIAGNOSIS — E538 Deficiency of other specified B group vitamins: Secondary | ICD-10-CM

## 2021-05-11 DIAGNOSIS — G2581 Restless legs syndrome: Secondary | ICD-10-CM

## 2021-05-11 DIAGNOSIS — R87610 Atypical squamous cells of undetermined significance on cytologic smear of cervix (ASC-US): Secondary | ICD-10-CM | POA: Diagnosis not present

## 2021-05-11 DIAGNOSIS — I10 Essential (primary) hypertension: Secondary | ICD-10-CM | POA: Diagnosis not present

## 2021-05-11 DIAGNOSIS — Z01419 Encounter for gynecological examination (general) (routine) without abnormal findings: Secondary | ICD-10-CM | POA: Diagnosis not present

## 2021-05-11 DIAGNOSIS — G43109 Migraine with aura, not intractable, without status migrainosus: Secondary | ICD-10-CM

## 2021-05-11 DIAGNOSIS — Z0000 Encounter for general adult medical examination without abnormal findings: Secondary | ICD-10-CM | POA: Diagnosis not present

## 2021-05-11 DIAGNOSIS — R739 Hyperglycemia, unspecified: Secondary | ICD-10-CM

## 2021-05-11 DIAGNOSIS — Z87891 Personal history of nicotine dependence: Secondary | ICD-10-CM

## 2021-05-11 DIAGNOSIS — Z6836 Body mass index (BMI) 36.0-36.9, adult: Secondary | ICD-10-CM

## 2021-05-11 LAB — CBC WITH DIFFERENTIAL/PLATELET
Basophils Absolute: 0 10*3/uL (ref 0.0–0.1)
Basophils Relative: 0.5 % (ref 0.0–3.0)
Eosinophils Absolute: 0.2 10*3/uL (ref 0.0–0.7)
Eosinophils Relative: 2.4 % (ref 0.0–5.0)
HCT: 45.4 % (ref 36.0–46.0)
Hemoglobin: 15.7 g/dL — ABNORMAL HIGH (ref 12.0–15.0)
Lymphocytes Relative: 38 % (ref 12.0–46.0)
Lymphs Abs: 3.6 10*3/uL (ref 0.7–4.0)
MCHC: 34.6 g/dL (ref 30.0–36.0)
MCV: 89.8 fl (ref 78.0–100.0)
Monocytes Absolute: 0.4 10*3/uL (ref 0.1–1.0)
Monocytes Relative: 4.4 % (ref 3.0–12.0)
Neutro Abs: 5.2 10*3/uL (ref 1.4–7.7)
Neutrophils Relative %: 54.7 % (ref 43.0–77.0)
Platelets: 218 10*3/uL (ref 150.0–400.0)
RBC: 5.05 Mil/uL (ref 3.87–5.11)
RDW: 14.3 % (ref 11.5–15.5)
WBC: 9.6 10*3/uL (ref 4.0–10.5)

## 2021-05-11 LAB — LIPID PANEL
Cholesterol: 153 mg/dL (ref 0–200)
HDL: 43.9 mg/dL (ref >39.00)
LDL Cholesterol: 87 mg/dL (ref 0–99)
NonHDL: 108.73
Total CHOL/HDL Ratio: 3
Triglycerides: 108 mg/dL (ref 0.0–149.0)
VLDL: 21.6 mg/dL (ref 0.0–40.0)

## 2021-05-11 LAB — POC URINALSYSI DIPSTICK (AUTOMATED)
Bilirubin, UA: NEGATIVE
Blood, UA: NEGATIVE
Glucose, UA: POSITIVE — AB
Ketones, UA: NEGATIVE
Nitrite, UA: NEGATIVE
Protein, UA: POSITIVE — AB
Spec Grav, UA: 1.025 (ref 1.010–1.025)
Urobilinogen, UA: NEGATIVE E.U./dL — AB
pH, UA: 6 (ref 5.0–8.0)

## 2021-05-11 LAB — COMPREHENSIVE METABOLIC PANEL
ALT: 53 U/L — ABNORMAL HIGH (ref 0–35)
AST: 45 U/L — ABNORMAL HIGH (ref 0–37)
Albumin: 4.4 g/dL (ref 3.5–5.2)
Alkaline Phosphatase: 92 U/L (ref 39–117)
BUN: 17 mg/dL (ref 6–23)
CO2: 28 mEq/L (ref 19–32)
Calcium: 10.2 mg/dL (ref 8.4–10.5)
Chloride: 99 mEq/L (ref 96–112)
Creatinine, Ser: 0.96 mg/dL (ref 0.40–1.20)
GFR: 67.93 mL/min (ref >60.00)
Glucose, Bld: 272 mg/dL — ABNORMAL HIGH (ref 70–99)
Potassium: 4.8 mEq/L (ref 3.5–5.1)
Sodium: 136 mEq/L (ref 135–145)
Total Bilirubin: 0.9 mg/dL (ref 0.2–1.2)
Total Protein: 7.1 g/dL (ref 6.0–8.3)

## 2021-05-11 MED ORDER — FLUTICASONE PROPIONATE 50 MCG/ACT NA SUSP: NASAL | 3 refills | Status: DC

## 2021-05-11 MED ORDER — CYCLOBENZAPRINE HCL 5 MG PO TABS: 5.0000 mg | ORAL_TABLET | Freq: Three times a day (TID) | ORAL | 1 refills | Status: DC | PRN

## 2021-05-11 MED ORDER — AZELASTINE HCL 0.1 % NA SOLN: 2.0000 | Freq: Two times a day (BID) | NASAL | 3 refills | Status: DC

## 2021-05-11 NOTE — Patient Instructions (Addendum)
Please stop by lab before you go ?If you have mychart- we will send your results within 3 business days of [place] receiving them.  ?If you do not have mychart- we will call you about results within 5 business days of [place] receiving them.  ?*please also note that you will see labs on mychart as soon as they post. I will later go in and write notes on them- will say "notes from Dr. Yong Channel"  ? ?Recommended follow up: Return in about 6 months (around 11/11/2021) for followup or sooner if needed.Schedule b4 you leave. ?

## 2021-05-11 NOTE — Progress Notes (Signed)
? ? ?Poetry Cerro February 28, 1970 [id number] ? ? ?History:    51 y.o. G2P2L2 Married. S/P TL.  3 grand-children. ? ?RP:  Established patient presenting for annual gyn exam  ? ?HPI: Postmenopause x 3 years, on no HRT, still having hot flushes and night sweats. No PMB.  No pelvic pain.  No pain with IC.  H/O ASCUS/HPV HR Neg in 2020.  Pap reflex today.  Mammo 07/2020 Neg.  Colono 08/2016, will repeat this year.  BMI 36.  Started a low calorie/carb diet and fitness activities. ? ? ?Past medical history,surgical history, family history and social history were all reviewed and documented in the EPIC chart. ? ?Gynecologic History ?Patient's last menstrual period was 06/05/2018. ? ?Obstetric History ?OB History  ?Gravida Para Term Preterm AB Living  ?'2 2 2     2  '$ ?SAB IAB Ectopic Multiple Live Births  ?        2  ?  ?# Outcome Date GA Lbr Len/2nd Weight Sex Delivery Anes PTL Lv  ?2 Term      Vag-Spont   LIV  ?1 Term      Vag-Spont   LIV  ? ? ? ?ROS: A ROS was performed and pertinent positives and negatives are included in the history. ? GENERAL: No fevers or chills. HEENT: No change in vision, no earache, sore throat or sinus congestion. NECK: No pain or stiffness. CARDIOVASCULAR: No chest pain or pressure. No palpitations. PULMONARY: No shortness of breath, cough or wheeze. GASTROINTESTINAL: No abdominal pain, nausea, vomiting or diarrhea, melena or bright red blood per rectum. GENITOURINARY: No urinary frequency, urgency, hesitancy or dysuria. MUSCULOSKELETAL: No joint or muscle pain, no back pain, no recent trauma. DERMATOLOGIC: No rash, no itching, no lesions. ENDOCRINE: No polyuria, polydipsia, no heat or cold intolerance. No recent change in weight. HEMATOLOGICAL: No anemia or easy bruising or bleeding. NEUROLOGIC: No headache, seizures, numbness, tingling or weakness. PSYCHIATRIC: No depression, no loss of interest in normal activity or change in sleep pattern.  ?  ? ?Exam: ? ? ?BP 110/80   Pulse 74   Resp 16    Ht 5' 7.75" (1.721 m)   Wt 235 lb (106.6 kg)   LMP 06/05/2018   BMI 36.00 kg/m?  ? ?Body mass index is 36 kg/m?. ? ?General appearance : Well developed well nourished female. No acute distress ?HEENT: Eyes: no retinal hemorrhage or exudates,  Neck supple, trachea midline, no carotid bruits, no thyroidmegaly ?Lungs: Clear to auscultation, no rhonchi or wheezes, or rib retractions  ?Heart: Regular rate and rhythm, no murmurs or gallops ?Breast:Examined in sitting and supine position were symmetrical in appearance, no palpable masses or tenderness,  no skin retraction, no nipple inversion, no nipple discharge, no skin discoloration, no axillary or supraclavicular lymphadenopathy ?Abdomen: no palpable masses or tenderness, no rebound or guarding ?Extremities: no edema or skin discoloration or tenderness ? ?Pelvic: Vulva: Normal ?            Vagina: No gross lesions or discharge ? Cervix: No gross lesions or discharge.  Pap Reflex done. ? Uterus  AV, normal size, shape and consistency, non-tender and mobile ? Adnexa  Without masses or tenderness ? Anus: Normal ? ? ?Assessment/Plan:  51 y.o. female for annual exam  ? ?1. Encounter for routine gynecological examination with Papanicolaou smear of cervix ?Postmenopause x 3 years, on no HRT, still having hot flushes and night sweats. No PMB.  No pelvic pain.  No pain with IC.  H/O  ASCUS/HPV HR Neg in 2020.  Pap reflex today.  Mammo 07/2020 Neg.  Colono 08/2016, will repeat this year.  BMI 36.  Started a low calorie/carb diet and fitness activities. ?- Cytology - PAP( [place]) ? ?2. ASCUS of cervix with negative high risk HPV ?Pap reflex today. ? ?3. Postmenopause ?Postmenopause x 3 years, on no HRT, still having hot flushes and night sweats. No PMB.  No pelvic pain.  No pain with IC.  Will try Ashwagandha at this time.  If not sufficient to control her menopausal Sxs, will come back for HRT counseling. ? ?4. Class 2 severe obesity due to excess calories with serious  comorbidity and body mass index (BMI) of 36.0 to 36.9 in adult [facility])  ?Low calorie/carb diet.  Increase fitness activities. ? ?Princess Bruins MD, 10:42 AM 05/11/2021 ? ?  ?

## 2021-05-11 NOTE — Progress Notes (Signed)
?Phone [phone] ?  ?Subjective:  ?Patient presents today for their annual physical. Chief complaint-noted.  ? ?See problem oriented charting- ?ROS- full  review of systems was completed and negative ?except for: allergies and headaches ? ?The following were reviewed and entered/updated in epic: ?Past Medical History:  ?Diagnosis Date  ? Allergy   ? Arthritis   ? Chronic headaches   ? Depression   ? GERD (gastroesophageal reflux disease)   ? Hypertension   ? Low back pain   ? UTI (urinary tract infection)   ? ?Patient Active Problem List  ? Diagnosis Date Noted  ? Former smoker 07/19/2018  ?  Priority: High  ? Constipation 07/19/2018  ?  Priority: Medium   ? History of adenomatous polyp of colon 09/23/2016  ?  Priority: Medium   ? Migraines 05/06/2016  ?  Priority: Medium   ? HTN (hypertension) 11/21/2011  ?  Priority: Medium   ? Depression 09/06/2006  ?  Priority: Medium   ? Restless legs 07/19/2018  ?  Priority: Low  ? Ingrown toenail 12/20/2016  ?  Priority: Low  ? Family history of cancer of GI tract 12/06/2012  ?  Priority: Low  ? MENORRHAGIA 09/23/2009  ?  Priority: Low  ? PLANTAR FASCIITIS 09/20/2007  ?  Priority: Low  ? Osteoarthritis 09/19/2006  ?  Priority: Low  ? Allergic rhinitis 09/06/2006  ?  Priority: Low  ? GERD 09/06/2006  ?  Priority: Low  ? Recurrent major depressive disorder, in full remission ([place]) 05/11/2021  ? Bilateral sciatica 12/08/2020  ? Mass of thigh, left 08/26/2020  ? Pain in right hand 09/27/2018  ? B12 deficiency 07/19/2018  ? ?Past Surgical History:  ?Procedure Laterality Date  ? TUBAL LIGATION    ? ? ?Family History  ?Problem Relation Age of Onset  ? Diabetes Father   ?     mother  ? Hyperlipidemia Father   ?     mother  ? Hypertension Father   ?     mother  ? Colon cancer Father   ?     83s, and grandmother  ? Lung cancer Father   ?     in 38s  ? Cancer - Other Sister   ?     duodenal  ? Stomach cancer Sister   ? Diabetes Mother   ? Hypertension Mother   ? Hyperlipidemia  Mother   ? Kidney disease Mother   ?     has fistula- could need dialysis at any time  ? Colon cancer Paternal Grandmother   ? Colon cancer Maternal Aunt   ? Esophageal cancer Neg Hx   ? Liver cancer Neg Hx   ? Pancreatic cancer Neg Hx   ? Rectal cancer Neg Hx   ? ? ?Medications- reviewed and updated ?Current Outpatient Medications  ?Medication Sig Dispense Refill  ? amLODipine (NORVASC) 2.5 MG tablet TAKE 1 TABLET BY MOUTH EVERY DAY 90 tablet 3  ? clotrimazole-betamethasone (LOTRISONE) cream Apply 1 application topically 2 (two) times daily. 45 g 1  ? diclofenac Sodium (VOLTAREN) 1 % GEL Apply 4 g topically 4 (four) times daily. To affected joint. 100 g 11  ? gabapentin (NEURONTIN) 300 MG capsule TAKE 2 CAPSULES BY MOUTH AT BEDTIME 180 capsule 1  ? hydrochlorothiazide (HYDRODIURIL) 25 MG tablet TAKE 1 TABLET (25 MG TOTAL) BY MOUTH DAILY. 90 tablet 3  ? meloxicam (MOBIC) 7.5 MG tablet TAKE 1 TABLET (7.5 MG TOTAL) BY MOUTH DAILY AS NEEDED. FOR  PAIN 90 tablet 1  ? pantoprazole (PROTONIX) 40 MG tablet Take 1 tablet (40 mg total) by mouth daily. 90 tablet 3  ? ?No current facility-administered medications for this visit.  ? ? ?Allergies-reviewed and updated ?Allergies  ?Allergen Reactions  ? Amoxicillin Swelling  ?  Lip swelling  ? ? ?Social History  ? ?Social History Narrative  ? Married May 13th 2017. 2 children- son 103 (married) and daughter 42. Granddaughter august 2017.   ?   ? Works in Art therapist at [employer]  ?   ? Hobbies: time with family  ? ?Objective  ?Objective:  ?BP 120/88   Pulse 77   Temp (!) 97.4 ?F (36.3 ?C)   Ht '5\' 9"'$  (1.753 m)   Wt 235 lb 3.2 oz (106.7 kg)   LMP 06/05/2018   SpO2 97%   BMI 34.73 kg/m?  ?Gen: NAD, resting comfortably ?HEENT: Mucous membranes are moist. Oropharynx normal ?Neck: no thyromegaly ?CV: RRR no murmurs rubs or gallops ?Lungs: CTAB no crackles, wheeze, rhonchi ?Abdomen: soft/nontender/nondistended/normal bowel sounds. No rebound or guarding.  ?Ext: no  edema ?Skin: warm, dry ?Neuro: grossly normal, moves all extremities, PERRLA ?  ?Assessment and Plan  ? ?51 y.o. female presenting for annual physical.  ?Health Maintenance counseling: ?1. Anticipatory guidance: Patient counseled regarding regular dental exams -q6 months, eye exams - yearly,  avoiding smoking and second hand smoke, limiting alcohol to 1 beverage per day- once a week or less, no illicit drugs .   ?2. Risk factor reduction:  Advised patient of need for regular exercise and diet rich and fruits and vegetables to reduce risk of heart attack and stroke.  ?Exercise- having a hard time finding time- goal 150 minutes a week.  ?Diet/weight management-weight down 8 lbs from last physical- down 11 lbs since January! Doing a piece of fruit in pm before she gets home so doesn't go home hungry and helps curb appetite and has cut out soda ?Wt Readings from Last 3 Encounters:  ?05/11/21 235 lb 3.2 oz (106.7 kg)  ?04/20/21 245 lb 9.6 oz (111.4 kg)  ?03/04/21 246 lb 12.8 oz (111.9 kg)  ?3. Immunizations/screenings/ancillary studies-had COVID in January - mild lingering cough and mildly winded (will let us know if worsens or fails to improve- likely mild long covid), no other symptoms ?Immunization History  ?Administered Date(s) Administered  ? Influenza Split 11/19/2007, 12/03/2008, 11/21/2011, 11/22/2018  ? Influenza Whole 11/19/2007, 12/03/2008, 11/22/2018  ? Influenza,inj,Quad PF,6+ Mos 11/21/2012, 12/24/2015, 11/03/2020  ? Influenza,inj,quad, With Preservative 12/18/2017  ? Influenza-Unspecified 12/17/2014, 12/04/2019  ? PFIZER Comirnaty(Gray Top)Covid-19 Tri-Sucrose Vaccine 05/30/2019, 06/26/2019, 02/19/2020  ? PFIZER(Purple Top)SARS-COV-2 Vaccination 05/30/2019, 06/26/2019, 02/19/2020  ? Td 02/21/2001  ? Tdap 11/21/2011  ? Zoster Recombinat (Shingrix) 03/03/2020, 09/30/2020  ?4. Cervical cancer screening- follows with Dr. Gentry Fitz office. 09-20-2018 ASCUS HPV HR neg-was advised next cotesting date 2023 at last  physical will be due this year- has later today ?5. Breast cancer screening-  breast exam with GYN and mammogram 07/29/2020 and advised annually ?6. Colon cancer screening - 09/16/2016 and history of adenomatous colon polyps and will be due later this year-should receive a recall letter from GI ?7. Skin cancer screening-lower risk due to melanin content. advised regular sunscreen use. Denies worrisome, changing, or new skin lesions.  ?8. Birth control/STD check- tubal ligation/monogamous ?9. Osteoporosis screening at 65-we will plan on this  ?10. Smoking associated screening -former smoker-Black and milds perhaps 1-1-1/2 a day in the past-below threshold for lung cancer screening  but will get UA ? ?Status of chronic or acute concerns  ? ?#social update- caring for mom and spending a lot of time with grandkids as well. Planning to do some travel around her birthday- will do a trip to [place]- girls trip.  ? ?#Depression in full remission ?S:medication: none ?prior  Paxil 10 mg in past ?- trial counseling 2022- did a few sessions ?Depression screen [facility] 2/9 05/11/2021 11/03/2020 03/03/2020  ?Decreased Interest 0 0 0  ?Down, Depressed, Hopeless 0 0 2  ?PHQ - 2 Score 0 0 2  ?Altered sleeping '2 1 3  '$ ?Tired, decreased energy 0 0 2  ?Change in appetite 0 0 1  ?Feeling bad or failure about yourself  0 0 0  ?Trouble concentrating 0 0 1  ?Moving slowly or fidgety/restless 0 0 0  ?Suicidal thoughts 0 0 0  ?PHQ-9 Score '2 1 9  '$ ?Difficult doing work/chores Not difficult at all Not difficult at all -  ?A/P: Doing much better recently-full remission without medicine-continue to monitor ? ? #Hypertension ?S: Compliant with amlodipine 2.5 mg and hydrochlorothiazide 25 mg  ?BP Readings from Last 3 Encounters:  ?05/11/21 120/88  ?04/20/21 108/72  ?03/04/21 108/74  ? A/P:  Controlled. Continue current medications.  Keep an eye on blood pressure as losing weight- may need to reduce meds ? ? # Migraines with aura ?S: primarily uses [facility]  general migraine tablets. fiorcet in past. Once every 2 weeks ?A/P: Reasonably stable-continue current medication ?-worse with allergies- did refill astelin and flonase today which have worked well in past ? ?#Low b

## 2021-05-12 ENCOUNTER — Telehealth: Payer: Self-pay | Admitting: Family Medicine

## 2021-05-12 ENCOUNTER — Other Ambulatory Visit (INDEPENDENT_AMBULATORY_CARE_PROVIDER_SITE_OTHER): Payer: BC Managed Care – PPO

## 2021-05-12 DIAGNOSIS — R739 Hyperglycemia, unspecified: Secondary | ICD-10-CM

## 2021-05-12 LAB — HEMOGLOBIN A1C: Hgb A1c MFr Bld: 9.2 % — ABNORMAL HIGH (ref 4.6–6.5)

## 2021-05-12 NOTE — Telephone Encounter (Signed)
Paperwork placed up front for pick up, called and lm on pt vm making her aware. ?

## 2021-05-12 NOTE — Telephone Encounter (Signed)
Pt is coming in the afternoon to do labs at 4pm. She is wanting to know also if she can pick up the paperwork she gave to Dr Yong Channel yesterday.  ?

## 2021-05-13 ENCOUNTER — Encounter: Payer: Self-pay | Admitting: Family Medicine

## 2021-05-13 DIAGNOSIS — E1165 Type 2 diabetes mellitus with hyperglycemia: Secondary | ICD-10-CM | POA: Insufficient documentation

## 2021-05-13 DIAGNOSIS — E119 Type 2 diabetes mellitus without complications: Secondary | ICD-10-CM | POA: Insufficient documentation

## 2021-05-17 LAB — CYTOLOGY - PAP
Comment: NEGATIVE
Diagnosis: UNDETERMINED — AB
High risk HPV: NEGATIVE

## 2021-05-17 NOTE — Progress Notes (Signed)
? ?Phone [phone] ?In person visit ?  ?Subjective:  ? ?Tonya Suarez is a 51 y.o. year old very pleasant female patient who presents for/with See problem oriented charting ?Chief Complaint  ?Patient presents with  ? Follow-up  ? Diabetes  ?  Pt is here to f/u on a1c and new diabetes diagnosis.   ? ? ?This visit occurred during the SARS-CoV-2 public health emergency.  Safety protocols were in place, including screening questions prior to the visit, additional usage of staff PPE, and extensive cleaning of exam room while observing appropriate contact time as indicated for disinfecting solutions.  ? ?Past Medical History-  ?Patient Active Problem List  ? Diagnosis Date Noted  ? Poorly controlled diabetes mellitus ([place]) 05/13/2021  ?  Priority: High  ? Former smoker 07/19/2018  ?  Priority: High  ? Hyperlipidemia associated with type 2 diabetes mellitus ([place]) 05/25/2021  ?  Priority: Medium   ? Constipation 07/19/2018  ?  Priority: Medium   ? History of adenomatous polyp of colon 09/23/2016  ?  Priority: Medium   ? Migraines 05/06/2016  ?  Priority: Medium   ? HTN (hypertension) 11/21/2011  ?  Priority: Medium   ? Depression 09/06/2006  ?  Priority: Medium   ? Restless legs 07/19/2018  ?  Priority: Low  ? Ingrown toenail 12/20/2016  ?  Priority: Low  ? Family history of cancer of GI tract 12/06/2012  ?  Priority: Low  ? MENORRHAGIA 09/23/2009  ?  Priority: Low  ? PLANTAR FASCIITIS 09/20/2007  ?  Priority: Low  ? Osteoarthritis 09/19/2006  ?  Priority: Low  ? Allergic rhinitis 09/06/2006  ?  Priority: Low  ? GERD 09/06/2006  ?  Priority: Low  ? Recurrent major depressive disorder, in full remission ([place]) 05/11/2021  ? Bilateral sciatica 12/08/2020  ? Mass of thigh, left 08/26/2020  ? Pain in right hand 09/27/2018  ? B12 deficiency 07/19/2018  ? ? ?Medications- reviewed and updated ?Current Outpatient Medications  ?Medication Sig Dispense Refill  ? amLODipine (NORVASC) 2.5 MG tablet TAKE 1 TABLET BY MOUTH  EVERY DAY 90 tablet 3  ? azelastine (ASTELIN) 0.1 % nasal spray Place 2 sprays into both nostrils 2 (two) times daily. 90 mL 3  ? blood glucose meter kit and supplies KIT Dispense based on patient and insurance preference. Use up to four times daily as directed. 1 each 0  ? clotrimazole-betamethasone (LOTRISONE) cream Apply 1 application topically 2 (two) times daily. 45 g 1  ? cyclobenzaprine (FLEXERIL) 5 MG tablet Take 1 tablet (5 mg total) by mouth 3 (three) times daily as needed for muscle spasms (dont drive for 8 hours after taking). 30 tablet 1  ? diclofenac Sodium (VOLTAREN) 1 % GEL Apply 4 g topically 4 (four) times daily. To affected joint. 100 g 11  ? fluticasone (FLONASE) 50 MCG/ACT nasal spray SPRAY 2 SPRAYS INTO EACH NOSTRIL EVERY DAY 48 mL 3  ? gabapentin (NEURONTIN) 300 MG capsule TAKE 2 CAPSULES BY MOUTH AT BEDTIME 180 capsule 1  ? hydrochlorothiazide (HYDRODIURIL) 25 MG tablet TAKE 1 TABLET (25 MG TOTAL) BY MOUTH DAILY. 90 tablet 3  ? meloxicam (MOBIC) 7.5 MG tablet TAKE 1 TABLET (7.5 MG TOTAL) BY MOUTH DAILY AS NEEDED. FOR PAIN 90 tablet 1  ? pantoprazole (PROTONIX) 40 MG tablet Take 1 tablet (40 mg total) by mouth daily. 90 tablet 3  ? Semaglutide, 1 MG/DOSE, (OZEMPIC, 1 MG/DOSE,) 2 MG/1.5ML SOPN Inject 1 mg into the skin once  a week. 3 mL 5  ? Semaglutide,0.25 or 0.5MG /DOS, (OZEMPIC, 0.25 OR 0.5 MG/DOSE,) 2 MG/1.5ML SOPN Inject 0.25 mg as directed once a week for 28 days, THEN 0.5 mg once a week for 28 days. 2.25 mL 0  ? terbinafine (LAMISIL) 250 MG tablet TAKE 1 TABLET BY MOUTH EVERY DAY 30 tablet 0  ? ?No current facility-administered medications for this visit.  ? ?  ?Objective:  ?BP 110/70   Pulse 67   Temp 97.9 ?F (36.6 ?C)   Ht 5' 7.75" (1.721 m)   Wt 239 lb 6.4 oz (108.6 kg)   LMP 06/05/2018   SpO2 97%   BMI 36.67 kg/m?  ?Gen: NAD, resting comfortably ? ?Diabetic Foot Exam - Simple   ?Simple Foot Form ?Diabetic Foot exam was performed with the following findings: Yes 05/25/2021  4:27  PM  ?Visual Inspection ?No deformities, no ulcerations, no other skin breakdown bilaterally: Yes ?Sensation Testing ?Intact to touch and monofilament testing bilaterally: Yes ?Pulse Check ?Posterior Tibialis and Dorsalis pulse intact bilaterally: Yes ?Comments ?Onychomycosis noted- seeing podiatry ?  ? ?  ? ?Assessment and Plan  ? ? ?# Diabetes-new diagnosis 05/12/2021 ?S: Medication: None ?CBGs- 225 yesterday evening after her meal - used moms glucometer ?Exercise and diet- not exercising, had already cut out soda and was doing fruit as snack to try to help- had weight loss- some could be diabetes related ?-history of UTIs in past ?Lab Results  ?Component Value Date  ? HGBA1C 9.2 (H) 05/12/2021  ? HGBA1C 4.9 07/19/2018  ? A/P: Diabetes poorly controlled. Multiple family members have failed metformin alone or not tolerated it- several have had success with ozempic. We will trial ozempic and titrate up to 1 mg and then see each other in 3 months ? ?#mild lft elevations could be fatty liver plus on terbinafine- some risk with ozempic- recheck next labs- she was on this since January terbinafine so recent labs she was on this ?  ?#hyperlipidemia ?S: Medication:none  ?Lab Results  ?Component Value Date  ? CHOL 153 05/11/2021  ? HDL 43.90 05/11/2021  ? [place] 87 05/11/2021  ? TRIG 108.0 05/11/2021  ? CHOLHDL 3 05/11/2021  ? A/P: poor control- LDL goal under 70. We discussed potentially starting statin but want to start only 1 medication at a time we will hold off and discuss at next visit ? ?#Hypertension ?S: Compliant with amlodipine 2.5 mg and hydrochlorothiazide 25 mg ?BP Readings from Last 3 Encounters:  ?05/25/21 110/70  ?05/11/21 110/80  ?05/11/21 120/88  ?A/P: Controlled. Continue current medications.  ? ?Recommended follow up: Return in about 3 months (around 08/24/2021) for followup or sooner if needed.Schedule b4 you leave. ?Future Appointments  ?Date Time Provider [place]  ?06/22/2021  2:30 PM Princess Bruins, MD [facility] None  ?11/11/2021  3:40 PM Marin Olp, MD [facility]  ?05/16/2022  9:40 AM Marin Olp, MD [facility]  ?05/17/2022  4:00 PM Princess Bruins, MD [facility] None  ? ? ?Lab/Order associations: ?  ICD-10-CM   ?1. Hypertension, unspecified type  I10   ?  ?2. Recurrent major depressive disorder, in full remission ([place])  F33.42   ?  ?3. Primary osteoarthritis involving multiple joints  M15.9   ?  ?4. Migraine with aura and without status migrainosus, not intractable  G43.109   ?  ?5. Poorly controlled diabetes mellitus ([facility])  E11.65 Microalbumin / creatinine urine ratio  ?  ?6. Hyperlipidemia associated with type 2 diabetes mellitus ([place])  E11.69   ? E78.5   ?  ? ? ?Meds ordered this encounter  ?Medications  ? blood glucose meter kit and supplies KIT  ?  Sig: Dispense based on patient and insurance preference. Use up to four times daily as directed.  ?  Dispense:  1 each  ?  Refill:  0  ?  Order Specific Question:   Number of strips  ?  Answer:   100  ?  Order Specific Question:   Number of lancets  ?  Answer:   100  ? Semaglutide,0.25 or 0.5MG /DOS, (OZEMPIC, 0.25 OR 0.5 MG/DOSE,) 2 MG/1.5ML SOPN  ?  Sig: Inject 0.25 mg as directed once a week for 28 days, THEN 0.5 mg once a week for 28 days.  ?  Dispense:  2.25 mL  ?  Refill:  0  ?  Please provide pen needles  ? Semaglutide, 1 MG/DOSE, (OZEMPIC, 1 MG/DOSE,) 2 MG/1.5ML SOPN  ?  Sig: Inject 1 mg into the skin once a week.  ?  Dispense:  3 mL  ?  Refill:  5  ? ? ?Return precautions advised.  ?Garret Reddish, MD ? ? ?

## 2021-05-18 ENCOUNTER — Other Ambulatory Visit: Payer: Self-pay | Admitting: *Deleted

## 2021-05-18 DIAGNOSIS — R87610 Atypical squamous cells of undetermined significance on cytologic smear of cervix (ASC-US): Secondary | ICD-10-CM

## 2021-05-25 ENCOUNTER — Encounter: Payer: Self-pay | Admitting: Family Medicine

## 2021-05-25 ENCOUNTER — Ambulatory Visit: Payer: BC Managed Care – PPO | Admitting: Family Medicine

## 2021-05-25 VITALS — BP 110/70 | HR 67 | Temp 97.9°F | Ht 67.75 in | Wt 239.4 lb

## 2021-05-25 DIAGNOSIS — I10 Essential (primary) hypertension: Secondary | ICD-10-CM | POA: Diagnosis not present

## 2021-05-25 DIAGNOSIS — G43109 Migraine with aura, not intractable, without status migrainosus: Secondary | ICD-10-CM

## 2021-05-25 DIAGNOSIS — E785 Hyperlipidemia, unspecified: Secondary | ICD-10-CM

## 2021-05-25 DIAGNOSIS — E1169 Type 2 diabetes mellitus with other specified complication: Secondary | ICD-10-CM | POA: Insufficient documentation

## 2021-05-25 DIAGNOSIS — E1165 Type 2 diabetes mellitus with hyperglycemia: Secondary | ICD-10-CM | POA: Diagnosis not present

## 2021-05-25 DIAGNOSIS — F3342 Major depressive disorder, recurrent, in full remission: Secondary | ICD-10-CM

## 2021-05-25 DIAGNOSIS — M159 Polyosteoarthritis, unspecified: Secondary | ICD-10-CM

## 2021-05-25 MED ORDER — OZEMPIC (1 MG/DOSE) 2 MG/1.5ML ~~LOC~~ SOPN
1.0000 mg | PEN_INJECTOR | SUBCUTANEOUS | 5 refills | Status: DC
Start: 2021-05-25 — End: 2021-06-22

## 2021-05-25 MED ORDER — OZEMPIC (0.25 OR 0.5 MG/DOSE) 2 MG/1.5ML ~~LOC~~ SOPN: PEN_INJECTOR | SUBCUTANEOUS | 0 refills | Status: DC

## 2021-05-25 MED ORDER — BLOOD GLUCOSE MONITOR KIT: PACK | 0 refills | Status: DC

## 2021-05-25 NOTE — Patient Instructions (Addendum)
Next eye exam please inform them you have diabetes and have them send [place] a copy ? ?Diabetes poorly controlled. Multiple family members have failed metformin alone or not tolerated it- several have had success with ozempic. We will trial ozempic and titrate up to 1 mg and then see each other in 3 months ? - 0.25 mg for 4 weeks (1st rx) ?-0.5 mg for 5 weeks (1st rx) ?- 1 mg ongoing (2nd rx) ? ?Team please provide work note for today stating that she had an appointment this afternoon and may return to work tomorrow ? ?Recommended follow up: Return in about 3 months (around 08/24/2021) for followup or sooner if needed.Schedule b4 you leave.  ?

## 2021-05-26 LAB — MICROALBUMIN / CREATININE URINE RATIO
Creatinine,U: 190.2 mg/dL
Microalb Creat Ratio: 0.4 mg/g (ref 0.0–30.0)
Microalb, Ur: <0.7 mg/dL (ref 0.0–1.9)

## 2021-05-31 ENCOUNTER — Telehealth: Payer: Self-pay | Admitting: Family Medicine

## 2021-05-31 DIAGNOSIS — E1165 Type 2 diabetes mellitus with hyperglycemia: Secondary | ICD-10-CM

## 2021-05-31 NOTE — Telephone Encounter (Signed)
Pt is requesting an update on her medication. It was needing a PA. Please advise ?

## 2021-05-31 NOTE — Telephone Encounter (Signed)
PA submitted today, called and made pt aware we are awaiting determination.  ?

## 2021-06-01 ENCOUNTER — Ambulatory Visit: Payer: BC Managed Care – PPO | Admitting: Family Medicine

## 2021-06-02 NOTE — Telephone Encounter (Signed)
Pt states pharmacy informed her that the medication was denied. She has spoken with the insurance. She is wanting to speak with a cma about some possible changes that might help her get the medication. ?

## 2021-06-02 NOTE — Telephone Encounter (Signed)
Called and spoke with pt, see phone note. ?

## 2021-06-02 NOTE — Telephone Encounter (Signed)
Called and spoke with pt and insurance denied Ozempic due to not trying and failing metformin or Jardiance. Pt states the said she needs to have documentation of type 2 high risk kidney failure and documentation of high risk heart disease and trying and failing metformin  before approval. Pt states she does not wish to try metformin due to it causing kidney failure in her mom and her mom is now on dialysis. Pt wanted referral to endocrinology so referral has been placed. ?

## 2021-06-02 NOTE — Addendum Note (Signed)
Addended by: Clyde Lundborg A on: 06/02/2021 11:55 AM ? ? Modules accepted: Orders ? ?

## 2021-06-06 IMAGING — MR MR HIP*R* W/O CM
5 series · 40 of 40 positions shown · non-contrast
Comparison: X-ray 06/25/2020

CLINICAL DATA: Chronic right hip pain radiating down the right leg
for 6 months. No known injury.

EXAM:
MR OF THE RIGHT HIP WITHOUT CONTRAST
TECHNIQUE: Multiplanar, multisequence MR imaging was performed. No intravenous
contrast was administered.

[Series 2: T1 · coronal · 4.0mm · 1.48mm/px · 10 of 38 slices shown]
[im 1/38]
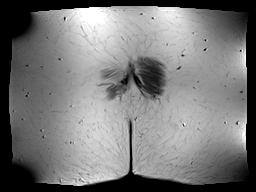
[im 5/38]
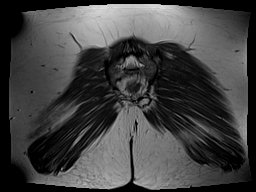
[im 9/38]
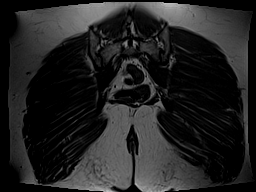
[im 13/38]
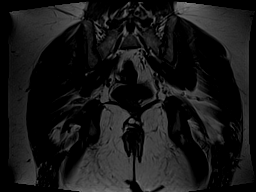
[im 17/38]
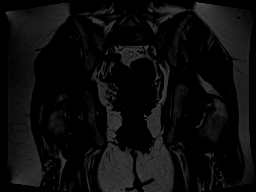
[im 21/38]
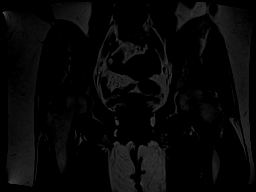
[im 25/38]
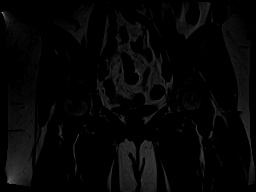
[im 29/38]
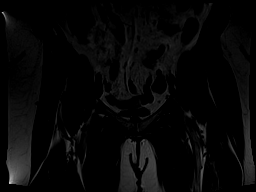
[im 33/38]
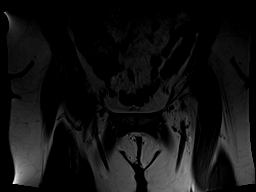
[im 38/38]
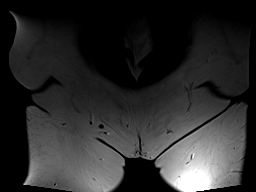

[Series 3: STIR · coronal · 4.0mm · 1.48mm/px · 10 of 38 slices shown (1 of 2)]
[im 1/38]
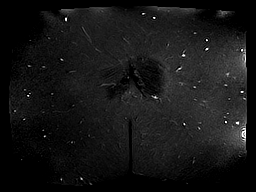
[im 5/38]
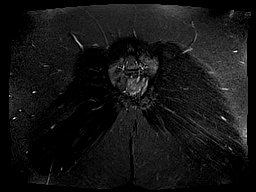
[im 9/38]
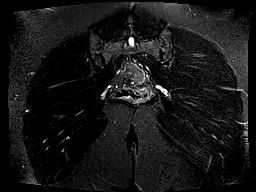
[im 13/38]
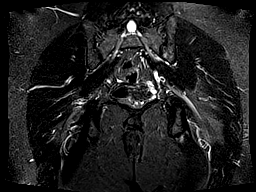
[im 17/38]
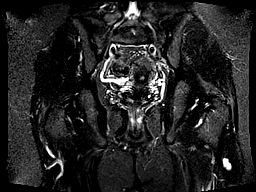
[im 21/38]
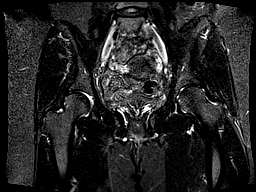
[im 25/38]
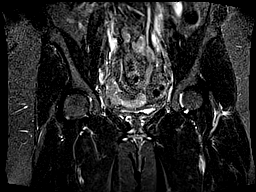
[im 29/38]
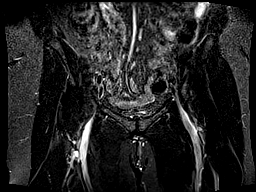
[im 33/38]
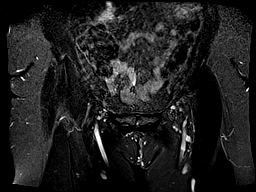
[im 38/38]
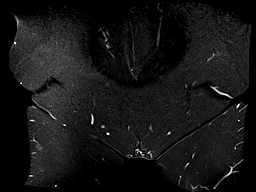

[Series 4: STIR · axial · 4.0mm · 1.48mm/px · z∈[-95,+50]mm · 8 of 30 slices shown (2 of 2)]
[im 1/30]
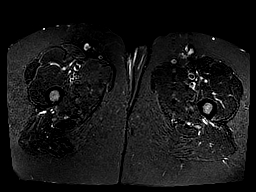
[im 5/30]
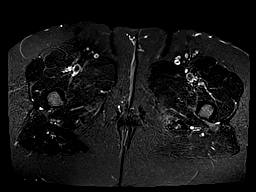
[im 9/30]
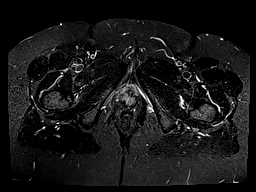
[im 13/30]
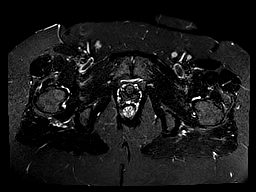
[im 17/30]
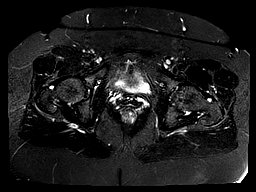
[im 21/30]
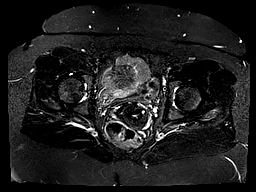
[im 25/30]
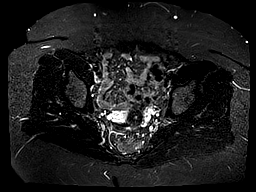
[im 30/30]
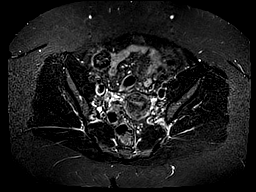

[Series 5: PD fat-sat · sagittal · 4.0mm · 0.35mm/px · 6 of 24 slices shown (1 of 2)]
[im 1/24]
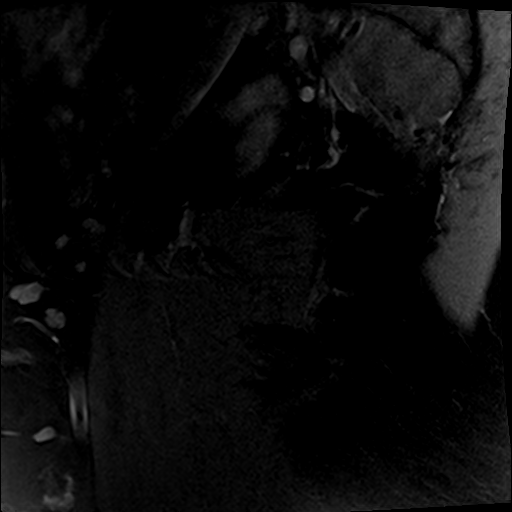
[im 5/24]
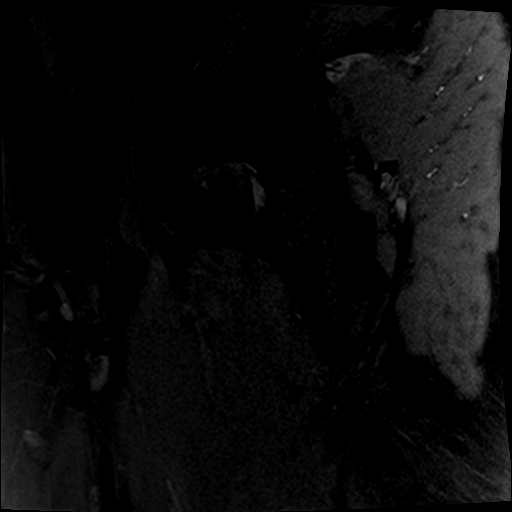
[im 10/24]
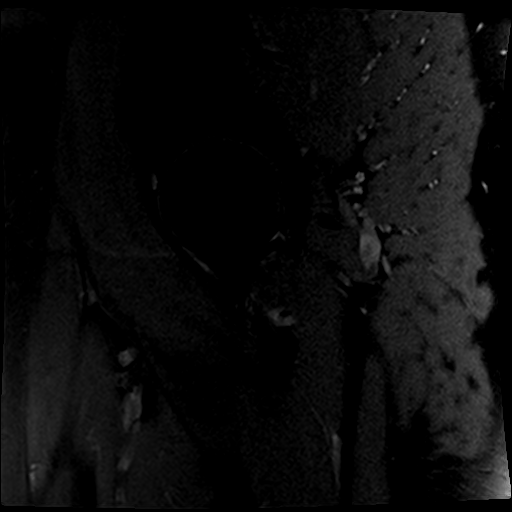
[im 14/24]
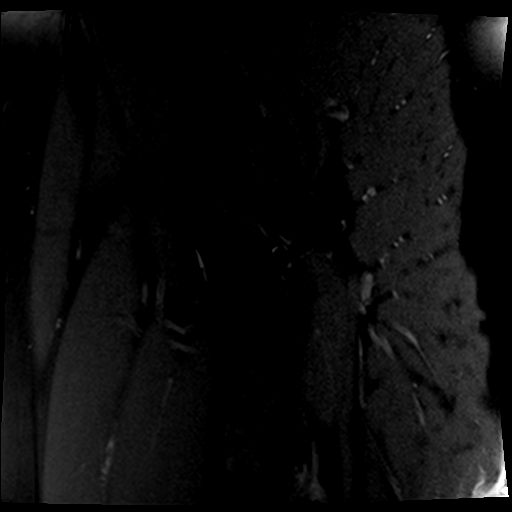
[im 19/24]
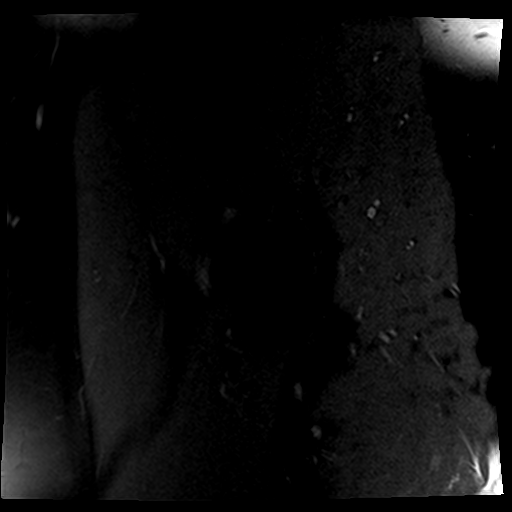
[im 24/24]
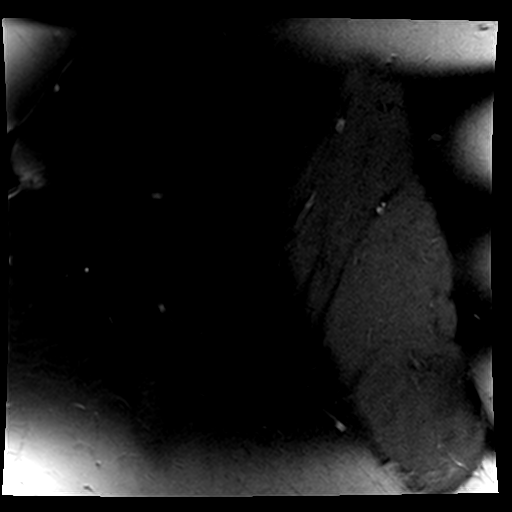

[Series 6: PD fat-sat · coronal · 4.5mm · 0.35mm/px · 6 of 23 slices shown (2 of 2)]
[im 1/23]
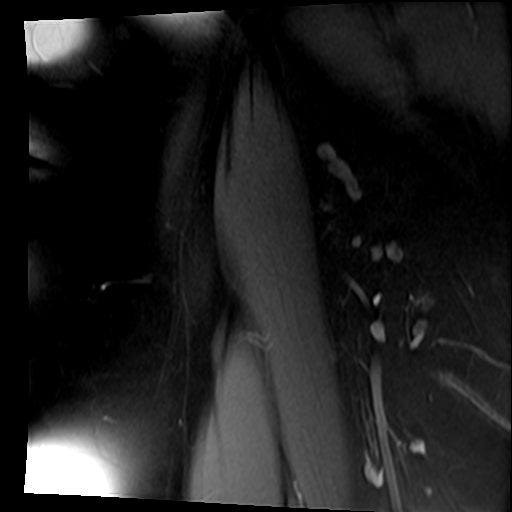
[im 5/23]
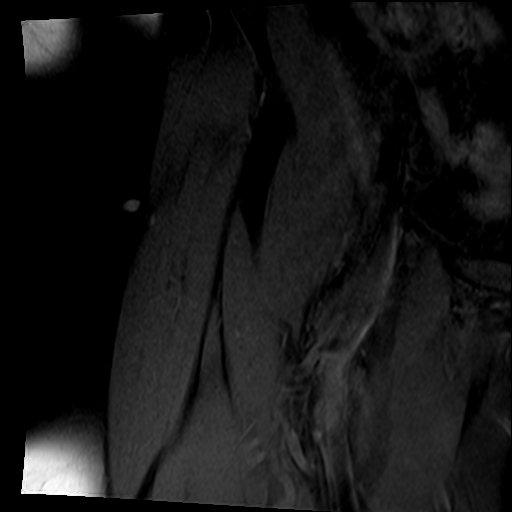
[im 9/23]
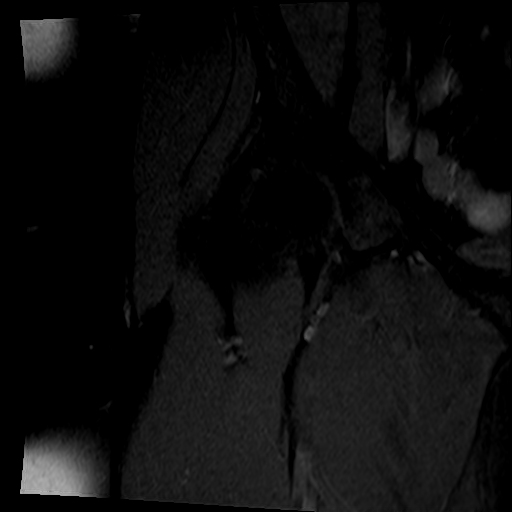
[im 14/23]
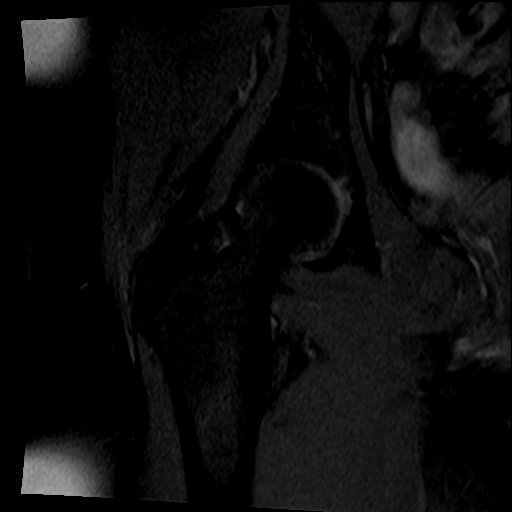
[im 18/23]
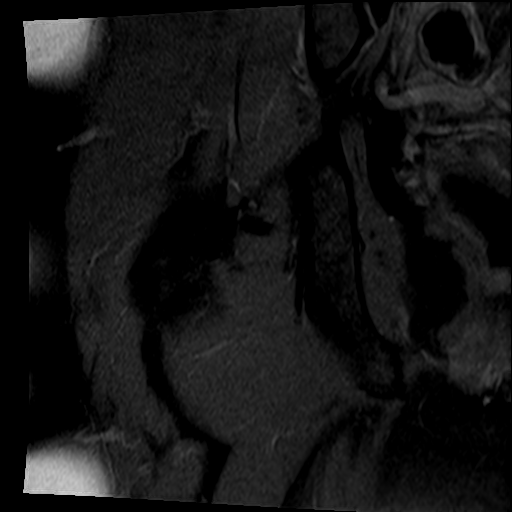
[im 23/23]
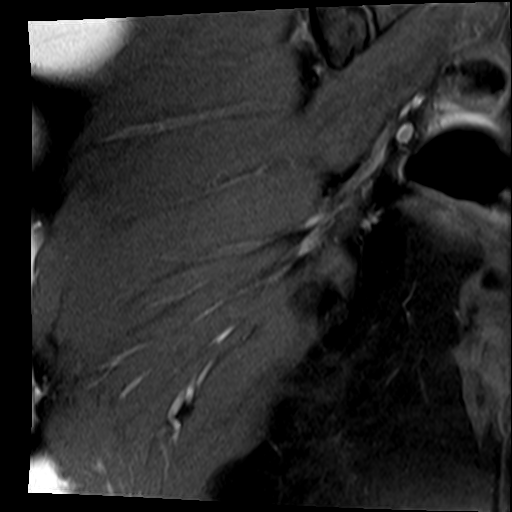

[40 of 40 positions shown; findings below may reference images not displayed]

FINDINGS: Bones: No acute fracture. No dislocation. No femoral head avascular
necrosis. Bony pelvis appears intact. Mild arthropathy of the pubic
symphysis and bilateral sacroiliac joints. No evidence of
sacroiliitis. Degenerative disc disease is partially visualized at
L5-S1. No bone marrow edema or suspicious marrow replacing bone
lesion.

Articular cartilage and labrum

Articular cartilage: No chondral defect. No subchondral marrow
signal changes.

Labrum: Evaluation is limited in the absence of intra-articular
contrast. 6 x 2 mm ovoid cystic structure located adjacent to the
anterosuperior labrum which may could reflect a small paralabral
cyst related to occult tear (series 5, image 10; series 6, image
17).

Joint or bursal effusion

Joint effusion:  No joint effusion.

Bursae: No abnormal bursal fluid collection.

Muscles and tendons

Muscles and tendons: Mild tendinosis of the bilateral gluteus medius
and minimus tendons, left worse than right. Tendinosis of the left
hamstring tendon origin. Normal muscle bulk and signal intensity
without edema, atrophy, or fatty infiltration.

Other findings

Miscellaneous: There is a elongated T2 hyperintense structure
located along the posterolateral aspect of the proximal left femoral
diaphysis measuring approximately 4.0 x 1.1 cm (series 3, images
17-20), incompletely characterized.
IMPRESSION: 1. No acute osseous abnormality or significant arthropathy of the
right hip. Subcentimeter cystic structure located adjacent to the
anterosuperior labrum may reflect a small paralabral cyst related to
occult labral tear.
2. Mild tendinosis of the bilateral gluteus medius and minimus
tendons, left worse than right.
3. Incompletely characterized 4.0 x 1.1 cm elongated cystic
appearing structure located along the posterolateral aspect of the
proximal left femoral diaphysis. This may potentially represent a
ganglion cyst associated with the gluteus maximus tendon insertion.
Further evaluation with dedicated MRI of the left femur is
recommended.
4. Tendinosis of the left hamstring tendon origin.

## 2021-06-07 ENCOUNTER — Encounter: Payer: Self-pay | Admitting: Family Medicine

## 2021-06-07 NOTE — Telephone Encounter (Signed)
Pt called asking about status of getting and endocrinology referral since [facility] will not have an appointment for several months and would like something sooner. ? ?Please call asap to discuss. ?

## 2021-06-09 ENCOUNTER — Other Ambulatory Visit: Payer: Self-pay

## 2021-06-09 DIAGNOSIS — E1165 Type 2 diabetes mellitus with hyperglycemia: Secondary | ICD-10-CM

## 2021-06-09 MED ORDER — METFORMIN HCL 500 MG PO TABS: 500.0000 mg | ORAL_TABLET | Freq: Two times a day (BID) | ORAL | 5 refills | Status: DC

## 2021-06-22 ENCOUNTER — Encounter: Payer: Self-pay | Admitting: Obstetrics & Gynecology

## 2021-06-22 ENCOUNTER — Other Ambulatory Visit (HOSPITAL_COMMUNITY)
Admission: RE | Admit: 2021-06-22 | Discharge: 2021-06-22 | Disposition: A | Discharge status: Home / Self Care | Payer: BC Managed Care – PPO | Source: Ambulatory Visit | Attending: Obstetrics & Gynecology | Admitting: Obstetrics & Gynecology

## 2021-06-22 ENCOUNTER — Ambulatory Visit: Payer: BC Managed Care – PPO | Admitting: Obstetrics & Gynecology

## 2021-06-22 VITALS — BP 106/64

## 2021-06-22 DIAGNOSIS — N898 Other specified noninflammatory disorders of vagina: Secondary | ICD-10-CM | POA: Diagnosis not present

## 2021-06-22 DIAGNOSIS — R87610 Atypical squamous cells of undetermined significance on cytologic smear of cervix (ASC-US): Secondary | ICD-10-CM | POA: Insufficient documentation

## 2021-06-22 DIAGNOSIS — D069 Carcinoma in situ of cervix, unspecified: Secondary | ICD-10-CM | POA: Diagnosis not present

## 2021-06-22 DIAGNOSIS — N72 Inflammatory disease of cervix uteri: Secondary | ICD-10-CM | POA: Diagnosis not present

## 2021-06-22 LAB — WET PREP FOR TRICH, YEAST, CLUE

## 2021-06-22 MED ORDER — TINIDAZOLE 500 MG PO TABS: 1000.0000 mg | ORAL_TABLET | Freq: Two times a day (BID) | ORAL | 0 refills | Status: AC

## 2021-06-22 NOTE — Progress Notes (Signed)
? ? ?  Tonya Suarez 1970/12/23 [id number] ? ? ?     51 y.o.  G2P2L2  ? ?RP: ASCUS x 2 for Colposcopy ? ?HPI: ASCUS/HPV HR Negative on 05/11/21.  Suggestive of BV on Pap.  Pap 08/2018 with ASCUS/HPV HR Neg as well. ? ? ?OB History  ?Gravida Para Term Preterm AB Living  ?'2 2 2     2  '$ ?SAB IAB Ectopic Multiple Live Births  ?        2  ?  ?# Outcome Date GA Lbr Len/2nd Weight Sex Delivery Anes PTL Lv  ?2 Term      Vag-Spont   LIV  ?1 Term      Vag-Spont   LIV  ? ? ?Past medical history,surgical history, problem list, medications, allergies, family history and social history were all reviewed and documented in the EPIC chart. ? ? ?Directed ROS with pertinent positives and negatives documented in the history of present illness/assessment and plan. ? ?Exam: ? ?Vitals:  ? 06/22/21 1429  ?BP: 106/64  ? ?General appearance:  Normal ? ?Colposcopy Procedure Note ?[name] ?06/22/2021 ? ?Indications:  ASCUS x 2 with HPV HR Neg ? ?Procedure Details  ?The risks and benefits of the procedure and Written informed consent obtained. ? ?Speculum placed in vagina and excellent visualization of cervix achieved, cervix swabbed x 3 with acetic acid solution. ? ?Findings: ? ?Cervix colposcopy: Physical Exam ?Genitourinary: ? ? ? ?  ? ?Vaginal colposcopy: Normal ? ?Vulvar colposcopy: Normal ? ?Perirectal colposcopy: Normal ? ?The cervix was sprayed with [place] before performing the cervical biopsies. ? ?Specimens: Wet prep. Cervical Bxs at 3 and 9 O'Clock. ? ?Complications: None, good hemostasis with Silver Nitrate ?Marland Kitchen ?Plan:  Per cervical Bx results ? ? ?Assessment/Plan:  51 y.o. [id number]  ? ?1. ASCUS of cervix with negative high risk HPV ?ASCUS/HPV HR Negative on 05/11/21.  Suggestive of BV on Pap.  Pap 08/2018 with ASCUS/HPV HR Neg as well.  Colposcopy procedure and findings reviewed.  Post procedure precautions.  Management per Cervical Bx results. ?- Colposcopy ?- Surgical pathology( [place]/ [facility]) ? ?2. Vaginal  discharge ?Confirmed BV on Wet prep.  Will treat with Tinidazole 2 tab PO BID x 2 days.  Usage reviewed and prescription sent to pharmacy.  Boric Acid/Probiotic for prevention suggested. ?- WET PREP FOR [place], YEAST, CLUE ? ?Other orders ?- tinidazole (TINDAMAX) 500 MG tablet; Take 2 tablets (1,000 mg total) by mouth 2 (two) times daily for 2 days.  ? ?Princess Bruins MD, 2:49 PM 06/22/2021 ? ? ? ?  ?

## 2021-06-24 LAB — SURGICAL PATHOLOGY

## 2021-07-01 ENCOUNTER — Other Ambulatory Visit: Payer: Self-pay

## 2021-07-01 DIAGNOSIS — N871 Moderate cervical dysplasia: Secondary | ICD-10-CM

## 2021-07-01 DIAGNOSIS — D069 Carcinoma in situ of cervix, unspecified: Secondary | ICD-10-CM

## 2021-07-22 ENCOUNTER — Encounter: Payer: Self-pay | Admitting: Obstetrics & Gynecology

## 2021-07-22 ENCOUNTER — Other Ambulatory Visit (HOSPITAL_COMMUNITY)
Admission: RE | Admit: 2021-07-22 | Discharge: 2021-07-22 | Disposition: A | Discharge status: Home / Self Care | Payer: BC Managed Care – PPO | Source: Ambulatory Visit | Attending: Obstetrics & Gynecology | Admitting: Obstetrics & Gynecology

## 2021-07-22 ENCOUNTER — Ambulatory Visit: Payer: BC Managed Care – PPO | Admitting: Obstetrics & Gynecology

## 2021-07-22 VITALS — BP 120/80 | HR 109

## 2021-07-22 DIAGNOSIS — N870 Mild cervical dysplasia: Secondary | ICD-10-CM | POA: Diagnosis not present

## 2021-07-22 DIAGNOSIS — D069 Carcinoma in situ of cervix, unspecified: Secondary | ICD-10-CM

## 2021-07-22 NOTE — Progress Notes (Signed)
Tonya Suarez 07/13/1970 [id number]        50 y.o.  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  HR HPR Neg.  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     OB History  Gravida Para Term Preterm AB Living  '2 2 2     2  '$ SAB IAB Ectopic Multiple Live Births          2    # Outcome Date GA Lbr Len/2nd Weight Sex Delivery Anes PTL Lv  2 Term      Vag-Spont   LIV  1 Term      Vag-Spont   LIV    Past medical history,surgical history, problem list, medications, allergies, family history and social history were all reviewed and documented in the EPIC chart.   Directed ROS with pertinent positives and negatives documented in the history of present illness/assessment and plan.  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   Procedure:  LEEP (Loop electrosurgical excision procedure) Description of Operation:  After the patient was verbally counseled the patient was placed in the low lithotomy position.  A coated speculum was inserted into the vagina and colposcopic examination was performed with 4% acidic acid with findings noted above.  The margins of the lesion and transformation zone were well visible under colposcopy.  Approximately 20 cc's of 1% xylocaine with epinephrine was used for a paracervical block and infiltrated deep near the outer margin of the transformation zone circumferentially at 12, 3, 6, and 9 o'clock positions for the intracervical anesthesia.  The [facility] Electrosurgical Generator was then turned on after the  patient was grounded with pad electrode on her thigh and jewelry removed.  The settings on the generator were Blend 1 current 70 watts cut and 70 watts on the coagulation mode.  A White loop electrode was utilized to exercise the atypical transformation zone.  The tip of the electrode was placed 3 mm from the edge of the lesion at 3, 6, 9 and 12 o'clock position.  The electrode was moved slowly over the lesion was within the loop limits.  A vaginal wall retractor was not used.  The loop was then repositioned and the finger switch on the hand held piece was activated ( or footpedal depressed).  A slight pressure on the shaft was applied and the loop was extended into the tissue up to its crossbar to a depth of 8 mm, then with steady, slow motion across and underneath the endocervical button was excised.  The loop electrode was replaced with ball electrode set at 50 watts and the base of the crater was fulgurated circumferentially.  Monsell's paint was then applied for additional hemostasis.  A pin was placed at 9 o'clock position of cervical biopsy specimen for orientation, and placed in formation fixative for pathology evaluation.  Patient tolerated the procedure well with minimal blood loss and without any complications.  After the procedure patient left office with stable vital signs and instructions sheet.    Assessment/Plan:  51 y.o. G2P2L2  1. High grade squamous intraepithelial lesion (HGSIL), grade 3 CIN, on biopsy of cervix ASCUS x 2.  HR HPR Neg.  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  LEEP done under Paracervical and Intracervical Block.  No Cx.  Management per pathology report. - LEEP with colposcopy - Surgical pathology( [place]/ [facility])   Counseling on Severe Cervical Dysplasia, management with LEEP and follow up and further management per pathology report for 15 minutes.  Princess Bruins MD, 11:43 AM 07/22/2021

## 2021-07-26 LAB — SURGICAL PATHOLOGY

## 2021-07-27 ENCOUNTER — Encounter: Payer: Self-pay | Admitting: Obstetrics & Gynecology

## 2021-07-29 ENCOUNTER — Other Ambulatory Visit: Payer: Self-pay | Admitting: Family Medicine

## 2021-08-04 DIAGNOSIS — Z1231 Encounter for screening mammogram for malignant neoplasm of breast: Secondary | ICD-10-CM | POA: Diagnosis not present

## 2021-08-04 LAB — HM MAMMOGRAPHY

## 2021-09-02 ENCOUNTER — Encounter: Payer: Self-pay | Admitting: Family Medicine

## 2021-09-02 ENCOUNTER — Ambulatory Visit: Payer: BC Managed Care – PPO | Admitting: Family Medicine

## 2021-09-02 VITALS — BP 104/70 | HR 68 | Temp 97.9°F | Ht 67.5 in | Wt 241.0 lb

## 2021-09-02 DIAGNOSIS — F3342 Major depressive disorder, recurrent, in full remission: Secondary | ICD-10-CM

## 2021-09-02 DIAGNOSIS — E1165 Type 2 diabetes mellitus with hyperglycemia: Secondary | ICD-10-CM

## 2021-09-02 DIAGNOSIS — E785 Hyperlipidemia, unspecified: Secondary | ICD-10-CM

## 2021-09-02 DIAGNOSIS — E1169 Type 2 diabetes mellitus with other specified complication: Secondary | ICD-10-CM | POA: Diagnosis not present

## 2021-09-02 DIAGNOSIS — I10 Essential (primary) hypertension: Secondary | ICD-10-CM | POA: Diagnosis not present

## 2021-09-02 MED ORDER — OZEMPIC (0.25 OR 0.5 MG/DOSE) 2 MG/1.5ML ~~LOC~~ SOPN: PEN_INJECTOR | SUBCUTANEOUS | 0 refills | Status: DC

## 2021-09-02 MED ORDER — FREESTYLE LIBRE 3 SENSOR MISC: 12 refills | Status: DC

## 2021-09-02 NOTE — [place] Notes (Signed)
Phone [phone] In person visit   Subjective:   Tonya Suarez is a 51 y.o. year old very pleasant female patient who presents for/with See problem oriented charting Chief Complaint  Patient presents with   Follow-up   Hypertension   Diabetes    Wants to see if insurance will cover ozempic and CGM. Metformin is hard on her stomach.   Past Medical History-  Patient Active Problem List   Diagnosis Date Noted   Poorly controlled diabetes mellitus ([place]) 05/13/2021    Priority: High   Former smoker 07/19/2018    Priority: High   Hyperlipidemia associated with type 2 diabetes mellitus ([place]) 05/25/2021    Priority: Medium    Constipation 07/19/2018    Priority: Medium    History of adenomatous polyp of colon 09/23/2016    Priority: Medium    Migraines 05/06/2016    Priority: Medium    HTN (hypertension) 11/21/2011    Priority: Medium    Depression 09/06/2006    Priority: Medium    Restless legs 07/19/2018    Priority: Low   Ingrown toenail 12/20/2016    Priority: Low   Family history of cancer of GI tract 12/06/2012    Priority: Low   MENORRHAGIA 09/23/2009    Priority: Low   PLANTAR FASCIITIS 09/20/2007    Priority: Low   Osteoarthritis 09/19/2006    Priority: Low   Allergic rhinitis 09/06/2006    Priority: Low   GERD 09/06/2006    Priority: Low   Recurrent major depressive disorder, in full remission ([place]) 05/11/2021   Bilateral sciatica 12/08/2020   Mass of thigh, left 08/26/2020   Pain in right hand 09/27/2018   B12 deficiency 07/19/2018    Medications- reviewed and updated Current Outpatient Medications  Medication Sig Dispense Refill   Continuous Blood Gluc Sensor (FREESTYLE LIBRE 3 SENSOR) MISC Quantity: 2 sensors/month NDC# [phone] Sensor Refills: PRN or 12 refills annually 2 each 12   Semaglutide,0.25 or 0.'5MG'$ /DOS, (OZEMPIC, 0.25 OR 0.5 MG/DOSE,) 2 MG/1.5ML SOPN Inject 0.25 mg as directed once a week for 28 days, THEN 0.5 mg once a week  for 28 days. Patient did not tolerate metformin- we can complete PA if needed.Marland Kitchen 2.25 mL 0   amLODipine (NORVASC) 2.5 MG tablet TAKE 1 TABLET BY MOUTH EVERY DAY 90 tablet 3   azelastine (ASTELIN) 0.1 % nasal spray Place 2 sprays into both nostrils 2 (two) times daily. 90 mL 3   clotrimazole-betamethasone (LOTRISONE) cream Apply 1 application topically 2 (two) times daily. 45 g 1   cyclobenzaprine (FLEXERIL) 5 MG tablet Take 1 tablet (5 mg total) by mouth 3 (three) times daily as needed for muscle spasms (dont drive for 8 hours after taking). 30 tablet 1   diclofenac Sodium (VOLTAREN) 1 % GEL Apply 4 g topically 4 (four) times daily. To affected joint. 100 g 11   fluticasone (FLONASE) 50 MCG/ACT nasal spray SPRAY 2 SPRAYS INTO EACH NOSTRIL EVERY DAY 48 mL 3   gabapentin (NEURONTIN) 300 MG capsule TAKE 2 CAPSULES BY MOUTH AT BEDTIME 180 capsule 1   hydrochlorothiazide (HYDRODIURIL) 25 MG tablet TAKE 1 TABLET (25 MG TOTAL) BY MOUTH DAILY. 90 tablet 3   meloxicam (MOBIC) 7.5 MG tablet TAKE 1 TABLET (7.5 MG TOTAL) BY MOUTH DAILY AS NEEDED. FOR PAIN 90 tablet 1   pantoprazole (PROTONIX) 40 MG tablet TAKE 1 TABLET BY MOUTH EVERY DAY 90 tablet 3   No current facility-administered medications for this visit.     Objective:  BP 104/70   Pulse 68   Temp 97.9 F (36.6 C)   Ht 5' 7.5" (1.715 m)   Wt 241 lb (109.3 kg)   LMP 06/05/2018 Comment: BTL  SpO2 98%   BMI 37.19 kg/m  Gen: NAD, resting comfortably CV: RRR no murmurs rubs or gallops Lungs: CTAB no crackles, wheeze, rhonchi Ext: no edema Skin: warm, dry     Assessment and Plan   #just got back from [place]- alcohol slightly higher than usual and increased unhealthy food choices- could affect liver. Was on terbinafine before and now off  # Diabetes S: Medication: Metformin 500 mg twice daily but feels this is hard on her stomach- diarrhea up to daily but at least every 3 days and some nausea.  She is interested in taking Ozempic.  She  is also interested in CGM Lab Results  Component Value Date   HGBA1C 9.2 (H) 05/12/2021   HGBA1C 4.9 07/19/2018   A/P: Hopefully improved control-patient is not tolerating metformin though.  We previously tried to prescribe Ozempic but was denied-due to intolerance with metformin we will retrial and try to get up to at least 1 mg a follow-up -She also attributes mother's kidney failure to metformin-offered trial of extended release but she would prefer to avoid in light of this.  Mother also took NSAIDs which could have contributed -Still want to consider statin for poorly controlled hyperlipidemia but wait until  #Depression S:medication: none prior  Paxil 10 mg in past - trial counseling 2022- did a few sessions- not most helpful    09/02/2021    3:13 PM 05/11/2021    8:06 AM 11/03/2020    3:07 PM  Depression screen PHQ 2/9  Decreased Interest 0 0 0  Down, Depressed, Hopeless 0 0 0  PHQ - 2 Score 0 0 0  Altered sleeping 0 2 1  Tired, decreased energy 0 0 0  Change in appetite 0 0 0  Feeling bad or failure about yourself  0 0 0  Trouble concentrating 0 0 0  Moving slowly or fidgety/restless 0 0 0  Suicidal thoughts 0 0 0  PHQ-9 Score 0 2 1  Difficult doing work/chores Not difficult at all Not difficult at all Not difficult at all  A/P: Remains in full remission-continue without medication   #Hypertension S: Compliant with amlodipine 2.5 mg and hydrochlorothiazide 25 mg - no lightheadedness BP Readings from Last 3 Encounters:  09/02/21 104/70  07/22/21 120/80  06/22/21 106/64  A/P: Controlled. Continue current medications.      # Migraines with aura S: primarily uses [employer] migraine tablets. fiorcet in past.  A/P: doing well- continue to monitor   #Low back pain issues-history of bulging disc L5-S1 with lumbar radiculopathy-followed by Dr. Georgina Snell in past- reasonable lately -still on gabapentin  #Osteoarthritis- lumbar spine, knees, shoulders.  Meloxicam helpful  in the past as would like to avoid if possible due to hypertension-also discussed renal risk - hips bothering some and feels may need another shot in shoulder  # High grade squamous intraepithelial lesion (HGSIL), grade 3 CIN, on biopsy of cervix june 2023 led to LEEP- now will be on yearly pap smear schedule  Recommended follow up: Return in about 14 weeks (around 12/09/2021) for followup or sooner if needed.Schedule b4 you leave. Future Appointments  Date Time Provider [place]  12/09/2021  4:00 PM Marin Olp, MD [facility]  05/16/2022  9:40 AM Marin Olp, MD [facility]  05/17/2022  4:00 PM Princess Bruins, MD [facility] None    Lab/Order associations:   ICD-10-CM   1. Poorly controlled diabetes mellitus (HCC)  E11.65 Comprehensive metabolic panel    Hemoglobin A1c      Meds ordered this encounter  Medications   Semaglutide,0.25 or 0.'5MG'$ /DOS, (OZEMPIC, 0.25 OR 0.5 MG/DOSE,) 2 MG/1.5ML SOPN    Sig: Inject 0.25 mg as directed once a week for 28 days, THEN 0.5 mg once a week for 28 days. Patient did not tolerate metformin- we can complete PA if needed..    Dispense:  2.25 mL    Refill:  0    Please provide pen needles   Continuous Blood Gluc Sensor (FREESTYLE LIBRE 3 SENSOR) MISC    Sig: Quantity: 2 sensors/month NDC# [id number] Sensor Refills: PRN or 12 refills annually    Dispense:  2 each    Refill:  12    Return precautions advised.  Garret Reddish, MD

## 2021-09-02 NOTE — Patient Instructions (Addendum)
Health Maintenance Due  Topic Date Due   OPHTHALMOLOGY EXAM - next eye exam let them know you have diabetes Never done   Hopefully CGM would be covered  Also try to get ozempic covered- if after this initial prescription (2 months) you are doing well with med and covered will go to 1 mg dose. Reach out ot me to let me know  Recommended follow up: Return in about 14 weeks (around 12/09/2021) for followup or sooner if needed.Schedule b4 you leave. -cancel your September visit

## 2021-09-03 LAB — COMPREHENSIVE METABOLIC PANEL
ALT: 41 U/L — ABNORMAL HIGH (ref 0–35)
AST: 41 U/L — ABNORMAL HIGH (ref 0–37)
Albumin: 4.4 g/dL (ref 3.5–5.2)
Alkaline Phosphatase: 63 U/L (ref 39–117)
BUN: 12 mg/dL (ref 6–23)
CO2: 28 mEq/L (ref 19–32)
Calcium: 10.1 mg/dL (ref 8.4–10.5)
Chloride: 102 mEq/L (ref 96–112)
Creatinine, Ser: 0.98 mg/dL (ref 0.40–1.20)
GFR: 66.13 mL/min (ref >60.00)
Glucose, Bld: 108 mg/dL — ABNORMAL HIGH (ref 70–99)
Potassium: 4.6 mEq/L (ref 3.5–5.1)
Sodium: 138 mEq/L (ref 135–145)
Total Bilirubin: 0.6 mg/dL (ref 0.2–1.2)
Total Protein: 6.8 g/dL (ref 6.0–8.3)

## 2021-09-03 LAB — HEMOGLOBIN A1C: Hgb A1c MFr Bld: 4.7 % (ref 4.6–6.5)

## 2021-09-10 ENCOUNTER — Telehealth: Payer: Self-pay

## 2021-09-10 NOTE — Telephone Encounter (Signed)
Pt called second time. No answer. Message to voicemail: today's visit cancelled. Please return call on Monday to reschedule. No show letter mailed to pt.

## 2021-09-15 ENCOUNTER — Encounter: Payer: Self-pay | Admitting: Internal Medicine

## 2021-09-21 ENCOUNTER — Encounter: Payer: Self-pay | Admitting: Family Medicine

## 2021-09-29 ENCOUNTER — Ambulatory Visit (AMBULATORY_SURGERY_CENTER): Payer: Self-pay

## 2021-09-29 VITALS — Ht 67.5 in | Wt 236.0 lb

## 2021-09-29 DIAGNOSIS — Z800 Family history of malignant neoplasm of digestive organs: Secondary | ICD-10-CM

## 2021-09-29 DIAGNOSIS — Z86010 Personal history of colonic polyps: Secondary | ICD-10-CM

## 2021-09-29 MED ORDER — NA SULFATE-K SULFATE-MG SULF 17.5-3.13-1.6 GM/177ML PO SOLN: 1.0000 | ORAL | 0 refills | Status: DC

## 2021-09-29 NOTE — [place] Notes (Signed)
No egg or soy allergy known to patient  No issues known to pt with past sedation with any surgeries or procedures Patient denies ever being told they had issues or difficulty with intubation  No FH of Malignant Hyperthermia Pt is not on diet pills Pt is not on  home 02  Pt is not on blood thinners  Pt denies issues with constipation  No A fib or A flutter Have any cardiac testing pending--denies Pt instructed to use Single[employer].[email or web address] or GoodRx for a price reduction on prep

## 2021-10-04 ENCOUNTER — Encounter: Payer: BC Managed Care – PPO | Admitting: Internal Medicine

## 2021-10-06 ENCOUNTER — Ambulatory Visit: Payer: Self-pay

## 2021-10-06 ENCOUNTER — Ambulatory Visit: Payer: BC Managed Care – PPO | Admitting: Family Medicine

## 2021-10-06 VITALS — BP 140/86 | HR 78 | Ht 67.5 in | Wt 235.0 lb

## 2021-10-06 DIAGNOSIS — M25511 Pain in right shoulder: Secondary | ICD-10-CM

## 2021-10-06 DIAGNOSIS — G8929 Other chronic pain: Secondary | ICD-10-CM

## 2021-10-06 NOTE — [place] Notes (Unsigned)
   I, Peterson Lombard, LAT, ATC acting as a scribe for [name], MD.  Tonya Suarez is a 51 y.o. female who presents to [place] at [facility] today for cont'd chronic R shoulder pain. Pt was last seen by Dr. Georgina Snell on 04/20/21 and was given a R subacromial steroid injection and was advised to cont HEP. Today, pt reports R shoulder pain has only returned over the last month and a half. Pt locates pain to the anterior aspect of the R [place] joint.   Dx imaging: 03/04/21 C-spine & R shoulder XR  Pertinent review of systems: No fevers or chills  Relevant historical information: Hypertension.  Diabetes.   Exam:  BP (!) 140/86   Pulse 78   Ht 5' 7.5" (1.715 m)   Wt 235 lb (106.6 kg)   LMP 06/05/2018 Comment: BTL  SpO2 99%   BMI 36.26 kg/m  General: Well Developed, well nourished, and in no acute distress.   MSK: Right shoulder: Normal-appearing Nontender. Normal motion pain with abduction. Positive Hawkins and Neer's test.  Positive empty can test. Intact strength.    Lab and Radiology Results  Procedure: Real-time Ultrasound Guided Injection of right shoulder subacromial bursa Device: Philips Affiniti 50G Images permanently stored and available for review in PACS Verbal informed consent obtained.  Discussed risks and benefits of procedure. Warned about infection, bleeding, hyperglycemia damage to structures among others. Patient expresses understanding and agreement Time-out conducted.   Noted no overlying erythema, induration, or other signs of local infection.   Skin prepped in a sterile fashion.   Local anesthesia: Topical Ethyl chloride.   With sterile technique and under real time ultrasound guidance: 40 mg of Kenalog and 2 mL of Marcaine injected into subacromial bursa. Fluid seen entering the bursa.   Completed without difficulty   Pain immediately resolved suggesting accurate placement of the medication.   Advised to call if fevers/chills,  erythema, induration, drainage, or persistent bleeding.   Images permanently stored and available for review in the ultrasound unit.  Impression: Technically successful ultrasound guided injection.         Assessment and Plan: 51 y.o. female with right shoulder pain thought to be due to impingement/bursitis. She last had a subacromial injection about 6 months ago which worked well until recently.  Plan for repeat steroid injection.  Continue home exercise program previously taught.  Recheck back as needed.  Of note diabetes is now much better controlled with A1c less than 5 on Ozempic.   PDMP not reviewed this encounter. Orders Placed This Encounter  Procedures   [place] LIMITED JOINT SPACE STRUCTURES UP RIGHT(NO LINKED CHARGES)    Order Specific Question:   Reason for Exam (SYMPTOM  OR DIAGNOSIS REQUIRED)    Answer:   right shoulder pain    Order Specific Question:   Preferred imaging location?    Answer:   [place]   No orders of the defined types were placed in this encounter.    Discussed warning signs or symptoms. Please see discharge instructions. Patient expresses understanding.   The above documentation has been reviewed and is accurate and complete [name], M.D.

## 2021-10-06 NOTE — Patient Instructions (Addendum)
Thank you for coming in today.   You received an injection today. Seek immediate medical attention if the joint becomes red, extremely painful, or is oozing fluid.   Recheck as needed.    

## 2021-10-08 ENCOUNTER — Encounter: Payer: Self-pay | Admitting: Internal Medicine

## 2021-10-15 DIAGNOSIS — R2242 Localized swelling, mass and lump, left lower limb: Secondary | ICD-10-CM | POA: Diagnosis not present

## 2021-10-18 ENCOUNTER — Telehealth: Payer: Self-pay | Admitting: Internal Medicine

## 2021-10-18 ENCOUNTER — Encounter: Payer: BC Managed Care – PPO | Admitting: Internal Medicine

## 2021-10-18 NOTE — Telephone Encounter (Signed)
Good Morning Dr. Henrene Pastor,  Patient called stating that she needed to cancel her colonoscopy with you today at 1:30 due to having a family emergency over the weekend and having to go out of town.  Patient was rescheduled for 10/9 at 1:30.

## 2021-10-19 DIAGNOSIS — R2242 Localized swelling, mass and lump, left lower limb: Secondary | ICD-10-CM | POA: Diagnosis not present

## 2021-10-29 ENCOUNTER — Other Ambulatory Visit: Payer: Self-pay | Admitting: Family Medicine

## 2021-10-29 NOTE — Telephone Encounter (Signed)
Rx refill request approved per Dr. Corey's orders. 

## 2021-11-05 ENCOUNTER — Other Ambulatory Visit: Payer: Self-pay | Admitting: Family Medicine

## 2021-11-11 ENCOUNTER — Ambulatory Visit: Payer: BC Managed Care – PPO | Admitting: Family Medicine

## 2021-11-15 ENCOUNTER — Encounter: Payer: Self-pay | Admitting: *Deleted

## 2021-11-26 ENCOUNTER — Telehealth: Payer: Self-pay | Admitting: Internal Medicine

## 2021-11-26 NOTE — Telephone Encounter (Signed)
Good afternoon Dr. Henrene Pastor,  Patient called stating that she needed to cancel her colonoscopy for 12/05/2022 at 4 due to a death in the family.  Patient was rescheduled for 10/30 at 3

## 2021-11-29 ENCOUNTER — Encounter: Payer: BC Managed Care – PPO | Admitting: Internal Medicine

## 2021-12-01 ENCOUNTER — Ambulatory Visit (INDEPENDENT_AMBULATORY_CARE_PROVIDER_SITE_OTHER): Payer: BC Managed Care – PPO | Admitting: Family Medicine

## 2021-12-01 ENCOUNTER — Ambulatory Visit (INDEPENDENT_AMBULATORY_CARE_PROVIDER_SITE_OTHER): Payer: BC Managed Care – PPO

## 2021-12-01 VITALS — BP 116/80 | HR 72 | Ht 67.5 in

## 2021-12-01 DIAGNOSIS — M79672 Pain in left foot: Secondary | ICD-10-CM | POA: Diagnosis not present

## 2021-12-01 DIAGNOSIS — M7989 Other specified soft tissue disorders: Secondary | ICD-10-CM | POA: Diagnosis not present

## 2021-12-01 DIAGNOSIS — S92502A Displaced unspecified fracture of left lesser toe(s), initial encounter for closed fracture: Secondary | ICD-10-CM

## 2021-12-01 NOTE — Patient Instructions (Signed)
Thank you for coming in today.   Recheck in 1 month.   Please go to [facility] supply to get the post op shoe we talked about today. You may also be able to get it from [employer].    Get a Steel Turf Toe insole.  Do a Producer, television/film/video for [employer]

## 2021-12-01 NOTE — [place] Notes (Signed)
   I, Peterson Lombard, LAT, ATC acting as a scribe for [name], MD.  Tonya Suarez is a 51 y.o. female who presents to [place] at [facility] today for left foot pain pt was previously seen by Dr. Georgina Snell on 10/06/21 for chronic R shoulder pain.  Today, patient reports she was on a family beach trip and was doing a 3-legged race and thinks she hit the foot into the sand. Injury occurred on 9/30. Patient locates pain to the MTP joint areas of the 3rd-5th joints.   She wears steel boots at work.  She has been able to wear these steel toed boots at work and has been attending work.  Left foot swelling: yes Treatments tried: ice  Pertinent review of systems: No fevers or chills  Relevant historical information: Hypertension.  Hyperlipidemia.  Diabetes.   Exam:  BP 116/80   Pulse 72   Ht 5' 7.5" (1.715 m)   LMP 06/05/2018 Comment: BTL  SpO2 99%   BMI 36.26 kg/m  General: Well Developed, well nourished, and in no acute distress.   MSK: Left foot: Swollen across the dorsal midfoot and forefoot.  Tender palpation fifth toe.  Tender palpation mildly along metatarsal heads dorsally 3-5. Pulses capillary refill and sensation are intact distally.    Lab and Radiology Results  X-ray images left foot obtained today personally independently interpreted Fracture base of the fifth toe proximal phalanx medial aspect with minimal displacement present. No definitive fractures otherwise present in the foot per my read.  Questionable fracture midshaft proximal fourth phalanx present. Await formal radiology overread.    Assessment and Plan: 51 y.o. female with left toe fracture fifth toe.  Plan for postop shoe and turf toe steel insoles.  Check back in 1 month.   PDMP not reviewed this encounter. Orders Placed This Encounter  Procedures   DG Foot Complete Left    Standing Status:   Future    Number of Occurrences:   1    Standing Expiration Date:   12/02/2022     Order Specific Question:   Reason for Exam (SYMPTOM  OR DIAGNOSIS REQUIRED)    Answer:   Left foot pain    Order Specific Question:   Preferred imaging location?    Answer:   [facility]    Order Specific Question:   Is patient pregnant?    Answer:   No   No orders of the defined types were placed in this encounter.    Discussed warning signs or symptoms. Please see discharge instructions. Patient expresses understanding.   The above documentation has been reviewed and is accurate and complete [name], M.D.

## 2021-12-03 ENCOUNTER — Other Ambulatory Visit: Payer: Self-pay | Admitting: Family Medicine

## 2021-12-03 NOTE — [place] Notes (Signed)
Left foot x-ray shows a broken pinky toe like I showed you in clinic.  We do not see a broken bone elsewhere in the foot.

## 2021-12-06 ENCOUNTER — Telehealth: Payer: Self-pay | Admitting: Family Medicine

## 2021-12-06 NOTE — Telephone Encounter (Signed)
Pt inquiring about PA on Ozempic  Patient Name: Tonya Suarez Gender: Female DOB: 1970-05-01 Age: 51 Y 3 M 7 D Return Phone Number: [phone] (Primary) Address: City/ State/ Zip: [place]  [address] Client [place] [facility] [place] Client Site [place] [facility] [place] Provider Garret Reddish- MD Contact Type Call Who Is Calling Patient / Member / Family / Caregiver Call Type Triage / Clinical Relationship To Patient Self Return Phone Number [phone] (Primary) Chief Complaint Prescription Refill or Medication Request (non symptomatic) Reason for Call Medication Question / Request Initial Comment Caller states she called for her Ozempic to be refilled and the pharmacy told her they need a prior authorization before they can refill. Translation No Nurse Assessment Nurse: Donna Christen, RN, Legrand Como Date/Time Eilene Ghazi Time): 12/04/2021 4:12:21 PM Confirm and document reason for call. If symptomatic, describe symptoms. ---Caller states she called for her Ozempic to be refilled and the pharmacy told her they need a prior authorization before they can refill. Does the patient have any new or worsening symptoms? ---No Nurse: Donna Christen, RN, Legrand Como Date/Time (Eastern Time): 12/04/2021 4:13:19 PM Please select the assessment type ---Refill Additional Documentation ---Ozempic Does the patient have enough medication to last until the office opens? ---Unable to obtain loaner dose from Pharmacy Does the client directives allow for assistance with medications after hours? ---No Disp. Time Eilene Ghazi Time) Disposition Final User 12/04/2021 4:15:00 PM Clinical Call Yes Donna Christen RN, Legrand Como Final Disposition 12/04/2021 4:15:00 PM Clinical Call Yes Donna Christen, RN, Legrand Como

## 2021-12-09 ENCOUNTER — Ambulatory Visit: Payer: BC Managed Care – PPO | Admitting: Family Medicine

## 2021-12-09 ENCOUNTER — Encounter: Payer: Self-pay | Admitting: Family Medicine

## 2021-12-09 VITALS — BP 110/70 | HR 80 | Temp 97.5°F | Ht 67.5 in | Wt 225.8 lb

## 2021-12-09 DIAGNOSIS — I10 Essential (primary) hypertension: Secondary | ICD-10-CM | POA: Diagnosis not present

## 2021-12-09 DIAGNOSIS — E785 Hyperlipidemia, unspecified: Secondary | ICD-10-CM | POA: Diagnosis not present

## 2021-12-09 DIAGNOSIS — Z23 Encounter for immunization: Secondary | ICD-10-CM

## 2021-12-09 DIAGNOSIS — E1169 Type 2 diabetes mellitus with other specified complication: Secondary | ICD-10-CM

## 2021-12-09 DIAGNOSIS — E119 Type 2 diabetes mellitus without complications: Secondary | ICD-10-CM

## 2021-12-09 LAB — HM DIABETES EYE EXAM

## 2021-12-09 MED ORDER — ROSUVASTATIN CALCIUM 10 MG PO TABS: 10.0000 mg | ORAL_TABLET | ORAL | 3 refills | Status: DC

## 2021-12-09 MED ORDER — CYCLOBENZAPRINE HCL 5 MG PO TABS: 5.0000 mg | ORAL_TABLET | Freq: Three times a day (TID) | ORAL | 1 refills | Status: DC | PRN

## 2021-12-09 NOTE — Patient Instructions (Addendum)
willing to trial rosuvastatin 10 mg once a week to lower lipids and stabilize plaque- start this week and recheck in march  Thanks for doing labs If you have mychart- we will send your results within 3 business days of [place] receiving them.  If you do not have mychart- we will call you about results within 5 business days of [place] receiving them.  *please also note that you will see labs on mychart as soon as they post. I will later go in and write notes on them- will say "notes from Dr. Yong Channel"   Recommended follow up: Return for next already scheduled visit or sooner if needed.

## 2021-12-09 NOTE — Progress Notes (Signed)
Phone [phone] In person visit   Subjective:   Tonya Suarez is a 51 y.o. year old very pleasant female patient who presents for/with See problem oriented charting Chief Complaint  Patient presents with   Follow-up    Colonoscopy scheduled for 10/30.   Hypertension    Past Medical History-  Patient Active Problem List   Diagnosis Date Noted   Controlled diabetes mellitus ([place]) 05/13/2021    Priority: High   Former smoker 07/19/2018    Priority: High   Hyperlipidemia associated with type 2 diabetes mellitus ([place]) 05/25/2021    Priority: Medium    Constipation 07/19/2018    Priority: Medium    History of adenomatous polyp of colon 09/23/2016    Priority: Medium    Migraines 05/06/2016    Priority: Medium    HTN (hypertension) 11/21/2011    Priority: Medium    Depression 09/06/2006    Priority: Medium    Restless legs 07/19/2018    Priority: Low   Ingrown toenail 12/20/2016    Priority: Low   Family history of cancer of GI tract 12/06/2012    Priority: Low   MENORRHAGIA 09/23/2009    Priority: Low   PLANTAR FASCIITIS 09/20/2007    Priority: Low   Osteoarthritis 09/19/2006    Priority: Low   Allergic rhinitis 09/06/2006    Priority: Low   GERD 09/06/2006    Priority: Low   Recurrent major depressive disorder, in full remission ([place]) 05/11/2021   Bilateral sciatica 12/08/2020   Mass of thigh, left 08/26/2020   Pain in right hand 09/27/2018   B12 deficiency 07/19/2018    Medications- reviewed and updated Current Outpatient Medications  Medication Sig Dispense Refill   amLODipine (NORVASC) 2.5 MG tablet TAKE 1 TABLET BY MOUTH EVERY DAY 90 tablet 3   azelastine (ASTELIN) 0.1 % nasal spray Place 2 sprays into both nostrils 2 (two) times daily. 90 mL 3   clotrimazole-betamethasone (LOTRISONE) cream Apply 1 application topically 2 (two) times daily. 45 g 1   Continuous Blood Gluc Sensor (FREESTYLE LIBRE 3 SENSOR) MISC Quantity: 2 sensors/month NDC#  [phone] Sensor Refills: PRN or 12 refills annually 2 each 12   diclofenac Sodium (VOLTAREN) 1 % GEL Apply 4 g topically 4 (four) times daily. To affected joint. 100 g 11   fluticasone (FLONASE) 50 MCG/ACT nasal spray SPRAY 2 SPRAYS INTO EACH NOSTRIL EVERY DAY 48 mL 3   gabapentin (NEURONTIN) 300 MG capsule TAKE 2 CAPSULES BY MOUTH AT BEDTIME 180 capsule 1   hydrochlorothiazide (HYDRODIURIL) 25 MG tablet TAKE 1 TABLET (25 MG TOTAL) BY MOUTH DAILY. 90 tablet 3   meloxicam (MOBIC) 7.5 MG tablet TAKE 1 TABLET (7.5 MG TOTAL) BY MOUTH DAILY AS NEEDED. FOR PAIN 90 tablet 1   Na Sulfate-K Sulfate-Mg Sulf 17.5-3.13-1.6 GM/177ML SOLN Take 1 kit by mouth as directed. May use generic Suprep, no prior authorization. Take as directed. 354 mL 0   OZEMPIC, 0.25 OR 0.5 MG/DOSE, 2 MG/3ML SOPN INJECT 0.25 MG AS DIRECTED ONCE A WEEK FOR 28 DAYS, THEN 0.5 MG ONCE A WEEK FOR 28 DAYS. PATIENT DID NOT TOLERATE METFORMIN- WE CAN COMPLETE PA IF NEEDED.. 3 mL 5   pantoprazole (PROTONIX) 40 MG tablet TAKE 1 TABLET BY MOUTH EVERY DAY 90 tablet 3   rosuvastatin (CRESTOR) 10 MG tablet Take 1 tablet (10 mg total) by mouth once a week. 13 tablet 3   cyclobenzaprine (FLEXERIL) 5 MG tablet Take 1 tablet (5 mg total) by mouth 3 (  three) times daily as needed for muscle spasms (dont drive for 8 hours after taking). 30 tablet 1   No current facility-administered medications for this visit.     Objective:  BP 110/70   Pulse 80   Temp (!) 97.5 F (36.4 C)   Ht 5' 7.5" (1.715 m)   Wt 225 lb 12.8 oz (102.4 kg)   LMP 06/05/2018 Comment: BTL  SpO2 99%   BMI 34.84 kg/m  Gen: NAD, resting comfortably CV: RRR no murmurs rubs or gallops Lungs: CTAB no crackles, wheeze, rhonchi Ext: minimal edema Skin: warm, dry    Assessment and Plan    # Diabetes-new diagnosis with A1c of 9.2 on 05/12/2021 S: Medication:Trial Ozempic in July 2023- she thinks she is on the 0.25 mg dose and losing weight and a1c has been controlled -Did  not tolerate metformin due to GI side effects though A1c substantially improved CBGs- doesn't check sugars much. We tried for freestyle but expensive. Twice a week sugar- last check was 102- lowest she has seen Exercise and diet- getting more walking at work, hoping to start doing gym when gets off of work once settles into new schedule. She has tried to eat healthier in addition to benefit of ozempic- trying to do fruits for snacks and cut back on bread/pastas Lab Results  Component Value Date   HGBA1C 4.7 09/02/2021   HGBA1C 9.2 (H) 05/12/2021   HGBA1C 4.9 07/19/2018   A/P: diabetes- suspect well Controlled. Continue current medications. Also continue ozempic with weight loss benefit  #Depression S:medication: none prior  Paxil 10 mg in past - trial counseling 2022- did a few sessoins- not most helpful    12/09/2021    4:07 PM 09/02/2021    3:13 PM 05/11/2021    8:06 AM  Depression screen PHQ 2/9  Decreased Interest 0 0 0  Down, Depressed, Hopeless 0 0 0  PHQ - 2 Score 0 0 0  Altered sleeping 0 0 2  Tired, decreased energy 0 0 0  Change in appetite 0 0 0  Feeling bad or failure about yourself  0 0 0  Trouble concentrating 0 0 0  Moving slowly or fidgety/restless 0 0 0  Suicidal thoughts 0 0 0  PHQ-9 Score 0 0 2  Difficult doing work/chores Not difficult at all Not difficult at all Not difficult at all  A/P: Depression in remission without medication-continue to monitor   #Hypertension S: Compliant with amlodipine 2.5 mg and hydrochlorothiazide 25 mg  BP Readings from Last 3 Encounters:  12/09/21 110/70  12/01/21 116/80  10/06/21 (!) 140/86   A/P:   Controlled. Continue current medications.  As loses weight continue to watch BP- may need to decrease meds  #hyperlipidemia S: Medication:none  Lab Results  Component Value Date   CHOL 153 05/11/2021   HDL 43.90 05/11/2021   LDLCALC 87 05/11/2021   TRIG 108.0 05/11/2021   CHOLHDL 3 05/11/2021   A/P: willing to trial  rosuvastatin 10 mg once a week to lower lipids and stabilize plaque- start this week and recheck in march   # Migraines with aura S: primarily uses [employer] migraine tablets. fiorcet in past.  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 Continue current medications.    #Low back pain issues-history of bulging disc L5-S1 with lumbar radiculopathy-followed by Dr. Georgina Snell and on gabapentin- trialed  off but worsened and went back to just 1 and working. Refill flexieril- helpful when takes -Unfortunately also recently had left toe fracture of fifth toe-follow with Dr. Estanislado Pandy visit 12/01/2021 and currently in postop shoe- mainly using hard sole insert with work- lingering pain.    #Osteoarthritis- lumbar spine, knees, shoulders.  Meloxicam helpful in the past as would like to avoid if possible due to hypertension- using pretty much daily- warned of kidney, cardiac, GI bleed risks (pantoprazole counters risk)  #Thigh mass-3.6 x 1 cm on 08/03/2020 ordered by Dr. Berta Minor evaluated by Dr. Magda Bernheim with [facility] and thought to be benign-improved on follow-up MRIwith last visit 10/19/2021 and as needed follow-up only planned  #HM- scheduled for colonoscopy   Recommended follow up: Return for next already scheduled visit or sooner if needed. Future Appointments  Date Time Provider [place]  12/20/2021  3:00 PM Irene Shipper, MD [facility]  01/03/2022 11:30 AM Gregor Hams, MD [facility] None  05/16/2022  9:40 AM Marin Olp, MD [facility]  05/17/2022  4:00 PM Princess Bruins, MD [facility] None   Lab/Order associations:   ICD-10-CM   1. Controlled type 2 diabetes mellitus without complication, without long-term current use of insulin (HCC)  E11.9 Comprehensive metabolic panel    Hemoglobin A1c    2. Hypertension,  unspecified type  I10     3. Hyperlipidemia associated with type 2 diabetes mellitus ([place])  E11.69    E78.5     4. Need for immunization against influenza  Z23 Flu Vaccine QUAD 64mo+IM (Fluarix, Fluzone & Alfiuria Quad PF)     Meds ordered this encounter  Medications   cyclobenzaprine (FLEXERIL) 5 MG tablet    Sig: Take 1 tablet (5 mg total) by mouth 3 (three) times daily as needed for muscle spasms (dont drive for 8 hours after taking).    Dispense:  30 tablet    Refill:  1   rosuvastatin (CRESTOR) 10 MG tablet    Sig: Take 1 tablet (10 mg total) by mouth once a week.    Dispense:  13 tablet    Refill:  3    Return precautions advised.  Garret Reddish, MD

## 2021-12-10 LAB — COMPREHENSIVE METABOLIC PANEL
ALT: 22 U/L (ref 0–35)
AST: 25 U/L (ref 0–37)
Albumin: 4.2 g/dL (ref 3.5–5.2)
Alkaline Phosphatase: 63 U/L (ref 39–117)
BUN: 12 mg/dL (ref 6–23)
CO2: 26 mEq/L (ref 19–32)
Calcium: 9.8 mg/dL (ref 8.4–10.5)
Chloride: 103 mEq/L (ref 96–112)
Creatinine, Ser: 0.93 mg/dL (ref 0.40–1.20)
GFR: 70.28 mL/min (ref >60.00)
Glucose, Bld: 88 mg/dL (ref 70–99)
Potassium: 3.5 mEq/L (ref 3.5–5.1)
Sodium: 140 mEq/L (ref 135–145)
Total Bilirubin: 0.8 mg/dL (ref 0.2–1.2)
Total Protein: 6.8 g/dL (ref 6.0–8.3)

## 2021-12-10 LAB — HEMOGLOBIN A1C: Hgb A1c MFr Bld: 4.6 % (ref 4.6–6.5)

## 2021-12-20 ENCOUNTER — Encounter: Payer: BC Managed Care – PPO | Admitting: Internal Medicine

## 2022-01-03 ENCOUNTER — Ambulatory Visit: Payer: BC Managed Care – PPO | Admitting: Family Medicine

## 2022-01-03 NOTE — Progress Notes (Deleted)
   I, Peterson Lombard, LAT, ATC acting as a scribe for [name], MD.  Bernice Mcauliffe is a 51 y.o. female who presents to [place] at [facility] today for f/u L 5th toe fx that she injured on 9/30, while on a family beach trip and was doing a 3-legged race and thinks she hit the foot into the sand. Pt was last seen by Dr. Georgina Snell on 12/01/21 and was advised to wear a post-op shoe and turf toe insoles. Today, pt reports  Dx imaging: 12/01/21 L foot XR  Pertinent review of systems: ***  Relevant historical information: ***   Exam:  LMP 06/05/2018 Comment: BTL General: Well Developed, well nourished, and in no acute distress.   MSK: ***    Lab and Radiology Results No results found for this or any previous visit (from the past 72 hour(s)). No results found.     Assessment and Plan: 52 y.o. female with ***   PDMP not reviewed this encounter. No orders of the defined types were placed in this encounter.  No orders of the defined types were placed in this encounter.    Discussed warning signs or symptoms. Please see discharge instructions. Patient expresses understanding.   ***

## 2022-01-06 ENCOUNTER — Telehealth: Payer: Self-pay | Admitting: Family Medicine

## 2022-01-06 NOTE — Telephone Encounter (Signed)
Do you know how we check on this? This was when the people came into the office to perform the eye exams in clinic.

## 2022-01-06 NOTE — Telephone Encounter (Signed)
Patient states she completed Diabetic eye exam in PCP office sometime last month. States she was informed that her results would be added into mychart but she hasn't seen them. Please advise.

## 2022-01-21 NOTE — Telephone Encounter (Signed)
Copy of eye exam results given to Keba to abstract on 01/11/22

## 2022-03-03 ENCOUNTER — Ambulatory Visit: Payer: BC Managed Care – PPO | Admitting: Internal Medicine

## 2022-03-03 ENCOUNTER — Encounter: Payer: Self-pay | Admitting: Internal Medicine

## 2022-03-03 VITALS — BP 118/68 | HR 64 | Ht 70.0 in | Wt 230.0 lb

## 2022-03-03 DIAGNOSIS — Z800 Family history of malignant neoplasm of digestive organs: Secondary | ICD-10-CM

## 2022-03-03 DIAGNOSIS — M549 Dorsalgia, unspecified: Secondary | ICD-10-CM

## 2022-03-03 DIAGNOSIS — Z6833 Body mass index (BMI) 33.0-33.9, adult: Secondary | ICD-10-CM

## 2022-03-03 DIAGNOSIS — Z86010 Personal history of colonic polyps: Secondary | ICD-10-CM

## 2022-03-03 DIAGNOSIS — E6609 Other obesity due to excess calories: Secondary | ICD-10-CM | POA: Diagnosis not present

## 2022-03-03 MED ORDER — PANTOPRAZOLE SODIUM 40 MG PO TBEC: 40.0000 mg | DELAYED_RELEASE_TABLET | Freq: Every day | ORAL | 3 refills | Status: DC

## 2022-03-03 NOTE — Progress Notes (Signed)
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  Also, complaints of new onset mid upper back pain.  Patient was last seen in the office in March 2021.  At that time she was being treated for GERD that required chronic PPI symptoms.  Prior endoscopy normal.  Other issues were obesity, chronic constipation, and a family history of colon cancer as well as a personal history of adenomatous colon polyps.  The patient underwent previous colonoscopy and upper endoscopy September 16, 2016.  There is a history of colon cancer in her father less than age 92.  Colonoscopy revealed diminutive tubular adenoma and internal hemorrhoids.  Follow-up in 5 years for surveillance.  Upper endoscopy, as mentioned before, was normal.  Patient tells me that she continues on pantoprazole 40 mg daily for her GERD.  She will have occasional breakthrough symptoms with dietary discretion.  On demand as acids are used.  She is on Ozempic in hopes of weight loss.  He denies dysphagia.   Next, She states she had severe issues with coughing about 2 weeks ago.  Since that time she has had pain in her mid back between the shoulder blades.  This is exacerbated with direct touch or movements.  GI review of systems is otherwise negative.  REVIEW OF SYSTEMS:  All non-GI ROS negative. Past Medical History:  Diagnosis Date   Abnormal Pap smear of cervix    ascus hpv hr neg   Allergy    Arthritis    Chronic headaches    Depression    Diabetes mellitus without complication (HCC)    GERD (gastroesophageal reflux disease)    Hypertension    Low back pain    UTI (urinary tract infection)     Past Surgical History:  Procedure Laterality Date   CERVICAL BIOPSY  W/ LOOP ELECTRODE EXCISION     COLPOSCOPY     CIN2/3   TUBAL LIGATION      Social History Tonya Suarez   reports that she has quit smoking. Her smoking use included cigars. She has never used smokeless tobacco. She reports current alcohol use. She reports that she does not use drugs.  family history includes Cancer - Other in her sister; Colon cancer in her father, maternal aunt, and paternal grandmother; Colon polyps in her father and mother; Diabetes in her father and mother; Hyperlipidemia in her father and mother; Hypertension in her father and mother; Kidney disease in her mother; Lung cancer in her father; Stomach cancer in her sister.  Allergies  Allergen Reactions   Amoxicillin Swelling    Lip swelling       PHYSICAL EXAMINATION: Vital signs: BP 118/68   Pulse 64   Ht '5\' 10"'$  (1.778 m)   Wt 230 lb (104.3 kg)   LMP 06/05/2018 Comment: BTL  BMI 33.00 kg/m   Constitutional: generally well-appearing, no acute distress Psychiatric: alert and oriented x3, cooperative Eyes: extraocular movements intact, anicteric, conjunctiva pink Mouth: oral pharynx moist, no lesions Neck: supple no lymphadenopathy Cardiovascular: heart regular rate and rhythm, no murmur Lungs: clear to auscultation bilaterally Back: Muscular tenderness with palpation between shoulder blades Abdomen: soft, nontender, nondistended, no obvious ascites, no peritoneal signs, normal bowel sounds, no organomegaly Rectal: Deferred until colonoscopy Extremities: no clubbing, cyanosis, or lower extremity edema bilaterally Skin: no lesions on visible extremities Neuro: No focal deficits.  Cranial nerves  intact  ASSESSMENT:  1.  Personal history of diminutive adenoma 2018 2.  Family history of colon cancer in father less than age 16 3.  Chronic GERD.  Normal prior endoscopy.  Some breakthrough symptoms despite once daily pantoprazole.  On-demand antacids helpful. 4.  Obesity.  Ongoing.  On Ozempic.  Being followed by PCP 5.  Musculoskeletal back pain  PLAN:  1.  Reflux precautions 2.  Weight loss 3.  Continue  pantoprazole 40 mg daily.  Prescription refilled.  Medication list reviewed 4.  Schedule surveillance colonoscopy.The nature of the procedure, as well as the risks, benefits, and alternatives were carefully and thoroughly reviewed with the patient. Ample time for discussion and questions allowed. The patient understood, was satisfied, and agreed to proceed. 5.  Adjust diabetic medications preprocedure to avoid unwanted hypoglycemia 6.  Reassurance regarding back pain.  Analgesics as needed.

## 2022-03-03 NOTE — Patient Instructions (Signed)
_______________________________________________________  If you are age 54 or older, your body mass index should be between 23-30. Your Body mass index is 33 kg/m. If this is out of the aforementioned range listed, please consider follow up with your Primary Care Provider.  If you are age 59 or younger, your body mass index should be between 19-25. Your Body mass index is 33 kg/m. If this is out of the aformentioned range listed, please consider follow up with your Primary Care Provider.   ________________________________________________________  The [facility] providers would like to encourage you to use [facility] to communicate with providers for non-urgent requests or questions.  Due to long hold times on the telephone, sending your provider a message by [facility] may be a faster and more efficient way to get a response.  Please allow 48 business hours for a response.  Please remember that this is for non-urgent requests.  _______________________________________________________  Tonya Suarez have been scheduled for a colonoscopy. Please follow written instructions given to you at your visit today.  Please pick up your prep supplies at the pharmacy within the next 1-3 days. If you use inhalers (even only as needed), please bring them with you on the day of your procedure.

## 2022-03-04 ENCOUNTER — Ambulatory Visit: Payer: BC Managed Care – PPO | Admitting: Family Medicine

## 2022-03-04 NOTE — Progress Notes (Deleted)
   I, Peterson Lombard, LAT, ATC acting as a scribe for [name], MD.  Tonya Suarez is a 52 y.o. female who presents to [place] at [facility] today for R shoulder pain. Pt was last seen by Dr.Corey on 12/01/21 for a fx of her L 5th toe. Pt was seen previously for her R shoulder on 10/06/21 and was given a R subacromial steroid injection. Today, pt reports  Dx imaging: 03/04/21 C-spine & R shoulder XR   Pertinent review of systems: ***  Relevant historical information: ***   Exam:  LMP 06/05/2018 Comment: BTL General: Well Developed, well nourished, and in no acute distress.   MSK: ***    Lab and Radiology Results No results found for this or any previous visit (from the past 72 hour(s)). No results found.     Assessment and Plan: 52 y.o. female with ***   PDMP not reviewed this encounter. No orders of the defined types were placed in this encounter.  No orders of the defined types were placed in this encounter.    Discussed warning signs or symptoms. Please see discharge instructions. Patient expresses understanding.   ***

## 2022-03-04 NOTE — Progress Notes (Unsigned)
   I, Peterson Lombard, LAT, ATC acting as a scribe for [name], MD.  Tonya Suarez is a 52 y.o. female who presents to [place] at [facility] today for R shoulder pain. Pt was last seen by Dr.Deunte Bledsoe on 12/01/21 for a fx of her L 5th toe. Pt was seen previously for her R shoulder on 10/06/21 and was given a R subacromial steroid injection. Today, pt reports  Dx imaging: 03/04/21 C-spine & R shoulder XR    Pertinent review of systems: ***  Relevant historical information: ***   Exam:  LMP 06/05/2018 Comment: BTL General: Well Developed, well nourished, and in no acute distress.   MSK: ***    Lab and Radiology Results No results found for this or any previous visit (from the past 72 hour(s)). No results found.     Assessment and Plan: 52 y.o. female with ***   PDMP not reviewed this encounter. No orders of the defined types were placed in this encounter.  No orders of the defined types were placed in this encounter.    Discussed warning signs or symptoms. Please see discharge instructions. Patient expresses understanding.   ***

## 2022-03-05 ENCOUNTER — Other Ambulatory Visit: Payer: Self-pay | Admitting: Family Medicine

## 2022-03-05 DIAGNOSIS — I10 Essential (primary) hypertension: Secondary | ICD-10-CM

## 2022-03-07 ENCOUNTER — Ambulatory Visit: Payer: Self-pay

## 2022-03-07 ENCOUNTER — Ambulatory Visit: Payer: BC Managed Care – PPO | Admitting: Family Medicine

## 2022-03-07 VITALS — BP 96/64 | HR 78 | Ht 70.0 in | Wt 232.0 lb

## 2022-03-07 DIAGNOSIS — M25511 Pain in right shoulder: Secondary | ICD-10-CM

## 2022-03-07 DIAGNOSIS — G8929 Other chronic pain: Secondary | ICD-10-CM

## 2022-03-07 DIAGNOSIS — R0789 Other chest pain: Secondary | ICD-10-CM

## 2022-03-07 MED ORDER — TIZANIDINE HCL 4 MG PO TABS: 2.0000 mg | ORAL_TABLET | Freq: Three times a day (TID) | ORAL | 1 refills | Status: DC | PRN

## 2022-03-07 NOTE — Patient Instructions (Addendum)
Thank you for coming in today.   You received an injection today. Seek immediate medical attention if the joint becomes red, extremely painful, or is oozing fluid.   Use a heating pad and muscle relaxer mostly at bedtime if the back or shoulder blade pain continues.   Let me know if you have any problems.

## 2022-03-23 ENCOUNTER — Encounter: Payer: Self-pay | Admitting: Internal Medicine

## 2022-03-30 ENCOUNTER — Encounter: Payer: Self-pay | Admitting: Internal Medicine

## 2022-03-30 ENCOUNTER — Ambulatory Visit (AMBULATORY_SURGERY_CENTER): Payer: BC Managed Care – PPO | Admitting: Internal Medicine

## 2022-03-30 VITALS — BP 113/73 | HR 70 | Temp 96.8°F | Resp 11 | Ht 70.0 in | Wt 230.0 lb

## 2022-03-30 DIAGNOSIS — Z09 Encounter for follow-up examination after completed treatment for conditions other than malignant neoplasm: Secondary | ICD-10-CM | POA: Diagnosis not present

## 2022-03-30 DIAGNOSIS — Z1211 Encounter for screening for malignant neoplasm of colon: Secondary | ICD-10-CM | POA: Diagnosis not present

## 2022-03-30 DIAGNOSIS — D122 Benign neoplasm of ascending colon: Secondary | ICD-10-CM | POA: Diagnosis not present

## 2022-03-30 DIAGNOSIS — D123 Benign neoplasm of transverse colon: Secondary | ICD-10-CM | POA: Diagnosis not present

## 2022-03-30 DIAGNOSIS — Z860100 Personal history of colon polyps, unspecified: Secondary | ICD-10-CM

## 2022-03-30 MED ORDER — SODIUM CHLORIDE 0.9 % IV SOLN: 500.0000 mL | Freq: Once | INTRAVENOUS | Status: DC

## 2022-03-30 NOTE — Progress Notes (Signed)
Called to room to assist during endoscopic procedure.  Patient ID and intended procedure confirmed with present staff. Received instructions for my participation in the procedure from the performing physician.  

## 2022-03-30 NOTE — Op Note (Signed)
[place] Patient Name: Tonya Suarez Procedure Date: 03/30/2022 1:09 PM MRN: [id number] Endoscopist: Docia Chuck. Henrene Pastor , MD, [id number] Age: 52 Referring MD:  Date of Birth: March 22, 1970 Gender: Female Account #: [id number] Procedure:                Colonoscopy with cold snare polypectomy x 3 Indications:              High risk colon cancer surveillance: Personal                            history of non-advanced adenoma. Previous                            examination 2018. Also, father with colon cancer                            less than age 88 Medicines:                Monitored Anesthesia Care Procedure:                Pre-Anesthesia Assessment:                           - Prior to the procedure, a History and Physical                            was performed, and patient medications and                            allergies were reviewed. The patient's tolerance of                            previous anesthesia was also reviewed. The risks                            and benefits of the procedure and the sedation                            options and risks were discussed with the patient.                            All questions were answered, and informed consent                            was obtained. Prior Anticoagulants: The patient has                            taken no anticoagulant or antiplatelet agents.                            After reviewing the risks and benefits, the patient                            was deemed in satisfactory condition to undergo the  procedure.                           After obtaining informed consent, the colonoscope                            was passed under direct vision. Throughout the                            procedure, the patient's blood pressure, pulse, and                            oxygen saturations were monitored continuously. The                            Olympus CF-HQ190L ([id number])  Colonoscope was                            introduced through the anus and advanced to the the                            cecum, identified by appendiceal orifice and                            ileocecal valve. The ileocecal valve, appendiceal                            orifice, and rectum were photographed. The quality                            of the bowel preparation was excellent. The                            colonoscopy was performed without difficulty. The                            patient tolerated the procedure well. The bowel                            preparation used was SUPREP via split dose                            instruction. Scope In: 1:44:33 PM Scope Out: 2:01:16 PM Scope Withdrawal Time: 0 hours 13 minutes 33 seconds  Total Procedure Duration: 0 hours 16 minutes 43 seconds  Findings:                 Three polyps were found in the transverse colon and                            ascending colon. The polyps were 4 to 6 mm in size.                            These polyps were removed with a cold snare.  Resection and retrieval were complete.                           A few small-mouthed diverticula were found in the                            sigmoid colon.                           Internal hemorrhoids were found during retroflexion.                           The exam was otherwise without abnormality on                            direct and retroflexion views. Complications:            No immediate complications. Estimated blood loss:                            None. Estimated Blood Loss:     Estimated blood loss: none. Impression:               - Three 4 to 6 mm polyps in the transverse colon                            and in the ascending colon, removed with a cold                            snare. Resected and retrieved.                           - Diverticulosis in the sigmoid colon.                           - Internal hemorrhoids.                            - The examination was otherwise normal on direct                            and retroflexion views. Recommendation:           - Repeat colonoscopy in 5 years for surveillance.                           - Patient has a contact number available for                            emergencies. The signs and symptoms of potential                            delayed complications were discussed with the                            patient. Return to normal activities tomorrow.  Written discharge instructions were provided to the                            patient.                           - Resume previous diet.                           - Continue present medications.                           - Await pathology results. Docia Chuck. Henrene Pastor, MD 03/30/2022 2:14:22 PM This report has been signed electronically.

## 2022-03-30 NOTE — Progress Notes (Signed)
Vss nad trans to pacu °

## 2022-03-30 NOTE — Patient Instructions (Addendum)
Handouts on hemorrhoids, diverticulosis, and polyps given to patient. Await pathology results. Resume previous diet and continue present medications. Repeat colonoscopy for surveillance in 5 years!   YOU HAD AN ENDOSCOPIC PROCEDURE TODAY AT [facility]:   Refer to the procedure report that was given to you for any specific questions about what was found during the examination.  If the procedure report does not answer your questions, please call your gastroenterologist to clarify.  If you requested that your care partner not be given the details of your procedure findings, then the procedure report has been included in a sealed envelope for you to review at your convenience later.  YOU SHOULD EXPECT: Some feelings of bloating in the abdomen. Passage of more gas than usual.  Walking can help get rid of the air that was put into your GI tract during the procedure and reduce the bloating. If you had a lower endoscopy (such as a colonoscopy or flexible sigmoidoscopy) you may notice spotting of blood in your stool or on the toilet paper. If you underwent a bowel prep for your procedure, you may not have a normal bowel movement for a few days.  Please Note:  You might notice some irritation and congestion in your nose or some drainage.  This is from the oxygen used during your procedure.  There is no need for concern and it should clear up in a day or so.  SYMPTOMS TO REPORT IMMEDIATELY:  Following lower endoscopy (colonoscopy or flexible sigmoidoscopy):  Excessive amounts of blood in the stool  Significant tenderness or worsening of abdominal pains  Swelling of the abdomen that is new, acute  Fever of 100F or higher  For urgent or emergent issues, a gastroenterologist can be reached at any hour by calling [phone]. Do not use MyChart messaging for urgent concerns.    DIET:  We do recommend a small meal at first, but then you may proceed to your regular diet.  Drink plenty of  fluids but you should avoid alcoholic beverages for 24 hours.  ACTIVITY:  You should plan to take it easy for the rest of today and you should NOT DRIVE or use heavy machinery until tomorrow (because of the sedation medicines used during the test).    FOLLOW UP: Our staff will call the number listed on your records the next business day following your procedure.  We will call around 7:15- 8:00 am to check on you and address any questions or concerns that you may have regarding the information given to you following your procedure. If we do not reach you, we will leave a message.     If any biopsies were taken you will be contacted by phone or by letter within the next 1-3 weeks.  Please call us at [phone] if you have not heard about the biopsies in 3 weeks.    SIGNATURES/CONFIDENTIALITY: You and/or your care partner have signed paperwork which will be entered into your electronic medical record.  These signatures attest to the fact that that the information above on your After Visit Summary has been reviewed and is understood.  Full responsibility of the confidentiality of this discharge information lies with you and/or your care-partner.

## 2022-03-30 NOTE — Progress Notes (Signed)
HISTORY OF PRESENT ILLNESS:   Tonya Suarez is a 52 y.o. 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  Also, complaints of new onset mid upper back pain.   Patient was last seen in the office in March 2021.  At that time she was being treated for GERD that required chronic PPI symptoms.  Prior endoscopy normal.  Other issues were obesity, chronic constipation, and a family history of colon cancer as well as a personal history of adenomatous colon polyps.  The patient underwent previous colonoscopy and upper endoscopy September 16, 2016.  There is a history of colon cancer in her father less than age 7.  Colonoscopy revealed diminutive tubular adenoma and internal hemorrhoids.  Follow-up in 5 years for surveillance.  Upper endoscopy, as mentioned before, was normal.   Patient tells me that she continues on pantoprazole 40 mg daily for her GERD.  She will have occasional breakthrough symptoms with dietary discretion.  On demand as acids are used.  She is on Ozempic in hopes of weight loss.  He denies dysphagia.     Next, She states she had severe issues with coughing about 2 weeks ago.  Since that time she has had pain in her mid back between the shoulder blades.  This is exacerbated with direct touch or movements.  GI review of systems is otherwise negative.   REVIEW OF SYSTEMS:   All non-GI ROS negative.     Past Medical History:  Diagnosis Date   Abnormal Pap smear of cervix      ascus hpv hr neg   Allergy     Arthritis     Chronic headaches     Depression     Diabetes mellitus without complication (HCC)     GERD (gastroesophageal reflux disease)     Hypertension     Low back pain     UTI (urinary tract infection)             Past Surgical History:  Procedure Laterality Date   CERVICAL BIOPSY  W/ LOOP ELECTRODE EXCISION       COLPOSCOPY        CIN2/3   TUBAL LIGATION           Social History Joanna Borawski  reports that she has quit smoking. Her smoking use included cigars. She has never used smokeless tobacco. She reports current alcohol use. She reports that she does not use drugs.   family history includes Cancer - Other in her sister; Colon cancer in her father, maternal aunt, and paternal grandmother; Colon polyps in her father and mother; Diabetes in her father and mother; Hyperlipidemia in her father and mother; Hypertension in her father and mother; Kidney disease in her mother; Lung cancer in her father; Stomach cancer in her sister.        Allergies  Allergen Reactions   Amoxicillin Swelling      Lip swelling          PHYSICAL EXAMINATION: Vital signs: BP 118/68   Pulse 64   Ht '5\' 10"'$  (1.778 m)   Wt 230 lb (104.3 kg)   LMP 06/05/2018 Comment: BTL  BMI 33.00 kg/m   Constitutional: generally well-appearing, no acute distress Psychiatric: alert and oriented x3, cooperative Eyes: extraocular movements intact, anicteric, conjunctiva pink Mouth: oral pharynx moist, no lesions Neck: supple no lymphadenopathy Cardiovascular: heart regular rate and rhythm, no murmur Lungs: clear to auscultation  bilaterally Back: Muscular tenderness with palpation between shoulder blades Abdomen: soft, nontender, nondistended, no obvious ascites, no peritoneal signs, normal bowel sounds, no organomegaly Rectal: Deferred until colonoscopy Extremities: no clubbing, cyanosis, or lower extremity edema bilaterally Skin: no lesions on visible extremities Neuro: No focal deficits.  Cranial nerves intact   ASSESSMENT:   1.  Personal history of diminutive adenoma 2018 2.  Family history of colon cancer in father less than age 61 3.  Chronic GERD.  Normal prior endoscopy.  Some breakthrough symptoms despite once daily pantoprazole.  On-demand antacids helpful. 4.  Obesity.  Ongoing.  On Ozempic.  Being followed by PCP 5.  Musculoskeletal back pain   PLAN:   1.   Reflux precautions 2.  Weight loss 3.  Continue pantoprazole 40 mg daily.  Prescription refilled.  Medication list reviewed 4.  Schedule surveillance colonoscopy.The nature of the procedure, as well as the risks, benefits, and alternatives were carefully and thoroughly reviewed with the patient. Ample time for discussion and questions allowed. The patient understood, was satisfied, and agreed to proceed. 5.  Adjust diabetic medications preprocedure to avoid unwanted hypoglycemia 6.  Reassurance regarding back pain.  Analgesics as needed.

## 2022-03-31 ENCOUNTER — Telehealth: Payer: Self-pay

## 2022-03-31 NOTE — Telephone Encounter (Signed)
  Follow up Call-     03/30/2022    1:23 PM  Call back number  Post procedure Call Back phone  # [phone]  Permission to leave phone message Yes     Patient questions:  Do you have a fever, pain , or abdominal swelling? No. Pain Score  0 *  Have you tolerated food without any problems? Yes.    Have you been able to return to your normal activities? Yes.    Do you have any questions about your discharge instructions: Diet   No. Medications  No. Follow up visit  No.  Do you have questions or concerns about your Care? No.  Actions: * If pain score is 4 or above: No action needed, pain <4.

## 2022-04-01 ENCOUNTER — Emergency Department (HOSPITAL_COMMUNITY)
Admission: EM | Admit: 2022-04-01 | Discharge: 2022-04-01 | Disposition: A | Discharge status: Home / Self Care | Payer: BC Managed Care – PPO | Attending: Emergency Medicine | Admitting: Emergency Medicine

## 2022-04-01 ENCOUNTER — Emergency Department (HOSPITAL_COMMUNITY): Payer: BC Managed Care – PPO

## 2022-04-01 DIAGNOSIS — R55 Syncope and collapse: Secondary | ICD-10-CM | POA: Diagnosis not present

## 2022-04-01 DIAGNOSIS — E876 Hypokalemia: Secondary | ICD-10-CM | POA: Diagnosis not present

## 2022-04-01 DIAGNOSIS — R4781 Slurred speech: Secondary | ICD-10-CM | POA: Insufficient documentation

## 2022-04-01 DIAGNOSIS — I10 Essential (primary) hypertension: Secondary | ICD-10-CM | POA: Diagnosis not present

## 2022-04-01 DIAGNOSIS — R4701 Aphasia: Secondary | ICD-10-CM | POA: Diagnosis not present

## 2022-04-01 DIAGNOSIS — R404 Transient alteration of awareness: Secondary | ICD-10-CM | POA: Diagnosis not present

## 2022-04-01 DIAGNOSIS — R2990 Unspecified symptoms and signs involving the nervous system: Secondary | ICD-10-CM | POA: Diagnosis not present

## 2022-04-01 LAB — I-STAT CHEM 8, ED
BUN: 12 mg/dL (ref 6–20)
Calcium, Ion: 1.2 mmol/L (ref 1.15–1.40)
Chloride: 105 mmol/L (ref 98–111)
Creatinine, Ser: 0.8 mg/dL (ref 0.44–1.00)
Glucose, Bld: 92 mg/dL (ref 70–99)
HCT: 45 % (ref 36.0–46.0)
Hemoglobin: 15.3 g/dL — ABNORMAL HIGH (ref 12.0–15.0)
Potassium: 3.1 mmol/L — ABNORMAL LOW (ref 3.5–5.1)
Sodium: 142 mmol/L (ref 135–145)
TCO2: 24 mmol/L (ref 22–32)

## 2022-04-01 LAB — APTT: aPTT: 26 seconds (ref 24–36)

## 2022-04-01 LAB — COMPREHENSIVE METABOLIC PANEL
ALT: 25 U/L (ref 0–44)
AST: 27 U/L (ref 15–41)
Albumin: 3.9 g/dL (ref 3.5–5.0)
Alkaline Phosphatase: 72 U/L (ref 38–126)
Anion gap: 12 (ref 5–15)
BUN: 10 mg/dL (ref 6–20)
CO2: 22 mmol/L (ref 22–32)
Calcium: 9.4 mg/dL (ref 8.9–10.3)
Chloride: 105 mmol/L (ref 98–111)
Creatinine, Ser: 0.97 mg/dL (ref 0.44–1.00)
GFR, Estimated: >60 mL/min (ref >60)
Glucose, Bld: 95 mg/dL (ref 70–99)
Potassium: 3 mmol/L — ABNORMAL LOW (ref 3.5–5.1)
Sodium: 139 mmol/L (ref 135–145)
Total Bilirubin: 1.1 mg/dL (ref 0.3–1.2)
Total Protein: 6.5 g/dL (ref 6.5–8.1)

## 2022-04-01 LAB — CBC
HCT: 43.1 % (ref 36.0–46.0)
Hemoglobin: 16.1 g/dL — ABNORMAL HIGH (ref 12.0–15.0)
MCH: 32.3 pg (ref 26.0–34.0)
MCHC: 37.4 g/dL — ABNORMAL HIGH (ref 30.0–36.0)
MCV: 86.4 fL (ref 80.0–100.0)
Platelets: 215 10*3/uL (ref 150–400)
RBC: 4.99 MIL/uL (ref 3.87–5.11)
RDW: 13.4 % (ref 11.5–15.5)
WBC: 11.2 10*3/uL — ABNORMAL HIGH (ref 4.0–10.5)
nRBC: 0 % (ref 0.0–0.2)

## 2022-04-01 LAB — PROTIME-INR
INR: 1 (ref 0.8–1.2)
Prothrombin Time: 12.8 seconds (ref 11.4–15.2)

## 2022-04-01 LAB — DIFFERENTIAL
Abs Immature Granulocytes: 0.02 10*3/uL (ref 0.00–0.07)
Basophils Absolute: 0.1 10*3/uL (ref 0.0–0.1)
Basophils Relative: 1 %
Eosinophils Absolute: 0.3 10*3/uL (ref 0.0–0.5)
Eosinophils Relative: 3 %
Immature Granulocytes: 0 %
Lymphocytes Relative: 41 %
Lymphs Abs: 4.5 10*3/uL — ABNORMAL HIGH (ref 0.7–4.0)
Monocytes Absolute: 0.6 10*3/uL (ref 0.1–1.0)
Monocytes Relative: 5 %
Neutro Abs: 5.7 10*3/uL (ref 1.7–7.7)
Neutrophils Relative %: 50 %

## 2022-04-01 LAB — ETHANOL: Alcohol, Ethyl (B): <10 mg/dL (ref <10)

## 2022-04-01 LAB — I-STAT BETA HCG BLOOD, ED (MC, WL, AP ONLY): I-stat hCG, quantitative: 6.1 m[IU]/mL — ABNORMAL HIGH (ref <5)

## 2022-04-01 LAB — CBG MONITORING, ED: Glucose-Capillary: 97 mg/dL (ref 70–99)

## 2022-04-01 MED ORDER — IOHEXOL 350 MG/ML SOLN
75.0000 mL | Freq: Once | INTRAVENOUS | Status: AC | PRN
  Administered 2022-04-01: 75 mL via INTRAVENOUS

## 2022-04-01 NOTE — Consult Note (Signed)
Neurology Consultation  Reason for Consult: Code stroke slurred speech Referring Physician: Dr. Deno Etienne  CC: Code stroke from the field for slurred speech  History is obtained from: Chart, patient's daughter over the phone  HPI: Tonya Suarez is a 52 y.o. female past medical history of diabetes, hypertension, depression, presenting to the emergency room for evaluation of sudden onset of wakeful unresponsiveness. Patient works third shift, but home at some point earlier this morning, at around 6 AM took her mother for dialysis and came home and slept.  At around 11:27 AM, she called her daughter who felt that her speech was slurred.  Last known well is somewhere after 6 AM when she dropped her mother and went to sleep.  Presumably around 6:30 AM. The daughter felt that something was not right with her, came to see her, noted her sugar to be 60 and blood pressure systolic 85 with a normal heart rate.  EMS was called because of slurred speech.  For EMS, she did not have any verbal output.  She had her eyes open staring in space without any response to voice or noxious stimulation. Code stroke was activated due to family having witnessed slurred speech, earlier presumed last known well around 11 AM but it was way earlier, closer to the 6 cm mark then 11 AM mark.    LKW: 6:30 AM IV thrombolysis given?: no, outside the window, presentation less likely stroke related Premorbid modified Rankin scale (mRS): 0  ROS: Full ROS was performed and is negative except as noted in the HPI.  Past Medical History:  Diagnosis Date   Abnormal Pap smear of cervix    ascus hpv hr neg   Allergy    Arthritis    Chronic headaches    Depression    Diabetes mellitus without complication (HCC)    GERD (gastroesophageal reflux disease)    Hypertension    Low back pain    UTI (urinary tract infection)      Family History  Problem Relation Age of Onset   Colon polyps Mother    Diabetes Mother     Hypertension Mother    Hyperlipidemia Mother    Kidney disease Mother        has fistula- could need dialysis at any time   Colon polyps Father    Diabetes Father        mother   Hyperlipidemia Father        mother   Hypertension Father        mother   Colon cancer Father        54s, and grandmother   Lung cancer Father        in 74s   Cancer - Other Sister        duodenal   Stomach cancer Sister    Colon cancer Maternal Aunt    Colon cancer Paternal Grandmother    Esophageal cancer Neg Hx    Liver cancer Neg Hx    Pancreatic cancer Neg Hx    Rectal cancer Neg Hx      Social History:   reports that she has quit smoking. Her smoking use included cigars. She has never used smokeless tobacco. She reports current alcohol use. She reports that she does not use drugs.  Medications No current facility-administered medications for this encounter.  Current Outpatient Medications:    amLODipine (NORVASC) 2.5 MG tablet, TAKE 1 TABLET BY MOUTH EVERY DAY, Disp: 90 tablet, Rfl: 3   azelastine (  ASTELIN) 0.1 % nasal spray, Place 2 sprays into both nostrils 2 (two) times daily., Disp: 90 mL, Rfl: 3   clotrimazole-betamethasone (LOTRISONE) cream, Apply 1 application topically 2 (two) times daily., Disp: 45 g, Rfl: 1   Continuous Blood Gluc Sensor (FREESTYLE LIBRE 3 SENSOR) MISC, Quantity: 2 sensors/month NDC# [phone] Sensor Refills: PRN or 12 refills annually, Disp: 2 each, Rfl: 12   diclofenac Sodium (VOLTAREN) 1 % GEL, Apply 4 g topically 4 (four) times daily. To affected joint., Disp: 100 g, Rfl: 11   fluticasone (FLONASE) 50 MCG/ACT nasal spray, SPRAY 2 SPRAYS INTO EACH NOSTRIL EVERY DAY, Disp: 48 mL, Rfl: 3   gabapentin (NEURONTIN) 300 MG capsule, TAKE 2 CAPSULES BY MOUTH AT BEDTIME, Disp: 180 capsule, Rfl: 1   hydrochlorothiazide (HYDRODIURIL) 25 MG tablet, TAKE 1 TABLET (25 MG TOTAL) BY MOUTH DAILY., Disp: 90 tablet, Rfl: 3   meloxicam (MOBIC) 7.5 MG tablet, TAKE 1 TABLET (7.5 MG  TOTAL) BY MOUTH DAILY AS NEEDED. FOR PAIN, Disp: 90 tablet, Rfl: 1   OZEMPIC, 0.25 OR 0.5 MG/DOSE, 2 MG/3ML SOPN, INJECT 0.25 MG AS DIRECTED ONCE A WEEK FOR 28 DAYS, THEN 0.5 MG ONCE A WEEK FOR 28 DAYS. PATIENT DID NOT TOLERATE METFORMIN- WE CAN COMPLETE PA IF NEEDED.., Disp: 3 mL, Rfl: 5   pantoprazole (PROTONIX) 40 MG tablet, Take 1 tablet (40 mg total) by mouth daily., Disp: 90 tablet, Rfl: 3   rosuvastatin (CRESTOR) 10 MG tablet, Take 1 tablet (10 mg total) by mouth once a week., Disp: 13 tablet, Rfl: 3   tiZANidine (ZANAFLEX) 4 MG tablet, Take 0.5-1 tablets (2-4 mg total) by mouth every 8 (eight) hours as needed for muscle spasms., Disp: 30 tablet, Rfl: 1   Exam: Current vital signs: BP 117/86 (BP Location: Right Arm)   Pulse 65   Resp 15   Ht 5' 10"$  (1.778 m)   Wt (!) 190 kg   LMP 06/05/2018 Comment: BTL  SpO2 96%   BMI 60.10 kg/m  Vital signs in last 24 hours: Pulse Rate:  [64-65] 65 (02/09 1240) Resp:  [15] 15 (02/09 1240) BP: (117-126)/(86-88) 117/86 (02/09 1240) SpO2:  [96 %-99 %] 96 % (02/09 1240) Weight:  [104.4 kg-190 kg] 190 kg (02/09 1233) General: Patient is awake, eyes open in no distress HEENT: Normocephalic atraumatic Lungs: Clear Cardiovascular: Regular rate rhythm Abdomen nondistended nontender and obese Neurological exam Patient is awake Her eyes are open She does not respond to voice She does not respond to noxious stimulation Cranial nerves: Staring straight ahead, pupils equal round reactive to light, difficult to elicit extraocular movements, corneal reflexes present bilaterally, face appears grossly symmetric. Motor examination with no spontaneous movement.  No movement to noxious simulation Sensation: No withdrawal to noxious stimulation.  No grimace to noxious stimulation Coordination cannot be tested as she does not follow commands NIHSS 1a Level of Conscious.: 0 1b LOC Questions: 2 1c LOC Commands: 2 2 Best Gaze: 0 3 Visual: 0 4 Facial Palsy:  0 5a Motor Arm - left: 4 5b Motor Arm - Right: 4 6a Motor Leg - Left: 4 6b Motor Leg - Right: 4 7 Limb Ataxia: 0 8 Sensory: 2 9 Best Language: 3 10 Dysarthria: 2 11 Extinct. and Inatten.: 0 TOTAL: 27   Labs I have reviewed labs in epic and the results pertinent to this consultation are: CBC    Component Value Date/Time   WBC 9.6 05/11/2021 0845   RBC 5.05 05/11/2021 0845   HGB 15.3 (  H) 04/01/2022 1234   HCT 45.0 04/01/2022 1234   PLT 218.0 05/11/2021 0845   MCV 89.8 05/11/2021 0845   MCH 30.2 08/14/2015 1154   MCHC 34.6 05/11/2021 0845   RDW 14.3 05/11/2021 0845   LYMPHSABS 3.6 05/11/2021 0845   MONOABS 0.4 05/11/2021 0845   EOSABS 0.2 05/11/2021 0845   BASOSABS 0.0 05/11/2021 0845    CMP     Component Value Date/Time   NA 142 04/01/2022 1234   K 3.1 (L) 04/01/2022 1234   CL 105 04/01/2022 1234   CO2 26 12/09/2021 1638   GLUCOSE 92 04/01/2022 1234   BUN 12 04/01/2022 1234   CREATININE 0.80 04/01/2022 1234   CALCIUM 9.8 12/09/2021 1638   PROT 6.8 12/09/2021 1638   ALBUMIN 4.2 12/09/2021 1638   AST 25 12/09/2021 1638   ALT 22 12/09/2021 1638   ALKPHOS 63 12/09/2021 1638   BILITOT 0.8 12/09/2021 1638   GFRNONAA >60 08/14/2015 1154   GFRAA >60 08/14/2015 1154   Imaging I have reviewed the images obtained:  CT-head: No acute changes.  Aspects 10 CT angiography head and neck stat: No emergent LVO.  Assessment:  52 year old with above past medical history presenting for sudden onset of unresponsiveness, has her eyes open but does not respond to voice or noxious simulation. Concern for possible posterior circulation stroke/brainstem involvement for which stat CT angiography head and neck was done which was negative for acute process or occlusion. Noncontrasted head CT showed no evidence of bleed. Her clinical presentation appears to be consistent with a possible catatonic state.  Given her risk factors, I will do an MRI to rule out any structural brain  abnormality.  Impression Strokelike symptoms-examination concerning for conversion disorder MRI to rule out stroke   Addendum MRI brain completed.  Personally reviewed.  No evidence of stroke. Right after the CT scan, when she was brought into the room, she also started speaking and started raising her extremities up with effort dependent weakness. I suspect an underlying psychogenic etiology versus conversion disorder. There is no need for further inpatient workup at this time. Further management per the emergency department Plan was relayed to Dr. Tyrone Nine.   -- Amie Portland, MD Neurologist [facility] Pager: [phone]

## 2022-04-01 NOTE — Code Documentation (Signed)
Stroke Response Nurse Documentation Code Documentation  Tonya Suarez is a 52 y.o. female arriving to [facility]  via [facility] on 04/01/22 with past medical hx of HTN, MDD, HLD, DM, smoking. On No antithrombotic. Code stroke was activated by EMS.   Patient from home where she was LKW at 0600 and now complaining of aphasia, inability to move, though awake. 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 No one saw her again until after 1130, at which time she was talking to family on the phone and had slurred speech. The daughter went to the house and checked her blood sugar (60) and blood pressure (85 systolic) and found her to be totally aphasic and unable to move any extremity.    Stroke team at the bedside on patient arrival. Labs drawn and patient cleared for CT by Dr. Tyrone Nine. Patient to CT with team. NIHSS 28, see documentation for details and code stroke times. Patient with disoriented, not following commands, bilateral hemianopia, bilateral arm weakness, bilateral leg weakness, bilateral decreased sensation, and Expressive aphasia  on exam. Patient's symptoms changed while in CT and all symptoms resolved except for mild tingling on the L side, NIHSS 1. The following imaging was completed:  CT Head and CTA. Patient is not a candidate for IV Thrombolytic due to outside of window. Patient is not not a candidate for IR due to no LVO on imaging per MD.   Care Plan: q2hr neuro checks.   Bedside handoff with ED RN Katrina.    [place]  Stroke Response RN

## 2022-04-01 NOTE — ED Triage Notes (Signed)
Code stroke. LKW at [place] in by EMS for slurred speech at 1130 and now has become nonverbal. Pt opens her eyes and can answer questions by blinking. Unable to move all four extremities.

## 2022-04-01 NOTE — Discharge Instructions (Addendum)
Follow up with your doctor in the office.

## 2022-04-01 NOTE — ED Notes (Signed)
Pt was able to ambulate to the bathroom and back to room independently. Able to move all four extremities. Alert and oriented x 4 and at baseline. Able to answer questions and follow commands. Neuro cleared pt for discharge. Ambulated with family to the car.

## 2022-04-01 NOTE — ED Provider Notes (Signed)
[facility] Provider Note   CSN: [id number] Arrival date & time: 04/01/22  1223  An emergency department physician performed an initial assessment on this suspected stroke patient at 86.  History  Chief Complaint  Patient presents with   Code Stroke    Tonya Suarez is a 52 y.o. female.  52 yo F with a chief complaints of slurred speech and then inability to speak or move.  This occurred just prior to arrival and the patient arrived as a code stroke.  Level 5 caveat acuity of condition.        Home Medications Prior to Admission medications   Medication Sig Start Date End Date Taking? Authorizing Provider  amLODipine (NORVASC) 2.5 MG tablet TAKE 1 TABLET BY MOUTH EVERY DAY 03/07/22   Marin Olp, MD  azelastine (ASTELIN) 0.1 % nasal spray Place 2 sprays into both nostrils 2 (two) times daily. 05/11/21   Marin Olp, MD  clotrimazole-betamethasone (LOTRISONE) cream Apply 1 application topically 2 (two) times daily. 03/08/21   Edrick Kins, DPM  Continuous Blood Gluc Sensor (FREESTYLE LIBRE 3 SENSOR) MISC Quantity: 2 sensors/month NDC# [phone] Sensor Refills: PRN or 12 refills annually 09/02/21   Marin Olp, MD  diclofenac Sodium (VOLTAREN) 1 % GEL Apply 4 g topically 4 (four) times daily. To affected joint. 11/28/19   Gregor Hams, MD  fluticasone [facility]) 50 MCG/ACT nasal spray SPRAY 2 SPRAYS INTO EACH NOSTRIL EVERY DAY 05/11/21   Marin Olp, MD  gabapentin (NEURONTIN) 300 MG capsule TAKE 2 CAPSULES BY MOUTH AT BEDTIME 10/29/21   Gregor Hams, MD  hydrochlorothiazide (HYDRODIURIL) 25 MG tablet TAKE 1 TABLET (25 MG TOTAL) BY MOUTH DAILY. 03/07/22   Marin Olp, MD  meloxicam (MOBIC) 7.5 MG tablet TAKE 1 TABLET (7.5 MG TOTAL) BY MOUTH DAILY AS NEEDED. FOR PAIN 11/05/21   Marin Olp, MD  OZEMPIC, 0.25 OR 0.5 MG/DOSE, 2 MG/3ML SOPN INJECT 0.25 MG AS DIRECTED ONCE A WEEK FOR 28 DAYS, THEN 0.5  MG ONCE A WEEK FOR 28 DAYS. PATIENT DID NOT TOLERATE METFORMIN- WE CAN COMPLETE PA IF NEEDED.. 12/06/21   Marin Olp, MD  pantoprazole (PROTONIX) 40 MG tablet Take 1 tablet (40 mg total) by mouth daily. 03/03/22   Irene Shipper, MD  rosuvastatin (CRESTOR) 10 MG tablet Take 1 tablet (10 mg total) by mouth once a week. 12/09/21   Marin Olp, MD  tiZANidine (ZANAFLEX) 4 MG tablet Take 0.5-1 tablets (2-4 mg total) by mouth every 8 (eight) hours as needed for muscle spasms. 03/07/22   Gregor Hams, MD      Allergies    Amoxicillin    Review of Systems   Review of Systems  Physical Exam Updated Vital Signs BP (!) 123/94   Pulse 96   Temp (!) 96 F (35.6 C) (Axillary)   Resp 15   Ht 5' 10"$  (1.778 m)   Wt (!) 190 kg   LMP 06/05/2018 Comment: BTL  SpO2 99%   BMI 60.10 kg/m  Physical Exam Vitals and nursing note reviewed.  Constitutional:      General: She is not in acute distress.    Appearance: She is well-developed. She is not diaphoretic.  HENT:     Head: Normocephalic and atraumatic.  Eyes:     Pupils: Pupils are equal, round, and reactive to light.  Cardiovascular:     Rate and Rhythm: Normal rate and  regular rhythm.     Heart sounds: No murmur heard.    No friction rub. No gallop.  Pulmonary:     Effort: Pulmonary effort is normal.     Breath sounds: No wheezing or rales.  Abdominal:     General: There is no distension.     Palpations: Abdomen is soft.     Tenderness: There is no abdominal tenderness.  Musculoskeletal:        General: No tenderness.     Cervical back: Normal range of motion and neck supple.  Skin:    General: Skin is warm and dry.  Neurological:     Mental Status: She is alert.     ED Results / Procedures / Treatments   Labs (all labs ordered are listed, but only abnormal results are displayed) Labs Reviewed  CBC - Abnormal; Notable for the following components:      Result Value   WBC 11.2 (*)    Hemoglobin 16.1 (*)    MCHC  37.4 (*)    All other components within normal limits  DIFFERENTIAL - Abnormal; Notable for the following components:   Lymphs Abs 4.5 (*)    All other components within normal limits  COMPREHENSIVE METABOLIC PANEL - Abnormal; Notable for the following components:   Potassium 3.0 (*)    All other components within normal limits  I-STAT CHEM 8, ED - Abnormal; Notable for the following components:   Potassium 3.1 (*)    Hemoglobin 15.3 (*)    All other components within normal limits  I-STAT BETA HCG BLOOD, ED ([facility], [facility], [facility] ONLY) - Abnormal; Notable for the following components:   I-stat hCG, quantitative 6.1 (*)    All other components within normal limits  ETHANOL  PROTIME-INR  APTT  RAPID URINE DRUG SCREEN, HOSP PERFORMED  URINALYSIS, ROUTINE W REFLEX MICROSCOPIC  CBG MONITORING, ED    EKG EKG Interpretation  Date/Time:  Friday April 01 2022 13:02:06 EST Ventricular Rate:  82 PR Interval:  52 QRS Duration: 110 QT Interval:  409 QTC Calculation: 475 R Axis:   -50 Text Interpretation: Sinus rhythm Short PR interval Biatrial enlargement LAD, consider left anterior fascicular block Abnormal R-wave progression, late transition No significant change since last tracing Confirmed by Deno Etienne [phone]) on 04/01/2022 1:03:29 PM  Radiology MR BRAIN WO CONTRAST  Result Date: 04/01/2022 CLINICAL DATA:  Neuro deficit, acute, stroke suspected.  Aphasia. EXAM: MRI HEAD WITHOUT CONTRAST TECHNIQUE: Multiplanar, multiecho pulse sequences of the brain and surrounding structures were obtained without intravenous contrast. COMPARISON:  Head CT and CTA 04/01/2022 FINDINGS: Brain: There is no evidence of an acute infarct, intracranial hemorrhage, mass, midline shift, or extra-axial fluid collection. The ventricles and sulci are normal. A partially empty sella is noted. There is no significant cerebellar tonsillar ectopia. Vascular: Major intracranial vascular flow voids are preserved. Skull and upper  cervical spine: Unremarkable bone marrow signal. Sinuses/Orbits: Unremarkable orbits. Mucous retention cyst in the right sphenoid sinus. Trace right mastoid fluid. Other: None. IMPRESSION: 1. No acute intracranial abnormality. 2. Partially empty sella, often an incidental finding though can be seen with idiopathic intracranial hypertension. Electronically Signed   By: Logan Bores M.D.   On: 04/01/2022 14:21   CT ANGIO HEAD NECK W WO CM (CODE STROKE)  Result Date: 04/01/2022 CLINICAL DATA:  Head CT 04/01/2022. EXAM: CT HEAD WITHOUT CONTRAST CT ANGIOGRAPHY OF THE HEAD AND NECK TECHNIQUE: Contiguous axial images were obtained from the base of the skull through the vertex  without intravenous contrast. Multidetector CT imaging of the head and neck was performed using the standard protocol during bolus administration of intravenous contrast. Multiplanar CT image reconstructions and MIPs were obtained to evaluate the vascular anatomy. Carotid stenosis measurements (when applicable) are obtained utilizing NASCET criteria, using the distal internal carotid diameter as the denominator. RADIATION DOSE REDUCTION: This exam was performed according to the departmental dose-optimization program which includes automated exposure control, adjustment of the mA and/or kV according to patient size and/or use of iterative reconstruction technique. CONTRAST:  32m OMNIPAQUE IOHEXOL 350 MG/ML SOLN COMPARISON:  Same-day head CT. FINDINGS: CT HEAD Brain: No acute hemorrhage, mass effect or midline shift. Gray-white differentiation is preserved. No hydrocephalus. No extra-axial collection. Basilar cisterns are patent. Vascular: No hyperdense vessel or unexpected calcification. Skull: No calvarial fracture or suspicious bone lesion. Skull base is unremarkable. Sinuses/Orbits: Unremarkable. CTA NECK Aortic arch: Two-vessel arch configuration with common origin of the right brachiocephalic and left common carotid arteries. Arch vessel  origins are patent. Right carotid system: Normal common carotid. The cervical ICA is patent to the skull base without stenosis or dissection. Left carotid system: Normal common carotid. The cervical ICA is patent to the skull base without stenosis or dissection. Vertebral arteries:Patent from the origin to the confluence with the basilar without stenosis or dissection. Skeleton: Mild degenerative changes of the cervical spine without high-grade spinal canal stenosis. No suspicious bone lesions. Other neck: Unremarkable. CTA HEAD Anterior circulation: Intracranial ICAs are patent without stenosis or aneurysm. The proximal ACAs and MCAs are patent without stenosis or aneurysm. Distal branches are symmetric. Posterior circulation: Normal basilar artery. The SCAs, AICAs and PICAs are patent proximally. The PCAs are patent proximally without stenosis or aneurysm. Distal branches are symmetric. Venous sinuses: Patent within limits of early phase of contrast. Anatomic variants: Dominant right posterior communicating artery with hypoplastic right P1 segment. IMPRESSION: 1. No acute intracranial abnormality. 2. No large vessel occlusion, stenosis, dissection, or aneurysm. Electronically Signed   By: WEmmit AlexandersM.D.   On: 04/01/2022 13:28   CT HEAD CODE STROKE WO CONTRAST  Result Date: 04/01/2022 CLINICAL DATA:  Code stroke. Neuro deficit, acute, stroke suspected. Nonverbal with slurred speech. EXAM: CT HEAD WITHOUT CONTRAST TECHNIQUE: Contiguous axial images were obtained from the base of the skull through the vertex without intravenous contrast. RADIATION DOSE REDUCTION: This exam was performed according to the departmental dose-optimization program which includes automated exposure control, adjustment of the mA and/or kV according to patient size and/or use of iterative reconstruction technique. COMPARISON:  Head CT 08/14/2015. FINDINGS: Brain: No acute hemorrhage, mass effect or midline shift. Gray-white  differentiation is preserved. No hydrocephalus. No extra-axial collection. Basilar cisterns are patent. Vascular: No hyperdense vessel or unexpected calcification. Skull: No calvarial fracture or suspicious bone lesion. Skull base is unremarkable. Sinuses/Orbits: Mucous retention cyst in the right sphenoid sinus. Other: None. ASPECTS (AVeronaStroke Program Early CT Score) - Ganglionic level infarction (caudate, lentiform nuclei, internal capsule, insula, M1-M3 cortex): 7 - Supraganglionic infarction (M4-M6 cortex): 3 Total score (0-10 with 10 being normal): 10 IMPRESSION: No acute intracranial hemorrhage or evidence of large vessel territory infarction. ASPECT score is 10. Code stroke imaging results were communicated on 04/01/2022 at 12:39 pm to provider Dr. ARory Percyvia secure text paging. Electronically Signed   By: WEmmit AlexandersM.D.   On: 04/01/2022 12:40    Procedures Procedures    Medications Ordered in ED Medications  iohexol (OMNIPAQUE) 350 MG/ML injection 75 mL (75 mLs Intravenous Contrast  Given 04/01/22 1249)    ED Course/ Medical Decision Making/ A&P                             Medical Decision Making Amount and/or Complexity of Data Reviewed Labs: ordered. Radiology: ordered.   52 yo F with a chief complaints of change in speech and then inability to move.  Was made a code stroke en route.  Seen urgently by neurology and taken back to CT.  Patient is very mild hypokalemia.  Her pregnancy test is in an indeterminate range but is unlikely to be pregnant as she is postmenopausal.  Neurology recommending an MRI of the brain if negative felt she can follow-up as an outpatient.  MRI is resulted and is negative.  Patient continues to improve clinically.  Would like to go home.  PCP follow-up.  3:10 PM:  I have discussed the diagnosis/risks/treatment options with the patient and family.  Evaluation and diagnostic testing in the emergency department does not suggest an emergent condition  requiring admission or immediate intervention beyond what has been performed at this time.  They will follow up with PCP. We also discussed returning to the ED immediately if new or worsening sx occur. We discussed the sx which are most concerning (e.g., sudden worsening pain, fever, inability to tolerate by mouth) that necessitate immediate return. Medications administered to the patient during their visit and any new prescriptions provided to the patient are listed below.  Medications given during this visit Medications  iohexol (OMNIPAQUE) 350 MG/ML injection 75 mL (75 mLs Intravenous Contrast Given 04/01/22 1249)     The patient appears reasonably screen and/or stabilized for discharge and I doubt any other medical condition or other [facility] requiring further screening, evaluation, or treatment in the ED at this time prior to discharge.          Final Clinical Impression(s) / ED Diagnoses Final diagnoses:  Slurred speech    Rx / DC Orders ED Discharge Orders     None         Deno Etienne, DO 04/01/22 1510

## 2022-04-04 ENCOUNTER — Ambulatory Visit: Payer: BC Managed Care – PPO | Admitting: Family Medicine

## 2022-04-04 ENCOUNTER — Encounter: Payer: Self-pay | Admitting: Family Medicine

## 2022-04-04 VITALS — BP 120/70 | HR 83 | Temp 97.6°F | Ht 70.0 in | Wt 228.0 lb

## 2022-04-04 DIAGNOSIS — R4781 Slurred speech: Secondary | ICD-10-CM

## 2022-04-04 DIAGNOSIS — E1169 Type 2 diabetes mellitus with other specified complication: Secondary | ICD-10-CM | POA: Diagnosis not present

## 2022-04-04 DIAGNOSIS — I10 Essential (primary) hypertension: Secondary | ICD-10-CM

## 2022-04-04 DIAGNOSIS — E785 Hyperlipidemia, unspecified: Secondary | ICD-10-CM

## 2022-04-04 DIAGNOSIS — E119 Type 2 diabetes mellitus without complications: Secondary | ICD-10-CM

## 2022-04-04 DIAGNOSIS — J3489 Other specified disorders of nose and nasal sinuses: Secondary | ICD-10-CM | POA: Diagnosis not present

## 2022-04-04 DIAGNOSIS — R7989 Other specified abnormal findings of blood chemistry: Secondary | ICD-10-CM

## 2022-04-04 LAB — POC COVID19 BINAXNOW: SARS Coronavirus 2 Ag: NEGATIVE

## 2022-04-04 MED ORDER — IPRATROPIUM BROMIDE 0.03 % NA SOLN: 2.0000 | Freq: Two times a day (BID) | NASAL | 0 refills | Status: DC

## 2022-04-04 NOTE — Patient Instructions (Addendum)
Covid test for nasal congestion- please sit tight for results -if symptoms not improving in next 5 days I need you to reach back out to me- likely use doxycycline- trying to hold off if we can with already having loose stools. Also let us know if worsens at this point   Due for Tdap- lets do this in march- but you need this if you get a significant cut/scrape  I feel like most likely explanation is low blood sugar contributed to by combo colonoscopy plus ozempic. I likely am going to take you off ozempic if a1c is under 6. I also think low blood pressure played a role after colonoscopy and with diarrhea -we opted to hold off on neurology for now and only pursue follow up if recurrent issues  In future if you have stomach illness and blood pressure running lower could hold hydrochlorothiazide short term.   Please stop by lab before you goj If you have mychart- we will send your results within 3 business days of [place] receiving them.  If you do not have mychart- we will call you about results within 5 business days of [place] receiving them.  *please also note that you will see labs on mychart as soon as they post. I will later go in and write notes on them- will say "notes from Dr. Yong Channel"   Recommended follow up: Return for next already scheduled visit or sooner if needed.

## 2022-04-04 NOTE — Progress Notes (Signed)
Phone [phone] In person visit   Subjective:   Tonya Suarez is a 52 y.o. year old very pleasant female patient who presents for/with See problem oriented charting Chief Complaint  Patient presents with   Follow-up    (NO MASK)Pt is here for ER f/u due to stroke.   Past Medical History-  Patient Active Problem List   Diagnosis Date Noted   Controlled diabetes mellitus ([place]) 05/13/2021    Priority: High   Former smoker 07/19/2018    Priority: High   Hyperlipidemia associated with type 2 diabetes mellitus ([place]) 05/25/2021    Priority: Medium    Constipation 07/19/2018    Priority: Medium    History of adenomatous polyp of colon 09/23/2016    Priority: Medium    Migraines 05/06/2016    Priority: Medium    HTN (hypertension) 11/21/2011    Priority: Medium    Depression 09/06/2006    Priority: Medium    Restless legs 07/19/2018    Priority: Low   Ingrown toenail 12/20/2016    Priority: Low   Family history of cancer of GI tract 12/06/2012    Priority: Low   MENORRHAGIA 09/23/2009    Priority: Low   PLANTAR FASCIITIS 09/20/2007    Priority: Low   Osteoarthritis 09/19/2006    Priority: Low   Allergic rhinitis 09/06/2006    Priority: Low   GERD 09/06/2006    Priority: Low   Recurrent major depressive disorder, in full remission ([place]) 05/11/2021   Bilateral sciatica 12/08/2020   Mass of thigh, left 08/26/2020   Pain in right hand 09/27/2018   B12 deficiency 07/19/2018    Medications- reviewed and updated Current Outpatient Medications  Medication Sig Dispense Refill   amLODipine (NORVASC) 2.5 MG tablet TAKE 1 TABLET BY MOUTH EVERY DAY 90 tablet 3   azelastine (ASTELIN) 0.1 % nasal spray Place 2 sprays into both nostrils 2 (two) times daily. 90 mL 3   clotrimazole-betamethasone (LOTRISONE) cream Apply 1 application topically 2 (two) times daily. 45 g 1   Continuous Blood Gluc Sensor (FREESTYLE LIBRE 3 SENSOR) MISC Quantity: 2 sensors/month NDC#  [phone] Sensor Refills: PRN or 12 refills annually 2 each 12   diclofenac Sodium (VOLTAREN) 1 % GEL Apply 4 g topically 4 (four) times daily. To affected joint. 100 g 11   fluticasone (FLONASE) 50 MCG/ACT nasal spray SPRAY 2 SPRAYS INTO EACH NOSTRIL EVERY DAY 48 mL 3   gabapentin (NEURONTIN) 300 MG capsule TAKE 2 CAPSULES BY MOUTH AT BEDTIME 180 capsule 1   hydrochlorothiazide (HYDRODIURIL) 25 MG tablet TAKE 1 TABLET (25 MG TOTAL) BY MOUTH DAILY. 90 tablet 3   ipratropium (ATROVENT) 0.03 % nasal spray Place 2 sprays into both nostrils every 12 (twelve) hours. 30 mL 0   meloxicam (MOBIC) 7.5 MG tablet TAKE 1 TABLET (7.5 MG TOTAL) BY MOUTH DAILY AS NEEDED. FOR PAIN 90 tablet 1   OZEMPIC, 0.25 OR 0.5 MG/DOSE, 2 MG/3ML SOPN INJECT 0.25 MG AS DIRECTED ONCE A WEEK FOR 28 DAYS, THEN 0.5 MG ONCE A WEEK FOR 28 DAYS. PATIENT DID NOT TOLERATE METFORMIN- WE CAN COMPLETE PA IF NEEDED.. 3 mL 5   pantoprazole (PROTONIX) 40 MG tablet Take 1 tablet (40 mg total) by mouth daily. 90 tablet 3   rosuvastatin (CRESTOR) 10 MG tablet Take 1 tablet (10 mg total) by mouth once a week. 13 tablet 3   tiZANidine (ZANAFLEX) 4 MG tablet Take 0.5-1 tablets (2-4 mg total) by mouth every 8 (eight) hours as  needed for muscle spasms. 30 tablet 1   No current facility-administered medications for this visit.     Objective:  BP 120/70   Pulse 83   Temp 97.6 F (36.4 C)   Ht 5' 10"$  (1.778 m)   Wt 228 lb (103.4 kg)   LMP 06/05/2018 Comment: BTL  SpO2 96%   BMI 32.71 kg/m  Gen: NAD, resting comfortably CV: RRR no murmurs rubs or gallops Lungs: CTAB no crackles, wheeze, rhonchi Ext: no edema Skin: warm, dry  Neuro: CN II-XII intact, sensation and reflexes normal throughout, 5/5 muscle strength in bilateral upper and lower extremities. Normal finger to nose. Normal rapid alternating movements. No pronator drift. Normal gait.      Assessment and Plan    # Slurred speech S: Patient was evaluated by neurology Dr.  Rory Percy on 04/01/2022 after having a period of wakeful unresponsiveness.  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  Around 11:27 AM she called her daughter who felt her speech was slurred.  Last known normal was likely 6:30 AM.  Blood sugar was down to 60 and blood pressure was 85 systolic with normal heart rate.  EMS was called due to slurred speech.  For EMS she had no verbal output.  She opened her eyes staring into space with no response to voice or noxious stimulation  Code stroke was called.  CT of the head with no acute stroke or hemorrhage.  CMP normal other than low potassium.  Ethanol not elevated.  CBC with leukocytosis to 11.2 thousand and no anemia.  PT/INR not elevated.  APTT normal.  hCG was very mildly elevated but she is postmenopausal.  CT angio of head and neck with no acute intracranial abnormality and no large vessel occlusion, stenosis, dissection or aneurysm.  She also had an MRI with no acute intracranial abnormality.  There was a partially empty sella which was noted to potentially be an incidental finding but was noted to have associations with idiopathic intracranial hypertension.  No blurry vision reported no headaches  The CT angiogram was completed due to potential for posterior circulation stroke/brainstem involvement.  Neurology was concerned about possible catatonic state and thus MRI was completed with no stroke.  Neurology was concerned about possible conversion disorder versus psychogenic etiology.  They recommended no further workup and recommended PCP follow-up.  Thankfully patient did improve during time of evaluation  She had colonoscopy on 03/30/22 and appetite had been lower. Didn't feel great after colonoscopy. Did not eat as much but did push fluids as was having diarrhea. She last took ozempic on the 3rd of February. Daughter gave her a cup of [place] aid and by time in ED sugar was up to 97. She gradually improved  in ED. Has not felt poorly since that time- has tried to catch up on sleep and rest to get energy back and is feeling more energy. No change in vision but vision slightly blurry at baseline and sees optho next month. No increase in headaches. Since ED visit- No facial or extremity weakness. No slurred words or trouble swallowing. no blurry vision (more than usual) or double vision. No paresthesias. No confusion or word finding difficulties. Still with some loose stool after colonoscopy. She has been having more bananas.   A/P: I strongly suspect slurred speech was a combination of hypoglycemia with blood sugar at 60 and dehydration/hypotension with blood pressure down to 85 after colonoscopy a  few days prior and patient still on Ozempic who was dealing with diarrhea at the time (now improved).  I doubt TIA and was not thought likely by neurology  Extensive neurological workup reassuring-I disagree with diagnosis of conversion disorder or psychogenic as the cause of her symptoms given improvement with symptoms with hydration and being given Kool-Aid - Did offer outpatient neurology consult for second opinion but patient wants to hold off for now unless recurrence -Did have empty sella but no worsening headaches or blurry vision-doubt idiopathic intracranial hypertension/pseudotumor cerebri  She did incidentally have an elevated quantitative hCG-we will repeat this.  She did not stop by lab today so we will need to have team bring her back  She also had hypokalemia and reports has been eating a fair amount of bananas to try to correct this-agreeable to labs  # Nasal congestion S:started after colonoscopy. Whitish or yellowish discharge starting on 7th.  Still using asteline A/P: nasal congestion/runny nose main bothersome symptom- will trial atrovent nasal spray  - test for covid today-gave precautions for fall back at which point would send in doxycycline for bacterial sinusitis for double sickening or  symptoms over 10 days    # Diabetes-new diagnosis with A1c of 9.2 on 05/12/2021 S: Medication: Ozempic 0.25 mg -Did not tolerate metformin due to GI side effects though A1c substantially improved CBGs- highest she has seen is 186 after a meal. In morning 90-120 typically Lab Results  Component Value Date   HGBA1C 4.6 12/09/2021   HGBA1C 4.7 09/02/2021   HGBA1C 9.2 (H) 05/12/2021  A/P: Update A1c and I would say if under 6 likely discontinue Ozempic especially in light of recent hypoglycemia though typically not associated with hypoglycemia   #Hypertension S: Compliant with amlodipine 2.5 mg and hydrochlorothiazide 25 mg BP Readings from Last 3 Encounters:  04/04/22 120/70  04/01/22 (!) 116/91  03/30/22 113/73    A/P: Blood pressure well-controlled today but was as low as 85 on home check per daughter-suspect was running lower post colonoscopy with poor p.o. intake along with diarrhea  Recommended follow up: Return for next already scheduled visit or sooner if needed. Future Appointments  Date Time Provider [place]  04/06/2022  2:00 PM [facility]  05/16/2022  9:40 AM Marin Olp, MD [facility]  05/17/2022  4:00 PM Princess Bruins, MD [facility] None    Lab/Order associations:   ICD-10-CM   1. Slurred speech  R47.81     2. Hypertension, unspecified type  I10     3. Hyperlipidemia associated with type 2 diabetes mellitus ([place])  E11.69    E78.5     4. Controlled type 2 diabetes mellitus without complication, without long-term current use of insulin ([facility])  E11.9 CBC with Differential/Platelet    Comprehensive metabolic panel    Hemoglobin A1c    CANCELED: CBC with Differential/Platelet    CANCELED: Comprehensive metabolic panel    CANCELED: Hemoglobin A1c    5. Sinus pressure  J34.89 POC COVID-19    6. Elevated serum hCG  R79.89 hCG, quantitative, pregnancy      Meds ordered this encounter  Medications   ipratropium (ATROVENT) 0.03 %  nasal spray    Sig: Place 2 sprays into both nostrils every 12 (twelve) hours.    Dispense:  30 mL    Refill:  0    Return precautions advised.  Garret Reddish, MD

## 2022-04-05 ENCOUNTER — Encounter: Payer: Self-pay | Admitting: Internal Medicine

## 2022-04-06 ENCOUNTER — Telehealth: Payer: Self-pay | Admitting: Family Medicine

## 2022-04-06 ENCOUNTER — Other Ambulatory Visit (INDEPENDENT_AMBULATORY_CARE_PROVIDER_SITE_OTHER): Payer: BC Managed Care – PPO

## 2022-04-06 DIAGNOSIS — R7989 Other specified abnormal findings of blood chemistry: Secondary | ICD-10-CM

## 2022-04-06 DIAGNOSIS — E119 Type 2 diabetes mellitus without complications: Secondary | ICD-10-CM

## 2022-04-06 NOTE — Telephone Encounter (Signed)
..  Type of form received:Physician Results form  Additional comments: Patient would to be called once form completed and mailed out  Received by: Havlyn Ratchford  Form should be Faxed to: N/A  Form should be mailed to:  Patient's home--[id number] [address]  Is patient requesting call for pickup: No   Form placed:  In Provider's box  Attach charge sheet.   Individual made aware of 3-5 business day turn around (Y/N)? Yes

## 2022-04-07 ENCOUNTER — Telehealth: Payer: Self-pay

## 2022-04-07 ENCOUNTER — Other Ambulatory Visit: Payer: Self-pay

## 2022-04-07 DIAGNOSIS — E119 Type 2 diabetes mellitus without complications: Secondary | ICD-10-CM

## 2022-04-07 DIAGNOSIS — R7989 Other specified abnormal findings of blood chemistry: Secondary | ICD-10-CM

## 2022-04-07 LAB — CBC WITH DIFFERENTIAL/PLATELET
Basophils Absolute: 0.1 10*3/uL (ref 0.0–0.1)
Basophils Relative: 1.1 % (ref 0.0–3.0)
Eosinophils Absolute: 0.4 10*3/uL (ref 0.0–0.7)
Eosinophils Relative: 3.4 % (ref 0.0–5.0)
HCT: 42.3 % (ref 36.0–46.0)
Hemoglobin: 14.7 g/dL (ref 12.0–15.0)
Lymphocytes Relative: 44.5 % (ref 12.0–46.0)
Lymphs Abs: 4.7 10*3/uL — ABNORMAL HIGH (ref 0.7–4.0)
MCHC: 34.7 g/dL (ref 30.0–36.0)
MCV: 92.1 fl (ref 78.0–100.0)
Monocytes Absolute: 0.5 10*3/uL (ref 0.1–1.0)
Monocytes Relative: 4.7 % (ref 3.0–12.0)
Neutro Abs: 4.9 10*3/uL (ref 1.4–7.7)
Neutrophils Relative %: 46.3 % (ref 43.0–77.0)
Platelets: 239 10*3/uL (ref 150.0–400.0)
RBC: 4.6 Mil/uL (ref 3.87–5.11)
RDW: 13.9 % (ref 11.5–15.5)
WBC: 10.5 10*3/uL (ref 4.0–10.5)

## 2022-04-07 LAB — HEMOGLOBIN A1C: Hgb A1c MFr Bld: 4.5 % — ABNORMAL LOW (ref 4.6–6.5)

## 2022-04-07 NOTE — Telephone Encounter (Signed)
LMOVM to schedule lab only appt.  Lab issue with one of the tubes we drew, I have ordered test future.

## 2022-04-08 ENCOUNTER — Other Ambulatory Visit: Payer: BC Managed Care – PPO

## 2022-04-08 DIAGNOSIS — R7989 Other specified abnormal findings of blood chemistry: Secondary | ICD-10-CM | POA: Diagnosis not present

## 2022-04-08 DIAGNOSIS — E119 Type 2 diabetes mellitus without complications: Secondary | ICD-10-CM | POA: Diagnosis not present

## 2022-04-08 NOTE — Telephone Encounter (Signed)
Called and lm on pt vm making her aware form has been placed in the mail.

## 2022-04-09 LAB — COMPREHENSIVE METABOLIC PANEL
AG Ratio: 1.6 (calc) (ref 1.0–2.5)
ALT: 17 U/L (ref 6–29)
AST: 16 U/L (ref 10–35)
Albumin: 4 g/dL (ref 3.6–5.1)
Alkaline phosphatase (APISO): 70 U/L (ref 37–153)
BUN: 14 mg/dL (ref 7–25)
CO2: 26 mmol/L (ref 20–32)
Calcium: 9.7 mg/dL (ref 8.6–10.4)
Chloride: 105 mmol/L (ref 98–110)
Creat: 0.84 mg/dL (ref 0.50–1.03)
Globulin: 2.5 g/dL (calc) (ref 1.9–3.7)
Glucose, Bld: 84 mg/dL (ref 65–99)
Potassium: 4.1 mmol/L (ref 3.5–5.3)
Sodium: 139 mmol/L (ref 135–146)
Total Bilirubin: 0.7 mg/dL (ref 0.2–1.2)
Total Protein: 6.5 g/dL (ref 6.1–8.1)

## 2022-04-09 LAB — HCG, SERUM, QUALITATIVE: Preg, Serum: NEGATIVE

## 2022-04-27 ENCOUNTER — Other Ambulatory Visit: Payer: Self-pay | Admitting: Family Medicine

## 2022-05-16 ENCOUNTER — Encounter: Payer: BC Managed Care – PPO | Admitting: Family Medicine

## 2022-05-17 ENCOUNTER — Ambulatory Visit: Payer: BC Managed Care – PPO | Admitting: Obstetrics & Gynecology

## 2022-05-23 ENCOUNTER — Other Ambulatory Visit: Payer: Self-pay | Admitting: Family Medicine

## 2022-06-14 DIAGNOSIS — E119 Type 2 diabetes mellitus without complications: Secondary | ICD-10-CM | POA: Diagnosis not present

## 2022-06-14 LAB — HM DIABETES EYE EXAM

## 2022-06-16 ENCOUNTER — Ambulatory Visit: Payer: BC Managed Care – PPO | Admitting: Family Medicine

## 2022-06-16 ENCOUNTER — Other Ambulatory Visit: Payer: Self-pay

## 2022-06-16 VITALS — BP 122/84 | HR 70 | Ht 70.0 in | Wt 231.0 lb

## 2022-06-16 DIAGNOSIS — G8929 Other chronic pain: Secondary | ICD-10-CM

## 2022-06-16 DIAGNOSIS — M25511 Pain in right shoulder: Secondary | ICD-10-CM

## 2022-06-16 DIAGNOSIS — G5621 Lesion of ulnar nerve, right upper limb: Secondary | ICD-10-CM

## 2022-06-16 DIAGNOSIS — M25552 Pain in left hip: Secondary | ICD-10-CM | POA: Diagnosis not present

## 2022-06-16 MED ORDER — TIZANIDINE HCL 4 MG PO TABS: 2.0000 mg | ORAL_TABLET | Freq: Three times a day (TID) | ORAL | 2 refills | Status: DC | PRN

## 2022-06-16 NOTE — Progress Notes (Signed)
Rubin Payor, PhD, LAT, ATC acting as a scribe for Tonya Graham, MD.  Tonya Suarez is a 52 y.o. female who presents to [facility] at [facility] today for exacerbation of her R shoulder pain. Pt was last seen by Dr. Denyse Amass on 03/07/22 and was given a R subacromial steroid injection and advised to cont HEP. Today, pt reports R shoulder pain returned over the last 2-3 wks. Pt locates pain to the anterior aspect of her R shoulder. She feels like this pain is worse than previously. Pt has been compliant w/ HEP.   She notes some numbness and tingling extending to the ulnar aspect of her right hand.  This occurs primarily with sleep and she will wake up with her hand numb and have to shake it out.  She denies any weakness.  She notes this pain or numbness or tingling sensation is not directly related to her shoulder pain.  Additionally she is having some recurrent left lateral hip pain.  She was seen previously for trochanteric bursitis and had physical therapy in the past.  Dx imaging: 03/04/21 C-spine & R shoulder XR   Pertinent review of systems: No fevers or chills  Relevant historical information: Hypertension and diabetes.   Exam:  BP 122/84   Pulse 70   Ht  (1.778 m)   Wt 231 lb (104.8 kg)   LMP 06/05/2018 Comment: BTL  SpO2 97%   BMI 33.15 kg/m  General: Well Developed, well nourished, and in no acute distress.   MSK: Right shoulder normal-appearing Tender palpation trapezius. Decreased shoulder range of motion abduction limited to 100 degrees.  Internal rotation limited to posterior iliac crest. External rotation is full. Strength decreased abduction 4/5 otherwise strength is intact. Positive Hawkins and Neer's test.  Positive empty can test. Negative Yergason's and speeds test.  C-spine: Normal appearing normal cervical motion.  Upper extremity strength is intact bilaterally except noted above.  Right elbow normal-appearing normal motion.   Positive Tinel's at cubital tunnel.  Left hip: Mild antalgic gait.    Lab and Radiology Results  Procedure: Real-time Ultrasound Guided Injection of right shoulder subacromial bursa Device: Philips Affiniti 50G Images permanently stored and available for review in PACS Verbal informed consent obtained.  Discussed risks and benefits of procedure. Warned about infection, bleeding, hyperglycemia damage to structures among others. Patient expresses understanding and agreement Time-out conducted.   Noted no overlying erythema, induration, or other signs of local infection.   Skin prepped in a sterile fashion.   Local anesthesia: Topical Ethyl chloride.   With sterile technique and under real time ultrasound guidance: 40 mg of Kenalog and 2 mL Marcaine injected into subacromial bursa. Fluid seen entering the bursa.   Completed without difficulty   Pain immediately resolved suggesting accurate placement of the medication.   Advised to call if fevers/chills, erythema, induration, drainage, or persistent bleeding.   Images permanently stored and available for review in the ultrasound unit.  Impression: Technically successful ultrasound guided injection.        Assessment and Plan: 52 y.o. female with right shoulder pain.  Patient is experiencing acute exacerbation of a chronic issue today.  Pain is primarily subacromial impingement/bursitis.  There may be a component of trapezius muscle dysfunction and spasm as well.  Plan for subacromial injection and will use tizanidine and refer back to physical therapy.  Recheck in 6 weeks.  Additionally she is having some right hand paresthesias in the ulnar 2 digits.  This is thought to be much more likely to be cubital tunnel syndrome rather than cervical radiculopathy.  Recommend cubital tunnel splint at bedtime.  Lastly she has some left lateral hip pain thought to be trochanteric bursitis.  Again will refer to PT that should be helpful.  This  issue was addressed partially today we will address it more fully at recheck if needed.   PDMP not reviewed this encounter. Orders Placed This Encounter  Procedures   [place] LIMITED JOINT SPACE STRUCTURES UP RIGHT(NO LINKED CHARGES)    Order Specific Question:   Reason for Exam (SYMPTOM  OR DIAGNOSIS REQUIRED)    Answer:   right shoulder pain    Order Specific Question:   Preferred imaging location?    Answer:   [facility] referral to Physical Therapy    Referral Priority:   Routine    Referral Type:   Physical Medicine    Referral Reason:   Specialty Services Required    Requested Specialty:   Physical Therapy    Number of Visits Requested:   1   Meds ordered this encounter  Medications   tiZANidine (ZANAFLEX) 4 MG tablet    Sig: Take 0.5-1 tablets (2-4 mg total) by mouth every 8 (eight) hours as needed for muscle spasms.    Dispense:  30 tablet    Refill:  2     Discussed warning signs or symptoms. Please see discharge instructions. Patient expresses understanding.   The above documentation has been reviewed and is accurate and complete Tonya Suarez, M.D.

## 2022-06-16 NOTE — Patient Instructions (Addendum)
Thank you for coming in today.   You received an injection today. Seek immediate medical attention if the joint becomes red, extremely painful, or is oozing fluid.   I've sent a prescription for Tizanidine to your pharmacy.   Try a cubital tunnel brace for your elbow for the tingling in your hand.   I've referred you to Physical Therapy.  Let us know if you don't hear from them in one week.   Check back in 6 weeks

## 2022-06-21 ENCOUNTER — Encounter: Payer: BC Managed Care – PPO | Admitting: Family Medicine

## 2022-06-28 ENCOUNTER — Ambulatory Visit: Payer: BC Managed Care – PPO | Admitting: Physical Therapy

## 2022-07-12 ENCOUNTER — Other Ambulatory Visit: Payer: Self-pay | Admitting: Family Medicine

## 2022-07-28 ENCOUNTER — Ambulatory Visit: Payer: BC Managed Care – PPO | Admitting: Family Medicine

## 2022-08-08 ENCOUNTER — Ambulatory Visit: Payer: BC Managed Care – PPO | Admitting: Podiatry

## 2022-08-08 ENCOUNTER — Encounter: Payer: Self-pay | Admitting: Podiatry

## 2022-08-08 DIAGNOSIS — M79673 Pain in unspecified foot: Secondary | ICD-10-CM

## 2022-08-08 DIAGNOSIS — G8929 Other chronic pain: Secondary | ICD-10-CM | POA: Diagnosis not present

## 2022-08-08 DIAGNOSIS — E0843 Diabetes mellitus due to underlying condition with diabetic autonomic (poly)neuropathy: Secondary | ICD-10-CM | POA: Diagnosis not present

## 2022-08-08 DIAGNOSIS — D2371 Other benign neoplasm of skin of right lower limb, including hip: Secondary | ICD-10-CM | POA: Diagnosis not present

## 2022-08-08 NOTE — Progress Notes (Signed)
Chief Complaint  Patient presents with   Nail Problem    "I had both of my ingrown toenails done.  The left one still bothers me.  There's a buildup of skin.  I have a callus on the back of my right big toe.  I want to see about getting fitted for inserts because I wear steel toe shoes at work." N - callus L - plantar hallux right D - 2-3 years O - gradually worse C - sore A - walking, standing, pressure T - liquid acid     Subjective: 52 y.o. female presenting to the office today for evaluation of a symptomatic skin lesion to the plantar aspect of the right great toe that she has noticed over the past few months.  She has just recently began applying salicylic acid to the area. Patient also has history of partial nail matricectomy's for ingrown toenails performed to the left great toe.  Patient has noticed some pain and sensitivity associated to the medial border of the left great toenail plate. Finally, the patient states that she works on her feet for 12-hour shifts at [employer].  She was recently diagnosed with diabetes and would like to discuss possible insoles especially during her 12-hour work shift.   Past Medical History:  Diagnosis Date   Abnormal Pap smear of cervix    ascus hpv hr neg   Allergy    Arthritis    Chronic headaches    Depression    Diabetes mellitus without complication (HCC)    GERD (gastroesophageal reflux disease)    Hypertension    Low back pain    UTI (urinary tract infection)     Past Surgical History:  Procedure Laterality Date   CERVICAL BIOPSY  W/ LOOP ELECTRODE EXCISION     COLPOSCOPY     CIN2/3   TUBAL LIGATION      Allergies  Allergen Reactions   Amoxicillin Swelling    Lip swelling     Objective:  Physical Exam General: Alert and oriented x3 in no acute distress  Dermatology: Hyperkeratotic lesion(s) present on the plantar aspect of the right great toe. Pain on palpation with a central nucleated core noted. Skin  is warm, dry and supple bilateral lower extremities. Negative for open lesions or macerations. History of partial nail matricectomy to the medial border of the left great toe.  There is a significant amount of callus to the medial nail fold as well as some subungual debris.  There is no visible nail plate intruding into the medial nail fold  Vascular: Palpable pedal pulses bilaterally. No edema or erythema noted. Capillary refill within normal limits.  Neurological: Grossly intact via light touch  Musculoskeletal Exam: Pain on palpation at the keratotic lesion(s) noted. Range of motion within normal limits bilateral. Muscle strength 5/5 in all groups bilateral.  Assessment: 1.  Symptomatic benign skin lesion plantar aspect of the right great toe 2.  Subungual debris and callus medial nail fold left hallux nail plate 3.  T2DM  -Excisional debridement of the hyperkeratotic lesion to the plantar aspect of the right great toe was performed today using a 312 scalpel.  Salicylic acid applied -Continue OTC salicylic acid with a Band-Aid. -Excisional debridement of the debris and callus to the medial nail fold of the left hallux nail plate was also performed today using a combination of a tissue nipper and nail nipper.  Patient did feel relief. -Today we discussed custom molded orthotics.  Unfortunately I  do not believe her insurance would cover diabetic shoes with insoles but I do believe custom molded orthotics will help support the medial longitudinal arch of the foot and alleviate pressure points throughout the foot especially during her 12-hour work shift. -Appointment with orthotics department for custom molded insoles.  Order placed -Return to clinic annually   Plan of Care:  -Patient evaluated -Excisional debridement of keratoic lesion(s) using a chisel blade was performed without incident.  -Salicylic acid applied with a bandaid -Return to the clinic PRN.   Felecia Shelling, DPM [facility]  Dr. Felecia Shelling, DPM    [address] [place], [place] [address]                Office [phone]  Fax [phone]

## 2022-08-16 ENCOUNTER — Ambulatory Visit: Payer: BC Managed Care – PPO | Admitting: Family Medicine

## 2022-08-16 NOTE — Progress Notes (Deleted)
   Rubin Payor, PhD, LAT, ATC acting as a scribe for Clementeen Graham, MD.  Tonya Suarez is a 52 y.o. female who presents to [facility] at [facility] today for neck pain.  Patient was previously seen by Dr. Denyse Amass on 06/16/2022 for right shoulder pain and was given a right subacromial steroid injection.  Today, patient presents with neck pain ongoing***.  Patient locates pain to***  Radiates:  UE numbness/tingling: UE weakness: Aggravates: Treatments tried:  Dx imaging: 03/04/21 C-spine & R shoulder XR   Pertinent review of systems: ***  Relevant historical information: ***   Exam:  LMP 06/05/2018 Comment: BTL General: Well Developed, well nourished, and in no acute distress.   MSK: ***    Lab and Radiology Results No results found for this or any previous visit (from the past 72 hour(s)). No results found.     Assessment and Plan: 52 y.o. female with ***   PDMP not reviewed this encounter. No orders of the defined types were placed in this encounter.  No orders of the defined types were placed in this encounter.    Discussed warning signs or symptoms. Please see discharge instructions. Patient expresses understanding.   ***

## 2022-08-17 DIAGNOSIS — R509 Fever, unspecified: Secondary | ICD-10-CM | POA: Diagnosis not present

## 2022-08-17 DIAGNOSIS — U071 COVID-19: Secondary | ICD-10-CM | POA: Diagnosis not present

## 2022-08-17 DIAGNOSIS — R0602 Shortness of breath: Secondary | ICD-10-CM | POA: Diagnosis not present

## 2022-08-17 DIAGNOSIS — E119 Type 2 diabetes mellitus without complications: Secondary | ICD-10-CM | POA: Diagnosis not present

## 2022-08-24 ENCOUNTER — Other Ambulatory Visit: Payer: BC Managed Care – PPO

## 2022-09-03 DIAGNOSIS — Z1231 Encounter for screening mammogram for malignant neoplasm of breast: Secondary | ICD-10-CM | POA: Diagnosis not present

## 2022-09-03 LAB — HM MAMMOGRAPHY

## 2022-09-07 ENCOUNTER — Encounter: Payer: Self-pay | Admitting: Family Medicine

## 2022-10-06 ENCOUNTER — Encounter (INDEPENDENT_AMBULATORY_CARE_PROVIDER_SITE_OTHER): Payer: Self-pay

## 2022-10-06 NOTE — Progress Notes (Addendum)
Aleen Sells D.[facility] [address] [place] [address] Phone: [phone]   Assessment and Plan:     1. Chronic right shoulder pain 2. Subacromial bursitis of right shoulder joint  -Chronic with exacerbation, subsequent sports medicine visit - Most consistent with recurrent subacromial bursitis of right shoulder based on HPI and physical exam - Last CSI performed on 06/16/2022 provided minimal relief.  CSI subacromial space had previously provided several months relief for patient, so patient elects to proceed with additional subacromial CSI at today's visit.  Tolerated well per note below.  Patient's blood glucose may temporarily increase with past medical history of DM type II. - May use Tylenol for day-to-day pain relief - reStart HEP  Procedure: Subacromial Injection Side: Right  Risks explained and consent was given verbally. The site was cleaned with alcohol prep. A steroid injection was performed from posterior approach using 4mL of 1% lidocaine without epinephrine and 2mL of kenalog 40mg /ml. This was well tolerated and resulted in symptomatic relief.  Needle was removed, hemostasis achieved, and post injection instructions were explained.   Pt was advised to call or return to clinic if these symptoms worsen or fail to improve as anticipated.   Pertinent previous records reviewed include none   Follow Up: As needed if no improvement or worsening of symptoms.  Could consider intra-articular CSI versus advanced imaging versus NSAID course   Subjective:   I, Moenique Parris, am serving as a Neurosurgeon for Doctor Richardean Sale  Chief Complaint: right shoulder pain   HPI:   06/16/2022 Tonya Suarez is a 52 y.o. female who presents to [facility] at [facility] today for exacerbation of her R shoulder pain. Pt was last seen by Dr. Denyse Amass on 03/07/22 and was given a R subacromial steroid injection and advised to cont  HEP. Today, pt reports R shoulder pain returned over the last 2-3 wks. Pt locates pain to the anterior aspect of her R shoulder. She feels like this pain is worse than previously. Pt has been compliant w/ HEP.    She notes some numbness and tingling extending to the ulnar aspect of her right hand.  This occurs primarily with sleep and she will wake up with her hand numb and have to shake it out.  She denies any weakness.  She notes this pain or numbness or tingling sensation is not directly related to her shoulder pain.   Additionally she is having some recurrent left lateral hip pain.  She was seen previously for trochanteric bursitis and had physical therapy in the past.   Dx imaging: 03/04/21 C-spine & R shoulder XR    Pertinent review of systems: No fevers or chills   Relevant historical information: Hypertension and diabetes.  10/07/2022 Patient states that she is ready for a new CSI the pain is all the time and radiating up to her neck    Relevant Historical Information: DM type II, hypertension  Additional pertinent review of systems negative.   Current Outpatient Medications:    amLODipine (NORVASC) 2.5 MG tablet, TAKE 1 TABLET BY MOUTH EVERY DAY, Disp: 90 tablet, Rfl: 3   azelastine (ASTELIN) 0.1 % nasal spray, Place 2 sprays into both nostrils 2 (two) times daily., Disp: 90 mL, Rfl: 3   clotrimazole-betamethasone (LOTRISONE) cream, Apply 1 application topically 2 (two) times daily., Disp: 45 g, Rfl: 1   Continuous Blood Gluc Sensor (FREESTYLE LIBRE 3 SENSOR) MISC, Quantity: 2 sensors/month NDC# [phone] Sensor  Refills: PRN or 12 refills annually, Disp: 2 each, Rfl: 12   diclofenac Sodium (VOLTAREN) 1 % GEL, Apply 4 g topically 4 (four) times daily. To affected joint., Disp: 100 g, Rfl: 11   fluticasone (FLONASE) 50 MCG/ACT nasal spray, SPRAY 2 SPRAYS INTO EACH NOSTRIL EVERY DAY, Disp: 48 mL, Rfl: 3   gabapentin (NEURONTIN) 300 MG capsule, TAKE 2 CAPSULES BY MOUTH AT BEDTIME,  Disp: 180 capsule, Rfl: 1   hydrochlorothiazide (HYDRODIURIL) 25 MG tablet, TAKE 1 TABLET (25 MG TOTAL) BY MOUTH DAILY., Disp: 90 tablet, Rfl: 3   ipratropium (ATROVENT) 0.03 % nasal spray, PLACE 2 SPRAYS INTO BOTH NOSTRILS EVERY 12 (TWELVE) HOURS., Disp: 90 mL, Rfl: 1   meloxicam (MOBIC) 7.5 MG tablet, TAKE 1 TABLET (7.5 MG TOTAL) BY MOUTH DAILY AS NEEDED. FOR PAIN, Disp: 90 tablet, Rfl: 1   OZEMPIC, 0.25 OR 0.5 MG/DOSE, 2 MG/3ML SOPN, INJECT 0.25 MG AS DIRECTED ONCE A WEEK FOR 28 DAYS, THEN 0.5 MG ONCE A WEEK FOR 28 DAYS. PATIENT DID NOT TOLERATE METFORMIN- WE CAN COMPLETE PA IF NEEDED.., Disp: 3 mL, Rfl: 5   pantoprazole (PROTONIX) 40 MG tablet, Take 1 tablet (40 mg total) by mouth daily., Disp: 90 tablet, Rfl: 3   rosuvastatin (CRESTOR) 10 MG tablet, Take 1 tablet (10 mg total) by mouth once a week., Disp: 13 tablet, Rfl: 3   tiZANidine (ZANAFLEX) 4 MG tablet, Take 0.5-1 tablets (2-4 mg total) by mouth every 8 (eight) hours as needed for muscle spasms., Disp: 30 tablet, Rfl: 2   Objective:     Vitals:   10/07/22 1331  BP: 132/82  Pulse: 77  SpO2: 100%  Weight: 228 lb (103.4 kg)  Height: 5\' 10"  (1.778 m)      Body mass index is 32.71 kg/m.    Physical Exam:    Gen: Appears well, nad, nontoxic and pleasant Neuro:sensation intact, strength is 5/5 with df/pf/inv/ev, muscle tone wnl Skin: no suspicious lesion or defmority Psych: A&O, appropriate mood and affect  Right shoulder:  No deformity, swelling or muscle wasting No scapular winging FF 180 with painful arc, abd 180 with painful arc, int 0 with painful arc, ext 90 TTP anterior shoulder NTTP over the Mullens, clavicle, ac, coracoid, biceps groove, humerus, deltoid, trapezius, cervical spine Positive Neer, Hawkins, empty can, O'Brien Neg  , subscap liftoff, speeds Neg ant drawer, sulcus sign, apprehension Negative Spurling's test bilat FROM of neck    Electronically signed by:  Aleen Sells D.[facility] 1:46 PM 10/07/22

## 2022-10-07 ENCOUNTER — Ambulatory Visit: Payer: BC Managed Care – PPO | Admitting: Sports Medicine

## 2022-10-07 VITALS — BP 132/82 | HR 77 | Ht 70.0 in | Wt 228.0 lb

## 2022-10-07 DIAGNOSIS — M25511 Pain in right shoulder: Secondary | ICD-10-CM

## 2022-10-07 DIAGNOSIS — G8929 Other chronic pain: Secondary | ICD-10-CM | POA: Diagnosis not present

## 2022-10-07 DIAGNOSIS — M7551 Bursitis of right shoulder: Secondary | ICD-10-CM | POA: Diagnosis not present

## 2022-10-07 NOTE — Patient Instructions (Signed)
Shoulder HEP  As needed follow up

## 2022-10-07 NOTE — Addendum Note (Signed)
Addended by: Richardean Sale on: 10/07/2022 02:38 PM   Modules accepted: Level of Service

## 2022-10-12 ENCOUNTER — Ambulatory Visit: Payer: BC Managed Care – PPO | Admitting: Family Medicine

## 2022-11-07 ENCOUNTER — Other Ambulatory Visit: Payer: Self-pay | Admitting: Family Medicine

## 2022-11-11 ENCOUNTER — Ambulatory Visit (INDEPENDENT_AMBULATORY_CARE_PROVIDER_SITE_OTHER): Payer: BC Managed Care – PPO | Admitting: Family Medicine

## 2022-11-11 ENCOUNTER — Encounter: Payer: Self-pay | Admitting: Family Medicine

## 2022-11-11 VITALS — BP 114/70 | HR 75 | Temp 97.8°F | Ht 70.0 in | Wt 230.4 lb

## 2022-11-11 DIAGNOSIS — Z0000 Encounter for general adult medical examination without abnormal findings: Secondary | ICD-10-CM | POA: Diagnosis not present

## 2022-11-11 DIAGNOSIS — F3342 Major depressive disorder, recurrent, in full remission: Secondary | ICD-10-CM

## 2022-11-11 DIAGNOSIS — E1169 Type 2 diabetes mellitus with other specified complication: Secondary | ICD-10-CM | POA: Diagnosis not present

## 2022-11-11 DIAGNOSIS — I10 Essential (primary) hypertension: Secondary | ICD-10-CM | POA: Diagnosis not present

## 2022-11-11 DIAGNOSIS — E119 Type 2 diabetes mellitus without complications: Secondary | ICD-10-CM | POA: Diagnosis not present

## 2022-11-11 DIAGNOSIS — Z23 Encounter for immunization: Secondary | ICD-10-CM

## 2022-11-11 DIAGNOSIS — E785 Hyperlipidemia, unspecified: Secondary | ICD-10-CM | POA: Diagnosis not present

## 2022-11-11 DIAGNOSIS — E538 Deficiency of other specified B group vitamins: Secondary | ICD-10-CM

## 2022-11-11 NOTE — Patient Instructions (Addendum)
Please stop by lab before you go If you have mychart- we will send your results within 3 business days of [place] receiving them.  If you do not have mychart- we will call you about results within 5 business days of [place] receiving them.  *please also note that you will see labs on mychart as soon as they post. I will later go in and write notes on them- will say "notes from Dr. Durene Cal"   Call gynecology for follow up   Recommended follow up: Return in about 6 months (around 05/11/2023) for followup or sooner if needed.Schedule b4 you leave.

## 2022-11-11 NOTE — Progress Notes (Signed)
Phone [phone]   Subjective:  Patient presents today for their annual physical. Chief complaint-noted.   See problem oriented charting- ROS- full  review of systems was completed and negative except for: vibration under left breast  The following were reviewed and entered/updated in epic: Past Medical History:  Diagnosis Date   Abnormal Pap smear of cervix    ascus hpv hr neg   Allergy    Arthritis    Chronic headaches    Depression    Diabetes mellitus without complication (HCC)    GERD (gastroesophageal reflux disease)    Hypertension    Low back pain    UTI (urinary tract infection)    Patient Active Problem List   Diagnosis Date Noted   Controlled diabetes mellitus (HCC) 05/13/2021    Priority: High   Former smoker 07/19/2018    Priority: High   Hyperlipidemia associated with type 2 diabetes mellitus (HCC) 05/25/2021    Priority: Medium    Constipation 07/19/2018    Priority: Medium    History of adenomatous polyp of colon 09/23/2016    Priority: Medium    Migraines 05/06/2016    Priority: Medium    HTN (hypertension) 11/21/2011    Priority: Medium    Depression 09/06/2006    Priority: Medium    Restless legs 07/19/2018    Priority: Low   Ingrown toenail 12/20/2016    Priority: Low   Family history of cancer of GI tract 12/06/2012    Priority: Low   MENORRHAGIA 09/23/2009    Priority: Low   PLANTAR FASCIITIS 09/20/2007    Priority: Low   Osteoarthritis 09/19/2006    Priority: Low   Allergic rhinitis 09/06/2006    Priority: Low   GERD 09/06/2006    Priority: Low   Recurrent major depressive disorder, in full remission (HCC) 05/11/2021   Bilateral sciatica 12/08/2020   Mass of thigh, left 08/26/2020   Pain in right hand 09/27/2018   B12 deficiency 07/19/2018   Past Surgical History:  Procedure Laterality Date   CERVICAL BIOPSY  W/ LOOP ELECTRODE EXCISION     COLPOSCOPY     CIN2/3   TUBAL LIGATION      Family History  Problem Relation  Age of Onset   Colon polyps Mother    Diabetes Mother    Hypertension Mother    Hyperlipidemia Mother    Kidney disease Mother        has fistula- could need dialysis at any time   Colon polyps Father    Diabetes Father        mother   Hyperlipidemia Father        mother   Hypertension Father        mother   Colon cancer Father        60s, and grandmother   Lung cancer Father        in 62s   Cancer - Other Sister        duodenal   Stomach cancer Sister    Colon cancer Maternal Aunt    Colon cancer Paternal Grandmother    Esophageal cancer Neg Hx    Liver cancer Neg Hx    Pancreatic cancer Neg Hx    Rectal cancer Neg Hx     Medications- reviewed and updated Current Outpatient Medications  Medication Sig Dispense Refill   amLODipine (NORVASC) 2.5 MG tablet TAKE 1 TABLET BY MOUTH EVERY DAY 90 tablet 3   azelastine (ASTELIN) 0.1 % nasal spray Place 2  sprays into both nostrils 2 (two) times daily. 90 mL 3   Continuous Blood Gluc Sensor (FREESTYLE LIBRE 3 SENSOR) MISC Quantity: 2 sensors/month NDC# [phone] Sensor Refills: PRN or 12 refills annually 2 each 12   diclofenac Sodium (VOLTAREN) 1 % GEL Apply 4 g topically 4 (four) times daily. To affected joint. 100 g 11   fluticasone (FLONASE) 50 MCG/ACT nasal spray SPRAY 2 SPRAYS INTO EACH NOSTRIL EVERY DAY 48 mL 3   gabapentin (NEURONTIN) 300 MG capsule TAKE 2 CAPSULES BY MOUTH AT BEDTIME 180 capsule 1   hydrochlorothiazide (HYDRODIURIL) 25 MG tablet TAKE 1 TABLET (25 MG TOTAL) BY MOUTH DAILY. 90 tablet 3   ipratropium (ATROVENT) 0.03 % nasal spray PLACE 2 SPRAYS INTO BOTH NOSTRILS EVERY 12 (TWELVE) HOURS. 90 mL 1   meloxicam (MOBIC) 7.5 MG tablet TAKE 1 TABLET (7.5 MG TOTAL) BY MOUTH DAILY AS NEEDED. FOR PAIN 90 tablet 1   pantoprazole (PROTONIX) 40 MG tablet Take 1 tablet (40 mg total) by mouth daily. 90 tablet 3   rosuvastatin (CRESTOR) 10 MG tablet Take 1 tablet (10 mg total) by mouth once a week. 13 tablet 3    Semaglutide,0.25 or 0.5MG /DOS, (OZEMPIC, 0.25 OR 0.5 MG/DOSE,) 2 MG/3ML SOPN INJECT 0.25 MG AS DIRECTED ONCE A WEEK FOR 28 DAYS, THEN 0.5 MG ONCE A WEEK FOR 28 DAYS 3 mL 5   tiZANidine (ZANAFLEX) 4 MG tablet Take 0.5-1 tablets (2-4 mg total) by mouth every 8 (eight) hours as needed for muscle spasms. 30 tablet 2   clotrimazole-betamethasone (LOTRISONE) cream Apply 1 application topically 2 (two) times daily. (Patient not taking: Reported on 11/11/2022) 45 g 1   No current facility-administered medications for this visit.    Allergies-reviewed and updated Allergies  Allergen Reactions   Amoxicillin Swelling    Lip swelling    Social History   Social History Narrative   Married May 13th 2017. 2 children- son 88 (married) and daughter 11. Granddaughter august 2017.       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 Oropharynx normal Neck: no thyromegaly CV: RRR no murmurs rubs or gallops Lungs: CTAB no crackles, wheeze, rhonchi Abdomen: soft/nontender/nondistended/normal bowel sounds. No rebound or guarding.  Ext: no edema Skin: warm, dry Neuro: grossly normal, moves all extremities, PERRLA   Assessment and Plan   52 y.o. female presenting for annual physical.  Health Maintenance counseling: 1. Anticipatory guidance: Patient counseled regarding regular dental exams -q6 months, eye exams - yearly,  avoiding smoking and second hand smoke- some second hand smoke , limiting alcohol to 1 beverage per day-once a week or less , no illicit drugs .   2. Risk factor reduction:  Advised patient of need for regular exercise and diet rich and fruits and vegetables to reduce risk of  heart attack and stroke.  Exercise- feels she could increase but thankfully walks a lot at work.  Diet/weight management-weight down 5 pounds in last year and was down 8 pounds last year-congratulated continued efforts.  Wt Readings from Last 3 Encounters:  11/11/22 230 lb 6.4 oz (  104.5 kg)  10/07/22 228 lb (103.4 kg)  06/16/22 231 lb (104.8 kg)  3. Immunizations/screenings/ancillary studies-Tdap and flu today  Immunization History  Administered Date(s) Administered   Influenza Split 11/19/2007, 12/03/2008, 11/21/2011, 11/22/2018   Influenza Whole 11/19/2007, 12/03/2008, 11/22/2018   Influenza, Seasonal, Injecte, Preservative Fre 11/11/2022   Influenza,inj,Quad PF,6+ Mos 11/21/2012, 12/24/2015, 11/03/2020, 12/09/2021   Influenza,inj,quad, With Preservative 12/18/2017   Influenza-Unspecified 12/17/2014, 12/04/2019   PFIZER(Purple Top)SARS-COV-2 Vaccination 05/30/2019, 06/26/2019, 02/19/2020   Td 02/21/2001   Tdap 11/21/2011, 11/11/2022   Zoster Recombinant(Shingrix) 03/03/2020, 09/30/2020   4. Cervical cancer screening- follows with Dr. Salli Quarry office.  High grade squamous intraepithelial lesion (HGSIL), grade 3 CIN, on biopsy of cervix june 2023 led to LEEP- yearly pap smear schedule - plans to call to schedule 5. Breast cancer screening-  breast exam with GYN and mammogram September 03 2022 6. Colon cancer screening - Colonoscopy February 2024 with Dr. Marina Goodell and adenomatous polyp and 5-year follow-up-did have slurred speech and low sugar several days after his colonoscopy- would take her off Ozempic if on at time of next procedure  7. Skin cancer screening-lower risk due to melanin content.  advised regular sunscreen use. Denies worrisome, changing, or new skin lesions.  8. Birth control/STD check- tubal ligation/monogamous  9. Osteoporosis screening at 65-we will plan on this  10. Smoking associated screening -former smoker-Black and milds perhaps 1-1-1/2 a day in the past-below threshold  for lung cancer screening but will get UA   Status of chronic or acute concerns   #Under left breast has noted a vibration sensation- almost feels like phone in bra but its not there for about a month. Better with hydration - happened 1-2 x a day in last week under a minute. No pain or shortness of breath. Had a stressful home situation recently- anxiety could play a role -we opted to add TSH to labs and offered cardiology referral- shed like to hold on that for now unless worsens  #hip still bothering her- plans to call Dr. Denyse Amass back- history of bulging disc- left worse than right  #some cramping- even with bananas in last month still getting some leg cramps- may need potassium supplement on hydrochlorothiazide   # Diabetes-new diagnosis with A1c of 9.2 on 05/12/2021 S: Medication:Trial Ozempic but later stopped with low blood sugars- but restated 0.25 mg -Did not tolerate metformin due to GI side effects though A1c substantially improved CBGs- 160's when she has checked so restarted Ozempic as low as 120. Sugars went down some on restart Lab Results  Component Value Date   HGBA1C 4.5 (L) 04/06/2022   HGBA1C 4.6 12/09/2021   HGBA1C 4.7 09/02/2021   A/P: hopefully stable- update a1c today. Continue current meds for now with Ozempic just 0.25 mg- can bump dose if needed   #Depression S:medication: none prior  Paxil 10 mg in past - trial counseling 2022- did a few sessoins- not most helpful    11/11/2022    2:56 PM 04/04/2022   11:14 AM 12/09/2021    4:07 PM  Depression screen PHQ 2/9  Decreased Interest 0 0 0  Down, Depressed, Hopeless 0 0 0  PHQ - 2 Score 0 0 0  Altered sleeping  2 0  Tired, decreased energy  0 0  Change in appetite  0 0  Feeling bad or failure about yourself   0 0  Trouble concentrating  0 0  Moving slowly or fidgety/restless  0 0  Suicidal thoughts  0 0  PHQ-9 Score  2 0  Difficult doing work/chores  Somewhat difficult Not difficult at all   A/P: full  remission without medicine- continue to monitor     #Hypertension S: Compliant with amlodipine 2.5 mg and hydrochlorothiazide 25 mg BP Readings from Last 3 Encounters:  11/11/22 114/70  10/07/22 132/82  06/16/22 122/84  A/P: stable- continue current medicines     # Migraines with aura S: primarily uses dollar general migraine tablets. fiorcet in past.  -may have weeks with no issues but may have days in a row where needs it- seems to be worse when waking up and neck pain issues A/P: stable- continue current medicines    #Osteoarthritis- lumbar spine, knees, shoulders.  Meloxicam helpful in the past as would like to avoid if possible due to hypertension  Recommended follow up: Return in about 6 months (around 05/11/2023) for followup or sooner if needed.Schedule b4 you leave.  Lab/Order associations: fasting   ICD-10-CM   1. Need for influenza vaccination  Z23 Flu vaccine trivalent PF, 6mos and older(Flulaval,Afluria,Fluarix,Fluzone)    2. Need for Tdap vaccination  Z23 Tdap vaccine greater than or equal to 7yo IM    3. Preventative health care  Z00.00     4. Controlled type 2 diabetes mellitus without complication, without long-term current use of insulin (HCC)  E11.9     5. Hyperlipidemia associated with type 2 diabetes mellitus (HCC)  E11.69    E78.5     6. Hypertension, unspecified type  I10     7. Recurrent major depressive disorder, in full remission (HCC)  F33.42     8. B12 deficiency  E53.8       No orders of the defined types were placed in this encounter.   Return precautions advised.  Tana Conch, MD

## 2022-11-15 LAB — CBC WITH DIFFERENTIAL/PLATELET
Absolute Monocytes: 418 cells/uL (ref 200–950)
Basophils Absolute: 53 cells/uL (ref 0–200)
Basophils Relative: 0.6 %
Eosinophils Absolute: 240 cells/uL (ref 15–500)
Eosinophils Relative: 2.7 %
HCT: 43.6 % (ref 35.0–45.0)
Hemoglobin: 15.1 g/dL (ref 11.7–15.5)
Lymphs Abs: 4041 cells/uL — ABNORMAL HIGH (ref 850–3900)
MCH: 31.3 pg (ref 27.0–33.0)
MCHC: 34.6 g/dL (ref 32.0–36.0)
MCV: 90.3 fL (ref 80.0–100.0)
MPV: 10.3 fL (ref 7.5–12.5)
Monocytes Relative: 4.7 %
Neutro Abs: 4147 cells/uL (ref 1500–7800)
Neutrophils Relative %: 46.6 %
Platelets: 251 10*3/uL (ref 140–400)
RBC: 4.83 10*6/uL (ref 3.80–5.10)
RDW: 13 % (ref 11.0–15.0)
Total Lymphocyte: 45.4 %
WBC: 8.9 10*3/uL (ref 3.8–10.8)

## 2022-11-15 LAB — TSH: TSH: 1.15 mIU/L

## 2022-11-15 LAB — URINALYSIS, ROUTINE W REFLEX MICROSCOPIC
Bilirubin Urine: NEGATIVE
Glucose, UA: NEGATIVE
Hgb urine dipstick: NEGATIVE
Ketones, ur: NEGATIVE
Leukocytes,Ua: NEGATIVE
Nitrite: NEGATIVE
Protein, ur: NEGATIVE
Specific Gravity, Urine: 1.031 (ref 1.001–1.035)
pH: 6 (ref 5.0–8.0)

## 2022-11-15 LAB — LIPID PANEL
Cholesterol: 118 mg/dL (ref <200)
HDL: 44 mg/dL — ABNORMAL LOW (ref >=50)
LDL Cholesterol (Calc): 60 mg/dL (calc)
Non-HDL Cholesterol (Calc): 74 mg/dL (calc) (ref <130)
Total CHOL/HDL Ratio: 2.7 (calc) (ref <5.0)
Triglycerides: 60 mg/dL (ref <150)

## 2022-11-15 LAB — MICROALBUMIN / CREATININE URINE RATIO
Creatinine, Urine: 254 mg/dL (ref 20–275)
Microalb Creat Ratio: 4 mg/g creat (ref <30)
Microalb, Ur: 1.1 mg/dL

## 2022-11-15 LAB — COMPREHENSIVE METABOLIC PANEL
AG Ratio: 1.9 (calc) (ref 1.0–2.5)
ALT: 18 U/L (ref 6–29)
AST: 18 U/L (ref 10–35)
Albumin: 4.2 g/dL (ref 3.6–5.1)
Alkaline phosphatase (APISO): 69 U/L (ref 37–153)
BUN: 14 mg/dL (ref 7–25)
CO2: 25 mmol/L (ref 20–32)
Calcium: 9.5 mg/dL (ref 8.6–10.4)
Chloride: 106 mmol/L (ref 98–110)
Creat: 0.79 mg/dL (ref 0.50–1.03)
Globulin: 2.2 g/dL (calc) (ref 1.9–3.7)
Glucose, Bld: 81 mg/dL (ref 65–99)
Potassium: 3.8 mmol/L (ref 3.5–5.3)
Sodium: 142 mmol/L (ref 135–146)
Total Bilirubin: 0.6 mg/dL (ref 0.2–1.2)
Total Protein: 6.4 g/dL (ref 6.1–8.1)

## 2022-11-15 LAB — HEMOGLOBIN A1C
Hgb A1c MFr Bld: 4.9 % of total Hgb (ref <5.7)
Mean Plasma Glucose: 94 mg/dL
eAG (mmol/L): 5.2 mmol/L

## 2022-11-15 LAB — VITAMIN B12: Vitamin B-12: 377 pg/mL (ref 200–1100)

## 2022-11-25 ENCOUNTER — Other Ambulatory Visit: Payer: Self-pay | Admitting: Family Medicine

## 2022-12-02 ENCOUNTER — Encounter: Payer: Self-pay | Admitting: Family Medicine

## 2022-12-02 ENCOUNTER — Ambulatory Visit: Payer: BC Managed Care – PPO | Admitting: Family Medicine

## 2022-12-02 VITALS — BP 111/77 | HR 72 | Temp 98.2°F | Ht 70.0 in | Wt 229.8 lb

## 2022-12-02 DIAGNOSIS — R059 Cough, unspecified: Secondary | ICD-10-CM | POA: Diagnosis not present

## 2022-12-02 DIAGNOSIS — J029 Acute pharyngitis, unspecified: Secondary | ICD-10-CM | POA: Diagnosis not present

## 2022-12-02 LAB — POCT RAPID STREP A (OFFICE): Rapid Strep A Screen: POSITIVE — AB

## 2022-12-02 LAB — POC COVID19 BINAXNOW: SARS Coronavirus 2 Ag: NEGATIVE

## 2022-12-02 MED ORDER — PROMETHAZINE-DM 6.25-15 MG/5ML PO SYRP: 5.0000 mL | ORAL_SOLUTION | Freq: Four times a day (QID) | ORAL | 0 refills | Status: DC | PRN

## 2022-12-02 MED ORDER — AZITHROMYCIN 250 MG PO TABS: ORAL_TABLET | ORAL | 0 refills | Status: DC

## 2022-12-02 NOTE — Progress Notes (Signed)
   Tonya Suarez is a 52 y.o. female who presents today for an office visit.  Assessment/Plan:  Cough / Sore Throat  Rapid strep positive.  She has amoxicillin allergy.  She has faint rhonchi on exam likely related to sinusitis.  Will treat with course of azithromycin.  Start promethazine-dextromethorphan cough syrup.  Encouraged hydration.  She can use over-the-counter meds as needed.  We discussed reasons to return to care.  Follow-up as needed.    Subjective:  HPI:  Patient here today with cough and sore throat.  Symptoms started about 5 days ago. Some chills. No fever. Some shortness of breath. Some wheezing. Tried mucinex which helped some. Daughter has been sick with similar symptoms.  Mild sore throat.       Objective:  Physical Exam: Temp 98.2 F (36.8 C) (Temporal)   Ht 5\' 10"  (1.778 m)   Wt 229 lb 12.8 oz (104.2 kg)   LMP 06/05/2018 Comment: BTL  BMI 32.97 kg/m   Gen: No acute distress, resting comfortably HEENT: TMs with clear effusion bilaterally.  OP erythematous.  Nasal mucosa erythematous and boggy bilaterally CV: Regular rate and rhythm with no murmurs appreciated Pulm: Normal work of breathing, scattered faint rhonchi throughout all lung fields.  No wheezes. Neuro: Grossly normal, moves all extremities Psych: Normal affect and thought content      Kaya Klausing M. Jimmey Ralph, MD 12/02/2022 9:01 AM

## 2022-12-02 NOTE — Patient Instructions (Addendum)
It was very nice to see you today!  Your strep test is positive.  Please start azithromycin.  Start the cough syrup.  Make sure that you are getting plenty of fluids.  Let us know if your symptoms are not improving.  Return if symptoms worsen or fail to improve.   Take care, Dr Jimmey Ralph  PLEASE NOTE:  If you had any lab tests, please let us know if you have not heard back within a few days. You may see your results on mychart before we have a chance to review them but we will give you a call once they are reviewed by [place].   If we ordered any referrals today, please let us know if you have not heard from their office within the next week.   If you had any urgent prescriptions sent in today, please check with the pharmacy within an hour of our visit to make sure the prescription was transmitted appropriately.   Please try these tips to maintain a healthy lifestyle:  Eat at least 3 REAL meals and 1-2 snacks per day.  Aim for no more than 5 hours between eating.  If you eat breakfast, please do so within one hour of getting up.   Each meal should contain half fruits/vegetables, one quarter protein, and one quarter carbs (no bigger than a computer mouse)  Cut down on sweet beverages. This includes juice, soda, and sweet tea.   Drink at least 1 glass of water with each meal and aim for at least 8 glasses per day  Exercise at least 150 minutes every week.

## 2022-12-05 ENCOUNTER — Encounter: Payer: Self-pay | Admitting: Family Medicine

## 2022-12-23 NOTE — Progress Notes (Deleted)
   Rubin Payor, PhD, LAT, ATC acting as a scribe for Clementeen Graham, MD.  Tonya Suarez is a 52 y.o. female who presents to [facility] at [facility] today for bilateral hip pain. Her last visit w/ Dr. Denyse Amass for her R hip was on 06/16/22 and she was referred to PT, but never scheduled any visits.   Today, pt c/o bilat hip pain x ***  Dx imaging: 08/02/20 L femur MRI 07/11/20 R hip MRI 06/25/20 R hip XR  Pertinent review of systems: ***  Relevant historical information: ***   Exam:  LMP 06/05/2018 Comment: BTL General: Well Developed, well nourished, and in no acute distress.   MSK: ***    Lab and Radiology Results No results found for this or any previous visit (from the past 72 hour(s)). No results found.     Assessment and Plan: 52 y.o. female with ***   PDMP not reviewed this encounter. No orders of the defined types were placed in this encounter.  No orders of the defined types were placed in this encounter.    Discussed warning signs or symptoms. Please see discharge instructions. Patient expresses understanding.   ***

## 2022-12-26 ENCOUNTER — Ambulatory Visit: Payer: BC Managed Care – PPO | Admitting: Family Medicine

## 2022-12-27 ENCOUNTER — Other Ambulatory Visit: Payer: Self-pay | Admitting: Podiatry

## 2022-12-29 ENCOUNTER — Encounter: Payer: Self-pay | Admitting: Family Medicine

## 2022-12-29 ENCOUNTER — Ambulatory Visit: Payer: BC Managed Care – PPO | Admitting: Family Medicine

## 2022-12-29 ENCOUNTER — Ambulatory Visit: Payer: BC Managed Care – PPO

## 2022-12-29 ENCOUNTER — Other Ambulatory Visit: Payer: Self-pay

## 2022-12-29 VITALS — BP 110/82 | HR 73 | Ht 70.0 in | Wt 231.0 lb

## 2022-12-29 DIAGNOSIS — M25551 Pain in right hip: Secondary | ICD-10-CM

## 2022-12-29 DIAGNOSIS — M25552 Pain in left hip: Secondary | ICD-10-CM

## 2022-12-29 DIAGNOSIS — M7062 Trochanteric bursitis, left hip: Secondary | ICD-10-CM | POA: Diagnosis not present

## 2022-12-29 NOTE — Progress Notes (Signed)
I, Stevenson Clinch, CMA acting as a scribe for Clementeen Graham, MD.  Tonya Suarez is a 52 y.o. female who presents to [facility] at [facility] today for bilateral hip pain. Her last visit w/ Dr. Denyse Amass for her R hip was on 06/16/22 and she was referred to PT, but never scheduled any visits.   Today, pt c/o bilat hip pain x 2 months. Less pain in the right hip, more pain in the left hip. Sx worse with left side-lying. Sx also flare up with prolonged time sitting and sitting in a lower chair. Notes some pain radiating into the groin and left leg. Notes doing PT at home. Occasional weakness in the legs. Also notes chronic LBP. Continues taking Meloxicam. Takes Gabapentin prn.   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   Exam:  BP 110/82   Pulse 73   Ht 5\' 10"  (1.778 m)   Wt 231 lb (104.8 kg)   LMP 06/05/2018 Comment: BTL  SpO2 97%   BMI 33.15 kg/m  General: Well Developed, well nourished, and in no acute distress.   MSK: Left hip normal-appearing Tender palpation greater trochanter.  Hip external rotation strength is diminished and painful 4/5.  Abduction strength generally intact.    Lab and Radiology Results  Procedure: Real-time Ultrasound Guided Injection of the left hip greater trochanter bursa Device: Philips Affiniti 50G/GE Logiq Images permanently stored and available for review in PACS Verbal informed consent obtained.  Discussed risks and benefits of procedure. Warned about infection, bleeding, hyperglycemia damage to structures among others. Patient expresses understanding and agreement Time-out conducted.   Noted no overlying erythema, induration, or other signs of local infection.   Skin prepped in a sterile fashion.   Local anesthesia:  Topical Ethyl chloride.   With sterile technique and under real time ultrasound guidance: 40 mg of Kenalog and 2 mL of Marcaine injected into greater trochanter bursa. Fluid seen entering the bursa.   Completed without difficulty   Pain immediately resolved suggesting accurate placement of the medication.   Advised to call if fevers/chills, erythema, induration, drainage, or persistent bleeding.   Images permanently stored and available for review in the ultrasound unit.  Impression: Technically successful ultrasound guided injection.    X-ray images left hip obtained today personally and independently interpreted No severe arthritis.  No aggressive appearing bony lesions.  No acute fractures are present. Await formal radiology review     Assessment and Plan: 52 y.o. female with left lateral hip pain thought to be trochanteric bursitis.  She has had a similar problem on the right side in the past.  Plan for steroid injection greater trochanter bursa today.  Additionally work on home exercise program especially focusing on hip external rotation strength.  Consider formal physical therapy or even advanced imaging if needed in the future.  Today is an acute exacerbation of a chronic problem with uncertain prognosis. PDMP not reviewed this encounter. Orders Placed This Encounter  Procedures   [place] LIMITED JOINT SPACE STRUCTURES LOW BILAT(NO LINKED CHARGES)    Order Specific Question:   Reason for Exam (SYMPTOM  OR DIAGNOSIS REQUIRED)    Answer:   bilateral hip pain    Order Specific Question:   Preferred imaging location?    Answer:  [facility] HIP UNILAT W OR W/O PELVIS 2-3 VIEWS LEFT    Standing Status:   Future    Number of Occurrences:   1    Standing Expiration Date:   01/28/2023    Order Specific Question:   Reason for Exam (SYMPTOM  OR DIAGNOSIS REQUIRED)    Answer:   left hip pain    Order Specific Question:   Preferred imaging location?    Answer:    [facility]    Order Specific Question:   Is patient pregnant?    Answer:   No   No orders of the defined types were placed in this encounter.    Discussed warning signs or symptoms. Please see discharge instructions. Patient expresses understanding.   The above documentation has been reviewed and is accurate and complete Clementeen Graham, M.D.

## 2022-12-29 NOTE — Patient Instructions (Addendum)
Thank you for coming in today.  You received an injection today. Seek immediate medical attention if the joint becomes red, extremely painful, or is oozing fluid.  Please get an Xray today before you leave  Please work on the home exercises the athletic trainer went over with you:  View at [email or web address] using code: [email or web address]

## 2023-01-08 ENCOUNTER — Other Ambulatory Visit: Payer: Self-pay | Admitting: Family Medicine

## 2023-01-18 NOTE — Progress Notes (Signed)
Left hip x-ray looks normal to radiology.

## 2023-02-23 ENCOUNTER — Ambulatory Visit: Payer: Self-pay

## 2023-02-23 ENCOUNTER — Ambulatory Visit: Payer: No Typology Code available for payment source | Admitting: Family Medicine

## 2023-02-23 ENCOUNTER — Ambulatory Visit: Payer: No Typology Code available for payment source

## 2023-02-23 VITALS — BP 128/82 | HR 84 | Ht 70.0 in | Wt 235.0 lb

## 2023-02-23 DIAGNOSIS — M25511 Pain in right shoulder: Secondary | ICD-10-CM | POA: Diagnosis not present

## 2023-02-23 DIAGNOSIS — G8929 Other chronic pain: Secondary | ICD-10-CM

## 2023-02-23 NOTE — Progress Notes (Signed)
 LILLETTE Ileana Collet, PhD, LAT, ATC acting as a scribe for Artist Lloyd, MD.  Tonya Suarez is a 53 y.o. female who presents to [facility] at [facility] today for exacerbation of her R shoulder pain. Pt's last visit w/ Dr. Leonce for her R shoulder was on 10/07/22 and was given a repeat subacromial steroid injection.  Today, pt reports R shoulder pain returned about 2 wks ago. Pain is a bit different; now also having pain on the R-side of her neck and mid-back along the bra-strap line and into the rhomboid/periscapular region.   Dx imaging: 03/04/21 C-spine & R shoulder XR   Pertinent review of systems: No fevers or chills  Relevant historical information: Diabetes   Exam:  BP 128/82   Pulse 84   Ht 5' 10 (1.778 m)   Wt 235 lb (106.6 kg)   LMP 06/05/2018 Comment: BTL  SpO2 95%   BMI 33.72 kg/m  General: Well Developed, well nourished, and in no acute distress.   MSK: Right shoulder normal-appearing  Normal motion pain with abduction. Intact strength. Positive Hawkins and Neer's test. Positive empty can test. Negative Yergason's and speeds test.    Lab and Radiology Results  Procedure: Real-time Ultrasound Guided Injection of right shoulder subacromial bursa Device: Philips Affiniti 50G/GE Logiq Images permanently stored and available for review in PACS Verbal informed consent obtained.  Discussed risks and benefits of procedure. Warned about infection, bleeding, hyperglycemia damage to structures among others. Patient expresses understanding and agreement Time-out conducted.   Noted no overlying erythema, induration, or other signs of local infection.   Skin prepped in a sterile fashion.   Local anesthesia: Topical Ethyl chloride.   With sterile technique and under real time ultrasound guidance: 40 mg of Kenalog  and 2 mL of Marcaine  injected into subacromial bursa. Fluid seen entering the bursa.   Completed without difficulty   Pain  immediately resolved suggesting accurate placement of the medication.   Advised to call if fevers/chills, erythema, induration, drainage, or persistent bleeding.   Images permanently stored and available for review in the ultrasound unit.  Impression: Technically successful ultrasound guided injection.   Xray right shoulder obtained today personally and independently interpreted No acute fractures.  No severe degeneration. Await formal radiology review    Assessment and Plan: 53 y.o. female with acute exacerbation of chronic right shoulder pain.  I have been seeing her for this issue for the last 2 years she.  She has had multiple injections and trials of physical therapy.  This all helps but does not provide lasting benefit.  Plan for repeat injection today and x-ray and MRI. Recheck after MRI.   PDMP not reviewed this encounter. Orders Placed This Encounter  Procedures   [place]  LIMITED JOINT SPACE STRUCTURES UP RIGHT(NO LINKED CHARGES)    Reason for Exam (SYMPTOM  OR DIAGNOSIS REQUIRED):   right shoulder pain    Preferred imaging location?:   [facility] Shoulder Right    Standing Status:   Future    Expiration Date:   02/23/2024    Reason for Exam (SYMPTOM  OR DIAGNOSIS REQUIRED):   right shoulder pain    Preferred imaging location?:   [facility]    Is patient pregnant?:   No   MR SHOULDER RIGHT WO CONTRAST    Standing Status:   Future    Expiration Date:   02/23/2024    What is the patient's sedation requirement?:  No Sedation    Does the patient have a pacemaker or implanted devices?:   No    Preferred imaging location?:   [facility] (table limit-550lbs)   No orders of the defined types were placed in this encounter.    Discussed warning signs or symptoms. Please see discharge instructions. Patient expresses understanding.   The above documentation has been reviewed and is accurate and complete Artist Lloyd, M.D.

## 2023-02-23 NOTE — Patient Instructions (Addendum)
 Thank you for coming in today.   You received an injection today. Seek immediate medical attention if the joint becomes red, extremely painful, or is oozing fluid.   Please get an Xray today before you leave   You should hear from MRI scheduling within 1 week. If you do not hear please let me know.    Check back after we get the MRI results.

## 2023-02-24 ENCOUNTER — Other Ambulatory Visit: Payer: No Typology Code available for payment source

## 2023-02-24 ENCOUNTER — Encounter: Payer: Self-pay | Admitting: Family Medicine

## 2023-02-26 ENCOUNTER — Ambulatory Visit
Admission: RE | Admit: 2023-02-26 | Discharge: 2023-02-26 | Discharge status: Home / Self Care | Payer: No Typology Code available for payment source | Source: Ambulatory Visit | Attending: Family Medicine | Admitting: Family Medicine

## 2023-02-26 ENCOUNTER — Other Ambulatory Visit: Payer: Self-pay | Admitting: Family Medicine

## 2023-02-26 DIAGNOSIS — I10 Essential (primary) hypertension: Secondary | ICD-10-CM

## 2023-02-26 DIAGNOSIS — G8929 Other chronic pain: Secondary | ICD-10-CM

## 2023-03-06 NOTE — Progress Notes (Signed)
Right shoulder x-ray shows mild arthritis

## 2023-03-06 NOTE — Progress Notes (Signed)
 Right shoulder MRI shows medium tendinitis and a partial tear of the supraspinatus tendon.  This is the tendon that alleged to be your arm above your head and medium tendinitis of the infraspinatus tendon which allows you to rotate your arm to the outside.   This could benefit from surgery.  Recommend return to clinic to go over the results in full detail.

## 2023-03-07 ENCOUNTER — Ambulatory Visit (INDEPENDENT_AMBULATORY_CARE_PROVIDER_SITE_OTHER): Payer: No Typology Code available for payment source | Admitting: Family Medicine

## 2023-03-07 VITALS — BP 128/82 | HR 84 | Ht 70.0 in | Wt 235.0 lb

## 2023-03-07 DIAGNOSIS — M25511 Pain in right shoulder: Secondary | ICD-10-CM | POA: Diagnosis not present

## 2023-03-07 DIAGNOSIS — G8929 Other chronic pain: Secondary | ICD-10-CM | POA: Diagnosis not present

## 2023-03-07 NOTE — Progress Notes (Signed)
   LILLETTE Ileana Collet, PhD, LAT, ATC acting as a scribe for Artist Lloyd, MD.  Tonya Suarez is a 53 y.o. female who presents to [facility] at [facility] today for f/u R shoulder pain w/ MRI review. Pt was last seen by Dr. Lloyd on 02/23/23 and was given a R subacromial steroid injection and a MRI was ordered.   Today, pt reports R shoulder pain is about the same. No relief from prior steroid injection.  Dx imaging: 02/26/23 R shoulder MRI 02/23/23 R shoulder XR 03/04/21 C-spine & R shoulder XR   Pertinent review of systems: No fevers or chills  Relevant historical information: Hypertension   Exam:  BP 128/82   Pulse 84   Ht 5' 10 (1.778 m)   Wt 235 lb (106.6 kg)   LMP 06/05/2018 Comment: BTL  SpO2 95%   BMI 33.72 kg/m  General: Well Developed, well nourished, and in no acute distress.   MSK: Right shoulder normal-appearing pain with abduction intact strength.    Lab and Radiology Results  EXAM: MRI OF THE RIGHT SHOULDER WITHOUT CONTRAST   TECHNIQUE: Multiplanar, multisequence MR imaging of the shoulder was performed. No intravenous contrast was administered.   COMPARISON:  None Available.   FINDINGS: Rotator cuff: Moderate supraspinatus tendinosis with a small partial-thickness bursal surface tear anteriorly. Moderate infraspinatus tendinosis. Teres minor tendon is intact. Subscapularis tendon is intact.   Muscles: No muscle atrophy or edema. No intramuscular fluid collection or hematoma.   Biceps Long Head: Intraarticular and extraarticular portions of the biceps tendon are intact.   Acromioclavicular Joint: Mild a moderate arthropathy of the acromioclavicular joint. Trace subacromial/subdeltoid bursal fluid.   Glenohumeral Joint: No joint effusion. Mild partial-thickness cartilage loss of the glenohumeral joint.   Labrum: Grossly intact, but evaluation is limited by lack of intraarticular fluid/contrast.   Bones: No fracture or  dislocation. No aggressive osseous lesion.   Other: No fluid collection or hematoma.   IMPRESSION: 1. Moderate supraspinatus tendinosis with a small partial-thickness bursal surface tear anteriorly. 2. Moderate infraspinatus tendinosis.     Electronically Signed   By: Julaine Blanch M.D.   On: 03/04/2023 07:54 I, Artist Lloyd, personally (independently) visualized and performed the interpretation of the images attached in this note.     Assessment and Plan: 53 y.o. female with chronic right shoulder pain due to small bursal sided partial-thickness tear supraspinatus tendon and tendinitis of the supraspinatus and infraspinatus.  She did not have much benefit from a steroid injection and has been doing physical therapy exercises during the duration of her pain all with mediocre benefit.  Plan to refer to orthopedic surgery for surgical consultation.   PDMP not reviewed this encounter. Orders Placed This Encounter  Procedures   Ambulatory referral to Orthopedic Surgery    Referral Priority:   Routine    Referral Type:   Surgical    Referral Reason:   Specialty Services Required    Referred to Provider:   Genelle Standing, MD    Requested Specialty:   Orthopedic Surgery    Number of Visits Requested:   1   No orders of the defined types were placed in this encounter.    Discussed warning signs or symptoms. Please see discharge instructions. Patient expresses understanding.   The above documentation has been reviewed and is accurate and complete Artist Lloyd, M.D.

## 2023-03-07 NOTE — Patient Instructions (Signed)
 Thank you for coming in today.   You should hear from Dr Steward Drone soon.   Let me know if you have any problems.

## 2023-03-10 ENCOUNTER — Other Ambulatory Visit: Payer: Self-pay | Admitting: Family Medicine

## 2023-03-13 ENCOUNTER — Telehealth: Payer: Self-pay | Admitting: Family Medicine

## 2023-03-13 NOTE — Telephone Encounter (Signed)
 Copied from CRM [id number]. Topic: Clinical - Prescription Issue >> Mar 13, 2023  1:34 PM Gurney Maxin H wrote: Reason for CRM: Patient needs prior authorization for the Semaglutide,0.25 or 0.5MG /DOS, (OZEMPIC, 0.25 OR 0.5 MG/DOSE,) 2 MG/3ML SOPN

## 2023-03-14 ENCOUNTER — Other Ambulatory Visit (HOSPITAL_COMMUNITY): Payer: Self-pay

## 2023-03-14 ENCOUNTER — Telehealth: Payer: Self-pay

## 2023-03-14 NOTE — Telephone Encounter (Signed)
See below

## 2023-03-14 NOTE — Telephone Encounter (Signed)
Pharmacy Patient Advocate Encounter   Received notification from Pt Calls Messages that prior authorization for Ozempic 2mg /77ml is required/requested.   Insurance verification completed.   The patient is insured through [facility] .   Per test claim: PA required; PA submitted to above mentioned insurance via CoverMyMeds Key/confirmation #/EOC [id number] Status is pending

## 2023-03-17 ENCOUNTER — Other Ambulatory Visit (HOSPITAL_COMMUNITY): Payer: Self-pay

## 2023-03-17 NOTE — Telephone Encounter (Signed)
Pharmacy Patient Advocate Encounter  Received notification from [facility] that Prior Authorization for Ozempic 2mg /83ml has been APPROVED from 02/22/23 to 02/22/24   PA #/Case ID/Reference #:  [id number]  Left a voicemail at [facility] to notify of the approval

## 2023-03-21 ENCOUNTER — Other Ambulatory Visit: Payer: Self-pay | Admitting: Family Medicine

## 2023-03-21 ENCOUNTER — Ambulatory Visit (INDEPENDENT_AMBULATORY_CARE_PROVIDER_SITE_OTHER): Payer: No Typology Code available for payment source | Admitting: Family Medicine

## 2023-03-21 ENCOUNTER — Other Ambulatory Visit: Payer: Self-pay

## 2023-03-21 VITALS — BP 112/74 | HR 96 | Ht 70.0 in | Wt 230.0 lb

## 2023-03-21 DIAGNOSIS — Z780 Asymptomatic menopausal state: Secondary | ICD-10-CM

## 2023-03-21 DIAGNOSIS — M25532 Pain in left wrist: Secondary | ICD-10-CM

## 2023-03-21 MED ORDER — PREDNISONE 50 MG PO TABS: 50.0000 mg | ORAL_TABLET | Freq: Every day | ORAL | 0 refills | Status: DC

## 2023-03-21 NOTE — Progress Notes (Signed)
Rubin Payor, PhD, LAT, ATC acting as a scribe for Clementeen Graham, MD.  Tonya Suarez is a 53 y.o. female who presents to [facility] at [facility] today for L wrist pain. Pt was previously seen by Dr. Denyse Amass on 03/07/23 for R shoulder pain.  Today, pt c/o L wrist pain ongoing for about 1-2 wks. She was doing some repetitive work on the [employer], lifting and moving objects. Pt locates pain to the ulnar aspect of the L forearm and into the wrist, and hand. Pain is disturbing her sleep. Swelling present and TTP.  Radiates: yes Paresthesia: yes- 4th-5th Grip strength: trying to not use L hand Aggravates: gripping motions Treatments tried: heat, ice, Voltaren gel, gabapentin, meloxicam  Pertinent review of systems: No fevers or chills  Relevant historical information: Hypertension and diabetes.   Exam:  BP 112/74   Pulse 96   Ht 5\' 10"  (1.778 m)   Wt 230 lb (104.3 kg)   LMP 06/05/2018 Comment: BTL  SpO2 96%   BMI 33.00 kg/m  General: Well Developed, well nourished, and in no acute distress.   MSK: Left wrist some swelling left volar forearm.  Tender palpation across forearm and wrist especially in the ulnar aspect of the forearm.  Positive Tinel's at [facility] canal ulnar wrist.  Mildly positive Tinel's at cubital tunnel. Normal wrist strength.  Some pain with resisted wrist extension and ulnar deviation.  Pulses capillary fill and sensation are intact distally.    Lab and Radiology Results  Diagnostic Limited MSK Ultrasound of: Left wrist ulnar aspect Extensor carpi ulnaris tendon visualized without tear.  Hypoechoic fluid tracks around tendon within tendon sheath. No significant abnormality at flexor carpi ulnaris tendon. Direct pressure at ulnar nerve at ulnar wrist and volar ulnar forearm reproduce paresthesias into her hand. Cubital canal is relatively normal-appearing but pressure with ultrasound probe at the ulnar nerve at this region does  reproduce her paresthesias distally. Impression: ECU tenosynovitis and ulnar nerve irritation forearm and elbow and wrist.     Assessment and Plan: 53 y.o. female with patient has overuse related wrist pain and paresthesias.  She has components of cubital tunnel syndrome as well and irritation of the ulnar nerve more distally at the Guyon's canal and forearm.  Plan for relative rest and immobilization with a thumb spica wrist splint and a cubital tunnel night brace.  Also will prescribe a short course of prednisone.  If not improved consider hand therapy.  Additionally of note patient is postmenopausal and has not had a bone density test.  Will go ahead and order a DEXA scan.   PDMP not reviewed this encounter. Orders Placed This Encounter  Procedures   [place] LIMITED JOINT SPACE STRUCTURES UP LEFT(NO LINKED CHARGES)    Reason for Exam (SYMPTOM  OR DIAGNOSIS REQUIRED):   left wrist pain    Preferred imaging location?:   [facility]   DG BONE DENSITY (DXA)    Standing Status:   Future    Expiration Date:   03/20/2024    Reason for Exam (SYMPTOM  OR DIAGNOSIS REQUIRED):   eval bone density    Is patient pregnant?:   No    Preferred imaging location?:   [address]   Meds ordered this encounter  Medications   predniSONE (DELTASONE) 50 MG tablet    Sig: Take 1 tablet (50 mg total) by mouth daily.    Dispense:  5 tablet    Refill:  0  Discussed warning signs or symptoms. Please see discharge instructions. Patient expresses understanding.   The above documentation has been reviewed and is accurate and complete Clementeen Graham, M.D.

## 2023-03-21 NOTE — Patient Instructions (Signed)
Thank you for coming in today.   Use a thumb spice wrist brace and a cubital tunnel elbow brace or splint.   This should improve shortly.   If not better hand therapy could help.   Try the prednisone.

## 2023-03-22 ENCOUNTER — Ambulatory Visit (HOSPITAL_BASED_OUTPATIENT_CLINIC_OR_DEPARTMENT_OTHER): Payer: Self-pay | Admitting: Orthopaedic Surgery

## 2023-03-22 ENCOUNTER — Other Ambulatory Visit (HOSPITAL_BASED_OUTPATIENT_CLINIC_OR_DEPARTMENT_OTHER): Payer: Self-pay

## 2023-03-22 ENCOUNTER — Ambulatory Visit (HOSPITAL_BASED_OUTPATIENT_CLINIC_OR_DEPARTMENT_OTHER): Payer: No Typology Code available for payment source | Admitting: Orthopaedic Surgery

## 2023-03-22 DIAGNOSIS — M7541 Impingement syndrome of right shoulder: Secondary | ICD-10-CM

## 2023-03-22 MED ORDER — ASPIRIN 325 MG PO TBEC: 325.0000 mg | DELAYED_RELEASE_TABLET | Freq: Every day | ORAL | 0 refills | Status: DC

## 2023-03-22 MED ORDER — OXYCODONE HCL 5 MG PO TABS: 5.0000 mg | ORAL_TABLET | ORAL | 0 refills | Status: DC | PRN

## 2023-03-22 MED ORDER — IBUPROFEN 800 MG PO TABS: 800.0000 mg | ORAL_TABLET | Freq: Three times a day (TID) | ORAL | 0 refills | Status: AC

## 2023-03-22 MED ORDER — ACETAMINOPHEN 500 MG PO TABS: 500.0000 mg | ORAL_TABLET | Freq: Three times a day (TID) | ORAL | 0 refills | Status: AC

## 2023-03-22 NOTE — Telephone Encounter (Signed)
Last OV 03/21/23 Next OV not scheduled  Last refill 10/29/21 Qty #180/1

## 2023-03-22 NOTE — Progress Notes (Signed)
Chief Complaint: Right shoulder pain     History of Present Illness:    Tonya Suarez is a 53 y.o. female right-hand-dominant female presents with ongoing right shoulder pain for the last 2 years.  She has been participating in physical therapy per recommendation of Dr. Denyse Amass.  She has also been having injections approximately 2/year which has given her temporary relief.  This time she is still having persistent pain with overhead activity and range of motion.  She has having a hard time laying directly on the side.  She has been on long-term Mobic as result of this.  She is taking muscle relaxer as needed which is somewhat helpful.  At this point she remains quite limited.    PMH/PSH/Family History/Social History/Meds/Allergies:    Past Medical History:  Diagnosis Date   Abnormal Pap smear of cervix    ascus hpv hr neg   Allergy    Arthritis    Chronic headaches    Depression    Diabetes mellitus without complication (HCC)    GERD (gastroesophageal reflux disease)    Hypertension    Low back pain    UTI (urinary tract infection)    Past Surgical History:  Procedure Laterality Date   CERVICAL BIOPSY  W/ LOOP ELECTRODE EXCISION     COLPOSCOPY     CIN2/3   TUBAL LIGATION     Social History   Socioeconomic History   Marital status: Married    Spouse name: Not on file   Number of children: Not on file   Years of education: Not on file   Highest education level: Not on file  Occupational History   Not on file  Tobacco Use   Smoking status: Former    Types: Cigars   Smokeless tobacco: Never   Tobacco comments:    black and mild one per day  Vaping Use   Vaping status: Never Used  Substance and Sexual Activity   Alcohol use: Yes    Alcohol/week: 0.0 - 2.0 standard drinks of alcohol    Comment: occ   Drug use: No   Sexual activity: Yes    Partners: Male    Birth control/protection: Surgical, Post-menopausal    Comment: tubal ligation  Other Topics  Concern   Not on file  Social History Narrative   Married May 13th 2017. 2 children- son 92 (married) and daughter 53. Granddaughter august 2017.       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      Hobbies: time with family   Social Drivers of Corporate investment banker Strain: Not on file  Food Insecurity: Not on file  Transportation Needs: Not on file  Physical Activity: Not on file  Stress: Not on file  Social Connections: Unknown (08/03/2021)   Received from [facility], [facility]   Social Network    Social Network: Not on file   Family History  Problem Relation Age of Onset   Colon polyps Mother    Diabetes Mother    Hypertension Mother    Hyperlipidemia Mother    Kidney disease Mother        has fistula- could need dialysis at any time   Colon polyps Father    Diabetes Father        mother   Hyperlipidemia Father        mother   Hypertension Father        mother  Colon cancer Father        69s, and grandmother   Lung cancer Father        in 59s   Cancer - Other Sister        duodenal   Stomach cancer Sister    Colon cancer Maternal Aunt    Colon cancer Paternal Grandmother    Esophageal cancer Neg Hx    Liver cancer Neg Hx    Pancreatic cancer Neg Hx    Rectal cancer Neg Hx    Allergies  Allergen Reactions   Amoxicillin Swelling    Lip swelling   Current Outpatient Medications  Medication Sig Dispense Refill   acetaminophen (TYLENOL) 500 MG tablet Take 1 tablet (500 mg total) by mouth every 8 (eight) hours for 10 days. 30 tablet 0   aspirin EC 325 MG tablet Take 1 tablet (325 mg total) by mouth daily. 14 tablet 0   ibuprofen (ADVIL) 800 MG tablet Take 1 tablet (800 mg total) by mouth every 8 (eight) hours for 10 days. Please take with food, please alternate with acetaminophen 30 tablet 0   oxyCODONE (ROXICODONE) 5 MG immediate release tablet Take 1 tablet (5 mg total) by mouth every 4 (four) hours as needed for  severe pain (pain score 7-10) or breakthrough pain. 15 tablet 0   amLODipine (NORVASC) 2.5 MG tablet TAKE 1 TABLET BY MOUTH EVERY DAY 90 tablet 3   azelastine (ASTELIN) 0.1 % nasal spray Place 2 sprays into both nostrils 2 (two) times daily. 90 mL 3   clotrimazole-betamethasone (LOTRISONE) cream APPLY TO AFFECTED AREA TWICE A DAY 45 g 1   Continuous Blood Gluc Sensor (FREESTYLE LIBRE 3 SENSOR) MISC Quantity: 2 sensors/month NDC# [phone] Sensor Refills: PRN or 12 refills annually 2 each 12   diclofenac Sodium (VOLTAREN) 1 % GEL Apply 4 g topically 4 (four) times daily. To affected joint. 100 g 11   fluticasone (FLONASE) 50 MCG/ACT nasal spray SPRAY 2 SPRAYS INTO EACH NOSTRIL EVERY DAY 48 mL 3   gabapentin (NEURONTIN) 300 MG capsule TAKE 2 CAPSULES BY MOUTH AT BEDTIME 180 capsule 1   hydrochlorothiazide (HYDRODIURIL) 25 MG tablet TAKE 1 TABLET (25 MG TOTAL) BY MOUTH DAILY. 90 tablet 3   ipratropium (ATROVENT) 0.03 % nasal spray PLACE 2 SPRAYS INTO BOTH NOSTRILS EVERY 12 (TWELVE) HOURS. 90 mL 2   meloxicam (MOBIC) 7.5 MG tablet TAKE 1 TABLET (7.5 MG TOTAL) BY MOUTH DAILY AS NEEDED. FOR PAIN 90 tablet 1   pantoprazole (PROTONIX) 40 MG tablet TAKE 1 TABLET BY MOUTH EVERY DAY 90 tablet 3   predniSONE (DELTASONE) 50 MG tablet Take 1 tablet (50 mg total) by mouth daily. 5 tablet 0   promethazine-dextromethorphan (PROMETHAZINE-DM) 6.25-15 MG/5ML syrup Take 5 mLs by mouth 4 (four) times daily as needed. 118 mL 0   rosuvastatin (CRESTOR) 10 MG tablet TAKE 1 TABLET (10 MG TOTAL) BY MOUTH ONCE A WEEK. 13 tablet 3   Semaglutide,0.25 or 0.5MG /DOS, (OZEMPIC, 0.25 OR 0.5 MG/DOSE,) 2 MG/3ML SOPN INJECT 0.25 MG AS DIRECTED ONCE A WEEK FOR 28 DAYS, THEN 0.5 MG ONCE A WEEK FOR 28 DAYS 3 mL 5   tiZANidine (ZANAFLEX) 4 MG tablet Take 0.5-1 tablets (2-4 mg total) by mouth every 8 (eight) hours as needed for muscle spasms. 30 tablet 2   No current facility-administered medications for this visit.   No results  found.  Review of Systems:   A ROS was performed including pertinent positives and negatives as documented  in the HPI.  Physical Exam :   Constitutional: NAD and appears stated age Neurological: Alert and oriented Psych: Appropriate affect and cooperative Last menstrual period 06/05/2018.   Comprehensive Musculoskeletal Exam:    Tenderness palpation about the biceps tendon as well as anterior superior greater tuberosity.  Forward elevation is 170 degrees equal to the contralateral side.  External rotation is to 50 degrees at the side.  Internal rotation is to L1 bilaterally.  Positive Neer impingement on the right.  There is positive speeds test.  Distal neurosensory exam is intact   Imaging:   Xray (3 views right shoulder): AC joint arthritis  MRI (right shoulder): There is arthritis involving the [facility] joint with evidence of significant subacromial impingement cystic changes of the humeral head as well as fluid around the biceps tendon consistent with biceps tear   I personally reviewed and interpreted the radiographs.   Assessment and Plan:   53 y.o. female with right shoulder rotator cuff tendinitis in the setting of subacromial spurring and an AC joint spur.  Overall I did describe that given the fact that she has tried multiple injections as well as physical therapy I do believe she would be a candidate for arthroscopic intervention.  Specifically I did discuss an acromioplasty with distal clavicle resection.  She is tender over the biceps as well and to that effect she did be significant evidence of biceps tendinitis I would recommend a subpectoral biceps tenodesis.  I did discuss the risks and limitations as well as associated rehab time.  At this time after consideration of all of this she is elected for surgical intervention  -Plan for right shoulder arthroscopy with subacromial debridement, distal clavicle resection, possible subpectoral biceps tenodesis   After a lengthy  discussion of treatment options, including risks, benefits, alternatives, complications of surgical and nonsurgical conservative options, the patient elected surgical repair.   The patient  is aware of the material risks  and complications including, but not limited to injury to adjacent structures, neurovascular injury, infection, numbness, bleeding, implant failure, thermal burns, stiffness, persistent pain, failure to heal, disease transmission from allograft, need for further surgery, dislocation, anesthetic risks, blood clots, risks of death,and others. The probabilities of surgical success and failure discussed with patient given their particular co-morbidities.The time and nature of expected rehabilitation and recovery was discussed.The patient's questions were all answered preoperatively.  No barriers to understanding were noted. I explained the natural history of the disease process and Rx rationale.  I explained to the patient what I considered to be reasonable expectations given their personal situation.  The final treatment plan was arrived at through a shared patient decision making process model.   I personally saw and evaluated the patient, and participated in the management and treatment plan.  Huel Cote, MD Attending Physician, Orthopedic Surgery  This document was dictated using Dragon voice recognition software. A reasonable attempt at proof reading has been made to minimize errors.

## 2023-03-28 ENCOUNTER — Telehealth: Payer: Self-pay | Admitting: Orthopaedic Surgery

## 2023-03-28 ENCOUNTER — Telehealth: Payer: Self-pay | Admitting: Family Medicine

## 2023-03-28 NOTE — Telephone Encounter (Signed)
 I spoke with the patient today and scheduled her for right shoulder surgery on 05/23/23. Patient is requesting a note to provide to work stating she is having surgery on 05/23/23. Please provide estimated out of work period as well. Patient request you put note in My Chart for her once available.

## 2023-03-28 NOTE — Telephone Encounter (Signed)
 Received faxed document Surgical Clearance, to be filled out by provider. Patient requested to send it back via Fax Document is located in providers tray at front office.Please advise .

## 2023-03-29 ENCOUNTER — Encounter (HOSPITAL_BASED_OUTPATIENT_CLINIC_OR_DEPARTMENT_OTHER): Payer: Self-pay

## 2023-03-29 ENCOUNTER — Encounter: Payer: Self-pay | Admitting: Family Medicine

## 2023-03-29 ENCOUNTER — Other Ambulatory Visit (HOSPITAL_BASED_OUTPATIENT_CLINIC_OR_DEPARTMENT_OTHER): Payer: Self-pay | Admitting: Orthopaedic Surgery

## 2023-03-29 ENCOUNTER — Encounter (HOSPITAL_BASED_OUTPATIENT_CLINIC_OR_DEPARTMENT_OTHER): Payer: Self-pay | Admitting: Orthopaedic Surgery

## 2023-03-29 DIAGNOSIS — M7541 Impingement syndrome of right shoulder: Secondary | ICD-10-CM

## 2023-03-29 NOTE — Telephone Encounter (Signed)
 I sent her MyChart-I need some more information from her

## 2023-03-29 NOTE — Telephone Encounter (Signed)
 Form in your to sign folder

## 2023-03-30 ENCOUNTER — Inpatient Hospital Stay: Admission: RE | Admit: 2023-03-30 | Payer: No Typology Code available for payment source | Source: Ambulatory Visit

## 2023-05-05 ENCOUNTER — Other Ambulatory Visit: Payer: Self-pay

## 2023-05-05 ENCOUNTER — Emergency Department (HOSPITAL_COMMUNITY)

## 2023-05-05 ENCOUNTER — Encounter (HOSPITAL_COMMUNITY): Payer: Self-pay | Admitting: *Deleted

## 2023-05-05 ENCOUNTER — Emergency Department (HOSPITAL_COMMUNITY)
Admission: EM | Admit: 2023-05-05 | Discharge: 2023-05-05 | Disposition: A | Discharge status: Home / Self Care | Attending: Emergency Medicine | Admitting: Emergency Medicine

## 2023-05-05 DIAGNOSIS — R509 Fever, unspecified: Secondary | ICD-10-CM | POA: Diagnosis present

## 2023-05-05 DIAGNOSIS — Z7982 Long term (current) use of aspirin: Secondary | ICD-10-CM | POA: Insufficient documentation

## 2023-05-05 DIAGNOSIS — J069 Acute upper respiratory infection, unspecified: Secondary | ICD-10-CM | POA: Insufficient documentation

## 2023-05-05 LAB — BASIC METABOLIC PANEL
Anion gap: 11 (ref 5–15)
BUN: 12 mg/dL (ref 6–20)
CO2: 23 mmol/L (ref 22–32)
Calcium: 9.6 mg/dL (ref 8.9–10.3)
Chloride: 105 mmol/L (ref 98–111)
Creatinine, Ser: 0.97 mg/dL (ref 0.44–1.00)
GFR, Estimated: >60 mL/min (ref >60)
Glucose, Bld: 103 mg/dL — ABNORMAL HIGH (ref 70–99)
Potassium: 3.4 mmol/L — ABNORMAL LOW (ref 3.5–5.1)
Sodium: 139 mmol/L (ref 135–145)

## 2023-05-05 LAB — CBC
HCT: 42.3 % (ref 36.0–46.0)
Hemoglobin: 15.4 g/dL — ABNORMAL HIGH (ref 12.0–15.0)
MCH: 31.6 pg (ref 26.0–34.0)
MCHC: 36.4 g/dL — ABNORMAL HIGH (ref 30.0–36.0)
MCV: 86.9 fL (ref 80.0–100.0)
Platelets: 224 10*3/uL (ref 150–400)
RBC: 4.87 MIL/uL (ref 3.87–5.11)
RDW: 13.2 % (ref 11.5–15.5)
WBC: 8.1 10*3/uL (ref 4.0–10.5)
nRBC: 0 % (ref 0.0–0.2)

## 2023-05-05 LAB — RESP PANEL BY RT-PCR (RSV, FLU A&B, COVID)  RVPGX2
Influenza A by PCR: NEGATIVE
Influenza B by PCR: NEGATIVE
Resp Syncytial Virus by PCR: NEGATIVE
SARS Coronavirus 2 by RT PCR: NEGATIVE

## 2023-05-05 MED ORDER — ACETAMINOPHEN 500 MG PO TABS
1000.0000 mg | ORAL_TABLET | Freq: Once | ORAL | Status: AC
  Administered 2023-05-05: 1000 mg via ORAL
  Filled 2023-05-05: qty 2

## 2023-05-05 NOTE — ED Provider Triage Note (Signed)
 Emergency Medicine Provider Triage Evaluation Note  Tonya Suarez , a 53 y.o. female  was evaluated in triage.  Pt complains of bodyaches and chills, fever, sore throat, shortness of breath.  Symptoms began last night when she was at work.  States that she has been around her grandchild who has recently been diagnosed with flu, been around her mother who is also recently diagnosed with flu.  Denies nausea, vomiting and abdominal pain.  Review of Systems  Positive:  Negative:   Physical Exam  BP 124/71 (BP Location: Right Arm)   Pulse (!) 123   Temp (!) 102.3 F (39.1 C) (Oral)   Resp 18   LMP 06/05/2018 Comment: BTL  SpO2 100%  Gen:   Awake, no distress   Resp:  Normal effort  MSK:   Moves extremities without difficulty  Other:  Lung CTAB  Medical Decision Making  Medically screening exam initiated at 7:52 PM.  Appropriate orders placed.  Belicia Difatta was informed that the remainder of the evaluation will be completed by another provider, this initial triage assessment does not replace that evaluation, and the importance of remaining in the ED until their evaluation is complete.     Al Decant, PA-C 05/05/23 1953

## 2023-05-05 NOTE — ED Triage Notes (Signed)
 The pt is c/o fever chills and a runny nose since yesterday her grandchild has been diagnosed with the flu and she just brought her mother in the ed for similar symptoms

## 2023-05-05 NOTE — ED Provider Notes (Signed)
 [place] EMERGENCY DEPARTMENT AT [facility] Provider Note   CSN: [id number] Arrival date & time: 05/05/23  1940     History  Chief Complaint  Patient presents with   Fever   Cough    Tonya Suarez is a 52 y.o. female.  53 year old female presents with 2 days of URI symptoms consisting of bodyaches chills fever, sore throat, short of breath.  Notes positive sick exposures.  No vomiting or diarrhea.  Has been using over-the-counter medications without relief       Home Medications Prior to Admission medications   Medication Sig Start Date End Date Taking? Authorizing Provider  amLODipine (NORVASC) 2.5 MG tablet TAKE 1 TABLET BY MOUTH EVERY DAY 02/27/23   Shelva Majestic, MD  aspirin EC 325 MG tablet Take 1 tablet (325 mg total) by mouth daily. 03/22/23   Huel Cote, MD  azelastine (ASTELIN) 0.1 % nasal spray Place 2 sprays into both nostrils 2 (two) times daily. 05/11/21   Shelva Majestic, MD  clotrimazole-betamethasone (LOTRISONE) cream APPLY TO AFFECTED AREA TWICE A DAY 12/27/22   Felecia Shelling, DPM  Continuous Blood Gluc Sensor (FREESTYLE LIBRE 3 SENSOR) MISC Quantity: 2 sensors/month NDC# [id number] Sensor Refills: PRN or 12 refills annually 09/02/21   Shelva Majestic, MD  diclofenac Sodium (VOLTAREN) 1 % GEL Apply 4 g topically 4 (four) times daily. To affected joint. 11/28/19   Rodolph Bong, MD  fluticasone [facility]) 50 MCG/ACT nasal spray SPRAY 2 SPRAYS INTO EACH NOSTRIL EVERY DAY 05/11/21   Shelva Majestic, MD  gabapentin (NEURONTIN) 300 MG capsule TAKE 2 CAPSULES BY MOUTH AT BEDTIME 03/22/23   Rodolph Bong, MD  hydrochlorothiazide (HYDRODIURIL) 25 MG tablet TAKE 1 TABLET (25 MG TOTAL) BY MOUTH DAILY. 02/27/23   Shelva Majestic, MD  ipratropium (ATROVENT) 0.03 % nasal spray PLACE 2 SPRAYS INTO BOTH NOSTRILS EVERY 12 (TWELVE) HOURS. 03/21/23   Shelva Majestic, MD  meloxicam (MOBIC) 7.5 MG tablet TAKE 1 TABLET (7.5 MG TOTAL) BY MOUTH DAILY AS  NEEDED. FOR PAIN 01/09/23   Shelva Majestic, MD  oxyCODONE (ROXICODONE) 5 MG immediate release tablet Take 1 tablet (5 mg total) by mouth every 4 (four) hours as needed for severe pain (pain score 7-10) or breakthrough pain. 03/22/23   Huel Cote, MD  pantoprazole (PROTONIX) 40 MG tablet TAKE 1 TABLET BY MOUTH EVERY DAY 03/10/23   Shelva Majestic, MD  predniSONE (DELTASONE) 50 MG tablet Take 1 tablet (50 mg total) by mouth daily. 03/21/23   Rodolph Bong, MD  promethazine-dextromethorphan (PROMETHAZINE-DM) 6.25-15 MG/5ML syrup Take 5 mLs by mouth 4 (four) times daily as needed. 12/02/22   Ardith Dark, MD  rosuvastatin (CRESTOR) 10 MG tablet TAKE 1 TABLET (10 MG TOTAL) BY MOUTH ONCE A WEEK. 11/25/22   Shelva Majestic, MD  Semaglutide,0.25 or 0.5MG /DOS, (OZEMPIC, 0.25 OR 0.5 MG/DOSE,) 2 MG/3ML SOPN INJECT 0.25 MG AS DIRECTED ONCE A WEEK FOR 28 DAYS, THEN 0.5 MG ONCE A WEEK FOR 28 DAYS 11/07/22   Shelva Majestic, MD  tiZANidine (ZANAFLEX) 4 MG tablet Take 0.5-1 tablets (2-4 mg total) by mouth every 8 (eight) hours as needed for muscle spasms. 06/16/22   Rodolph Bong, MD      Allergies    Amoxicillin    Review of Systems   Review of Systems  All other systems reviewed and are negative.   Physical Exam Updated Vital Signs BP 105/66 (BP Location: Right  Arm)   Pulse 99   Temp 98.4 F (36.9 C) (Oral)   Resp 20   Ht 1.778 m (5\' 10" )   Wt 104.3 kg   LMP 06/05/2018 Comment: BTL  SpO2 97%   BMI 32.99 kg/m  Physical Exam Vitals and nursing note reviewed.  Constitutional:      General: She is not in acute distress.    Appearance: Normal appearance. She is well-developed. She is not toxic-appearing.  HENT:     Head: Normocephalic and atraumatic.     Mouth/Throat:     Comments: Oropharynx clear Eyes:     General: Lids are normal.     Conjunctiva/sclera: Conjunctivae normal.     Pupils: Pupils are equal, round, and reactive to light.  Neck:     Thyroid: No thyroid mass.      Trachea: No tracheal deviation.  Cardiovascular:     Rate and Rhythm: Normal rate and regular rhythm.     Heart sounds: Normal heart sounds. No murmur heard.    No gallop.  Pulmonary:     Effort: Pulmonary effort is normal. No respiratory distress.     Breath sounds: Normal breath sounds. No stridor. No decreased breath sounds, wheezing, rhonchi or rales.  Abdominal:     General: There is no distension.     Palpations: Abdomen is soft.     Tenderness: There is no abdominal tenderness. There is no rebound.  Musculoskeletal:        General: No tenderness. Normal range of motion.     Cervical back: Normal range of motion and neck supple.  Skin:    General: Skin is warm and dry.     Findings: No abrasion or rash.  Neurological:     Mental Status: She is alert and oriented to person, place, and time. Mental status is at baseline.     GCS: GCS eye subscore is 4. GCS verbal subscore is 5. GCS motor subscore is 6.     Cranial Nerves: No cranial nerve deficit.     Sensory: No sensory deficit.     Motor: Motor function is intact.  Psychiatric:        Attention and Perception: Attention normal.        Speech: Speech normal.        Behavior: Behavior normal.     ED Results / Procedures / Treatments   Labs (all labs ordered are listed, but only abnormal results are displayed) Labs Reviewed  CBC - Abnormal; Notable for the following components:      Result Value   Hemoglobin 15.4 (*)    MCHC 36.4 (*)    All other components within normal limits  BASIC METABOLIC PANEL - Abnormal; Notable for the following components:   Potassium 3.4 (*)    Glucose, Bld 103 (*)    All other components within normal limits  RESP PANEL BY RT-PCR (RSV, FLU A&B, COVID)  [id number]    EKG None  Radiology DG Chest Portable 1 View Result Date: 05/05/2023 CLINICAL DATA:  Sepsis fever EXAM: PORTABLE CHEST 1 VIEW COMPARISON:  04/04/2013 FINDINGS: The heart size and mediastinal contours are within normal limits.  Both lungs are clear. The visualized skeletal structures are unremarkable. IMPRESSION: No active disease. Electronically Signed   By: Jasmine Pang M.D.   On: 05/05/2023 21:30    Procedures Procedures    Medications Ordered in ED Medications  acetaminophen (TYLENOL) tablet 1,000 mg (1,000 mg Oral Given 05/05/23 2011)    ED  Course/ Medical Decision Making/ A&P                                 Medical Decision Making  Patient's chest x-ray shows no active disease.  COVID and flu test negative here.  Labs are reassuring.  Patient given antipyretics and has defervesced.  Suspect viral illness and will discharge        Final Clinical Impression(s) / ED Diagnoses Final diagnoses:  None    Rx / DC Orders ED Discharge Orders     None         Lorre Nick, MD 05/05/23 2231

## 2023-05-15 NOTE — Progress Notes (Signed)
 Pt diagnosed with URI on 05/05/23. Per anesthesia guidelines patient must be delayed 4 weeks from diagnosis and must be 2 weeks symptom free by DOS. Earliest patient can be scheduled would be 06/02/23. LVM with April at Dr. Serena Croissant office.

## 2023-05-18 ENCOUNTER — Ambulatory Visit (INDEPENDENT_AMBULATORY_CARE_PROVIDER_SITE_OTHER): Payer: No Typology Code available for payment source | Admitting: Family Medicine

## 2023-05-18 ENCOUNTER — Encounter: Payer: Self-pay | Admitting: Family Medicine

## 2023-05-18 VITALS — BP 112/78 | HR 76 | Temp 97.2°F | Ht 70.0 in | Wt 223.0 lb

## 2023-05-18 DIAGNOSIS — E1169 Type 2 diabetes mellitus with other specified complication: Secondary | ICD-10-CM

## 2023-05-18 DIAGNOSIS — I10 Essential (primary) hypertension: Secondary | ICD-10-CM | POA: Diagnosis not present

## 2023-05-18 DIAGNOSIS — R600 Localized edema: Secondary | ICD-10-CM | POA: Diagnosis not present

## 2023-05-18 DIAGNOSIS — E785 Hyperlipidemia, unspecified: Secondary | ICD-10-CM | POA: Diagnosis not present

## 2023-05-18 DIAGNOSIS — E119 Type 2 diabetes mellitus without complications: Secondary | ICD-10-CM

## 2023-05-18 DIAGNOSIS — Z7985 Long-term (current) use of injectable non-insulin antidiabetic drugs: Secondary | ICD-10-CM

## 2023-05-18 NOTE — Progress Notes (Signed)
 Phone [phone] In person visit   Subjective:   Tonya Suarez is a 53 y.o. year old very pleasant female patient who presents for/with See problem oriented charting Chief Complaint  Patient presents with   Medical Management of Chronic Issues   Diabetes   Hypertension   Hip Pain    Pt c/o left hip pain    Past Medical History-  Patient Active Problem List   Diagnosis Date Noted   Controlled diabetes mellitus (HCC) 05/13/2021    Priority: High   Former smoker 07/19/2018    Priority: High   Hyperlipidemia associated with type 2 diabetes mellitus (HCC) 05/25/2021    Priority: Medium    Constipation 07/19/2018    Priority: Medium    History of adenomatous polyp of colon 09/23/2016    Priority: Medium    Migraines 05/06/2016    Priority: Medium    HTN (hypertension) 11/21/2011    Priority: Medium    Depression 09/06/2006    Priority: Medium    Restless legs 07/19/2018    Priority: Low   Ingrown toenail 12/20/2016    Priority: Low   Family history of cancer of GI tract 12/06/2012    Priority: Low   MENORRHAGIA 09/23/2009    Priority: Low   PLANTAR FASCIITIS 09/20/2007    Priority: Low   Osteoarthritis 09/19/2006    Priority: Low   Allergic rhinitis 09/06/2006    Priority: Low   GERD 09/06/2006    Priority: Low   Recurrent major depressive disorder, in full remission (HCC) 05/11/2021   Bilateral sciatica 12/08/2020   Mass of thigh, left 08/26/2020   Pain in right hand 09/27/2018   B12 deficiency 07/19/2018    Medications- reviewed and updated Current Outpatient Medications  Medication Sig Dispense Refill   amLODipine (NORVASC) 2.5 MG tablet TAKE 1 TABLET BY MOUTH EVERY DAY 90 tablet 3   aspirin EC 325 MG tablet Take 1 tablet (325 mg total) by mouth daily. 14 tablet 0   azelastine (ASTELIN) 0.1 % nasal spray Place 2 sprays into both nostrils 2 (two) times daily. 90 mL 3   clotrimazole-betamethasone (LOTRISONE) cream APPLY TO AFFECTED AREA TWICE A  DAY 45 g 1   Continuous Blood Gluc Sensor (FREESTYLE LIBRE 3 SENSOR) MISC Quantity: 2 sensors/month NDC# [phone] Sensor Refills: PRN or 12 refills annually 2 each 12   diclofenac Sodium (VOLTAREN) 1 % GEL Apply 4 g topically 4 (four) times daily. To affected joint. 100 g 11   fluticasone (FLONASE) 50 MCG/ACT nasal spray SPRAY 2 SPRAYS INTO EACH NOSTRIL EVERY DAY 48 mL 3   gabapentin (NEURONTIN) 300 MG capsule TAKE 2 CAPSULES BY MOUTH AT BEDTIME 180 capsule 1   hydrochlorothiazide (HYDRODIURIL) 25 MG tablet TAKE 1 TABLET (25 MG TOTAL) BY MOUTH DAILY. 90 tablet 3   ipratropium (ATROVENT) 0.03 % nasal spray PLACE 2 SPRAYS INTO BOTH NOSTRILS EVERY 12 (TWELVE) HOURS. 90 mL 2   meloxicam (MOBIC) 7.5 MG tablet TAKE 1 TABLET (7.5 MG TOTAL) BY MOUTH DAILY AS NEEDED. FOR PAIN 90 tablet 1   pantoprazole (PROTONIX) 40 MG tablet TAKE 1 TABLET BY MOUTH EVERY DAY 90 tablet 3   rosuvastatin (CRESTOR) 10 MG tablet TAKE 1 TABLET (10 MG TOTAL) BY MOUTH ONCE A WEEK. 13 tablet 3   Semaglutide,0.25 or 0.5MG /DOS, (OZEMPIC, 0.25 OR 0.5 MG/DOSE,) 2 MG/3ML SOPN INJECT 0.25 MG AS DIRECTED ONCE A WEEK FOR 28 DAYS, THEN 0.5 MG ONCE A WEEK FOR 28 DAYS 3 mL 5  oxyCODONE (ROXICODONE) 5 MG immediate release tablet Take 1 tablet (5 mg total) by mouth every 4 (four) hours as needed for severe pain (pain score 7-10) or breakthrough pain. (Patient not taking: Reported on 05/18/2023) 15 tablet 0   tiZANidine (ZANAFLEX) 4 MG tablet Take 0.5-1 tablets (2-4 mg total) by mouth every 8 (eight) hours as needed for muscle spasms. (Patient not taking: Reported on 05/18/2023) 30 tablet 2   No current facility-administered medications for this visit.     Objective:  BP 112/78   Pulse 76   Temp (!) 97.2 F (36.2 C)   Ht 5\' 10"  (1.778 m)   Wt 223 lb (101.2 kg)   LMP 06/05/2018 Comment: BTL  SpO2 97%   BMI 32.00 kg/m  Gen: NAD, resting comfortably CV: RRR no murmurs rubs or gallops Lungs: CTAB no crackles, wheeze, rhonchi Ext:  trace to 1+ edema on left with more prominent varicose veins. Minimal on right and less prominent varicose veins. No calf tenderness today on either Skin: warm, dry     Assessment and Plan   # Left hip pain S:hurts in left lateral hip and goes down into the leg. Not much back pain lately. Some weakness at times.  Can't even lay on it some nights- all lateral. Pain has been severe at times.  -Has seen Dr. Denyse Amass  in the past with history of bulging disc left worse than right. She is taking gabapentin and helps some. Heating peripheral arterial disease helps some. Has had trochanteric bursitis without help -Already planned for right shoulder surgery with Dr. Steward Drone in April  - physical therapy helps but is expensive. Has some old home exercises but inconsistent. Dry needling failed In past -gabapentin helps but makes too sleepy. Meloxicam helps some.  A/P: ongoing left hip pain with radiation into leg including below the knee. Certainly seems radicular- gabapentin helpful but basically taking max tolerated dose. Meloxicam helps some. Physical therapy helps some so is going to restart prior home exercises- we can do a formal physical therapy referral if needed or she could also ask Dr. Steward Drone his opinion or Dr. Denyse Amass again.     # Diabetes-new diagnosis with A1c of 9.2 on 05/12/2021 S: Medication:Ozempic 0.25 mg at last visit but up to 0.5 mg- doing ok with this -Trial Ozempic with low sugars- later restarted -Did not tolerate metformin due to GI side effects though A1c substantially improved Exercise and diet- weight down 7 pounds from last visit Lab Results  Component Value Date   HGBA1C 4.9 11/11/2022   HGBA1C 4.5 (L) 04/06/2022   HGBA1C 4.6 12/09/2021   A/P: suspect diabetes is well controlled - she will likely continue current medications with Ozempic 0.5 mg- nice gradual weight loss along with it  I discussed the microalbumin to creatinine lab error with patient that occurred with  [place] labs.  Essentially the ratio was off by a factor of 10.  In this patient's individual case  her value of 0.4 would convert to 4 which is still in normal range and 6 months ago this was confirmed with [facility] anyway at value of 4- we do not need to repeat that today   #Depression- no recent issues and off meds   #Hypertension S: Compliant with amlodipine 2.5 mg and hydrochlorothiazide 25 mg BP Readings from Last 3 Encounters:  05/18/23 112/78  05/05/23 105/66  03/21/23 112/74  A/P: blood pressure ok on repeat but I want her to keep eye on it at home  like she has been usually 120s/70s- if gets lower with weight loss could reduce blood pressure medications such as hydrochlorothiazide down to 12.5 mg daily    # Migraines with aura- may be worse with season change but ok lately- [facility] migraine tablets.   #left leg edema- also more prominent varicose veins on that side- we opted to rule out deep vein thrombosis with D dimer- if positive we will need to get ultrasound. Reports family history of deep vein thrombosis- dad and 2 paternal aunts -worsening pain or swelling or shortness of breath please seek care immediately- instructed patient  Family History  Problem Relation Age of Onset   Colon polyps Mother    Diabetes Mother    Hypertension Mother    Hyperlipidemia Mother    Kidney disease Mother        has fistula- could need dialysis at any time   Colon polyps Father    Diabetes Father        mother   Hyperlipidemia Father        mother   Hypertension Father        mother   Colon cancer Father        75s, and grandmother   Lung cancer Father        in 52s   Deep vein thrombosis Father        not during dtime of cancer. also paternal aunts reported   Cancer - Other Sister        duodenal   Stomach cancer Sister    Colon cancer Paternal Grandmother    Colon cancer Maternal Aunt    Esophageal cancer Neg Hx    Liver cancer Neg Hx    Pancreatic cancer Neg Hx     Rectal cancer Neg Hx      Recommended follow up: Return in about 6 months (around 11/18/2023) for physical or sooner if needed.Schedule b4 you leave. Future Appointments  Date Time Provider Department Center  05/30/2023  2:45 PM Susy Manor, PT [facility]  06/07/2023  3:30 PM Susy Manor, PT [facility]  06/14/2023  3:30 PM Susy Manor, PT [facility]  06/21/2023  3:30 PM Susy Manor, PT [facility]  06/26/2023  9:15 AM Huel Cote, MD [facility]    Lab/Order associations:   ICD-10-CM   1. Controlled type 2 diabetes mellitus without complication, without long-term current use of insulin (HCC)  E11.9 Hemoglobin A1c    Comprehensive metabolic panel with GFR    CBC with Differential/Platelet    CANCELED: Urine Microalbumin w/creat. ratio    2. Hypertension, unspecified type  I10 Comprehensive metabolic panel with GFR    CBC with Differential/Platelet    3. Hyperlipidemia associated with type 2 diabetes mellitus (HCC)  E11.69 Comprehensive metabolic panel with GFR   E78.5 CBC with Differential/Platelet    4. Leg edema, left  R60.0 D-Dimer, Quantitative      No orders of the defined types were placed in this encounter.   Return precautions advised.  Tana Conch, MD

## 2023-05-18 NOTE — Patient Instructions (Addendum)
 Please stop by lab before you go If you have mychart- we will send your results within 3 business days of [place] receiving them.  If you do not have mychart- we will call you about results within 5 business days of [place] receiving them.  *please also note that you will see labs on mychart as soon as they post. I will later go in and write notes on them- will say "notes from Dr. Durene Cal"   Recommended follow up: Return in about 6 months (around 11/18/2023) for physical or sooner if needed.Schedule b4 you leave.

## 2023-05-19 ENCOUNTER — Encounter: Payer: Self-pay | Admitting: Family Medicine

## 2023-05-19 LAB — CBC WITH DIFFERENTIAL/PLATELET
Basophils Absolute: 0.1 10*3/uL (ref 0.0–0.1)
Basophils Relative: 0.7 % (ref 0.0–3.0)
Eosinophils Absolute: 0.3 10*3/uL (ref 0.0–0.7)
Eosinophils Relative: 2.6 % (ref 0.0–5.0)
HCT: 42.9 % (ref 36.0–46.0)
Hemoglobin: 15 g/dL (ref 12.0–15.0)
Lymphocytes Relative: 35.8 % (ref 12.0–46.0)
Lymphs Abs: 3.4 10*3/uL (ref 0.7–4.0)
MCHC: 34.9 g/dL (ref 30.0–36.0)
MCV: 92.3 fl (ref 78.0–100.0)
Monocytes Absolute: 0.4 10*3/uL (ref 0.1–1.0)
Monocytes Relative: 3.8 % (ref 3.0–12.0)
Neutro Abs: 5.5 10*3/uL (ref 1.4–7.7)
Neutrophils Relative %: 57.1 % (ref 43.0–77.0)
Platelets: 278 10*3/uL (ref 150.0–400.0)
RBC: 4.65 Mil/uL (ref 3.87–5.11)
RDW: 14.2 % (ref 11.5–15.5)
WBC: 9.6 10*3/uL (ref 4.0–10.5)

## 2023-05-19 LAB — COMPREHENSIVE METABOLIC PANEL WITH GFR
ALT: 17 U/L (ref 0–35)
AST: 21 U/L (ref 0–37)
Albumin: 4.2 g/dL (ref 3.5–5.2)
Alkaline Phosphatase: 59 U/L (ref 39–117)
BUN: 10 mg/dL (ref 6–23)
CO2: 30 meq/L (ref 19–32)
Calcium: 9.6 mg/dL (ref 8.4–10.5)
Chloride: 104 meq/L (ref 96–112)
Creatinine, Ser: 0.82 mg/dL (ref 0.40–1.20)
GFR: 80.92 mL/min (ref >60.00)
Glucose, Bld: 72 mg/dL (ref 70–99)
Potassium: 3.9 meq/L (ref 3.5–5.1)
Sodium: 141 meq/L (ref 135–145)
Total Bilirubin: 0.7 mg/dL (ref 0.2–1.2)
Total Protein: 6.6 g/dL (ref 6.0–8.3)

## 2023-05-19 LAB — D-DIMER, QUANTITATIVE: D-Dimer, Quant: 0.33 ug{FEU}/mL (ref <0.50)

## 2023-05-20 LAB — HEMOGLOBIN A1C: Hgb A1c MFr Bld: 4.4 % — ABNORMAL LOW (ref 4.6–6.5)

## 2023-05-22 ENCOUNTER — Encounter: Payer: Self-pay | Admitting: Family Medicine

## 2023-05-30 ENCOUNTER — Ambulatory Visit (HOSPITAL_BASED_OUTPATIENT_CLINIC_OR_DEPARTMENT_OTHER): Payer: Self-pay | Admitting: Physical Therapy

## 2023-06-01 ENCOUNTER — Encounter (HOSPITAL_BASED_OUTPATIENT_CLINIC_OR_DEPARTMENT_OTHER): Payer: Self-pay | Admitting: Orthopaedic Surgery

## 2023-06-01 ENCOUNTER — Other Ambulatory Visit: Payer: Self-pay

## 2023-06-04 ENCOUNTER — Other Ambulatory Visit: Payer: Self-pay | Admitting: Family Medicine

## 2023-06-05 ENCOUNTER — Encounter (HOSPITAL_BASED_OUTPATIENT_CLINIC_OR_DEPARTMENT_OTHER): Payer: No Typology Code available for payment source | Admitting: Orthopaedic Surgery

## 2023-06-07 ENCOUNTER — Encounter (HOSPITAL_BASED_OUTPATIENT_CLINIC_OR_DEPARTMENT_OTHER): Payer: Self-pay | Admitting: Physical Therapy

## 2023-06-08 ENCOUNTER — Encounter (HOSPITAL_BASED_OUTPATIENT_CLINIC_OR_DEPARTMENT_OTHER)
Admission: RE | Admit: 2023-06-08 | Discharge: 2023-06-08 | Disposition: A | Discharge status: Home / Self Care | Source: Ambulatory Visit | Attending: Orthopaedic Surgery | Admitting: Orthopaedic Surgery

## 2023-06-08 DIAGNOSIS — R9431 Abnormal electrocardiogram [ECG] [EKG]: Secondary | ICD-10-CM | POA: Diagnosis not present

## 2023-06-08 DIAGNOSIS — Z01810 Encounter for preprocedural cardiovascular examination: Secondary | ICD-10-CM | POA: Diagnosis present

## 2023-06-08 NOTE — Progress Notes (Signed)

## 2023-06-12 ENCOUNTER — Ambulatory Visit (HOSPITAL_BASED_OUTPATIENT_CLINIC_OR_DEPARTMENT_OTHER): Admitting: Anesthesiology

## 2023-06-12 ENCOUNTER — Encounter (HOSPITAL_BASED_OUTPATIENT_CLINIC_OR_DEPARTMENT_OTHER)
Admission: RE | Disposition: A | Discharge status: Home / Self Care | Payer: Self-pay | Source: Home / Self Care | Attending: Orthopaedic Surgery

## 2023-06-12 ENCOUNTER — Encounter (HOSPITAL_BASED_OUTPATIENT_CLINIC_OR_DEPARTMENT_OTHER): Payer: Self-pay | Admitting: Orthopaedic Surgery

## 2023-06-12 ENCOUNTER — Encounter (HOSPITAL_BASED_OUTPATIENT_CLINIC_OR_DEPARTMENT_OTHER): Payer: No Typology Code available for payment source | Admitting: Orthopaedic Surgery

## 2023-06-12 ENCOUNTER — Ambulatory Visit (HOSPITAL_BASED_OUTPATIENT_CLINIC_OR_DEPARTMENT_OTHER)
Admission: RE | Admit: 2023-06-12 | Discharge: 2023-06-12 | Disposition: A | Discharge status: Home / Self Care | Payer: No Typology Code available for payment source | Attending: Orthopaedic Surgery | Admitting: Orthopaedic Surgery

## 2023-06-12 ENCOUNTER — Other Ambulatory Visit: Payer: Self-pay

## 2023-06-12 DIAGNOSIS — E119 Type 2 diabetes mellitus without complications: Secondary | ICD-10-CM | POA: Diagnosis not present

## 2023-06-12 DIAGNOSIS — R519 Headache, unspecified: Secondary | ICD-10-CM | POA: Insufficient documentation

## 2023-06-12 DIAGNOSIS — M7541 Impingement syndrome of right shoulder: Secondary | ICD-10-CM

## 2023-06-12 DIAGNOSIS — K219 Gastro-esophageal reflux disease without esophagitis: Secondary | ICD-10-CM | POA: Diagnosis not present

## 2023-06-12 DIAGNOSIS — M7521 Bicipital tendinitis, right shoulder: Secondary | ICD-10-CM | POA: Diagnosis not present

## 2023-06-12 DIAGNOSIS — M19011 Primary osteoarthritis, right shoulder: Secondary | ICD-10-CM | POA: Diagnosis not present

## 2023-06-12 DIAGNOSIS — I10 Essential (primary) hypertension: Secondary | ICD-10-CM | POA: Insufficient documentation

## 2023-06-12 DIAGNOSIS — Z87891 Personal history of nicotine dependence: Secondary | ICD-10-CM | POA: Diagnosis not present

## 2023-06-12 DIAGNOSIS — M1990 Unspecified osteoarthritis, unspecified site: Secondary | ICD-10-CM | POA: Insufficient documentation

## 2023-06-12 DIAGNOSIS — Z01818 Encounter for other preprocedural examination: Secondary | ICD-10-CM

## 2023-06-12 DIAGNOSIS — F32A Depression, unspecified: Secondary | ICD-10-CM | POA: Insufficient documentation

## 2023-06-12 HISTORY — PX: SHOULDER ARTHROSCOPY WITH DISTAL CLAVICLE RESECTION: SHX5675

## 2023-06-12 HISTORY — PX: SUBACROMIAL DECOMPRESSION: SHX5174

## 2023-06-12 SURGERY — SHOULDER ARTHROSCOPY WITH DISTAL CLAVICLE RESECTION
Anesthesia: Regional | Site: Shoulder | Laterality: Right

## 2023-06-12 MED ORDER — PROPOFOL 10 MG/ML IV BOLUS
INTRAVENOUS | Status: AC
  Filled 2023-06-12: qty 20

## 2023-06-12 MED ORDER — GABAPENTIN 300 MG PO CAPS
ORAL_CAPSULE | ORAL | Status: AC
  Filled 2023-06-12: qty 1

## 2023-06-12 MED ORDER — ONDANSETRON HCL 4 MG/2ML IJ SOLN: 4.0000 mg | Freq: Once | INTRAMUSCULAR | Status: DC | PRN

## 2023-06-12 MED ORDER — TRANEXAMIC ACID-NACL 1000-0.7 MG/100ML-% IV SOLN
INTRAVENOUS | Status: AC
  Filled 2023-06-12: qty 100

## 2023-06-12 MED ORDER — OXYCODONE HCL 5 MG PO TABS: 5.0000 mg | ORAL_TABLET | Freq: Once | ORAL | Status: DC | PRN

## 2023-06-12 MED ORDER — AMISULPRIDE (ANTIEMETIC) 5 MG/2ML IV SOLN: 10.0000 mg | Freq: Once | INTRAVENOUS | Status: DC | PRN

## 2023-06-12 MED ORDER — PROPOFOL 10 MG/ML IV BOLUS
INTRAVENOUS | Status: DC | PRN
Start: 2023-06-12 — End: 2023-06-12
  Administered 2023-06-12: 200 mg via INTRAVENOUS

## 2023-06-12 MED ORDER — OXYCODONE HCL 5 MG/5ML PO SOLN: 5.0000 mg | Freq: Once | ORAL | Status: DC | PRN

## 2023-06-12 MED ORDER — ONDANSETRON HCL 4 MG/2ML IJ SOLN
INTRAMUSCULAR | Status: DC | PRN
  Administered 2023-06-12: 4 mg via INTRAVENOUS

## 2023-06-12 MED ORDER — MIDAZOLAM HCL 2 MG/2ML IJ SOLN
2.0000 mg | Freq: Once | INTRAMUSCULAR | Status: AC
  Administered 2023-06-12: 2 mg via INTRAVENOUS

## 2023-06-12 MED ORDER — MIDAZOLAM HCL 2 MG/2ML IJ SOLN
INTRAMUSCULAR | Status: AC
  Filled 2023-06-12: qty 2

## 2023-06-12 MED ORDER — ROCURONIUM BROMIDE 10 MG/ML (PF) SYRINGE
PREFILLED_SYRINGE | INTRAVENOUS | Status: AC
  Filled 2023-06-12: qty 10

## 2023-06-12 MED ORDER — TRANEXAMIC ACID-NACL 1000-0.7 MG/100ML-% IV SOLN
1000.0000 mg | INTRAVENOUS | Status: AC
  Administered 2023-06-12: 1000 mg via INTRAVENOUS

## 2023-06-12 MED ORDER — FENTANYL CITRATE (PF) 100 MCG/2ML IJ SOLN
INTRAMUSCULAR | Status: AC
  Filled 2023-06-12: qty 2

## 2023-06-12 MED ORDER — ONDANSETRON HCL 4 MG/2ML IJ SOLN
INTRAMUSCULAR | Status: AC
  Filled 2023-06-12: qty 2

## 2023-06-12 MED ORDER — LACTATED RINGERS IV SOLN: INTRAVENOUS | Status: DC

## 2023-06-12 MED ORDER — SODIUM CHLORIDE 0.9 % IR SOLN
Status: DC | PRN
  Administered 2023-06-12: 6000 mL

## 2023-06-12 MED ORDER — CEFAZOLIN SODIUM-DEXTROSE 2-4 GM/100ML-% IV SOLN
INTRAVENOUS | Status: AC
  Filled 2023-06-12: qty 100

## 2023-06-12 MED ORDER — FENTANYL CITRATE (PF) 100 MCG/2ML IJ SOLN
100.0000 ug | Freq: Once | INTRAMUSCULAR | Status: AC
  Administered 2023-06-12: 100 ug via INTRAVENOUS

## 2023-06-12 MED ORDER — DEXAMETHASONE SODIUM PHOSPHATE 4 MG/ML IJ SOLN
INTRAMUSCULAR | Status: DC | PRN
  Administered 2023-06-12: 5 mg via INTRAVENOUS

## 2023-06-12 MED ORDER — ACETAMINOPHEN 500 MG PO TABS
ORAL_TABLET | ORAL | Status: AC
  Filled 2023-06-12: qty 2

## 2023-06-12 MED ORDER — LIDOCAINE 2% (20 MG/ML) 5 ML SYRINGE
INTRAMUSCULAR | Status: AC
  Filled 2023-06-12: qty 5

## 2023-06-12 MED ORDER — BUPIVACAINE LIPOSOME 1.3 % IJ SUSP
INTRAMUSCULAR | Status: DC | PRN
Start: 2023-06-12 — End: 2023-06-12
  Administered 2023-06-12: 10 mL via PERINEURAL

## 2023-06-12 MED ORDER — GABAPENTIN 300 MG PO CAPS
300.0000 mg | ORAL_CAPSULE | Freq: Once | ORAL | Status: AC
  Administered 2023-06-12: 300 mg via ORAL

## 2023-06-12 MED ORDER — HYDROMORPHONE HCL 1 MG/ML IJ SOLN: 0.2500 mg | INTRAMUSCULAR | Status: DC | PRN

## 2023-06-12 MED ORDER — EPHEDRINE 5 MG/ML INJ
INTRAVENOUS | Status: AC
  Filled 2023-06-12: qty 30

## 2023-06-12 MED ORDER — ROCURONIUM BROMIDE 100 MG/10ML IV SOLN
INTRAVENOUS | Status: DC | PRN
  Administered 2023-06-12: 60 mg via INTRAVENOUS

## 2023-06-12 MED ORDER — SUGAMMADEX SODIUM 200 MG/2ML IV SOLN
INTRAVENOUS | Status: DC | PRN
  Administered 2023-06-12: 300 mg via INTRAVENOUS

## 2023-06-12 MED ORDER — LIDOCAINE HCL (CARDIAC) PF 100 MG/5ML IV SOSY
PREFILLED_SYRINGE | INTRAVENOUS | Status: DC | PRN
  Administered 2023-06-12: 40 mg via INTRAVENOUS

## 2023-06-12 MED ORDER — CEFAZOLIN SODIUM-DEXTROSE 2-4 GM/100ML-% IV SOLN
2.0000 g | INTRAVENOUS | Status: AC
  Administered 2023-06-12: 2 g via INTRAVENOUS

## 2023-06-12 MED ORDER — ACETAMINOPHEN 500 MG PO TABS
1000.0000 mg | ORAL_TABLET | Freq: Once | ORAL | Status: AC
Start: 2023-06-12 — End: 2023-06-12
  Administered 2023-06-12: 1000 mg via ORAL

## 2023-06-12 MED ORDER — ROPIVACAINE HCL 5 MG/ML IJ SOLN
INTRAMUSCULAR | Status: DC | PRN
  Administered 2023-06-12: 15 mL via PERINEURAL

## 2023-06-12 MED ORDER — ACETAMINOPHEN 500 MG PO TABS: 1000.0000 mg | ORAL_TABLET | Freq: Once | ORAL | Status: DC

## 2023-06-12 SURGICAL SUPPLY — 51 items
ANCHOR QUATTRO LINK KNTLS 2.9 (Anchor) IMPLANT
BLADE EXCALIBUR 4.0X13 (MISCELLANEOUS) ×1 IMPLANT
BURR OVAL 8 FLU 4.0X13 (MISCELLANEOUS) ×1 IMPLANT
CANN ARTH HYDRODAM HIP 8.5X75 (CANNULA) IMPLANT
CANNULA 5.75X71 LONG (CANNULA) IMPLANT
CANNULA PASSPORT 5 (CANNULA) IMPLANT
CANNULA PASSPORT BUTTON 10-40 (CANNULA) IMPLANT
CANNULA TWIST IN 8.25X7CM (CANNULA) IMPLANT
CHLORAPREP W/TINT 26 (MISCELLANEOUS) ×2 IMPLANT
COOLER ICEMAN CLASSIC (MISCELLANEOUS) ×1 IMPLANT
DRAPE IMP U-DRAPE 54X76 (DRAPES) ×1 IMPLANT
DRAPE INCISE IOBAN 66X45 STRL (DRAPES) ×1 IMPLANT
DRAPE SHOULDER BEACH CHAIR (DRAPES) ×1 IMPLANT
DRAPE U-SHAPE 47X51 STRL (DRAPES) ×2 IMPLANT
DW OUTFLOW CASSETTE/TUBE SET (MISCELLANEOUS) ×1 IMPLANT
GAUZE PAD ABD 8X10 STRL (GAUZE/BANDAGES/DRESSINGS) ×1 IMPLANT
GAUZE SPONGE 4X4 12PLY STRL (GAUZE/BANDAGES/DRESSINGS) ×1 IMPLANT
GAUZE XEROFORM 1X8 LF (GAUZE/BANDAGES/DRESSINGS) ×1 IMPLANT
GLOVE BIO SURGEON STRL SZ 6 (GLOVE) ×1 IMPLANT
GLOVE BIO SURGEON STRL SZ7.5 (GLOVE) ×1 IMPLANT
GLOVE BIOGEL PI IND STRL 6.5 (GLOVE) ×1 IMPLANT
GLOVE BIOGEL PI IND STRL 8 (GLOVE) ×1 IMPLANT
GOWN STRL REUS W/ TWL LRG LVL3 (GOWN DISPOSABLE) ×2 IMPLANT
GOWN STRL REUS W/ TWL XL LVL3 (GOWN DISPOSABLE) ×1 IMPLANT
KIT STABILIZATION SHOULDER (MISCELLANEOUS) ×1 IMPLANT
KIT STR SPEAR 1.8 FBRTK DISP (KITS) IMPLANT
LASSO 90 CVE QUICKPAS (DISPOSABLE) IMPLANT
LASSO CRESCENT QUICKPASS (SUTURE) IMPLANT
MANIFOLD NEPTUNE II (INSTRUMENTS) ×1 IMPLANT
NDL HD SCORPION MEGA LOADER (NEEDLE) IMPLANT
NDL SUT PASSER RTC (NEEDLE) IMPLANT
NEEDLE SUT PASSER RTC (NEEDLE) ×1 IMPLANT
PACK ARTHROSCOPY DSU (CUSTOM PROCEDURE TRAY) ×1 IMPLANT
PACK BASIN DAY SURGERY FS (CUSTOM PROCEDURE TRAY) ×1 IMPLANT
PAD COLD SHLDR WRAP-ON (PAD) ×1 IMPLANT
RESTRAINT HEAD UNIVERSAL NS (MISCELLANEOUS) ×1 IMPLANT
SHEET MEDIUM DRAPE 40X70 STRL (DRAPES) ×1 IMPLANT
SLEEVE SCD COMPRESS KNEE MED (STOCKING) ×1 IMPLANT
SUT BROADBAND TAPE 2PK 1.5 (SUTURE) IMPLANT
SUT ETHILON 3 0 PS 1 (SUTURE) ×1 IMPLANT
SUT PDS AB 1 CT 36 (SUTURE) IMPLANT
SUT TIGER TAPE 7 IN WHITE (SUTURE) IMPLANT
SUT VIC AB 0 CT2 27 (SUTURE) IMPLANT
SUTURE FIBERWR #2 38 T-5 BLUE (SUTURE) IMPLANT
SUTURE TAPE 1.3 40 TPR END (SUTURE) IMPLANT
SUTURE TAPE TIGERLINK 1.3MM BL (SUTURE) IMPLANT
TAPE FIBER 2MM 7IN #2 BLUE (SUTURE) IMPLANT
TOWEL GREEN STERILE FF (TOWEL DISPOSABLE) ×2 IMPLANT
TUBE CONNECTING 20X1/4 (TUBING) ×1 IMPLANT
TUBING ARTHROSCOPY IRRIG 16FT (MISCELLANEOUS) ×1 IMPLANT
WAND ABLATOR APOLLO I90 (BUR) ×1 IMPLANT

## 2023-06-12 NOTE — Discharge Instructions (Addendum)
 Discharge Instructions    Attending Surgeon: Wilhelmenia Harada, MD Office Phone Number: [phone]   Diagnosis and Procedures:    Surgeries Performed: Right shoulder acromioplasty with biceps tenodesis  Discharge Plan:    Diet: Resume usual diet. Begin with light or bland foods.  Drink plenty of fluids.  Activity:  Keep sling and dressing in place until your follow up visit in Physical Therapy You are advised to go home directly from the hospital or surgical center. Restrict your activities.  GENERAL INSTRUCTIONS: 1.  Keep your surgical site elevated above your heart for at least 5-7 days or longer to prevent swelling. This will improve your comfort and your overall recovery following surgery.     2. Please call Dr. Verline Glow office at [phone] with questions Monday-Friday during business hours. If no one answers, please leave a message and someone should get back to the patient within 24 hours. For emergencies please call 911 or proceed to the emergency room.   3. Patient to notify surgical team if experiences any of the following: Bowel/Bladder dysfunction, uncontrolled pain, nerve/muscle weakness, incision with increased drainage or redness, nausea/vomiting and Fever greater than 101.0 F.  Be alert for signs of infection including redness, streaking, odor, fever or chills. Be alert for excessive pain or bleeding and notify your surgeon immediately.  WOUND INSTRUCTIONS:   Leave your dressing/cast/splint in place until your post operative visit.  Keep it clean and dry.  Always keep the incision clean and dry until the staples/sutures are removed. If there is no drainage from the incision you should keep it open to air. If there is drainage from the incision you must keep it covered at all times until the drainage stops  Do not soak in a bath tub, hot tub, pool, lake or other body of water until 21 days after your surgery and your incision is completely dry and healed.   If you have removable sutures (or staples) they must be removed 10-14 days (unless otherwise instructed) from the day of your surgery.     1)  Elevate the extremity as much as possible.  2)  Keep the dressing clean and dry.  3)  Please call us  if the dressing becomes wet or dirty.  4)  If you are experiencing worsening pain or worsening swelling, please call.     MEDICATIONS: Resume all previous home medications at the previous prescribed dose and frequency unless otherwise noted Start taking the  pain medications on an as-needed basis as prescribed  Please taper down pain medication over the next week following surgery.  Ideally you should not require a refill of any narcotic pain medication.  Take pain medication with food to minimize nausea. In addition to the prescribed pain medication, you may take over-the-counter pain relievers such as Tylenol .  Do NOT take additional tylenol  if your pain medication already has tylenol  in it.  Aspirin  325mg  daily per bottle instructions. Narcotic Policy: Per [facility] clinic policy, our goal is ensure optimal postoperative pain control with a multimodal pain management strategy. For all [facility] patients, our goal is to wean post-operative narcotic medications by 6 weeks post-operatively, and many times sooner. If this is not possible due to utilization of pain medication prior to surgery, your [facility] doctor will support your acute post-operative pain control for the first 6 weeks postoperatively, with a plan to transition you back to your primary pain team following that. Max Spain will work to ensure a Therapist, occupational.  FOLLOWUP INSTRUCTIONS: 1. Follow up at the Physical Therapy Clinic 3-4 days following surgery. This appointment should be scheduled unless other arrangements have been made.The Physical Therapy scheduling number is [phone] if an appointment has not already been arranged.  2. Contact Dr. Verline Glow office during office  hours at [phone] or the practice after hours line at [phone] for non-emergencies. For medical emergencies call 911.   Discharge Location: Home  No Tylenol  until after 7:00pm today, if needed.    Post Anesthesia Home Care Instructions  Activity: Get plenty of rest for the remainder of the day. A responsible individual must stay with you for 24 hours following the procedure.  For the next 24 hours, DO NOT: -Drive a car -Advertising copywriter -Drink alcoholic beverages -Take any medication unless instructed by your physician -Make any legal decisions or sign important papers.  Meals: Start with liquid foods such as gelatin or soup. Progress to regular foods as tolerated. Avoid greasy, spicy, heavy foods. If nausea and/or vomiting occur, drink only clear liquids until the nausea and/or vomiting subsides. Call your physician if vomiting continues.  Special Instructions/Symptoms: Your throat may feel dry or sore from the anesthesia or the breathing tube placed in your throat during surgery. If this causes discomfort, gargle with warm salt water. The discomfort should disappear within 24 hours.  If you had a scopolamine patch placed behind your ear for the management of post- operative nausea and/or vomiting:  1. The medication in the patch is effective for 72 hours, after which it should be removed.  Wrap patch in a tissue and discard in the trash. Wash hands thoroughly with soap and water. 2. You may remove the patch earlier than 72 hours if you experience unpleasant side effects which may include dry mouth, dizziness or visual disturbances. 3. Avoid touching the patch. Wash your hands with soap and water after contact with the patch.   Regional Anesthesia Blocks  1. You may not be able to move or feel the "blocked" extremity after a regional anesthetic block. This may last may last from 3-48 hours after placement, but it will go away. The length of time depends on the medication  injected and your individual response to the medication. As the nerves start to wake up, you may experience tingling as the movement and feeling returns to your extremity. If the numbness and inability to move your extremity has not gone away after 48 hours, please call your surgeon.   2. The extremity that is blocked will need to be protected until the numbness is gone and the strength has returned. Because you cannot feel it, you will need to take extra care to avoid injury. Because it may be weak, you may have difficulty moving it or using it. You may not know what position it is in without looking at it while the block is in effect.  3. For blocks in the legs and feet, returning to weight bearing and walking needs to be done carefully. You will need to wait until the numbness is entirely gone and the strength has returned. You should be able to move your leg and foot normally before you try and bear weight or walk. You will need someone to be with you when you first try to ensure you do not fall and possibly risk injury.  4. Bruising and tenderness at the needle site are common side effects and will resolve in a few days.  5. Persistent numbness or new problems with movement should  be communicated to the surgeon or the [facility] [phone] [facility] [phone]).Information for Discharge Teaching: EXPAREL  (bupivacaine  liposome injectable suspension)   Pain relief is important to your recovery. The goal is to control your pain so you can move easier and return to your normal activities as soon as possible after your procedure. Your physician may use several types of medicines to manage pain, swelling, and more.  Your surgeon or anesthesiologist gave you EXPAREL (bupivacaine ) to help control your pain after surgery.  EXPAREL  is a local anesthetic designed to release slowly over an extended period of time to provide pain relief by numbing the tissue around the  surgical site. EXPAREL  is designed to release pain medication over time and can control pain for up to 72 hours. Depending on how you respond to EXPAREL , you may require less pain medication during your recovery. EXPAREL  can help reduce or eliminate the need for opioids during the first few days after surgery when pain relief is needed the most. EXPAREL  is not an opioid and is not addictive. It does not cause sleepiness or sedation.   Important! A teal colored band has been placed on your arm with the date, time and amount of EXPAREL  you have received. Please leave this armband in place for the full 96 hours following administration, and then you may remove the band. If you return to the hospital for any reason within 96 hours following the administration of EXPAREL , the armband provides important information that your health care providers to know, and alerts them that you have received this anesthetic.    Possible side effects of EXPAREL : Temporary loss of sensation or ability to move in the area where medication was injected. Nausea, vomiting, constipation Rarely, numbness and tingling in your mouth or lips, lightheadedness, or anxiety may occur. Call your doctor right away if you think you may be experiencing any of these sensations, or if you have other questions regarding possible side effects.  Follow all other discharge instructions given to you by your surgeon or nurse. Eat a healthy diet and drink plenty of water or other fluids.

## 2023-06-12 NOTE — Anesthesia Procedure Notes (Signed)
 Procedure Name: Intubation Date/Time: 06/12/2023 2:56 PM  Performed by: Lonia Ro, CRNAPre-anesthesia Checklist: Patient identified, Emergency Drugs available, Suction available, Patient being monitored and Timeout performed Patient Re-evaluated:Patient Re-evaluated prior to induction Preoxygenation: Pre-oxygenation with 100% oxygen Induction Type: IV induction Ventilation: Mask ventilation without difficulty Laryngoscope Size: Mac and 4 Grade View: Grade I Tube type: Oral Tube size: 7.0 mm Number of attempts: 1 Airway Equipment and Method: Stylet Placement Confirmation: ETT inserted through vocal cords under direct vision, positive ETCO2 and breath sounds checked- equal and bilateral Secured at: 22 cm Tube secured with: Tape Dental Injury: Teeth and Oropharynx as per pre-operative assessment

## 2023-06-12 NOTE — Op Note (Addendum)
 Date of Surgery: 06/12/2023  INDICATIONS: [name] is a 53 y.o.-year-old female with right shoulder rotator cuff impingment.  The risk and benefits of the procedure were discussed in detail and documented in the pre-operative evaluation.   PREOPERATIVE DIAGNOSES: Right shoulder, biceps tendinitis and subacromial impingement.  POSTOPERATIVE DIAGNOSIS: Same.  PROCEDURE: Arthroscopic limited debridement - 96045 Arthroscopic distal clavicle excision - 40981 Arthroscopic subacromial decompression - 19147 Arthroscopic biceps tenodesis - 82956  SURGEON: Carmina Chris MD  ASSISTANT: Naida Austria, ATC  ANESTHESIA:  general plus interscalene nerve block  IV FLUIDS AND URINE: See anesthesia record.  ANTIBIOTICS: Ancef   ESTIMATED BLOOD LOSS: 5 mL.  IMPLANTS:  Implant Name Type Inv. Item Serial No. Manufacturer Lot No. LRB No. Used Action  ANCHOR QUATTRO LINK KNTLS 2.9 - [id number] Anchor ANCHOR QUATTRO LINK KNTLS 2.9  [employer](ORTH,TRAU,BIO,SG) [id number] Right 1 Implanted    DRAINS: None  CULTURES: None  COMPLICATIONS: none  PROCEDURE:    OPERATIVE FINDING: Exam under anesthesia:   Examination under anesthesia revealed forward elevation of 150 degrees.  With the arm at the side, there was 65 degrees of external rotation.  There is a 1+ anterior load shift and a 1+ posterior load shift.    Arthroscopic findings demonstrated: Articular space: Rotator interval redness Chondral surfaces: Normal Biceps: Tearing/redness, splitting Subscapularis: Intact Supraspinatus: Intact Infraspinatus: Intact    I identified the patient in the pre-operative holding area.  I marked the operative right shoulder with my initials. I reviewed the risks and benefits of the proposed surgical intervention and the patient wished to proceed.  Anesthesia was then performed with regional block.  The patient was transferred to the operative suite and placed in the beach chair position with all  bony prominences padded.     SCDs were placed on bilateral lower extremity. Appropriate antibiotics was administered within 1 hour before incision.  Anesthesia was induced.  The operative extremity was then prepped and draped in standard fashion. A time out was performed confirming the correct extremity, correct patient and correct procedure.   The arthroscope was introduced in the glenohumeral joint from a posterior portal.  An anterior portal was created.  The shoulder was examined and the above findings were noted.     With an arthroscopic shaver and a wand ablator, synovitis throughout the  shoulder was resected.  The arthroscopic shaver was used to excise torn portions of the labrum back to a stable margin. Specifically this was done for the anterior and superior labrum. The rotator interval was debrided using shaver and electrocautery.  At this time the biceps was tagged with a self passing device with a #2 nonabsorbable suture twice.  This was done through the anterior portal.  This was placed in a single 2.9 Quattro link anchor.     The rotator cuff was then approached through the subacromial space. Anterior, lateral, and posterior portals were used.  Bursectomy was performed with an arthroscopic shaver and ArthroCare wand.  The soft tissues and 1 mm of bone on the undersurface of the acromion and clavicle were resected with the arthroscopic shaver and wand. The distal aspect of the clavicle was resected with care not to destabilize the anteriorsuperior capsule    The shoulder was irrigated.  The arthroscopic instruments were removed.  Wounds were closed with 3-0 nylon sutures.  A sterile dressing was applied with xeroform, 4x8s, abdominal pad, and tape. An Irineo Manns was placed and the upper extremity was placed in a shoulder immobilizer.  The  patient tolerated the procedure well and was taken to the recovery room in stable condition.  All counts were correct in the case. The patient tolerated the  procedure well and was taken to the recovery room in stable condition.    POSTOPERATIVE PLAN: She will follow the biceps tenodesis protocol. She will be allowed overhead motion as tolerated once her block wears off. She will be placed on aspirin  for blood clot prevention.  Carmina Chris, MD 3:46 PM

## 2023-06-12 NOTE — Anesthesia Postprocedure Evaluation (Signed)
 Anesthesia Post Note  Patient: Tonya Suarez  Procedure(s) Performed: RIGHT SHOULDER ARTHROSCOPY WITH DISTAL CLAVICLE RESECTION, SUBPECTORIAL TENODESIS (Right: Shoulder) RIGHT SUBACROMIAL DECOMPRESSION (Right: Shoulder)     Patient location during evaluation: PACU Anesthesia Type: Regional and General Level of consciousness: awake and alert, oriented and patient cooperative Pain management: pain level controlled Vital Signs Assessment: post-procedure vital signs reviewed and stable Respiratory status: spontaneous breathing, nonlabored ventilation and respiratory function stable Cardiovascular status: blood pressure returned to baseline and stable Postop Assessment: no apparent nausea or vomiting Anesthetic complications: no   No notable events documented.  Last Vitals:  Vitals:   06/12/23 1602 06/12/23 1615  BP: (!) 116/95 95/61  Pulse: 84 87  Resp: (!) 24 (!) 21  Temp: (!) 36.2 C   SpO2: 97% 93%    Last Pain:  Vitals:   06/12/23 1615  TempSrc:   PainSc: 0-No pain                 Jacquelyne Matte

## 2023-06-12 NOTE — Transfer of Care (Signed)
 Immediate Anesthesia Transfer of Care Note  Patient: Tonya Suarez  Procedure(s) Performed: RIGHT SHOULDER ARTHROSCOPY WITH DISTAL CLAVICLE RESECTION, SUBPECTORIAL TENODESIS (Right: Shoulder) RIGHT SUBACROMIAL DECOMPRESSION (Right: Shoulder)  Patient Location: PACU  Anesthesia Type:General  Level of Consciousness: awake and patient cooperative  Airway & Oxygen Therapy: Patient Spontanous Breathing and Patient connected to nasal cannula oxygen  Post-op Assessment: Report given to RN and Post -op Vital signs reviewed and stable  Post vital signs: Reviewed and stable  Last Vitals:  Vitals Value Taken Time  BP 116/95 06/12/23 1602  Temp 36.2 C 06/12/23 1602  Pulse 85 06/12/23 1606  Resp 27 06/12/23 1606  SpO2 94 % 06/12/23 1606  Vitals shown include unfiled device data.  Last Pain:  Vitals:   06/12/23 1253  TempSrc: Tympanic  PainSc: 8       Patients Stated Pain Goal: 8 (06/12/23 1253)  Complications: No notable events documented.

## 2023-06-12 NOTE — Interval H&P Note (Signed)
 History and Physical Interval Note:  06/12/2023 12:28 PM  Tonya Suarez Tonya Suarez  has presented today for surgery, with the diagnosis of RIGHT SHOULDER ROTATOR CUFF IMPINGEMENT.  The various methods of treatment have been discussed with the patient and family. After consideration of risks, benefits and other options for treatment, the patient has consented to  Procedure(s): RIGHT SHOULDER ARTHROSCOPY WITH DISTAL CLAVICLE RESECTION POSSIBLE SUBPECTORIAL TENODESIS (Right) RIGHT SUBACROMIAL DECOMPRESSION (Right) as a surgical intervention.  The patient's history has been reviewed, patient examined, no change in status, stable for surgery.  I have reviewed the patient's chart and labs.  Questions were answered to the patient's satisfaction.     [name]

## 2023-06-12 NOTE — Brief Op Note (Signed)
   Brief Op Note  Date of Surgery: 06/12/2023  Preoperative Diagnosis: RIGHT SHOULDER ROTATOR CUFF IMPINGEMENT  Postoperative Diagnosis: same  Procedure: Procedure(s): RIGHT SHOULDER ARTHROSCOPY WITH DISTAL CLAVICLE RESECTION POSSIBLE SUBPECTORIAL TENODESIS RIGHT SUBACROMIAL DECOMPRESSION  Implants: Implant Name Type Inv. Item Serial No. Manufacturer Lot No. LRB No. Used Action  ANCHOR QUATTRO LINK KNTLS 2.9 - [id number] Anchor ANCHOR Dani Dupont KNTLS 2.9  [employer](ORTH,TRAU,BIO,SG) [id number] Right 1 Implanted    Surgeons: Surgeon(s): Wilhelmenia Harada, MD  Anesthesia: General    Estimated Blood Loss: See anesthesia record  Complications: None  Condition to PACU: Stable  Carmina Chris, MD 06/12/2023 3:46 PM

## 2023-06-12 NOTE — Anesthesia Preprocedure Evaluation (Addendum)
 Anesthesia Evaluation  Patient identified by MRN, date of birth, ID band Patient awake    Reviewed: Allergy & Precautions, NPO status , Patient's Chart, lab work & pertinent test results  Airway Mallampati: III  TM Distance: >3 FB Neck ROM: Full    Dental  (+) Teeth Intact, Dental Advisory Given   Pulmonary former smoker   Pulmonary exam normal breath sounds clear to auscultation       Cardiovascular hypertension (124/79 preop), Pt. on medications Normal cardiovascular exam Rhythm:Regular Rate:Normal     Neuro/Psych  Headaches PSYCHIATRIC DISORDERS  Depression       GI/Hepatic Neg liver ROS,GERD  Controlled,,  Endo/Other  diabetes, Well Controlled, Type 2  BMI 33  Renal/GU negative Renal ROS  negative genitourinary   Musculoskeletal  (+) Arthritis , Osteoarthritis,    Abdominal   Peds  Hematology negative hematology ROS (+)   Anesthesia Other Findings Ozempic  LD: 8d  Reproductive/Obstetrics negative OB ROS                             Anesthesia Physical Anesthesia Plan  ASA: 2  Anesthesia Plan: General and Regional   Post-op Pain Management: Tylenol  PO (pre-op)* and Regional block*   Induction: Intravenous  PONV Risk Score and Plan: 3 and Ondansetron , Dexamethasone , Midazolam  and Treatment may vary due to age or medical condition  Airway Management Planned: Oral ETT  Additional Equipment: None  Intra-op Plan:   Post-operative Plan: Extubation in OR  Informed Consent: I have reviewed the patients History and Physical, chart, labs and discussed the procedure including the risks, benefits and alternatives for the proposed anesthesia with the patient or authorized representative who has indicated his/her understanding and acceptance.     Dental advisory given  Plan Discussed with: CRNA  Anesthesia Plan Comments:        Anesthesia Quick Evaluation

## 2023-06-12 NOTE — Anesthesia Procedure Notes (Signed)
 Anesthesia Regional Block: Interscalene brachial plexus block   Pre-Anesthetic Checklist: , timeout performed,  Correct Patient, Correct Site, Correct Laterality,  Correct Procedure, Correct Position, site marked,  Risks and benefits discussed,  Surgical consent,  Pre-op evaluation,  At surgeon's request and post-op pain management  Laterality: Right  Prep: Maximum Sterile Barrier Precautions used, chloraprep       Needles:  Injection technique: Single-shot  Needle Type: Echogenic Stimulator Needle     Needle Length: 9cm  Needle Gauge: 22     Additional Needles:   Procedures:,,,, ultrasound used (permanent image in chart),,    Narrative:  Start time: 06/12/2023 1:20 PM End time: 06/12/2023 1:25 PM Injection made incrementally with aspirations every 5 mL.  Performed by: Personally  Anesthesiologist: Jacquelyne Matte, DO  Additional Notes: Monitors applied. No increased pain on injection. No increased resistance to injection. Injection made in 5cc increments. Good needle visualization. Patient tolerated procedure well.

## 2023-06-12 NOTE — H&P (Signed)
 Expand All Collapse All       Chief Complaint: Right shoulder pain        History of Present Illness:      Tonya Suarez is a 53 y.o. female right-hand-dominant female presents with ongoing right shoulder pain for the last 2 years.  She has been participating in physical therapy per recommendation of Dr. Alease Hunter.  She has also been having injections approximately 2/year which has given her temporary relief.  This time she is still having persistent pain with overhead activity and range of motion.  She has having a hard time laying directly on the side.  She has been on long-term Mobic  as result of this.  She is taking muscle relaxer as needed which is somewhat helpful.  At this point she remains quite limited.       PMH/PSH/Family History/Social History/Meds/Allergies:         Past Medical History:  Diagnosis Date   Abnormal Pap smear of cervix      ascus hpv hr neg   Allergy     Arthritis     Chronic headaches     Depression     Diabetes mellitus without complication (HCC)     GERD (gastroesophageal reflux disease)     Hypertension     Low back pain     UTI (urinary tract infection)               Past Surgical History:  Procedure Laterality Date   CERVICAL BIOPSY  W/ LOOP ELECTRODE EXCISION       COLPOSCOPY        CIN2/3   TUBAL LIGATION            Social History         Socioeconomic History   Marital status: Married      Spouse name: Not on file   Number of children: Not on file   Years of education: Not on file   Highest education level: Not on file  Occupational History   Not on file  Tobacco Use   Smoking status: Former      Types: Cigars   Smokeless tobacco: Never   Tobacco comments:      black and mild one per day  Vaping Use   Vaping status: Never Used  Substance and Sexual Activity   Alcohol use: Yes      Alcohol/week: 0.0 - 2.0 standard drinks of alcohol      Comment: occ   Drug use: No   Sexual activity: Yes      Partners: Male       Birth control/protection: Surgical, Post-menopausal      Comment: tubal ligation  Other Topics Concern   Not on file  Social History Narrative    Married May 13th 2017. 2 children- son 12 (married) and daughter 17. Granddaughter august 2017.          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         Hobbies: time with family    Social Drivers of Acupuncturist Strain: Not on file  Food Insecurity: Not on file  Transportation Needs: Not on file  Physical Activity: Not on file  Stress: Not on file  Social Connections: Unknown (08/03/2021)    Received from [facility], [facility]    Social Network     Social Network: Not on file  Family History  Problem Relation Age of Onset   Colon polyps Mother     Diabetes Mother     Hypertension Mother     Hyperlipidemia Mother     Kidney disease Mother          has fistula- could need dialysis at any time   Colon polyps Father     Diabetes Father          mother   Hyperlipidemia Father          mother   Hypertension Father          mother   Colon cancer Father          21s, and grandmother   Lung cancer Father          in 40s   Cancer - Other Sister          duodenal   Stomach cancer Sister     Colon cancer Maternal Aunt     Colon cancer Paternal Grandmother     Esophageal cancer Neg Hx     Liver cancer Neg Hx     Pancreatic cancer Neg Hx     Rectal cancer Neg Hx          Allergies       Allergies  Allergen Reactions   Amoxicillin Swelling      Lip swelling            Current Outpatient Medications  Medication Sig Dispense Refill   acetaminophen  (TYLENOL ) 500 MG tablet Take 1 tablet (500 mg total) by mouth every 8 (eight) hours for 10 days. 30 tablet 0   aspirin  EC 325 MG tablet Take 1 tablet (325 mg total) by mouth daily. 14 tablet 0   ibuprofen  (ADVIL ) 800 MG tablet Take 1 tablet (800 mg total) by mouth every 8 (eight) hours for 10 days. Please  take with food, please alternate with acetaminophen  30 tablet 0   oxyCODONE  (ROXICODONE ) 5 MG immediate release tablet Take 1 tablet (5 mg total) by mouth every 4 (four) hours as needed for severe pain (pain score 7-10) or breakthrough pain. 15 tablet 0   amLODipine  (NORVASC ) 2.5 MG tablet TAKE 1 TABLET BY MOUTH EVERY DAY 90 tablet 3   azelastine  (ASTELIN ) 0.1 % nasal spray Place 2 sprays into both nostrils 2 (two) times daily. 90 mL 3   clotrimazole -betamethasone  (LOTRISONE ) cream APPLY TO AFFECTED AREA TWICE A DAY 45 g 1   Continuous Blood Gluc Sensor (FREESTYLE LIBRE 3 SENSOR) MISC Quantity: 2 sensors/month NDC# [phone] Sensor Refills: PRN or 12 refills annually 2 each 12   diclofenac  Sodium (VOLTAREN ) 1 % GEL Apply 4 g topically 4 (four) times daily. To affected joint. 100 g 11   fluticasone  (FLONASE ) 50 MCG/ACT nasal spray SPRAY 2 SPRAYS INTO EACH NOSTRIL EVERY DAY 48 mL 3   gabapentin  (NEURONTIN ) 300 MG capsule TAKE 2 CAPSULES BY MOUTH AT BEDTIME 180 capsule 1   hydrochlorothiazide  (HYDRODIURIL ) 25 MG tablet TAKE 1 TABLET (25 MG TOTAL) BY MOUTH DAILY. 90 tablet 3   ipratropium (ATROVENT ) 0.03 % nasal spray PLACE 2 SPRAYS INTO BOTH NOSTRILS EVERY 12 (TWELVE) HOURS. 90 mL 2   meloxicam  (MOBIC ) 7.5 MG tablet TAKE 1 TABLET (7.5 MG TOTAL) BY MOUTH DAILY AS NEEDED. FOR PAIN 90 tablet 1   pantoprazole  (PROTONIX ) 40 MG tablet TAKE 1 TABLET BY MOUTH EVERY DAY 90 tablet 3   predniSONE  (DELTASONE ) 50 MG tablet Take 1 tablet (50 mg total) by  mouth daily. 5 tablet 0   promethazine -dextromethorphan (PROMETHAZINE -DM) 6.25-15 MG/5ML syrup Take 5 mLs by mouth 4 (four) times daily as needed. 118 mL 0   rosuvastatin  (CRESTOR ) 10 MG tablet TAKE 1 TABLET (10 MG TOTAL) BY MOUTH ONCE A WEEK. 13 tablet 3   Semaglutide ,0.25 or 0.5MG /DOS, (OZEMPIC , 0.25 OR 0.5 MG/DOSE,) 2 MG/3ML SOPN INJECT 0.25 MG AS DIRECTED ONCE A WEEK FOR 28 DAYS, THEN 0.5 MG ONCE A WEEK FOR 28 DAYS 3 mL 5   tiZANidine  (ZANAFLEX ) 4 MG tablet  Take 0.5-1 tablets (2-4 mg total) by mouth every 8 (eight) hours as needed for muscle spasms. 30 tablet 2      No current facility-administered medications for this visit.      Imaging Results (Last 48 hours)  No results found.     Review of Systems:   A ROS was performed including pertinent positives and negatives as documented in the HPI.   Physical Exam :   Constitutional: NAD and appears stated age Neurological: Alert and oriented Psych: Appropriate affect and cooperative Last menstrual period 06/05/2018.    Comprehensive Musculoskeletal Exam:     Tenderness palpation about the biceps tendon as well as anterior superior greater tuberosity.  Forward elevation is 170 degrees equal to the contralateral side.  External rotation is to 50 degrees at the side.  Internal rotation is to L1 bilaterally.  Positive Neer impingement on the right.  There is positive speeds test.  Distal neurosensory exam is intact     Imaging:   Xray (3 views right shoulder): AC joint arthritis   MRI (right shoulder): There is arthritis involving the [facility] joint with evidence of significant subacromial impingement cystic changes of the humeral head as well as fluid around the biceps tendon consistent with biceps tear     I personally reviewed and interpreted the radiographs.     Assessment and Plan:   53 y.o. female with right shoulder rotator cuff tendinitis in the setting of subacromial spurring and an AC joint spur.  Overall I did describe that given the fact that she has tried multiple injections as well as physical therapy I do believe she would be a candidate for arthroscopic intervention.  Specifically I did discuss an acromioplasty with distal clavicle resection.  She is tender over the biceps as well and to that effect she did be significant evidence of biceps tendinitis I would recommend a subpectoral biceps tenodesis.  I did discuss the risks and limitations as well as associated rehab time.  At  this time after consideration of all of this she is elected for surgical intervention   -Plan for right shoulder arthroscopy with subacromial debridement, distal clavicle resection, possible subpectoral biceps tenodesis     After a lengthy discussion of treatment options, including risks, benefits, alternatives, complications of surgical and nonsurgical conservative options, the patient elected surgical repair.    The patient  is aware of the material risks  and complications including, but not limited to injury to adjacent structures, neurovascular injury, infection, numbness, bleeding, implant failure, thermal burns, stiffness, persistent pain, failure to heal, disease transmission from allograft, need for further surgery, dislocation, anesthetic risks, blood clots, risks of death,and others. The probabilities of surgical success and failure discussed with patient given their particular co-morbidities.The time and nature of expected rehabilitation and recovery was discussed.The patient's questions were all answered preoperatively.  No barriers to understanding were noted. I explained the natural history of the disease process and Rx rationale.  I explained  to the patient what I considered to be reasonable expectations given their personal situation.  The final treatment plan was arrived at through a shared patient decision making process model.     I personally saw and evaluated the patient, and participated in the management and treatment plan.   Wilhelmenia Harada, MD Attending Physician, Orthopedic Surgery   This document was dictated using Dragon voice recognition software. A reasonable attempt at proof reading has been made to minimize errors

## 2023-06-13 ENCOUNTER — Encounter (HOSPITAL_BASED_OUTPATIENT_CLINIC_OR_DEPARTMENT_OTHER): Payer: Self-pay | Admitting: Orthopaedic Surgery

## 2023-06-14 ENCOUNTER — Ambulatory Visit (HOSPITAL_BASED_OUTPATIENT_CLINIC_OR_DEPARTMENT_OTHER): Payer: Self-pay | Attending: Orthopaedic Surgery | Admitting: Physical Therapy

## 2023-06-14 ENCOUNTER — Encounter (HOSPITAL_BASED_OUTPATIENT_CLINIC_OR_DEPARTMENT_OTHER): Payer: Self-pay | Admitting: Physical Therapy

## 2023-06-14 ENCOUNTER — Other Ambulatory Visit: Payer: Self-pay

## 2023-06-14 DIAGNOSIS — M6281 Muscle weakness (generalized): Secondary | ICD-10-CM | POA: Diagnosis present

## 2023-06-14 DIAGNOSIS — M7541 Impingement syndrome of right shoulder: Secondary | ICD-10-CM | POA: Insufficient documentation

## 2023-06-14 DIAGNOSIS — M25611 Stiffness of right shoulder, not elsewhere classified: Secondary | ICD-10-CM | POA: Diagnosis present

## 2023-06-14 DIAGNOSIS — M25511 Pain in right shoulder: Secondary | ICD-10-CM | POA: Diagnosis present

## 2023-06-14 NOTE — Therapy (Signed)
 OUTPATIENT PHYSICAL THERAPY SHOULDER EVALUATION   Patient Name: Tonya Suarez MRN: [id number] DOB:[birth year], 53 y.o., female Today's Date: 06/15/2023  END OF SESSION:  PT End of Session - 06/14/23 1543     Visit Number 1    Number of Visits 29    Date for PT Re-Evaluation 09/06/23    Authorization Type [employer]    PT Start Time 1541    PT Stop Time 1629    PT Time Calculation (min) 48 min    Activity Tolerance Patient tolerated treatment well    Behavior During Therapy WFL for tasks assessed/performed             Past Medical History:  Diagnosis Date   Abnormal Pap smear of cervix    ascus hpv hr neg   Allergy    Arthritis    Chronic headaches    Depression    Diabetes mellitus without complication (HCC)    GERD (gastroesophageal reflux disease)    Hypertension    Low back pain    UTI (urinary tract infection)    Past Surgical History:  Procedure Laterality Date   CERVICAL BIOPSY  W/ LOOP ELECTRODE EXCISION     COLPOSCOPY     CIN2/3   SHOULDER ARTHROSCOPY WITH DISTAL CLAVICLE RESECTION Right 06/12/2023   Procedure: RIGHT SHOULDER ARTHROSCOPY WITH DISTAL CLAVICLE RESECTION, SUBPECTORIAL TENODESIS;  Surgeon: Wilhelmenia Harada, MD;  Location: [facility];  Service: Orthopedics;  Laterality: Right;   SUBACROMIAL DECOMPRESSION Right 06/12/2023   Procedure: RIGHT SUBACROMIAL DECOMPRESSION;  Surgeon: Wilhelmenia Harada, MD;  Location: [facility];  Service: Orthopedics;  Laterality: Right;   TUBAL LIGATION     Patient Active Problem List   Diagnosis Date Noted   Impingement syndrome of right shoulder 06/12/2023   Arthritis of right acromioclavicular joint 06/12/2023   Biceps tendinitis of right upper extremity 06/12/2023   Hyperlipidemia associated with type 2 diabetes mellitus (HCC) 05/25/2021   Controlled diabetes mellitus (HCC) 05/13/2021   Recurrent major depressive disorder, in full remission (HCC) 05/11/2021   Bilateral sciatica  12/08/2020   Mass of thigh, left 08/26/2020   Pain in right hand 09/27/2018   Former smoker 07/19/2018   Constipation 07/19/2018   B12 deficiency 07/19/2018   Restless legs 07/19/2018   Ingrown toenail 12/20/2016   History of adenomatous polyp of colon 09/23/2016   Migraines 05/06/2016   Family history of cancer of GI tract 12/06/2012   HTN (hypertension) 11/21/2011   MENORRHAGIA 09/23/2009   PLANTAR FASCIITIS 09/20/2007   Osteoarthritis 09/19/2006   Depression 09/06/2006   Allergic rhinitis 09/06/2006   GERD 09/06/2006    PCP: ***  REFERRING PROVIDER: Wilhelmenia Harada, MD  REFERRING DIAG: M75.41 (ICD-10-CM) - Impingement syndrome of right shoulder  S/p Arthroscopic limited debridement  Arthroscopic distal clavicle excision  Arthroscopic subacromial decompression   Arthroscopic biceps tenodesis   THERAPY DIAG:  Right shoulder pain, unspecified chronicity  Stiffness of right shoulder, not elsewhere classified  Muscle weakness (generalized)  Rationale for Evaluation and Treatment: Rehabilitation  ONSET DATE: DOS  06/12/2023  SUBJECTIVE:  SUBJECTIVE STATEMENT: Pt reports having pain for a few years.  She reports no specific MOI.  She was receiving cortisone injections though they stopped working as well.  Pt underwent R shoulder arthroscopic biceps tenodesis, subacromial decompression, DCE, and limited debridement on 06/12/23.  Post op on post op note indicated to follow the biceps tenodesis protocol. She will be allowed overhead motion as tolerated once her block wears off.  Pt has been using the [employer].    Pt is sling dependent.  She is limited with all ADLs and IADLs.  Pt is unable to perform work activities.       Hand dominance: Right  PERTINENT HISTORY: R shoulder arthroscopic biceps  tenodesis, subacromial decompression, DCE, and limited debridement on 06/12/23 DM type 2 Arthritis HTN LBP  PAIN:  Are you having pain? Yes NPRS:  intermittent pain, 0/10 currently but can reach 7-8/10 ; 9/10 worst Location:  R shoulder, anterior and anterolaterl  PRECAUTIONS: {Therapy precautions:24002}    WEIGHT BEARING RESTRICTIONS: Yes R UE  FALLS:  Has patient fallen in last 6 months? 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    PLOF: Independent  PATIENT GOALS: to be able sleep well without shoulder pain.  To perform activities without pain  NEXT MD VISIT:  06/28/23  OBJECTIVE:  Note: Objective measures were completed at Evaluation unless otherwise noted.  DIAGNOSTIC FINDINGS:  Pt is post op.  Pt had a MRI before surgery.  PATIENT SURVEYS:  {rehab surveys:24030:a}  COGNITION: Overall cognitive status: Within functional limits for tasks assessed     OBSERVATION: Pt in sling  POSTURE: ***  UPPER EXTREMITY ROM:   {AROM/PROM:27142} ROM Right eval Left eval  Shoulder flexion    Shoulder extension    Shoulder abduction    Shoulder adduction    Shoulder internal rotation    Shoulder external rotation    Elbow flexion    Elbow extension    Wrist flexion    Wrist extension    Wrist ulnar deviation    Wrist radial deviation    Wrist pronation    Wrist supination    (Blank rows = not tested)  UPPER EXTREMITY MMT:  MMT Right eval Left eval  Shoulder flexion    Shoulder extension    Shoulder abduction    Shoulder adduction    Shoulder internal rotation    Shoulder external rotation    Middle trapezius    Lower trapezius    Elbow flexion    Elbow extension    Wrist flexion    Wrist extension    Wrist ulnar deviation    Wrist radial deviation    Wrist pronation    Wrist supination     Grip strength (lbs)    (Blank rows = not tested)  SHOULDER SPECIAL TESTS: Impingement tests: {shoulder impingement test:25231:a} SLAP lesions: {SLAP lesions:25232} Instability tests: {shoulder instability test:25233} Rotator cuff assessment: {rotator cuff assessment:25234} Biceps assessment: {biceps assessment:25235}  JOINT MOBILITY TESTING:  ***  PALPATION:  ***  TREATMENT DATE: ***  Wrist flex/ext AROM in sling, hand pumps in sling. Possible pendulums---- PATIENT EDUCATION: Education details: *** Person educated: {Person educated:25204} Education method: {Education Method:25205} Education comprehension: {Education Comprehension:25206}  HOME EXERCISE PROGRAM:   ASSESSMENT:  CLINICAL IMPRESSION: Patient is a 53 y.o. female  2 days s/p R shoulder arthroscopic biceps tenodesis, subacromial decompression, DCE, and limited debridement   OBJECTIVE IMPAIRMENTS: {opptimpairments:25111}.   ACTIVITY LIMITATIONS: {activitylimitations:27494}  PARTICIPATION LIMITATIONS: {participationrestrictions:25113}  PERSONAL FACTORS: {Personal factors:25162} are also affecting patient's functional outcome.   REHAB POTENTIAL: Good  CLINICAL DECISION MAKING: Stable/uncomplicated  EVALUATION COMPLEXITY: Low   GOALS: Goals reviewed with patient? {yes/no:20286}  SHORT TERM GOALS: Target date: ***  *** Baseline: Goal status: INITIAL  2.  *** Baseline:  Goal status: INITIAL  3.  *** Baseline:  Goal status: INITIAL  4.  *** Baseline:  Goal status: INITIAL  5.  *** Baseline:  Goal status: INITIAL  6.  *** Baseline:  Goal status: INITIAL  LONG TERM GOALS: Target date: ***  *** Baseline:  Goal status: INITIAL  2.  *** Baseline:  Goal status: INITIAL  3.  *** Baseline:  Goal status: INITIAL  4.  *** Baseline:  Goal status:  INITIAL  5.  *** Baseline:  Goal status: INITIAL  6.  *** Baseline:  Goal status: INITIAL  PLAN:  PT FREQUENCY:  1x/wk x 3-4 weeks and 2x/wk afterwards  PT DURATION: other: 16 weeks  PLANNED INTERVENTIONS: {rehab planned interventions:25118::"97110-Therapeutic exercises","97530- Therapeutic [phone]- Neuromuscular re-education","97535- Self QION","62952- Manual therapy"}  PLAN FOR NEXT SESSION: Marnie Siren, PT 06/15/2023, 9:13 PM

## 2023-06-21 ENCOUNTER — Ambulatory Visit (HOSPITAL_BASED_OUTPATIENT_CLINIC_OR_DEPARTMENT_OTHER): Payer: Self-pay | Admitting: Physical Therapy

## 2023-06-21 ENCOUNTER — Encounter (HOSPITAL_BASED_OUTPATIENT_CLINIC_OR_DEPARTMENT_OTHER): Payer: Self-pay | Admitting: Physical Therapy

## 2023-06-21 DIAGNOSIS — M6281 Muscle weakness (generalized): Secondary | ICD-10-CM

## 2023-06-21 DIAGNOSIS — M25611 Stiffness of right shoulder, not elsewhere classified: Secondary | ICD-10-CM

## 2023-06-21 DIAGNOSIS — M25511 Pain in right shoulder: Secondary | ICD-10-CM | POA: Diagnosis not present

## 2023-06-21 NOTE — Therapy (Signed)
 OUTPATIENT PHYSICAL THERAPY SHOULDER TREATMENT   Patient Name: Tonya Suarez MRN: [id number] DOB:09/25/1970, 53 y.o., female Today's Date: 06/22/2023  END OF SESSION:  PT End of Session - 06/21/23 1544     Visit Number 2    Number of Visits 29    Date for PT Re-Evaluation 09/06/23    Authorization Type U[employer]    PT Start Time 1540    PT Stop Time 1625    PT Time Calculation (min) 45 min    Activity Tolerance Patient tolerated treatment well    Behavior During Therapy WFL for tasks assessed/performed              Past Medical History:  Diagnosis Date   Abnormal Pap smear of cervix    ascus hpv hr neg   Allergy    Arthritis    Chronic headaches    Depression    Diabetes mellitus without complication (HCC)    GERD (gastroesophageal reflux disease)    Hypertension    Low back pain    UTI (urinary tract infection)    Past Surgical History:  Procedure Laterality Date   CERVICAL BIOPSY  W/ LOOP ELECTRODE EXCISION     COLPOSCOPY     CIN2/3   SHOULDER ARTHROSCOPY WITH DISTAL CLAVICLE RESECTION Right 06/12/2023   Procedure: RIGHT SHOULDER ARTHROSCOPY WITH DISTAL CLAVICLE RESECTION, SUBPECTORIAL TENODESIS;  Surgeon: Wilhelmenia Harada, MD;  Location: [facility];  Service: Orthopedics;  Laterality: Right;   SUBACROMIAL DECOMPRESSION Right 06/12/2023   Procedure: RIGHT SUBACROMIAL DECOMPRESSION;  Surgeon: Wilhelmenia Harada, MD;  Location: [facility];  Service: Orthopedics;  Laterality: Right;   TUBAL LIGATION     Patient Active Problem List   Diagnosis Date Noted   Impingement syndrome of right shoulder 06/12/2023   Arthritis of right acromioclavicular joint 06/12/2023   Biceps tendinitis of right upper extremity 06/12/2023   Hyperlipidemia associated with type 2 diabetes mellitus (HCC) 05/25/2021   Controlled diabetes mellitus (HCC) 05/13/2021   Recurrent major depressive disorder, in full remission (HCC) 05/11/2021   Bilateral sciatica  12/08/2020   Mass of thigh, left 08/26/2020   Pain in right hand 09/27/2018   Former smoker 07/19/2018   Constipation 07/19/2018   B12 deficiency 07/19/2018   Restless legs 07/19/2018   Ingrown toenail 12/20/2016   History of adenomatous polyp of colon 09/23/2016   Migraines 05/06/2016   Family history of cancer of GI tract 12/06/2012   HTN (hypertension) 11/21/2011   MENORRHAGIA 09/23/2009   PLANTAR FASCIITIS 09/20/2007   Osteoarthritis 09/19/2006   Depression 09/06/2006   Allergic rhinitis 09/06/2006   GERD 09/06/2006     REFERRING PROVIDER: Wilhelmenia Harada, MD  REFERRING DIAG: M75.41 (ICD-10-CM) - Impingement syndrome of right shoulder  S/p Arthroscopic limited debridement  Arthroscopic distal clavicle excision  Arthroscopic subacromial decompression   Arthroscopic biceps tenodesis   THERAPY DIAG:  Right shoulder pain, unspecified chronicity  Stiffness of right shoulder, not elsewhere classified  Muscle weakness (generalized)  Rationale for Evaluation and Treatment: Rehabilitation  ONSET DATE: DOS  06/12/2023  SUBJECTIVE:  SUBJECTIVE STATEMENT: Pt is 1 week and 2 days s/p R shoulder arthroscopic biceps tenodesis, subacromial decompression, DCE, and limited debridement.  Pt denies any adverse effects after prior Rx.  Pt reports compliance with HEP.  Pt states she is hurting more today.  She may have slept wrong last night.  Pt states she has a "break out" on her elbow due to surgery.  She has been putting neosporin on it and using gauze.   reports having pain for a few years.  She reports no specific MOI.  She was receiving cortisone injections though they stopped working as well.  Pt underwent R shoulder arthroscopic biceps tenodesis, subacromial decompression, DCE, and limited debridement on  06/12/23.  Post op on post op note indicated to follow the biceps tenodesis protocol. She will be allowed overhead motion as tolerated once her block wears off.  Pt has been using the [employer].    Pt is sling dependent.  She is limited with all ADLs and IADLs.  Pt is unable to perform work activities.      Hand dominance: Right  PERTINENT HISTORY: R shoulder arthroscopic biceps tenodesis, subacromial decompression, DCE, and limited debridement on 06/12/23  DM type 2, arthritis, LBP, HTN   PAIN:  Are you having pain? Yes NPRS:  6/10 currently but can reach 7-8/10 ; 9/10 worst Location:  R shoulder, anterior and anterolaterl  PRECAUTIONS: Other: per surgical protocol    WEIGHT BEARING RESTRICTIONS: Yes R UE  FALLS:  Has patient fallen in last 6 months? 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    PLOF: Independent  PATIENT GOALS: to be able sleep well without shoulder pain.  To perform activities without pain  NEXT MD VISIT:  06/28/23  OBJECTIVE:  Note: Objective measures were completed at Evaluation unless otherwise noted.  DIAGNOSTIC FINDINGS:  Pt is post op.  Pt had a MRI before surgery.  PATIENT SURVEYS:  UEFI:  28/80  COGNITION: Overall cognitive status: Within functional limits for tasks assessed     OBSERVATION: Pt in sling.  Pt has a rash on anterior surface of elbow.  Skin is dry and has no redness.  Pt states it looks better now.    Portals are intact with stitches.  Xeroform gauze over anterior and lateral portals.  Pt has no signs of infection.     UPPER EXTREMITY ROM:    ROM Right eval Left eval Right 4/30 PROM  Shoulder flexion   76  Shoulder extension     Shoulder abduction     Shoulder adduction     Shoulder internal rotation     Shoulder external rotation   -4 deg  Elbow flexion      Elbow extension     Wrist flexion     Wrist extension     Wrist ulnar deviation     Wrist radial deviation     Wrist pronation     Wrist supination     (Blank rows = not tested)  TREATMENT:   PT removed dressings and applied new gauze and tegaderm over portals.    Pendulums 3x10 cw and ccw wrist flex/ext AROM in sling x20 reps hand pumps in sling x 20 reps.   Pt received a HEP handout and was educated in correct form and appropriate frequency.  PT instructed pt she should not have pain with exercise.   Pt received R shoulder PROM in flexion and ER in supine per pt and tissue tolerance w/n protocol ranges.  PT assessed PROM.      PATIENT EDUCATION: Education details:  post op and protocol limitations and restrictions, dx, HEP, POC, relevant anatomy, dressings, compliance with sling, ice usage, and exercise form.  PT educated pt concerning rash on ant elbow and instructed pt to call MD if it gets worse, red, or irritated. Person educated: Patient Education method: Explanation, Demonstration, Tactile cues, Verbal cues, and Handouts Education comprehension: verbalized understanding, returned demonstration, verbal cues required, tactile cues required, and needs further education  HOME EXERCISE PROGRAM: Access Code: [email or web address] URL: [email or web address] Date: 06/16/2023 Prepared by: Marnie Siren  Exercises - Wrist AROM Flexion Extension  - 3 x daily - 7 x weekly - 2-3 sets - 10 reps - Hand pumps 3-4x/day, 2- 3 sets of 10 reps  Updated HEP: - Circular Shoulder Pendulum with Table Support  - 2-3 x daily - 7 x weekly - 1-2 sets - 10 reps  ASSESSMENT:  CLINICAL IMPRESSION: Pt has been compliant with sling usage and HEP.  PT removed dressings and applied new gauze and tegaderm over portals.  Pt has a rash on her anterior elbow.  Skin was dry  and not red.  Pt states the rash is improving and looking better.  PT instructed pt to call MD if the rash gets worse or if it gets red or irritated.  PT instructed pt in performing pendulums and she performed them well without pain.  PT updated HEP with pendulums.  PT performed PROM per pt and tissue tolerance w/n protocol ranges.  Pt tolerated PROM well.  She responded well to Rx reporting no increased pain and states it feels better after Rx.  She should benefit from skilled PT per protocol to address impairments and to assist with returning to desired level of function.    OBJECTIVE IMPAIRMENTS: decreased ROM, decreased strength, hypomobility, impaired flexibility, impaired UE functional use, and pain.   ACTIVITY LIMITATIONS: carrying, lifting, bathing, dressing, reach over head, and hygiene/grooming  PARTICIPATION LIMITATIONS: meal prep, cleaning, laundry, driving, shopping, community activity, and occupation  PERSONAL FACTORS: 1-2 comorbidities: DM type 2 arthritis  are also affecting patient's functional outcome.   REHAB POTENTIAL: Good  CLINICAL DECISION MAKING: Stable/uncomplicated  EVALUATION COMPLEXITY: Low   GOALS:   SHORT TERM GOALS:   Pt will be independent with HEP for improved pain, ROM, and function.  Baseline: Goal status: INITIAL Target date:   07/12/2023  2.  Pt will wean out of sling per MD instruction without adverse effects.  Baseline:  Goal status: INITIAL Target date:   07/12/2023  3.  Pt will progress with PROM and AAROM per protocol without adverse effects for improved shoulder mobility and function. Baseline:  Goal status: INITIAL Target date:   07/12/2023  4.  Pt will demo R shoulder PROM/AAROM to be at least 140/120 deg in flexion (supine) and 50/45 deg in ER for improved shoulder mobility and stiffness. Baseline:  Goal status: INITIAL Target date:   07/26/2023   5.  Pt will demo at  least 100 deg of R shoulder elevation in standing without  significant UT compensatory hike.   Baseline:  Goal status: INITIAL Target date:   08/09/2023  6.  Ptwill be able to perform her self care activities with no > than minimal difficulty.  Baseline:  Goal status: INITIAL Target date:   09/06/2023   LONG TERM GOALS: Target date:  10/04/2023   Pt will demo R shoulder AROM to be [facility] t/o for performance of ADLs and IADLs. Baseline:  Goal status: INITIAL  2.  Pt will be able to perform her ADLs and IADLs including household chores without significant difficulty and pain.  Baseline:  Goal status: INITIAL  3.  Pt will be able to perform her normal reaching and overhead activities without significant limitation and pain.  Baseline:  Goal status: INITIAL  4.  Pt will return to work with expected limitations without adverse effects.   Baseline:  Goal status: INITIAL  5.  Pt will demo R shoulder strength to be at least 4+/5 MMT in flexion, ER, and IR and 4/5 strength in abduction for performance of work activities and functional lifting/carrying.   Baseline:  Goal status: INITIAL   PLAN:  PT FREQUENCY:  1x/wk x 3-4 weeks and 2x/wk afterwards  PT DURATION: other: 16 weeks  PLANNED INTERVENTIONS: 97164- PT Re-evaluation, 97750- Physical Performance Testing, 97110-Therapeutic exercises, 97530- Therapeutic activity, [id number]- Neuromuscular re-education, 97535- Self Care, 16109- Manual therapy, [id number]- Aquatic Therapy, [id number]- Electrical stimulation (unattended), [id number]- Electrical stimulation (manual), 97035- Ultrasound, Patient/Family education, Taping, Dry Needling, Joint mobilization, Spinal mobilization, Scar mobilization, Cryotherapy, and Moist heat  PLAN FOR NEXT SESSION: cont per biceps tenodesis protocol.  Review and perform HEP.  PROM per protocol.    Trina Fujita III PT, DPT 06/22/23 1:07 PM

## 2023-06-26 ENCOUNTER — Encounter (HOSPITAL_BASED_OUTPATIENT_CLINIC_OR_DEPARTMENT_OTHER): Admitting: Orthopaedic Surgery

## 2023-06-28 ENCOUNTER — Encounter (HOSPITAL_BASED_OUTPATIENT_CLINIC_OR_DEPARTMENT_OTHER): Payer: Self-pay | Admitting: Physical Therapy

## 2023-06-28 ENCOUNTER — Ambulatory Visit (HOSPITAL_BASED_OUTPATIENT_CLINIC_OR_DEPARTMENT_OTHER): Payer: Self-pay | Attending: Orthopaedic Surgery | Admitting: Physical Therapy

## 2023-06-28 ENCOUNTER — Ambulatory Visit (INDEPENDENT_AMBULATORY_CARE_PROVIDER_SITE_OTHER): Admitting: Orthopaedic Surgery

## 2023-06-28 DIAGNOSIS — M25611 Stiffness of right shoulder, not elsewhere classified: Secondary | ICD-10-CM | POA: Insufficient documentation

## 2023-06-28 DIAGNOSIS — M6281 Muscle weakness (generalized): Secondary | ICD-10-CM | POA: Diagnosis present

## 2023-06-28 DIAGNOSIS — M7541 Impingement syndrome of right shoulder: Secondary | ICD-10-CM

## 2023-06-28 DIAGNOSIS — M25511 Pain in right shoulder: Secondary | ICD-10-CM | POA: Insufficient documentation

## 2023-06-28 NOTE — Therapy (Signed)
 OUTPATIENT PHYSICAL THERAPY SHOULDER TREATMENT   Patient Name: Tonya Suarez MRN: [id number] DOB:04/29/1970, 53 y.o., female Today's Date: 06/28/2023  END OF SESSION:  PT End of Session - 06/28/23 1522     Visit Number 3    Number of Visits 29    Date for PT Re-Evaluation 09/06/23    Authorization Type UHC    PT Start Time 1522    PT Stop Time 1600    PT Time Calculation (min) 38 min    Activity Tolerance Patient tolerated treatment well    Behavior During Therapy WFL for tasks assessed/performed              Past Medical History:  Diagnosis Date   Abnormal Pap smear of cervix    ascus hpv hr neg   Allergy    Arthritis    Chronic headaches    Depression    Diabetes mellitus without complication (HCC)    GERD (gastroesophageal reflux disease)    Hypertension    Low back pain    UTI (urinary tract infection)    Past Surgical History:  Procedure Laterality Date   CERVICAL BIOPSY  W/ LOOP ELECTRODE EXCISION     COLPOSCOPY     CIN2/3   SHOULDER ARTHROSCOPY WITH DISTAL CLAVICLE RESECTION Right 06/12/2023   Procedure: RIGHT SHOULDER ARTHROSCOPY WITH DISTAL CLAVICLE RESECTION, SUBPECTORIAL TENODESIS;  Surgeon: Wilhelmenia Harada, MD;  Location: [facility];  Service: Orthopedics;  Laterality: Right;   SUBACROMIAL DECOMPRESSION Right 06/12/2023   Procedure: RIGHT SUBACROMIAL DECOMPRESSION;  Surgeon: Wilhelmenia Harada, MD;  Location: [facility];  Service: Orthopedics;  Laterality: Right;   TUBAL LIGATION     Patient Active Problem List   Diagnosis Date Noted   Impingement syndrome of right shoulder 06/12/2023   Arthritis of right acromioclavicular joint 06/12/2023   Biceps tendinitis of right upper extremity 06/12/2023   Hyperlipidemia associated with type 2 diabetes mellitus (HCC) 05/25/2021   Controlled diabetes mellitus (HCC) 05/13/2021   Recurrent major depressive disorder, in full remission (HCC) 05/11/2021   Bilateral sciatica  12/08/2020   Mass of thigh, left 08/26/2020   Pain in right hand 09/27/2018   Former smoker 07/19/2018   Constipation 07/19/2018   B12 deficiency 07/19/2018   Restless legs 07/19/2018   Ingrown toenail 12/20/2016   History of adenomatous polyp of colon 09/23/2016   Migraines 05/06/2016   Family history of cancer of GI tract 12/06/2012   HTN (hypertension) 11/21/2011   MENORRHAGIA 09/23/2009   PLANTAR FASCIITIS 09/20/2007   Osteoarthritis 09/19/2006   Depression 09/06/2006   Allergic rhinitis 09/06/2006   GERD 09/06/2006     REFERRING PROVIDER: Wilhelmenia Harada, MD  REFERRING DIAG: M75.41 (ICD-10-CM) - Impingement syndrome of right shoulder  S/p Arthroscopic limited debridement  Arthroscopic distal clavicle excision  Arthroscopic subacromial decompression   Arthroscopic biceps tenodesis   THERAPY DIAG:  Right shoulder pain, unspecified chronicity  Stiffness of right shoulder, not elsewhere classified  Muscle weakness (generalized)  Rationale for Evaluation and Treatment: Rehabilitation  ONSET DATE: DOS  06/12/2023  SUBJECTIVE:  SUBJECTIVE STATEMENT: Patient states shoulder is doing alright. Doing HEP.   reports having pain for a few years.  She reports no specific MOI.  She was receiving cortisone injections though they stopped working as well.  Pt underwent R shoulder arthroscopic biceps tenodesis, subacromial decompression, DCE, and limited debridement on 06/12/23.  Post op on post op note indicated to follow the biceps tenodesis protocol. She will be allowed overhead motion as tolerated once her block wears off.  Pt has been using the [employer].    Pt is sling dependent.  She is limited with all ADLs and IADLs.  Pt is unable to perform work activities.      Hand dominance: Right  PERTINENT  HISTORY: R shoulder arthroscopic biceps tenodesis, subacromial decompression, DCE, and limited debridement on 06/12/23  DM type 2, arthritis, LBP, HTN   PAIN:  Are you having pain? Yes NPRS:  0/10 currently but can reach 7-8/10 ; 9/10 worst Location:  R shoulder, anterior and anterolaterl  PRECAUTIONS: Other: per surgical protocol    WEIGHT BEARING RESTRICTIONS: Yes R UE  FALLS:  Has patient fallen in last 6 months? 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    PLOF: Independent  PATIENT GOALS: to be able sleep well without shoulder pain.  To perform activities without pain  NEXT MD VISIT:  06/28/23  OBJECTIVE:  Note: Objective measures were completed at Evaluation unless otherwise noted.  DIAGNOSTIC FINDINGS:  Pt is post op.  Pt had a MRI before surgery.  PATIENT SURVEYS:  UEFI:  28/80  COGNITION: Overall cognitive status: Within functional limits for tasks assessed     OBSERVATION: Pt in sling.  Pt has a rash on anterior surface of elbow.  Skin is dry and has no redness.  Pt states it looks better now.    Portals are intact with stitches.  Xeroform gauze over anterior and lateral portals.  Pt has no signs of infection.     UPPER EXTREMITY ROM:    ROM Right eval Left eval Right 4/30 PROM  Shoulder flexion   76  Shoulder extension     Shoulder abduction     Shoulder adduction     Shoulder internal rotation     Shoulder external rotation   -4 deg  Elbow flexion     Elbow extension     Wrist flexion     Wrist extension     Wrist ulnar deviation     Wrist radial deviation     Wrist pronation     Wrist supination     (Blank rows = not tested)  TREATMENT:  06/28/23 PROM  flexion, ER  Supine well arm assisted flexion 1 x 10 Supine AAROM ER with cane 1 x 10 Supine AAROM flexion with cane 1 x 10 Seated scap retractions 2 x 10   06/21/23 PT removed dressings and applied new gauze and tegaderm over portals.    Pendulums 3x10 cw and ccw wrist flex/ext AROM in sling x20 reps hand pumps in sling x 20 reps.   Pt received a HEP handout and was educated in correct form and appropriate frequency.  PT instructed pt she should not have pain with exercise.   Pt received R shoulder PROM in flexion and ER in supine per pt and tissue tolerance w/n protocol ranges.  PT assessed PROM.      PATIENT EDUCATION: Education details:  post op and protocol limitations and restrictions, dx, HEP, POC, relevant anatomy, dressings, compliance with sling, ice usage, and exercise form.  PT educated pt concerning rash on ant elbow and instructed pt to call MD if it gets worse, red, or irritated. Person educated: Patient Education method: Explanation, Demonstration, Tactile cues, Verbal cues, and Handouts Education comprehension: verbalized understanding, returned demonstration, verbal cues required, tactile cues required, and needs further education  HOME EXERCISE PROGRAM: Access Code: [email or web address] URL: [email or web address] Date: 06/16/2023 Prepared by: Marnie Siren  Exercises - Wrist AROM Flexion Extension  - 3 x daily - 7 x weekly - 2-3 sets - 10 reps - Hand pumps 3-4x/day, 2- 3 sets of 10 reps  Updated HEP: - Circular Shoulder Pendulum with Table Support  - 2-3 x daily - 7 x weekly - 1-2 sets - 10 reps  ASSESSMENT:  CLINICAL IMPRESSION: Patient with improving PROM and good AAROM. Updated HEP. Patient will continue to benefit from physical therapy in order to improve function and reduce impairment.   OBJECTIVE IMPAIRMENTS: decreased ROM, decreased strength, hypomobility, impaired flexibility, impaired UE functional use, and pain.   ACTIVITY LIMITATIONS:  carrying, lifting, bathing, dressing, reach over head, and hygiene/grooming  PARTICIPATION LIMITATIONS: meal prep, cleaning, laundry, driving, shopping, community activity, and occupation  PERSONAL FACTORS: 1-2 comorbidities: DM type 2 arthritis  are also affecting patient's functional outcome.   REHAB POTENTIAL: Good  CLINICAL DECISION MAKING: Stable/uncomplicated  EVALUATION COMPLEXITY: Low   GOALS:   SHORT TERM GOALS:   Pt will be independent with HEP for improved pain, ROM, and function.  Baseline: Goal status: INITIAL Target date:   07/12/2023  2.  Pt will wean out of sling per MD instruction without adverse effects.  Baseline:  Goal status: INITIAL Target date:   07/12/2023  3.  Pt will progress with PROM and AAROM per protocol without adverse effects for improved shoulder mobility and function. Baseline:  Goal status: INITIAL Target date:   07/12/2023  4.  Pt will demo R shoulder PROM/AAROM to be at least 140/120 deg in flexion (supine) and 50/45 deg in ER for improved shoulder mobility and stiffness. Baseline:  Goal status: INITIAL Target date:   07/26/2023   5.  Pt will demo at least 100 deg of R shoulder elevation in standing without significant UT compensatory hike.   Baseline:  Goal status: INITIAL Target date:   08/09/2023  6.  Ptwill be able to perform her self care activities with no > than minimal difficulty.  Baseline:  Goal status: INITIAL Target date:   09/06/2023   LONG TERM GOALS: Target date:  10/04/2023   Pt will demo R shoulder AROM to be [facility] t/o for performance of ADLs  and IADLs. Baseline:  Goal status: INITIAL  2.  Pt will be able to perform her ADLs and IADLs including household chores without significant difficulty and pain.  Baseline:  Goal status: INITIAL  3.  Pt will be able to perform her normal reaching and overhead activities without significant limitation and pain.  Baseline:  Goal status: INITIAL  4.  Pt will return to work  with expected limitations without adverse effects.   Baseline:  Goal status: INITIAL  5.  Pt will demo R shoulder strength to be at least 4+/5 MMT in flexion, ER, and IR and 4/5 strength in abduction for performance of work activities and functional lifting/carrying.   Baseline:  Goal status: INITIAL   PLAN:  PT FREQUENCY:  1x/wk x 3-4 weeks and 2x/wk afterwards  PT DURATION: other: 16 weeks  PLANNED INTERVENTIONS: 97164- PT Re-evaluation, 97750- Physical Performance Testing, 97110-Therapeutic exercises, 97530- Therapeutic activity, [id number]- Neuromuscular re-education, 97535- Self Care, [id number]- Manual therapy, [id number]- Aquatic Therapy, [id number]- Electrical stimulation (unattended), [id number]- Electrical stimulation (manual), 97035- Ultrasound, Patient/Family education, Taping, Dry Needling, Joint mobilization, Spinal mobilization, Scar mobilization, Cryotherapy, and Moist heat  PLAN FOR NEXT SESSION: cont per biceps tenodesis protocol.  Review and perform HEP.  PROM per protocol.     Perfecto Bracket Leondre Taul, PT 06/28/2023, 3:23 PM

## 2023-06-28 NOTE — Progress Notes (Signed)
 Post Operative Evaluation    Procedure/Date of Surgery: Right shoulder arthroscopy with distal clavicle resection and subacromial decompression  Interval History:   Presents today 2 weeks status post the above procedure overall doing well.  She has been working with physical therapy.  She does have a little bit of a heat rash on her elbow.  She has been compliant with sling usage.   PMH/PSH/Family History/Social History/Meds/Allergies:    Past Medical History:  Diagnosis Date   Abnormal Pap smear of cervix    ascus hpv hr neg   Allergy    Arthritis    Chronic headaches    Depression    Diabetes mellitus without complication (HCC)    GERD (gastroesophageal reflux disease)    Hypertension    Low back pain    UTI (urinary tract infection)    Past Surgical History:  Procedure Laterality Date   CERVICAL BIOPSY  W/ LOOP ELECTRODE EXCISION     COLPOSCOPY     CIN2/3   SHOULDER ARTHROSCOPY WITH DISTAL CLAVICLE RESECTION Right 06/12/2023   Procedure: RIGHT SHOULDER ARTHROSCOPY WITH DISTAL CLAVICLE RESECTION, SUBPECTORIAL TENODESIS;  Surgeon: Wilhelmenia Harada, MD;  Location: [facility];  Service: Orthopedics;  Laterality: Right;   SUBACROMIAL DECOMPRESSION Right 06/12/2023   Procedure: RIGHT SUBACROMIAL DECOMPRESSION;  Surgeon: Wilhelmenia Harada, MD;  Location: [facility];  Service: Orthopedics;  Laterality: Right;   TUBAL LIGATION     Social History   Socioeconomic History   Marital status: Married    Spouse name: Not on file   Number of children: Not on file   Years of education: Not on file   Highest education level: Not on file  Occupational History   Not on file  Tobacco Use   Smoking status: Former    Types: Cigars   Smokeless tobacco: Never  Vaping Use   Vaping status: Never Used  Substance and Sexual Activity   Alcohol use: Yes    Alcohol/week: 0.0 - 2.0 standard drinks of alcohol    Comment: occ    Drug use: No   Sexual activity: Yes    Partners: Male    Birth control/protection: Surgical, Post-menopausal    Comment: tubal ligation  Other Topics Concern   Not on file  Social History Narrative   Married May 13th 2017. 2 children- son 88 (married) and daughter 21. Granddaughter august 2017.       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      Hobbies: time with family   Social Drivers of Corporate investment banker Strain: Not on file  Food Insecurity: Not on file  Transportation Needs: Not on file  Physical Activity: Not on file  Stress: Not on file  Social Connections: Unknown (08/03/2021)   Received from [facility], [facility]   Social Network    Social Network: Not on file   Family History  Problem Relation Age of Onset   Colon polyps Mother    Diabetes Mother    Hypertension Mother    Hyperlipidemia Mother    Kidney disease Mother        has fistula- could need dialysis at any time   Colon polyps Father    Diabetes Father        mother  Hyperlipidemia Father        mother   Hypertension Father        mother   Colon cancer Father        14s, and grandmother   Lung cancer Father        in 74s   Deep vein thrombosis Father        not during dtime of cancer. also paternal aunts reported   Cancer - Other Sister        duodenal   Stomach cancer Sister    Colon cancer Paternal Grandmother    Colon cancer Maternal Aunt    Esophageal cancer Neg Hx    Liver cancer Neg Hx    Pancreatic cancer Neg Hx    Rectal cancer Neg Hx    Allergies  Allergen Reactions   Amoxicillin Swelling    Lip swelling   Nsaids     bleeding   Current Outpatient Medications  Medication Sig Dispense Refill   amLODipine  (NORVASC ) 2.5 MG tablet TAKE 1 TABLET BY MOUTH EVERY DAY 90 tablet 3   aspirin  EC 325 MG tablet Take 1 tablet (325 mg total) by mouth daily. 14 tablet 0   azelastine  (ASTELIN ) 0.1 % nasal spray Place 2 sprays into both  nostrils 2 (two) times daily. 90 mL 3   clotrimazole -betamethasone  (LOTRISONE ) cream APPLY TO AFFECTED AREA TWICE A DAY 45 g 1   Continuous Blood Gluc Sensor (FREESTYLE LIBRE 3 SENSOR) MISC Quantity: 2 sensors/month NDC# [phone] Sensor Refills: PRN or 12 refills annually 2 each 12   diclofenac  Sodium (VOLTAREN ) 1 % GEL Apply 4 g topically 4 (four) times daily. To affected joint. 100 g 11   fluticasone  (FLONASE ) 50 MCG/ACT nasal spray SPRAY 2 SPRAYS INTO EACH NOSTRIL EVERY DAY 48 mL 3   gabapentin  (NEURONTIN ) 300 MG capsule TAKE 2 CAPSULES BY MOUTH AT BEDTIME 180 capsule 1   hydrochlorothiazide  (HYDRODIURIL ) 25 MG tablet TAKE 1 TABLET (25 MG TOTAL) BY MOUTH DAILY. 90 tablet 3   ipratropium (ATROVENT ) 0.03 % nasal spray PLACE 2 SPRAYS INTO BOTH NOSTRILS EVERY 12 (TWELVE) HOURS. 90 mL 2   meloxicam  (MOBIC ) 7.5 MG tablet TAKE 1 TABLET (7.5 MG TOTAL) BY MOUTH DAILY AS NEEDED. FOR PAIN 90 tablet 1   oxyCODONE  (ROXICODONE ) 5 MG immediate release tablet Take 1 tablet (5 mg total) by mouth every 4 (four) hours as needed for severe pain (pain score 7-10) or breakthrough pain. (Patient not taking: Reported on 05/18/2023) 15 tablet 0   pantoprazole  (PROTONIX ) 40 MG tablet TAKE 1 TABLET BY MOUTH EVERY DAY 90 tablet 3   rosuvastatin  (CRESTOR ) 10 MG tablet TAKE 1 TABLET (10 MG TOTAL) BY MOUTH ONCE A WEEK. 13 tablet 3   Semaglutide ,0.25 or 0.5MG /DOS, (OZEMPIC , 0.25 OR 0.5 MG/DOSE,) 2 MG/3ML SOPN INJECT 0.25 MG AS DIRECTED ONCE A WEEK FOR 28 DAYS, THEN 0.5 MG ONCE A WEEK FOR 28 DAYS 3 mL 5   tiZANidine  (ZANAFLEX ) 4 MG tablet Take 0.5-1 tablets (2-4 mg total) by mouth every 8 (eight) hours as needed for muscle spasms. 30 tablet 2   No current facility-administered medications for this visit.   No results found.  Review of Systems:   A ROS was performed including pertinent positives and negatives as documented in the HPI.   Musculoskeletal Exam:    Right shoulder incisions are well-appearing without  erythema or drainage.  There is a small rash about the anterior aspect of the elbow.  In the spine position she  can forward elevate to 90 degrees actively with external rotation at side to 30.  Distal neurosensory exam is intact  Imaging:      I personally reviewed and interpreted the radiographs.   Assessment:   Weeks status post right shoulder distal clavicle resection subacromial decompression overall doing well.  This time I would like her to work on active range of motion as well as active range of motion as tolerated.  I will plan to see her back in 2 weeks for possible return to work at that time.  I have still asked for no heavy lifting particularly with the biceps  Plan :    - Return to clinic 2 weeks for reassessment, out of work until that time      I personally saw and evaluated the patient, and participated in the management and treatment plan.  Wilhelmenia Harada, MD Attending Physician, Orthopedic Surgery  This document was dictated using Dragon voice recognition software. A reasonable attempt at proof reading has been made to minimize errors.

## 2023-07-04 ENCOUNTER — Other Ambulatory Visit: Payer: Self-pay | Admitting: Family Medicine

## 2023-07-05 ENCOUNTER — Ambulatory Visit (HOSPITAL_BASED_OUTPATIENT_CLINIC_OR_DEPARTMENT_OTHER): Payer: Self-pay

## 2023-07-05 NOTE — Telephone Encounter (Signed)
 Last OV 03/21/23 Next OV not scheduled  Last refill 06/16/22 Qty #30/2

## 2023-07-08 ENCOUNTER — Other Ambulatory Visit: Payer: Self-pay | Admitting: Family Medicine

## 2023-07-12 ENCOUNTER — Ambulatory Visit (INDEPENDENT_AMBULATORY_CARE_PROVIDER_SITE_OTHER): Admitting: Orthopaedic Surgery

## 2023-07-12 ENCOUNTER — Ambulatory Visit (HOSPITAL_BASED_OUTPATIENT_CLINIC_OR_DEPARTMENT_OTHER): Payer: Self-pay | Admitting: Physical Therapy

## 2023-07-12 ENCOUNTER — Encounter (HOSPITAL_BASED_OUTPATIENT_CLINIC_OR_DEPARTMENT_OTHER): Payer: Self-pay | Admitting: Physical Therapy

## 2023-07-12 DIAGNOSIS — M6281 Muscle weakness (generalized): Secondary | ICD-10-CM

## 2023-07-12 DIAGNOSIS — M25511 Pain in right shoulder: Secondary | ICD-10-CM | POA: Diagnosis not present

## 2023-07-12 DIAGNOSIS — M25611 Stiffness of right shoulder, not elsewhere classified: Secondary | ICD-10-CM

## 2023-07-12 DIAGNOSIS — M7541 Impingement syndrome of right shoulder: Secondary | ICD-10-CM

## 2023-07-12 NOTE — Therapy (Signed)
 OUTPATIENT PHYSICAL THERAPY SHOULDER TREATMENT   Patient Name: Tonya Suarez MRN: [id number] DOB:05-Oct-1970, 53 y.o., female Today's Date: 07/13/2023  END OF SESSION:  PT End of Session - 07/12/23 1541     Visit Number 4    Number of Visits 29    Date for PT Re-Evaluation 09/06/23    Authorization Type [employer]    PT Start Time 1540    PT Stop Time 1619    PT Time Calculation (min) 39 min    Activity Tolerance Patient tolerated treatment well    Behavior During Therapy WFL for tasks assessed/performed              Past Medical History:  Diagnosis Date   Abnormal Pap smear of cervix    ascus hpv hr neg   Allergy    Arthritis    Chronic headaches    Depression    Diabetes mellitus without complication (HCC)    GERD (gastroesophageal reflux disease)    Hypertension    Low back pain    UTI (urinary tract infection)    Past Surgical History:  Procedure Laterality Date   CERVICAL BIOPSY  W/ LOOP ELECTRODE EXCISION     COLPOSCOPY     CIN2/3   SHOULDER ARTHROSCOPY WITH DISTAL CLAVICLE RESECTION Right 06/12/2023   Procedure: RIGHT SHOULDER ARTHROSCOPY WITH DISTAL CLAVICLE RESECTION, SUBPECTORIAL TENODESIS;  Surgeon: Wilhelmenia Harada, MD;  Location: [facility];  Service: Orthopedics;  Laterality: Right;   SUBACROMIAL DECOMPRESSION Right 06/12/2023   Procedure: RIGHT SUBACROMIAL DECOMPRESSION;  Surgeon: Wilhelmenia Harada, MD;  Location: [facility];  Service: Orthopedics;  Laterality: Right;   TUBAL LIGATION     Patient Active Problem List   Diagnosis Date Noted   Impingement syndrome of right shoulder 06/12/2023   Arthritis of right acromioclavicular joint 06/12/2023   Biceps tendinitis of right upper extremity 06/12/2023   Hyperlipidemia associated with type 2 diabetes mellitus (HCC) 05/25/2021   Controlled diabetes mellitus (HCC) 05/13/2021   Recurrent major depressive disorder, in full remission (HCC) 05/11/2021   Bilateral sciatica  12/08/2020   Mass of thigh, left 08/26/2020   Pain in right hand 09/27/2018   Former smoker 07/19/2018   Constipation 07/19/2018   B12 deficiency 07/19/2018   Restless legs 07/19/2018   Ingrown toenail 12/20/2016   History of adenomatous polyp of colon 09/23/2016   Migraines 05/06/2016   Family history of cancer of GI tract 12/06/2012   HTN (hypertension) 11/21/2011   MENORRHAGIA 09/23/2009   PLANTAR FASCIITIS 09/20/2007   Osteoarthritis 09/19/2006   Depression 09/06/2006   Allergic rhinitis 09/06/2006   GERD 09/06/2006     REFERRING PROVIDER: Wilhelmenia Harada, MD  REFERRING DIAG: M75.41 (ICD-10-CM) - Impingement syndrome of right shoulder  S/p Arthroscopic limited debridement  Arthroscopic distal clavicle excision  Arthroscopic subacromial decompression   Arthroscopic biceps tenodesis   THERAPY DIAG:  Right shoulder pain, unspecified chronicity  Stiffness of right shoulder, not elsewhere classified  Muscle weakness (generalized)  Rationale for Evaluation and Treatment: Rehabilitation  ONSET DATE: DOS  06/12/2023  SUBJECTIVE:  SUBJECTIVE STATEMENT: Pt is 4 weeks and 2 days s/p R shoulder arthroscopic biceps tenodesis, subacromial decompression, DCE.  Patient states her pain comes and goes.  Pt can have an 8/10 when "it hits".  Pt reports she had a little soreness, but no adverse effects after prior Rx.  Pt reports improved mobility in shoulder.  Pt reports compliance with HEP.  Pt saw MD prior to PT appt today.  He released her back to work with restrictions/limitations.          Hand dominance: Right  PERTINENT HISTORY: R shoulder arthroscopic biceps tenodesis, subacromial decompression, DCE, and limited debridement on 06/12/23  DM type 2, arthritis, LBP, HTN   PAIN:  Are you having  pain? Yes NPRS:  0/10 currently but can reach 8/10 ; 9/10 worst Location:  R shoulder, anterior and anterolaterl  PRECAUTIONS: Other: per surgical protocol    WEIGHT BEARING RESTRICTIONS: Yes R UE  FALLS:  Has patient fallen in last 6 months? 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    PLOF: Independent  PATIENT GOALS: to be able sleep well without shoulder pain.  To perform activities without pain  NEXT MD VISIT:  06/28/23  OBJECTIVE:  Note: Objective measures were completed at Evaluation unless otherwise noted.  DIAGNOSTIC FINDINGS:  Pt is post op.  Pt had a MRI before surgery.                                                                                                                               TREATMENT:  5/21 Reviewed pt presentation, HEP compliance, pain level, and response to prior Rx.   Pt received R shoulder PROM in flexion, scaption, ER, and IR in supine per pt and tissue tolerance w/n protocol ranges  Supine wand flexion 2x10 Supine wand ER 2x10  Supine active flexion x 10 reps   06/28/23 PROM flexion, ER  Supine well arm assisted flexion 1 x 10 Supine AAROM ER with cane 1 x 10 Supine AAROM flexion with cane 1 x 10 Seated scap retractions 2 x 10   06/21/23 PT removed dressings and applied new gauze and tegaderm over portals.    Pendulums 3x10 cw and ccw wrist flex/ext AROM in sling x20 reps hand pumps in sling x 20 reps.   Pt received a HEP handout and was educated in correct form and appropriate frequency.  PT instructed pt she should not have pain with exercise.   Pt received R shoulder PROM in flexion and ER in supine per pt and tissue tolerance w/n protocol ranges.  PT assessed PROM.      PATIENT EDUCATION: Education details:  post op and protocol limitations and restrictions, dx,  HEP, POC, relevant anatomy, ice usage, and exercise form.  PT answered pt's questions. Person  educated: Patient Education method: Explanation, Demonstration, Tactile cues, Verbal cues, and Handouts Education comprehension: verbalized understanding, returned demonstration, verbal cues required, tactile cues required, and needs further education  HOME EXERCISE PROGRAM: Access Code: [email or web address] URL: [email or web address] Date: 06/16/2023 Prepared by: Marnie Siren   ASSESSMENT:  CLINICAL IMPRESSION: Patient is progressing with R shoulder PROM and AAROM.  She tolerated PROM well.  Pt performed exercises per protocol well with cuing and instruction in correct form.  Pt reports increased pain to 3/10 after Rx and states she will use at home.  Patient will continue to benefit from continued skilled PT per protocol to address ongoing goals and impairment and to improve overall function.    OBJECTIVE IMPAIRMENTS: decreased ROM, decreased strength, hypomobility, impaired flexibility, impaired UE functional use, and pain.   ACTIVITY LIMITATIONS: carrying, lifting, bathing, dressing, reach over head, and hygiene/grooming  PARTICIPATION LIMITATIONS: meal prep, cleaning, laundry, driving, shopping, community activity, and occupation  PERSONAL FACTORS: 1-2 comorbidities: DM type 2 arthritis are also affecting patient's functional outcome.   REHAB POTENTIAL: Good  CLINICAL DECISION MAKING: Stable/uncomplicated  EVALUATION COMPLEXITY: Low   GOALS:   SHORT TERM GOALS:   Pt will be independent with HEP for improved pain, ROM, and function.  Baseline: Goal status: INITIAL Target date:   07/12/2023  2.  Pt will wean out of sling per MD instruction without adverse effects.  Baseline:  Goal status: INITIAL Target date:   07/12/2023  3.  Pt will progress with PROM and AAROM per protocol without adverse effects for improved shoulder mobility and function. Baseline:  Goal status:  INITIAL Target date:   07/12/2023  4.  Pt will demo R shoulder PROM/AAROM to be at least 140/120 deg in flexion (supine) and 50/45 deg in ER for improved shoulder mobility and stiffness. Baseline:  Goal status: INITIAL Target date:   07/26/2023   5.  Pt will demo at least 100 deg of R shoulder elevation in standing without significant UT compensatory hike.   Baseline:  Goal status: INITIAL Target date:   08/09/2023  6.  Ptwill be able to perform her self care activities with no > than minimal difficulty.  Baseline:  Goal status: INITIAL Target date:   09/06/2023   LONG TERM GOALS: Target date:  10/04/2023   Pt will demo R shoulder AROM to be [facility] t/o for performance of ADLs and IADLs. Baseline:  Goal status: INITIAL  2.  Pt will be able to perform her ADLs and IADLs including household chores without significant difficulty and pain.  Baseline:  Goal status: INITIAL  3.  Pt will be able to perform her normal reaching and overhead activities without significant limitation and pain.  Baseline:  Goal status: INITIAL  4.  Pt will return to work with expected limitations without adverse effects.   Baseline:  Goal status: INITIAL  5.  Pt will demo R shoulder strength to be at least 4+/5 MMT in flexion, ER, and IR and 4/5 strength in abduction for performance of work activities and functional lifting/carrying.   Baseline:  Goal status: INITIAL   PLAN:  PT FREQUENCY: 1x/wk x 3-4 weeks and 2x/wk afterwards  PT DURATION: other: 16 weeks  PLANNED INTERVENTIONS: [id number]- PT Re-evaluation, 97750- Physical Performance Testing, 97110-Therapeutic exercises, 97530- Therapeutic activity, [id number]- Neuromuscular re-education, 97535- Self Care, 16109- Manual therapy, [id number]- Aquatic Therapy, [id number]- Electrical stimulation (unattended), [id number]- Electrical stimulation (manual), [id number]- Ultrasound, Patient/Family education, Taping, Dry Needling, Joint mobilization, Spinal mobilization, Scar mobilization,  Cryotherapy, and Moist heat  PLAN FOR NEXT SESSION: cont per biceps tenodesis protocol.  Review and perform HEP.     Trina Fujita III PT, DPT 07/13/23 11:20 PM

## 2023-07-12 NOTE — Progress Notes (Signed)
 Post Operative Evaluation    Procedure/Date of Surgery: Right shoulder arthroscopy with distal clavicle resection and subacromial decompression  Interval History:   Presents today 4 weeks status post the above procedure overall doing well.  She has been working with physical therapy.  Range of motion overhead is improving since her initial visit.  She is just having some soreness around the biceps tendon.   PMH/PSH/Family History/Social History/Meds/Allergies:    Past Medical History:  Diagnosis Date   Abnormal Pap smear of cervix    ascus hpv hr neg   Allergy    Arthritis    Chronic headaches    Depression    Diabetes mellitus without complication (HCC)    GERD (gastroesophageal reflux disease)    Hypertension    Low back pain    UTI (urinary tract infection)    Past Surgical History:  Procedure Laterality Date   CERVICAL BIOPSY  W/ LOOP ELECTRODE EXCISION     COLPOSCOPY     CIN2/3   SHOULDER ARTHROSCOPY WITH DISTAL CLAVICLE RESECTION Right 06/12/2023   Procedure: RIGHT SHOULDER ARTHROSCOPY WITH DISTAL CLAVICLE RESECTION, SUBPECTORIAL TENODESIS;  Surgeon: Wilhelmenia Harada, MD;  Location: [facility];  Service: Orthopedics;  Laterality: Right;   SUBACROMIAL DECOMPRESSION Right 06/12/2023   Procedure: RIGHT SUBACROMIAL DECOMPRESSION;  Surgeon: Wilhelmenia Harada, MD;  Location: [facility];  Service: Orthopedics;  Laterality: Right;   TUBAL LIGATION     Social History   Socioeconomic History   Marital status: Married    Spouse name: Not on file   Number of children: Not on file   Years of education: Not on file   Highest education level: Not on file  Occupational History   Not on file  Tobacco Use   Smoking status: Former    Types: Cigars   Smokeless tobacco: Never  Vaping Use   Vaping status: Never Used  Substance and Sexual Activity   Alcohol use: Yes    Alcohol/week: 0.0 - 2.0 standard drinks of  alcohol    Comment: occ   Drug use: No   Sexual activity: Yes    Partners: Male    Birth control/protection: Surgical, Post-menopausal    Comment: tubal ligation  Other Topics Concern   Not on file  Social History Narrative   Married May 13th 2017. 2 children- son 13 (married) and daughter 8. Granddaughter august 2017.       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      Hobbies: time with family   Social Drivers of Corporate investment banker Strain: Not on file  Food Insecurity: Not on file  Transportation Needs: Not on file  Physical Activity: Not on file  Stress: Not on file  Social Connections: Unknown (08/03/2021)   Received from [facility]   Social Network    Social Network: Not on file   Family History  Problem Relation Age of Onset   Colon polyps Mother    Diabetes Mother    Hypertension Mother    Hyperlipidemia Mother    Kidney disease Mother        has fistula- could need dialysis at any time   Colon polyps Father    Diabetes Father        mother  Hyperlipidemia Father        mother   Hypertension Father        mother   Colon cancer Father        54s, and grandmother   Lung cancer Father        in 64s   Deep vein thrombosis Father        not during dtime of cancer. also paternal aunts reported   Cancer - Other Sister        duodenal   Stomach cancer Sister    Colon cancer Paternal Grandmother    Colon cancer Maternal Aunt    Esophageal cancer Neg Hx    Liver cancer Neg Hx    Pancreatic cancer Neg Hx    Rectal cancer Neg Hx    Allergies  Allergen Reactions   Amoxicillin Swelling    Lip swelling   Nsaids     bleeding   Current Outpatient Medications  Medication Sig Dispense Refill   amLODipine  (NORVASC ) 2.5 MG tablet TAKE 1 TABLET BY MOUTH EVERY DAY 90 tablet 3   aspirin  EC 325 MG tablet Take 1 tablet (325 mg total) by mouth daily. 14 tablet 0   azelastine  (ASTELIN ) 0.1 % nasal spray  Place 2 sprays into both nostrils 2 (two) times daily. 90 mL 3   clotrimazole -betamethasone  (LOTRISONE ) cream APPLY TO AFFECTED AREA TWICE A DAY 45 g 1   Continuous Blood Gluc Sensor (FREESTYLE LIBRE 3 SENSOR) MISC Quantity: 2 sensors/month NDC# [phone] Sensor Refills: PRN or 12 refills annually 2 each 12   diclofenac  Sodium (VOLTAREN ) 1 % GEL Apply 4 g topically 4 (four) times daily. To affected joint. 100 g 11   fluticasone  (FLONASE ) 50 MCG/ACT nasal spray SPRAY 2 SPRAYS INTO EACH NOSTRIL EVERY DAY 48 mL 3   gabapentin  (NEURONTIN ) 300 MG capsule TAKE 2 CAPSULES BY MOUTH AT BEDTIME 180 capsule 1   hydrochlorothiazide  (HYDRODIURIL ) 25 MG tablet TAKE 1 TABLET (25 MG TOTAL) BY MOUTH DAILY. 90 tablet 3   ipratropium (ATROVENT ) 0.03 % nasal spray PLACE 2 SPRAYS INTO BOTH NOSTRILS EVERY 12 (TWELVE) HOURS. 90 mL 2   meloxicam  (MOBIC ) 7.5 MG tablet TAKE 1 TABLET (7.5 MG TOTAL) BY MOUTH DAILY AS NEEDED. FOR PAIN 90 tablet 1   oxyCODONE  (ROXICODONE ) 5 MG immediate release tablet Take 1 tablet (5 mg total) by mouth every 4 (four) hours as needed for severe pain (pain score 7-10) or breakthrough pain. (Patient not taking: Reported on 05/18/2023) 15 tablet 0   pantoprazole  (PROTONIX ) 40 MG tablet TAKE 1 TABLET BY MOUTH EVERY DAY 90 tablet 3   rosuvastatin  (CRESTOR ) 10 MG tablet TAKE 1 TABLET (10 MG TOTAL) BY MOUTH ONCE A WEEK. 13 tablet 3   Semaglutide ,0.25 or 0.5MG /DOS, (OZEMPIC , 0.25 OR 0.5 MG/DOSE,) 2 MG/3ML SOPN INJECT 0.25 MG AS DIRECTED ONCE A WEEK FOR 28 DAYS, THEN 0.5 MG ONCE A WEEK FOR 28 DAYS 3 mL 5   tiZANidine  (ZANAFLEX ) 4 MG tablet TAKE 0.5-1 TABLET (2-4 MG TOTAL) BY MOUTH EVERY 8 (EIGHT) HOURS AS NEEDED FOR MUSCLE SPASMS. 30 tablet 2   No current facility-administered medications for this visit.   No results found.  Review of Systems:   A ROS was performed including pertinent positives and negatives as documented in the HPI.   Musculoskeletal Exam:    Right shoulder incisions are  well-appearing without erythema or drainage.  There is a small rash about the anterior aspect of the elbow.  In the spine position she  can forward elevate to 90 degrees actively with external rotation at side to 30.  Distal neurosensory exam is intact  Imaging:      I personally reviewed and interpreted the radiographs.   Assessment:   4 Weeks status post right shoulder distal clavicle resection subacromial decompression overall doing well.  At this time we will plan to return her to work on light duty.  She is continuing to improve.  I will plan to see her back in 4 weeks for reassessment.  Plan :    - Return to clinic 4 weeks for assessment of the right shoulder as well as left hip      I personally saw and evaluated the patient, and participated in the management and treatment plan.  Wilhelmenia Harada, MD Attending Physician, Orthopedic Surgery  This document was dictated using Dragon voice recognition software. A reasonable attempt at proof reading has been made to minimize errors.

## 2023-07-14 ENCOUNTER — Ambulatory Visit (HOSPITAL_BASED_OUTPATIENT_CLINIC_OR_DEPARTMENT_OTHER): Payer: Self-pay | Admitting: Physical Therapy

## 2023-07-14 DIAGNOSIS — M6281 Muscle weakness (generalized): Secondary | ICD-10-CM

## 2023-07-14 DIAGNOSIS — M25511 Pain in right shoulder: Secondary | ICD-10-CM

## 2023-07-14 DIAGNOSIS — M25611 Stiffness of right shoulder, not elsewhere classified: Secondary | ICD-10-CM

## 2023-07-14 NOTE — Therapy (Signed)
 OUTPATIENT PHYSICAL THERAPY SHOULDER TREATMENT   Patient Name: Tonya Suarez MRN: [id number] DOB:March 31, 1970, 53 y.o., female Today's Date: 07/14/2023  END OF SESSION:  PT End of Session - 07/14/23 1602     Visit Number 5    Number of Visits 29    Date for PT Re-Evaluation 09/06/23    Authorization Type U[employer]    PT Start Time 1540    PT Stop Time 1620    PT Time Calculation (min) 40 min    Activity Tolerance Patient tolerated treatment well    Behavior During Therapy WFL for tasks assessed/performed               Past Medical History:  Diagnosis Date   Abnormal Pap smear of cervix    ascus hpv hr neg   Allergy    Arthritis    Chronic headaches    Depression    Diabetes mellitus without complication (HCC)    GERD (gastroesophageal reflux disease)    Hypertension    Low back pain    UTI (urinary tract infection)    Past Surgical History:  Procedure Laterality Date   CERVICAL BIOPSY  W/ LOOP ELECTRODE EXCISION     COLPOSCOPY     CIN2/3   SHOULDER ARTHROSCOPY WITH DISTAL CLAVICLE RESECTION Right 06/12/2023   Procedure: RIGHT SHOULDER ARTHROSCOPY WITH DISTAL CLAVICLE RESECTION, SUBPECTORIAL TENODESIS;  Surgeon: Wilhelmenia Harada, MD;  Location: [facility];  Service: Orthopedics;  Laterality: Right;   SUBACROMIAL DECOMPRESSION Right 06/12/2023   Procedure: RIGHT SUBACROMIAL DECOMPRESSION;  Surgeon: Wilhelmenia Harada, MD;  Location: [facility];  Service: Orthopedics;  Laterality: Right;   TUBAL LIGATION     Patient Active Problem List   Diagnosis Date Noted   Impingement syndrome of right shoulder 06/12/2023   Arthritis of right acromioclavicular joint 06/12/2023   Biceps tendinitis of right upper extremity 06/12/2023   Hyperlipidemia associated with type 2 diabetes mellitus (HCC) 05/25/2021   Controlled diabetes mellitus (HCC) 05/13/2021   Recurrent major depressive disorder, in full remission (HCC) 05/11/2021   Bilateral sciatica  12/08/2020   Mass of thigh, left 08/26/2020   Pain in right hand 09/27/2018   Former smoker 07/19/2018   Constipation 07/19/2018   B12 deficiency 07/19/2018   Restless legs 07/19/2018   Ingrown toenail 12/20/2016   History of adenomatous polyp of colon 09/23/2016   Migraines 05/06/2016   Family history of cancer of GI tract 12/06/2012   HTN (hypertension) 11/21/2011   MENORRHAGIA 09/23/2009   PLANTAR FASCIITIS 09/20/2007   Osteoarthritis 09/19/2006   Depression 09/06/2006   Allergic rhinitis 09/06/2006   GERD 09/06/2006     REFERRING PROVIDER: Wilhelmenia Harada, MD  REFERRING DIAG: M75.41 (ICD-10-CM) - Impingement syndrome of right shoulder  S/p Arthroscopic limited debridement  Arthroscopic distal clavicle excision  Arthroscopic subacromial decompression   Arthroscopic biceps tenodesis   THERAPY DIAG:  Right shoulder pain, unspecified chronicity  Stiffness of right shoulder, not elsewhere classified  Muscle weakness (generalized)  Rationale for Evaluation and Treatment: Rehabilitation  ONSET DATE: DOS  06/12/2023  SUBJECTIVE:  SUBJECTIVE STATEMENT: Patient reports it si a little sore today but overall it is imprpving.    Hand dominance: Right  PERTINENT HISTORY: R shoulder arthroscopic biceps tenodesis, subacromial decompression, DCE, and limited debridement on 06/12/23  DM type 2, arthritis, LBP, HTN   PAIN:  Are you having pain? Yes NPRS:  0/10 currently but can reach 8/10 ; 9/10 worst Location:  R shoulder, anterior and anterolaterl  PRECAUTIONS: Other: per surgical protocol    WEIGHT BEARING RESTRICTIONS: Yes R UE  FALLS:  Has patient fallen in last 6 months? No  LIVING ENVIRONMENT: Lives with: lives with their family Lives in: 1 story home   OCCUPATION: Night shift  supervisor at [employer]  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    PLOF: Independent  PATIENT GOALS: to be able sleep well without shoulder pain.  To perform activities without pain  NEXT MD VISIT:  06/28/23  OBJECTIVE:  Note: Objective measures were completed at Evaluation unless otherwise noted.  DIAGNOSTIC FINDINGS:  Pt is post op.  Pt had a MRI before surgery.                                                                                                                               TREATMENT:  5/23  Manual:  Trigger point release to upper trap/ anterior shoulder  Pec release with active abduction  Grade II and III inferior and posterior glides  PROM into flexion and Er with distraction  There-ex:  Wand press x10 1 lb low RPE  Wand press 2x10 2lbs 4 Active ER 3x10   Neuro-re-ed Bent over row 2x10    5/21 Reviewed pt presentation, HEP compliance, pain level, and response to prior Rx.   Pt received R shoulder PROM in flexion, scaption, ER, and IR in supine per pt and tissue tolerance w/n protocol ranges  Supine wand flexion 2x10 Supine wand ER 2x10  Supine active flexion x 10 reps   06/28/23 PROM flexion, ER  Supine well arm assisted flexion 1 x 10 Supine AAROM ER with cane 1 x 10 Supine AAROM flexion with cane 1 x 10 Seated scap retractions 2 x 10   06/21/23 PT removed dressings and applied new gauze and tegaderm over portals.    Pendulums 3x10 cw and ccw wrist flex/ext AROM in sling x20 reps hand pumps in sling x 20 reps.   Pt received a HEP handout and was educated in correct form and appropriate frequency.  PT instructed pt she should not have pain with exercise.   Pt received R shoulder PROM in flexion and ER in supine per pt and tissue tolerance w/n protocol ranges.  PT assessed PROM.      PATIENT EDUCATION: Education details:  post op and protocol limitations and restrictions, dx, HEP, POC, relevant anatomy, ice usage, and  exercise form.  PT answered pt's questions. Person educated: Patient Education method: Explanation, Demonstration, Tactile cues,  Verbal cues, and Handouts Education comprehension: verbalized understanding, returned demonstration, verbal cues required, tactile cues required, and needs further education  HOME EXERCISE PROGRAM: Access Code: [email or web address] URL: [email or web address] Date: 06/16/2023 Prepared by: Marnie Siren   ASSESSMENT:  CLINICAL IMPRESSION: The patient tolerated treatment well. Her ER improved significantly with manual therapy to her shoulder. We progressed her to light active ER without an increase in pain.   OBJECTIVE IMPAIRMENTS: decreased ROM, decreased strength, hypomobility, impaired flexibility, impaired UE functional use, and pain.   ACTIVITY LIMITATIONS: carrying, lifting, bathing, dressing, reach over head, and hygiene/grooming  PARTICIPATION LIMITATIONS: meal prep, cleaning, laundry, driving, shopping, community activity, and occupation  PERSONAL FACTORS: 1-2 comorbidities: DM type 2 arthritis are also affecting patient's functional outcome.   REHAB POTENTIAL: Good  CLINICAL DECISION MAKING: Stable/uncomplicated  EVALUATION COMPLEXITY: Low   GOALS:   SHORT TERM GOALS:   Pt will be independent with HEP for improved pain, ROM, and function.  Baseline: Goal status: INITIAL Target date:   07/12/2023  2.  Pt will wean out of sling per MD instruction without adverse effects.  Baseline:  Goal status: INITIAL Target date:   07/12/2023  3.  Pt will progress with PROM and AAROM per protocol without adverse effects for improved shoulder mobility and function. Baseline:  Goal status: INITIAL Target date:   07/12/2023  4.  Pt will demo R shoulder PROM/AAROM to be at least 140/120 deg in flexion (supine) and 50/45 deg in ER for improved shoulder mobility and stiffness. Baseline:  Goal status: INITIAL Target date:   07/26/2023   5.  Pt will  demo at least 100 deg of R shoulder elevation in standing without significant UT compensatory hike.   Baseline:  Goal status: INITIAL Target date:   08/09/2023  6.  Ptwill be able to perform her self care activities with no > than minimal difficulty.  Baseline:  Goal status: INITIAL Target date:   09/06/2023   LONG TERM GOALS: Target date:  10/04/2023   Pt will demo R shoulder AROM to be [facility] t/o for performance of ADLs and IADLs. Baseline:  Goal status: INITIAL  2.  Pt will be able to perform her ADLs and IADLs including household chores without significant difficulty and pain.  Baseline:  Goal status: INITIAL  3.  Pt will be able to perform her normal reaching and overhead activities without significant limitation and pain.  Baseline:  Goal status: INITIAL  4.  Pt will return to work with expected limitations without adverse effects.   Baseline:  Goal status: INITIAL  5.  Pt will demo R shoulder strength to be at least 4+/5 MMT in flexion, ER, and IR and 4/5 strength in abduction for performance of work activities and functional lifting/carrying.   Baseline:  Goal status: INITIAL   PLAN:  PT FREQUENCY: 1x/wk x 3-4 weeks and 2x/wk afterwards  PT DURATION: other: 16 weeks  PLANNED INTERVENTIONS: 97164- PT Re-evaluation, 97750- Physical Performance Testing, 97110-Therapeutic exercises, 97530- Therapeutic activity, [id number]- Neuromuscular re-education, 97535- Self Care, 16109- Manual therapy, [id number]- Aquatic Therapy, U0454- Electrical stimulation (unattended), [id number]- Electrical stimulation (manual), 97035- Ultrasound, Patient/Family education, Taping, Dry Needling, Joint mobilization, Spinal mobilization, Scar mobilization, Cryotherapy, and Moist heat  PLAN FOR NEXT SESSION: cont per biceps tenodesis protocol.  Review and perform HEP.     Signa Drier PT DPT 07/14/23 4:03 PM

## 2023-07-18 ENCOUNTER — Encounter (HOSPITAL_BASED_OUTPATIENT_CLINIC_OR_DEPARTMENT_OTHER): Payer: Self-pay | Admitting: Physical Therapy

## 2023-07-19 ENCOUNTER — Ambulatory Visit (HOSPITAL_BASED_OUTPATIENT_CLINIC_OR_DEPARTMENT_OTHER): Payer: Self-pay | Admitting: Physical Therapy

## 2023-07-19 DIAGNOSIS — M25511 Pain in right shoulder: Secondary | ICD-10-CM | POA: Diagnosis not present

## 2023-07-19 DIAGNOSIS — M25611 Stiffness of right shoulder, not elsewhere classified: Secondary | ICD-10-CM

## 2023-07-19 DIAGNOSIS — M6281 Muscle weakness (generalized): Secondary | ICD-10-CM

## 2023-07-19 NOTE — Therapy (Signed)
 OUTPATIENT PHYSICAL THERAPY SHOULDER TREATMENT   Patient Name: Tonya Suarez MRN: [id number] DOB:[birth year], 53 y.o., female Today's Date: 07/20/2023  END OF SESSION:  PT End of Session - 07/19/23 1537     Visit Number 6    Number of Visits 29    Date for PT Re-Evaluation 09/06/23    Authorization Type UHC    PT Start Time 1536    PT Stop Time 1621    PT Time Calculation (min) 45 min    Activity Tolerance Patient tolerated treatment well    Behavior During Therapy WFL for tasks assessed/performed               Past Medical History:  Diagnosis Date   Abnormal Pap smear of cervix    ascus hpv hr neg   Allergy    Arthritis    Chronic headaches    Depression    Diabetes mellitus without complication (HCC)    GERD (gastroesophageal reflux disease)    Hypertension    Low back pain    UTI (urinary tract infection)    Past Surgical History:  Procedure Laterality Date   CERVICAL BIOPSY  W/ LOOP ELECTRODE EXCISION     COLPOSCOPY     CIN2/3   SHOULDER ARTHROSCOPY WITH DISTAL CLAVICLE RESECTION Right 06/12/2023   Procedure: RIGHT SHOULDER ARTHROSCOPY WITH DISTAL CLAVICLE RESECTION, SUBPECTORIAL TENODESIS;  Surgeon: Wilhelmenia Harada, MD;  Location: [facility];  Service: Orthopedics;  Laterality: Right;   SUBACROMIAL DECOMPRESSION Right 06/12/2023   Procedure: RIGHT SUBACROMIAL DECOMPRESSION;  Surgeon: Wilhelmenia Harada, MD;  Location: [facility];  Service: Orthopedics;  Laterality: Right;   TUBAL LIGATION     Patient Active Problem List   Diagnosis Date Noted   Impingement syndrome of right shoulder 06/12/2023   Arthritis of right acromioclavicular joint 06/12/2023   Biceps tendinitis of right upper extremity 06/12/2023   Hyperlipidemia associated with type 2 diabetes mellitus (HCC) 05/25/2021   Controlled diabetes mellitus (HCC) 05/13/2021   Recurrent major depressive disorder, in full remission (HCC) 05/11/2021   Bilateral sciatica  12/08/2020   Mass of thigh, left 08/26/2020   Pain in right hand 09/27/2018   Former smoker 07/19/2018   Constipation 07/19/2018   B12 deficiency 07/19/2018   Restless legs 07/19/2018   Ingrown toenail 12/20/2016   History of adenomatous polyp of colon 09/23/2016   Migraines 05/06/2016   Family history of cancer of GI tract 12/06/2012   HTN (hypertension) 11/21/2011   MENORRHAGIA 09/23/2009   PLANTAR FASCIITIS 09/20/2007   Osteoarthritis 09/19/2006   Depression 09/06/2006   Allergic rhinitis 09/06/2006   GERD 09/06/2006     REFERRING PROVIDER: Wilhelmenia Harada, MD  REFERRING DIAG: M75.41 (ICD-10-CM) - Impingement syndrome of right shoulder  S/p Arthroscopic limited debridement  Arthroscopic distal clavicle excision  Arthroscopic subacromial decompression   Arthroscopic biceps tenodesis   THERAPY DIAG:  Right shoulder pain, unspecified chronicity  Stiffness of right shoulder, not elsewhere classified  Muscle weakness (generalized)  Rationale for Evaluation and Treatment: Rehabilitation  ONSET DATE: DOS  06/12/2023  SUBJECTIVE:  SUBJECTIVE STATEMENT: Pt is 5 weeks and 2 days post op.  Last night was her first night working.  She is on light duty.  Pt reports she started having pain about 6 hours into work.  Pt states her shoulder felt pretty good after prior Rx.    Hand dominance: Right  PERTINENT HISTORY: R shoulder arthroscopic biceps tenodesis, subacromial decompression, DCE, and limited debridement on 06/12/23  DM type 2, arthritis, LBP, HTN   PAIN:  Are you having pain? Yes NPRS:  4/10 currently but can reach 8/10 ; 9/10 worst Location:  R shoulder, anterior and latera  PRECAUTIONS: Other: per surgical protocol    WEIGHT BEARING RESTRICTIONS: Yes R UE  FALLS:  Has patient  fallen in last 6 months? 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    PLOF: Independent  PATIENT GOALS: to be able sleep well without shoulder pain.  To perform activities without pain  NEXT MD VISIT:  06/28/23  OBJECTIVE:  Note: Objective measures were completed at Evaluation unless otherwise noted.  DIAGNOSTIC FINDINGS:  Pt is post op.  Pt had a MRI before surgery.                                                                                                                               TREATMENT:  5/28 Manual Therapy: STM to R UT seated Grade II and III posterior and inf glides to R GH joint   Pt received R shoulder PROM in flexion, scaption, ER, and IR in supine per pt and tissue tolerance w/n protocol ranges Supine wand flexion x 10 reps Supine flexion AROM 2x10 reps Supine wand ER x 10 reps Supine ER AROM x10  Supine serratus punch x 10 reps  R shoulder ROM: Flexion AROM:  119-121 (supine) Flexion AAROM: 125 (supine)  Updated HEP.  Pt received a HEP handout and was educated in correct form and appropriate frequency.  PT instructed pt to not perform into a painful range.    5/23  Manual:  Trigger point release to upper trap/ anterior shoulder  Pec release with active abduction  Grade II and III inferior and posterior glides  PROM into flexion and Er with distraction  There-ex:  Wand press x10 1 lb low RPE  Wand press 2x10 2lbs 4 Active ER 3x10   Neuro-re-ed Bent over row 2x10    5/21 Reviewed pt presentation, HEP compliance, pain level, and response to prior Rx.   Pt received R shoulder PROM in flexion, scaption, ER, and IR in supine per pt and tissue tolerance w/n protocol ranges  Supine wand flexion 2x10 Supine wand ER 2x10  Supine active flexion x 10 reps   06/28/23 PROM flexion, ER   Supine well arm assisted flexion 1 x 10 Supine AAROM  ER with cane 1 x 10 Supine AAROM flexion with cane 1 x 10 Seated scap retractions 2 x 10   PATIENT EDUCATION: Education details:  post op and protocol limitations and restrictions, dx, HEP, POC, relevant anatomy, ice usage, and exercise form.  PT answered pt's questions. Person educated: Patient Education method: Explanation, Demonstration, Tactile cues, Verbal cues, and Handouts Education comprehension: verbalized understanding, returned demonstration, verbal cues required, tactile cues required, and needs further education  HOME EXERCISE PROGRAM: Access Code: [email or web address] URL: [email or web address] Date: 06/16/2023 Prepared by: Marnie Siren  Updated HEP: - Supine Shoulder Flexion AROM  - 2 x daily - 7 x weekly - 1 sets - 10 reps  ASSESSMENT:  CLINICAL IMPRESSION: Pt has returned to work on light duty.  She had increased pain about 6 hours into her shift.  She has soft tissue tightness in UT and PT performed STM to UT.  Pt has tightness in shoulder ROM which improved with increased reps of PROM.  Pt is progressing with protocol and is improving with shoulder ROM.  She performed AAROM/AROM exercises per protocol well with cuing for correct form.  She tolerated manual therapy and ther ex well.  Pt states her shoulder hurts a little worse but not bad after Rx.  Pt will cont to benefit from cont skilled PT per protocol to improve ROM and stiffness, address ongoing goals, progress protocol, and to improve function.   OBJECTIVE IMPAIRMENTS: decreased ROM, decreased strength, hypomobility, impaired flexibility, impaired UE functional use, and pain.   ACTIVITY LIMITATIONS: carrying, lifting, bathing, dressing, reach over head, and hygiene/grooming  PARTICIPATION LIMITATIONS: meal prep, cleaning, laundry, driving, shopping, community activity, and occupation  PERSONAL FACTORS: 1-2 comorbidities: DM type 2 arthritis are also  affecting patient's functional outcome.   REHAB POTENTIAL: Good  CLINICAL DECISION MAKING: Stable/uncomplicated  EVALUATION COMPLEXITY: Low   GOALS:   SHORT TERM GOALS:   Pt will be independent with HEP for improved pain, ROM, and function.  Baseline: Goal status: INITIAL Target date:   07/12/2023  2.  Pt will wean out of sling per MD instruction without adverse effects.  Baseline:  Goal status: GOAL MET Target date:   07/12/2023  3.  Pt will progress with PROM and AAROM per protocol without adverse effects for improved shoulder mobility and function. Baseline:  Goal status: INITIAL Target date:   07/12/2023  4.  Pt will demo R shoulder PROM/AAROM to be at least 140/120 deg in flexion (supine) and 50/45 deg in ER for improved shoulder mobility and stiffness. Baseline:  Goal status: INITIAL Target date:   07/26/2023   5.  Pt will demo at least 100 deg of R shoulder elevation in standing without significant UT compensatory hike.   Baseline:  Goal status: INITIAL Target date:   08/09/2023  6.  Ptwill be able to perform her self care activities with no > than minimal difficulty.  Baseline:  Goal status: INITIAL Target date:   09/06/2023   LONG TERM GOALS: Target date:  10/04/2023   Pt will demo R shoulder AROM to be [facility] t/o for performance of ADLs and IADLs. Baseline:  Goal status: INITIAL  2.  Pt will be able to perform her ADLs and IADLs including household chores without significant difficulty and pain.  Baseline:  Goal status: INITIAL  3.  Pt will be able to perform her normal reaching and overhead activities without significant limitation and pain.  Baseline:  Goal status: INITIAL  4.  Pt will return to work  with expected limitations without adverse effects.   Baseline:  Goal status: INITIAL  5.  Pt will demo R shoulder strength to be at least 4+/5 MMT in flexion, ER, and IR and 4/5 strength in abduction for performance of work activities and functional  lifting/carrying.   Baseline:  Goal status: INITIAL   PLAN:  PT FREQUENCY: 1x/wk x 3-4 weeks and 2x/wk afterwards  PT DURATION: other: 16 weeks  PLANNED INTERVENTIONS: 97164- PT Re-evaluation, 97750- Physical Performance Testing, 97110-Therapeutic exercises, 97530- Therapeutic activity, [id number]- Neuromuscular re-education, 97535- Self Care, 16109- Manual therapy, [id number]- Aquatic Therapy, U0454- Electrical stimulation (unattended), [id number]- Electrical stimulation (manual), 97035- Ultrasound, Patient/Family education, Taping, Dry Needling, Joint mobilization, Spinal mobilization, Scar mobilization, Cryotherapy, and Moist heat  PLAN FOR NEXT SESSION: cont per biceps tenodesis protocol.  Review and perform HEP.     Trina Fujita III PT, DPT 07/20/23 9:00 PM

## 2023-07-20 ENCOUNTER — Other Ambulatory Visit: Payer: Self-pay | Admitting: Family Medicine

## 2023-07-20 ENCOUNTER — Encounter (HOSPITAL_BASED_OUTPATIENT_CLINIC_OR_DEPARTMENT_OTHER): Payer: Self-pay | Admitting: Physical Therapy

## 2023-07-20 DIAGNOSIS — Z1231 Encounter for screening mammogram for malignant neoplasm of breast: Secondary | ICD-10-CM

## 2023-07-21 ENCOUNTER — Telehealth (HOSPITAL_BASED_OUTPATIENT_CLINIC_OR_DEPARTMENT_OTHER): Payer: Self-pay | Admitting: Physical Therapy

## 2023-07-21 ENCOUNTER — Ambulatory Visit (HOSPITAL_BASED_OUTPATIENT_CLINIC_OR_DEPARTMENT_OTHER): Payer: Self-pay | Admitting: Physical Therapy

## 2023-07-21 NOTE — Telephone Encounter (Signed)
 Patient no show, spoke with patient who mixed up her appointment times. Reminded her of next session.   2:50 PM, 07/21/23 [name] PT, DPT Physical Therapist at [facility]

## 2023-07-24 ENCOUNTER — Encounter: Payer: Self-pay | Admitting: Podiatry

## 2023-07-24 ENCOUNTER — Ambulatory Visit (INDEPENDENT_AMBULATORY_CARE_PROVIDER_SITE_OTHER): Admitting: Podiatry

## 2023-07-24 VITALS — Ht 70.0 in | Wt 179.0 lb

## 2023-07-24 DIAGNOSIS — L600 Ingrowing nail: Secondary | ICD-10-CM

## 2023-07-24 NOTE — Progress Notes (Signed)
 Subjective:   Patient ID: Tonya Suarez, female   DOB: 53 y.o.   MRN: [id number]   HPI Patient presents with very painful second nail right stated that she worked on this herself and it has been inflamed and sore   ROS      Objective:  Physical Exam  Neuro vascular status intact with inflammation pain of the second nailbed right that is irritated and ingrown     Assessment:  Ingrown toenail deformity right second nailbed with pain and damage nailbed     Plan:  H&P reviewed recommended correction and recommended nail removal.  Explained procedure risk patient wants surgery understanding risk and today I had her sign consent form after review.  I then infiltrated the right second toe 6D milligram Xylocaine  Marcaine  mixture sterile prep done using sterile instrumentation removed the second nail exposed matrix applied phenol 3 applications 30 seconds followed by alcohol lavage sterile dressing and instructions on soaks and wear dressing 24 hours taken off earlier if throbbing were to occur and call us  with questions concerns which may come up during healing

## 2023-07-24 NOTE — Patient Instructions (Signed)

## 2023-07-26 ENCOUNTER — Ambulatory Visit (HOSPITAL_BASED_OUTPATIENT_CLINIC_OR_DEPARTMENT_OTHER): Attending: Orthopaedic Surgery | Admitting: Physical Therapy

## 2023-07-26 ENCOUNTER — Encounter (HOSPITAL_BASED_OUTPATIENT_CLINIC_OR_DEPARTMENT_OTHER): Payer: Self-pay | Admitting: Physical Therapy

## 2023-07-26 DIAGNOSIS — M25511 Pain in right shoulder: Secondary | ICD-10-CM | POA: Insufficient documentation

## 2023-07-26 DIAGNOSIS — M25611 Stiffness of right shoulder, not elsewhere classified: Secondary | ICD-10-CM | POA: Insufficient documentation

## 2023-07-26 DIAGNOSIS — M6281 Muscle weakness (generalized): Secondary | ICD-10-CM | POA: Diagnosis present

## 2023-07-26 NOTE — Therapy (Incomplete)
 OUTPATIENT PHYSICAL THERAPY SHOULDER TREATMENT   Patient Name: Tonya Suarez MRN: [id number] DOB:[birth year], 53 y.o., female Today's Date: 07/27/2023  END OF SESSION:  PT End of Session - 07/26/2023    Visit Number 7   Number of Visits 29   Date for PT Re-Evaluation 09/06/2023   Authorization Type U[employer]   PT Start Time  1158   PT Stop Time 69629   PT Time Calculation (min) 43 min   Activity Tolerance Patient tolerated treatment well   Behavior During Therapy  FWL for tasks assessed/performed       Past Medical History:  Diagnosis Date   Abnormal Pap smear of cervix    ascus hpv hr neg   Allergy    Arthritis    Chronic headaches    Depression    Diabetes mellitus without complication (HCC)    GERD (gastroesophageal reflux disease)    Hypertension    Low back pain    UTI (urinary tract infection)    Past Surgical History:  Procedure Laterality Date   CERVICAL BIOPSY  W/ LOOP ELECTRODE EXCISION     COLPOSCOPY     CIN2/3   SHOULDER ARTHROSCOPY WITH DISTAL CLAVICLE RESECTION Right 06/12/2023   Procedure: RIGHT SHOULDER ARTHROSCOPY WITH DISTAL CLAVICLE RESECTION, SUBPECTORIAL TENODESIS;  Surgeon: Wilhelmenia Harada, MD;  Location: [facility];  Service: Orthopedics;  Laterality: Right;   SUBACROMIAL DECOMPRESSION Right 06/12/2023   Procedure: RIGHT SUBACROMIAL DECOMPRESSION;  Surgeon: Wilhelmenia Harada, MD;  Location: [facility];  Service: Orthopedics;  Laterality: Right;   TUBAL LIGATION     Patient Active Problem List   Diagnosis Date Noted   Impingement syndrome of right shoulder 06/12/2023   Arthritis of right acromioclavicular joint 06/12/2023   Biceps tendinitis of right upper extremity 06/12/2023   Hyperlipidemia associated with type 2 diabetes mellitus (HCC) 05/25/2021   Controlled diabetes mellitus (HCC) 05/13/2021   Recurrent major depressive disorder, in full remission (HCC) 05/11/2021   Bilateral sciatica 12/08/2020   Mass of  thigh, left 08/26/2020   Pain in right hand 09/27/2018   Former smoker 07/19/2018   Constipation 07/19/2018   B12 deficiency 07/19/2018   Restless legs 07/19/2018   Ingrown toenail 12/20/2016   History of adenomatous polyp of colon 09/23/2016   Migraines 05/06/2016   Family history of cancer of GI tract 12/06/2012   HTN (hypertension) 11/21/2011   MENORRHAGIA 09/23/2009   PLANTAR FASCIITIS 09/20/2007   Osteoarthritis 09/19/2006   Depression 09/06/2006   Allergic rhinitis 09/06/2006   GERD 09/06/2006     REFERRING PROVIDER: Wilhelmenia Harada, MD  REFERRING DIAG: M75.41 (ICD-10-CM) - Impingement syndrome of right shoulder  S/p Arthroscopic limited debridement  Arthroscopic distal clavicle excision  Arthroscopic subacromial decompression   Arthroscopic biceps tenodesis   THERAPY DIAG:  Right shoulder pain, unspecified chronicity  Stiffness of right shoulder, not elsewhere classified  Muscle weakness (generalized)  Rationale for Evaluation and Treatment: Rehabilitation  ONSET DATE: DOS  06/12/2023  SUBJECTIVE:  SUBJECTIVE STATEMENT: Pt is 6 weeks and 2 days post op.  Pt has been working.  She reports having increased pain over the past week.  Pt has been using pain medication.  Pt has been having difficulty sleeping due to pain.  Pt states she has more range and is able to doff shirt.  Pt reports having increased pain later in the evening after prior Rx.    Pt had an ingrown toenail removed last week.     Hand dominance: Right  PERTINENT HISTORY: R shoulder arthroscopic biceps tenodesis, subacromial decompression, DCE, and limited debridement on 06/12/23  DM type 2, arthritis, LBP, HTN   PAIN:  Are you having pain? Yes NPRS:  8/10 currently  Location:  R shoulder, anterolateral, ant  portion of UT  PRECAUTIONS: Other: per surgical protocol    WEIGHT BEARING RESTRICTIONS: Yes R UE  FALLS:  Has patient fallen in last 6 months? 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    PLOF: Independent  PATIENT GOALS: to be able sleep well without shoulder pain.  To perform activities without pain  NEXT MD VISIT:  06/28/23  OBJECTIVE:  Note: Objective measures were completed at Evaluation unless otherwise noted.  DIAGNOSTIC FINDINGS:  Pt is post op.  Pt had a MRI before surgery.                                                                                                                               TREATMENT:  6/4 Pulleys in flexion and scaption 2x10 each Supine wand flexion x 10 reps Supine flexion AROM 2x10 reps Supine wand ER x 10 reps Supine ER AROM x10  Supine serratus punch 2 x 10 reps Supine shoulder ABC x 1 reps S/L shoulder Abduction with assistance from PT x 10 reps   Pt received R shoulder PROM in flexion, scaption, ER, and IR in supine per pt and tissue tolerance w/n protocol ranges  5/28 Manual Therapy: STM to R UT seated Grade II and III posterior and inf glides to R GH joint   Pt received R shoulder PROM in flexion, scaption, ER, and IR in supine per pt and tissue tolerance w/n protocol ranges Supine wand flexion x 10 reps Supine flexion AROM 2x10 reps Supine wand ER x 10 reps Supine ER AROM x10  Supine serratus punch x 10 reps  R shoulder ROM: Flexion AROM:  119-121 (supine) Flexion AAROM: 125 (supine)  Updated HEP.  Pt received a HEP handout and was educated in correct form and appropriate frequency.  PT instructed pt to not perform into a painful range.    5/23  Manual:  Trigger point release to upper trap/ anterior shoulder  Pec release with active abduction  Grade II  and III inferior and posterior glides  PROM into flexion and Er with distraction  There-ex:  Wand press x10 1 lb low RPE  Wand press 2x10 2lbs 4 Active ER 3x10   Neuro-re-ed Bent over row 2x10    5/21 Reviewed pt presentation, HEP compliance, pain level, and response to prior Rx.   Pt received R shoulder PROM in flexion, scaption, ER, and IR in supine per pt and tissue tolerance w/n protocol ranges  Supine wand flexion 2x10 Supine wand ER 2x10  Supine active flexion x 10 reps   PATIENT EDUCATION: Education details:  PT instructed pt to increase frequency of AAROM HEP.  dx, HEP, POC, relevant anatomy, ice usage, and exercise form.  PT answered pt's questions. Person educated: Patient Education method: Explanation, Demonstration, Tactile cues, Verbal cues, and Handouts Education comprehension: verbalized understanding, returned demonstration, verbal cues required, tactile cues required, and needs further education  HOME EXERCISE PROGRAM: Access Code: [email or web address] URL: [email or web address] Date: 06/16/2023 Prepared by: Marnie Siren    ASSESSMENT:  CLINICAL IMPRESSION: Pt has returned to work on light duty.  Pt reports having increased pain and presents to treatment today with a high pain level.  Pt has tightness and limitations with shoulder ROM which improved with increased reps of PROM.  PT performed exercises per protocol for improved ROM and shoulder mobility.  She performed exercises per protocol well with cuing and instruction in correct form and tolerated exercises well.  Pt states she has been doing her HEP once per day and sometimes twice.  PT instructed her to increase frequency with AAROM exercises including performing 2x/wk consistently to improve ROM and stiffness.  Pt responded well to Rx reporting decreased pain after Rx.  Pt will cont to benefit from cont skilled PT per protocol to improve ROM and stiffness, address ongoing goals, progress protocol, and  to improve function.     OBJECTIVE IMPAIRMENTS: decreased ROM, decreased strength, hypomobility, impaired flexibility, impaired UE functional use, and pain.   ACTIVITY LIMITATIONS: carrying, lifting, bathing, dressing, reach over head, and hygiene/grooming  PARTICIPATION LIMITATIONS: meal prep, cleaning, laundry, driving, shopping, community activity, and occupation  PERSONAL FACTORS: 1-2 comorbidities: DM type 2 arthritis are also affecting patient's functional outcome.   REHAB POTENTIAL: Good  CLINICAL DECISION MAKING: Stable/uncomplicated  EVALUATION COMPLEXITY: Low   GOALS:   SHORT TERM GOALS:   Pt will be independent with HEP for improved pain, ROM, and function.  Baseline: Goal status: INITIAL Target date:   07/12/2023  2.  Pt will wean out of sling per MD instruction without adverse effects.  Baseline:  Goal status: GOAL MET Target date:   07/12/2023  3.  Pt will progress with PROM and AAROM per protocol without adverse effects for improved shoulder mobility and function. Baseline:  Goal status: INITIAL Target date:   07/12/2023  4.  Pt will demo R shoulder PROM/AAROM to be at least 140/120 deg in flexion (supine) and 50/45 deg in ER for improved shoulder mobility and stiffness. Baseline:  Goal status: INITIAL Target date:   07/26/2023   5.  Pt will demo at least 100 deg of R shoulder elevation in standing without significant UT compensatory hike.   Baseline:  Goal status: INITIAL Target date:   08/09/2023  6.  Ptwill be able to perform her self care activities with no > than minimal difficulty.  Baseline:  Goal status: INITIAL Target date:   09/06/2023   LONG TERM GOALS: Target date:  10/04/2023   Pt will demo R shoulder AROM to be [facility] t/o  for performance of ADLs and IADLs. Baseline:  Goal status: INITIAL  2.  Pt will be able to perform her ADLs and IADLs including household chores without significant difficulty and pain.  Baseline:  Goal status:  INITIAL  3.  Pt will be able to perform her normal reaching and overhead activities without significant limitation and pain.  Baseline:  Goal status: INITIAL  4.  Pt will return to work with expected limitations without adverse effects.   Baseline:  Goal status: INITIAL  5.  Pt will demo R shoulder strength to be at least 4+/5 MMT in flexion, ER, and IR and 4/5 strength in abduction for performance of work activities and functional lifting/carrying.   Baseline:  Goal status: INITIAL   PLAN:  PT FREQUENCY: 1x/wk x 3-4 weeks and 2x/wk afterwards  PT DURATION: other: 16 weeks  PLANNED INTERVENTIONS: 97164- PT Re-evaluation, 97750- Physical Performance Testing, 97110-Therapeutic exercises, 97530- Therapeutic activity, [id number]- Neuromuscular re-education, 97535- Self Care, 16109- Manual therapy, [id number]- Aquatic Therapy, U0454- Electrical stimulation (unattended), [id number]- Electrical stimulation (manual), 97035- Ultrasound, Patient/Family education, Taping, Dry Needling, Joint mobilization, Spinal mobilization, Scar mobilization, Cryotherapy, and Moist heat  PLAN FOR NEXT SESSION: cont per biceps tenodesis protocol.  Review and perform HEP.     Trina Fujita III PT, DPT 07/27/23 9:58 PM

## 2023-08-02 ENCOUNTER — Encounter: Payer: Self-pay | Admitting: Podiatry

## 2023-08-02 ENCOUNTER — Ambulatory Visit: Admitting: Podiatry

## 2023-08-02 ENCOUNTER — Ambulatory Visit (INDEPENDENT_AMBULATORY_CARE_PROVIDER_SITE_OTHER): Admitting: Podiatry

## 2023-08-02 VITALS — Ht 70.0 in | Wt 179.0 lb

## 2023-08-02 DIAGNOSIS — L03031 Cellulitis of right toe: Secondary | ICD-10-CM

## 2023-08-02 MED ORDER — DOXYCYCLINE HYCLATE 100 MG PO TABS: 100.0000 mg | ORAL_TABLET | Freq: Two times a day (BID) | ORAL | 1 refills | Status: DC

## 2023-08-03 ENCOUNTER — Encounter (HOSPITAL_BASED_OUTPATIENT_CLINIC_OR_DEPARTMENT_OTHER): Payer: Self-pay | Admitting: Physical Therapy

## 2023-08-03 ENCOUNTER — Ambulatory Visit (HOSPITAL_BASED_OUTPATIENT_CLINIC_OR_DEPARTMENT_OTHER): Payer: Self-pay | Admitting: Physical Therapy

## 2023-08-03 DIAGNOSIS — M6281 Muscle weakness (generalized): Secondary | ICD-10-CM

## 2023-08-03 DIAGNOSIS — M25511 Pain in right shoulder: Secondary | ICD-10-CM | POA: Diagnosis not present

## 2023-08-03 DIAGNOSIS — M25611 Stiffness of right shoulder, not elsewhere classified: Secondary | ICD-10-CM

## 2023-08-03 NOTE — Progress Notes (Signed)
 Subjective:   Patient ID: Tonya Suarez, female   DOB: 53 y.o.   MRN: [id number]   HPI Patient is concerned about the appearance of the second nailbed right with mild redness about the area and states that she did bump it and may have done something to it   ROS      Objective:  Physical Exam  Neurovascular status intact good digital perfusion discoloration to the right second toe which may more be due to trauma than anything else but there is some redness around the nailbed slight drainage localized     Assessment:  Possibility paronychia infection right second nail bed versus trauma or combination of the 2 trauma certainly occurring     Plan:  H&P reviewed and at this point I am going to place on Z-Pak continue soaks continue bandage usage and it should heal uneventfully.  May take a little longer due to the trauma so patient will watch this and monitor it and see us  if any issues were to occur

## 2023-08-03 NOTE — Therapy (Signed)
 OUTPATIENT PHYSICAL THERAPY SHOULDER TREATMENT   Patient Name: Tonya Suarez MRN: [id number] DOB:[birth year], 53 y.o., female Today's Date: 08/03/2023  END OF SESSION:  PT End of Session - 08/03/23 0804     Visit Number 8    Number of Visits 29    Date for PT Re-Evaluation 09/06/23    Authorization Type UHC    PT Start Time 0804    PT Stop Time 0844    PT Time Calculation (min) 40 min    Activity Tolerance Patient tolerated treatment well    Behavior During Therapy WFL for tasks assessed/performed             Past Medical History:  Diagnosis Date   Abnormal Pap smear of cervix    ascus hpv hr neg   Allergy    Arthritis    Chronic headaches    Depression    Diabetes mellitus without complication (HCC)    GERD (gastroesophageal reflux disease)    Hypertension    Low back pain    UTI (urinary tract infection)    Past Surgical History:  Procedure Laterality Date   CERVICAL BIOPSY  W/ LOOP ELECTRODE EXCISION     COLPOSCOPY     CIN2/3   SHOULDER ARTHROSCOPY WITH DISTAL CLAVICLE RESECTION Right 06/12/2023   Procedure: RIGHT SHOULDER ARTHROSCOPY WITH DISTAL CLAVICLE RESECTION, SUBPECTORIAL TENODESIS;  Surgeon: Wilhelmenia Harada, MD;  Location: [facility];  Service: Orthopedics;  Laterality: Right;   SUBACROMIAL DECOMPRESSION Right 06/12/2023   Procedure: RIGHT SUBACROMIAL DECOMPRESSION;  Surgeon: Wilhelmenia Harada, MD;  Location: [facility];  Service: Orthopedics;  Laterality: Right;   TUBAL LIGATION     Patient Active Problem List   Diagnosis Date Noted   Impingement syndrome of right shoulder 06/12/2023   Arthritis of right acromioclavicular joint 06/12/2023   Biceps tendinitis of right upper extremity 06/12/2023   Hyperlipidemia associated with type 2 diabetes mellitus (HCC) 05/25/2021   Controlled diabetes mellitus (HCC) 05/13/2021   Recurrent major depressive disorder, in full remission (HCC) 05/11/2021   Bilateral sciatica  12/08/2020   Mass of thigh, left 08/26/2020   Pain in right hand 09/27/2018   Former smoker 07/19/2018   Constipation 07/19/2018   B12 deficiency 07/19/2018   Restless legs 07/19/2018   Ingrown toenail 12/20/2016   History of adenomatous polyp of colon 09/23/2016   Migraines 05/06/2016   Family history of cancer of GI tract 12/06/2012   HTN (hypertension) 11/21/2011   MENORRHAGIA 09/23/2009   PLANTAR FASCIITIS 09/20/2007   Osteoarthritis 09/19/2006   Depression 09/06/2006   Allergic rhinitis 09/06/2006   GERD 09/06/2006     REFERRING PROVIDER: Wilhelmenia Harada, MD  REFERRING DIAG: M75.41 (ICD-10-CM) - Impingement syndrome of right shoulder  S/p Arthroscopic limited debridement  Arthroscopic distal clavicle excision  Arthroscopic subacromial decompression   Arthroscopic biceps tenodesis   THERAPY DIAG:  Right shoulder pain, unspecified chronicity  Stiffness of right shoulder, not elsewhere classified  Muscle weakness (generalized)  Rationale for Evaluation and Treatment: Rehabilitation  ONSET DATE: DOS  06/12/2023  SUBJECTIVE:  SUBJECTIVE STATEMENT: Patient states still some shoulder pain in the front. HEP going well.       Hand dominance: Right  PERTINENT HISTORY: R shoulder arthroscopic biceps tenodesis, subacromial decompression, DCE, and limited debridement on 06/12/23  DM type 2, arthritis, LBP, HTN   PAIN:  Are you having pain? Yes NPRS:  6/10 currently  Location:  R shoulder, anterolateral, ant portion of UT  PRECAUTIONS: Other: per surgical protocol    WEIGHT BEARING RESTRICTIONS: Yes R UE  FALLS:  Has patient fallen in last 6 months? 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    PLOF: Independent  PATIENT GOALS: to be able sleep well without shoulder pain.  To perform activities without pain  NEXT MD VISIT:  06/28/23  OBJECTIVE:  Note: Objective measures were completed at Evaluation unless otherwise noted.  DIAGNOSTIC FINDINGS:  Pt is post op.  Pt had a MRI before surgery.                                                                                                                               TREATMENT:  08/03/23 Manual: Grade II inferior glides in flexion R shoulder PROM in flexion, scaption, ER Supine AAROM with 1# bar 2 x 10 Supine wand ER 1# bar 2 x 10 reps Supine serratus punch 2# 2 x 10 Pulleys in flexion and scaption 3x10 each Wall ladder flexion 1x 10 Bent shoulder extension 1# 2 x 10 Bent row 1# 2 x 10  6/4 Pulleys in flexion and scaption 2x10 each Supine wand flexion x 10 reps Supine flexion AROM 2x10 reps Supine wand ER x 10 reps Supine ER AROM x10  Supine serratus punch 2 x 10 reps Supine shoulder ABC x 1 reps S/L shoulder Abduction with assistance from PT x 10 reps   Pt received R shoulder PROM in flexion, scaption, ER, and IR in supine per pt and tissue tolerance w/n protocol ranges  5/28 Manual Therapy: STM to R UT seated Grade II and III posterior and inf glides to R GH joint   Pt received R shoulder PROM in flexion, scaption, ER, and IR in supine per pt and tissue tolerance w/n protocol ranges Supine wand flexion x 10 reps Supine flexion AROM 2x10 reps Supine wand ER x 10 reps Supine ER AROM x10  Supine serratus punch x 10 reps  R shoulder ROM: Flexion AROM:  119-121 (supine) Flexion AAROM: 125 (supine)  Updated HEP.  Pt received a HEP handout and was educated in correct form and appropriate frequency.  PT instructed pt to not perform into a painful range.    5/23  Manual:  Trigger point release to upper trap/ anterior shoulder  Pec  release with active abduction  Grade II and III inferior and posterior  glides  PROM into flexion and Er with distraction  There-ex:  Wand press x10 1 lb low RPE  Wand press 2x10 2lbs 4 Active ER 3x10   Neuro-re-ed Bent over row 2x10    5/21 Reviewed pt presentation, HEP compliance, pain level, and response to prior Rx.   Pt received R shoulder PROM in flexion, scaption, ER, and IR in supine per pt and tissue tolerance w/n protocol ranges  Supine wand flexion 2x10 Supine wand ER 2x10  Supine active flexion x 10 reps   PATIENT EDUCATION: Education details:  PT instructed pt to increase frequency of AAROM HEP.  dx, HEP, POC, relevant anatomy, ice usage, and exercise form.  PT answered pt's questions. Person educated: Patient Education method: Explanation, Demonstration, Tactile cues, Verbal cues, and Handouts Education comprehension: verbalized understanding, returned demonstration, verbal cues required, tactile cues required, and needs further education  HOME EXERCISE PROGRAM: Access Code: [email or web address] URL: [email or web address] Date: 06/16/2023 Prepared by: Marnie Siren    ASSESSMENT:  CLINICAL IMPRESSION: Patient with ROM to about 125 with manual and AAROM exercises. Patient stating near similar ROM pre-op. Today's session with focus on improving AAROM/AROM. Patient will continue to benefit from physical therapy in order to improve function and reduce impairment.      OBJECTIVE IMPAIRMENTS: decreased ROM, decreased strength, hypomobility, impaired flexibility, impaired UE functional use, and pain.   ACTIVITY LIMITATIONS: carrying, lifting, bathing, dressing, reach over head, and hygiene/grooming  PARTICIPATION LIMITATIONS: meal prep, cleaning, laundry, driving, shopping, community activity, and occupation  PERSONAL FACTORS: 1-2 comorbidities: DM type 2 arthritis are also affecting patient's functional outcome.   REHAB POTENTIAL: Good  CLINICAL  DECISION MAKING: Stable/uncomplicated  EVALUATION COMPLEXITY: Low   GOALS:   SHORT TERM GOALS:   Pt will be independent with HEP for improved pain, ROM, and function.  Baseline: Goal status: INITIAL Target date:   07/12/2023  2.  Pt will wean out of sling per MD instruction without adverse effects.  Baseline:  Goal status: GOAL MET Target date:   07/12/2023  3.  Pt will progress with PROM and AAROM per protocol without adverse effects for improved shoulder mobility and function. Baseline:  Goal status: INITIAL Target date:   07/12/2023  4.  Pt will demo R shoulder PROM/AAROM to be at least 140/120 deg in flexion (supine) and 50/45 deg in ER for improved shoulder mobility and stiffness. Baseline:  Goal status: INITIAL Target date:   07/26/2023   5.  Pt will demo at least 100 deg of R shoulder elevation in standing without significant UT compensatory hike.   Baseline:  Goal status: INITIAL Target date:   08/09/2023  6.  Ptwill be able to perform her self care activities with no > than minimal difficulty.  Baseline:  Goal status: INITIAL Target date:   09/06/2023   LONG TERM GOALS: Target date:  10/04/2023   Pt will demo R shoulder AROM to be [facility] t/o for performance of ADLs and IADLs. Baseline:  Goal status: INITIAL  2.  Pt will be able to perform her ADLs and IADLs including household chores without significant difficulty and pain.  Baseline:  Goal status: INITIAL  3.  Pt will be able to perform her normal reaching and overhead activities without significant limitation and pain.  Baseline:  Goal status: INITIAL  4.  Pt will return to work with expected limitations without adverse effects.   Baseline:  Goal status: INITIAL  5.  Pt will demo R shoulder strength to be at least  4+/5 MMT in flexion, ER, and IR and 4/5 strength in abduction for performance of work activities and functional lifting/carrying.   Baseline:  Goal status: INITIAL   PLAN:  PT FREQUENCY:  1x/wk x 3-4 weeks and 2x/wk afterwards  PT DURATION: other: 16 weeks  PLANNED INTERVENTIONS: 97164- PT Re-evaluation, 97750- Physical Performance Testing, 97110-Therapeutic exercises, 97530- Therapeutic activity, [id number]- Neuromuscular re-education, 97535- Self Care, 60454- Manual therapy, [id number]- Aquatic Therapy, U9811- Electrical stimulation (unattended), [id number]- Electrical stimulation (manual), 97035- Ultrasound, Patient/Family education, Taping, Dry Needling, Joint mobilization, Spinal mobilization, Scar mobilization, Cryotherapy, and Moist heat  PLAN FOR NEXT SESSION: cont per biceps tenodesis protocol.  Review and perform HEP.      Perfecto Bracket Diezel Mazur, PT, DPT 08/03/2023, 8:05 AM

## 2023-08-07 ENCOUNTER — Ambulatory Visit (HOSPITAL_BASED_OUTPATIENT_CLINIC_OR_DEPARTMENT_OTHER): Admitting: Orthopaedic Surgery

## 2023-08-07 ENCOUNTER — Ambulatory Visit (HOSPITAL_BASED_OUTPATIENT_CLINIC_OR_DEPARTMENT_OTHER)

## 2023-08-07 DIAGNOSIS — M25552 Pain in left hip: Secondary | ICD-10-CM

## 2023-08-07 MED ORDER — LIDOCAINE HCL 1 % IJ SOLN: 4.0000 mL | INTRAMUSCULAR | Status: AC | PRN

## 2023-08-07 MED ORDER — TRIAMCINOLONE ACETONIDE 40 MG/ML IJ SUSP: 80.0000 mg | INTRAMUSCULAR | Status: AC | PRN

## 2023-08-07 NOTE — Progress Notes (Signed)
 Post Operative Evaluation    Procedure/Date of Surgery: Right shoulder arthroscopy with distal clavicle resection and subacromial decompression  Interval History:   Presents today 6 weeks status post the above procedure overall doing well.  She has been working with physical therapy.  He is still having some tenderness and soreness over the biceps.  With regard to the left hip she is experiencing pain deep in the crease.  This is has not been ongoing for the better part of this year.  She has been working in physical therapy for strengthening without any relief.   PMH/PSH/Family History/Social History/Meds/Allergies:    Past Medical History:  Diagnosis Date   Abnormal Pap smear of cervix    ascus hpv hr neg   Allergy    Arthritis    Chronic headaches    Depression    Diabetes mellitus without complication (HCC)    GERD (gastroesophageal reflux disease)    Hypertension    Low back pain    UTI (urinary tract infection)    Past Surgical History:  Procedure Laterality Date   CERVICAL BIOPSY  W/ LOOP ELECTRODE EXCISION     COLPOSCOPY     CIN2/3   SHOULDER ARTHROSCOPY WITH DISTAL CLAVICLE RESECTION Right 06/12/2023   Procedure: RIGHT SHOULDER ARTHROSCOPY WITH DISTAL CLAVICLE RESECTION, SUBPECTORIAL TENODESIS;  Surgeon: Wilhelmenia Harada, MD;  Location: [facility];  Service: Orthopedics;  Laterality: Right;   SUBACROMIAL DECOMPRESSION Right 06/12/2023   Procedure: RIGHT SUBACROMIAL DECOMPRESSION;  Surgeon: Wilhelmenia Harada, MD;  Location: [facility];  Service: Orthopedics;  Laterality: Right;   TUBAL LIGATION     Social History   Socioeconomic History   Marital status: Married    Spouse name: Not on file   Number of children: Not on file   Years of education: Not on file   Highest education level: Not on file  Occupational History   Not on file  Tobacco Use   Smoking status: Former    Types: Cigars   Smokeless  tobacco: Never  Vaping Use   Vaping status: Never Used  Substance and Sexual Activity   Alcohol use: Yes    Alcohol/week: 0.0 - 2.0 standard drinks of alcohol    Comment: occ   Drug use: No   Sexual activity: Yes    Partners: Male    Birth control/protection: Surgical, Post-menopausal    Comment: tubal ligation  Other Topics Concern   Not on file  Social History Narrative   Married May 13th 2017. 2 children- son 3 (married) and daughter 83. Granddaughter august 2017.       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      Hobbies: time with family   Social Drivers of Corporate investment banker Strain: Not on file  Food Insecurity: Not on file  Transportation Needs: Not on file  Physical Activity: Not on file  Stress: Not on file  Social Connections: Unknown (08/03/2021)   Received [facility]   Social Network    Social Network: Not on file   Family History  Problem Relation Age of Onset   Colon polyps Mother    Diabetes Mother    Hypertension Mother    Hyperlipidemia Mother    Kidney disease Mother  has fistula- could need dialysis at any time   Colon polyps Father    Diabetes Father        mother   Hyperlipidemia Father        mother   Hypertension Father        mother   Colon cancer Father        46s, and grandmother   Lung cancer Father        in 52s   Deep vein thrombosis Father        not during dtime of cancer. also paternal aunts reported   Cancer - Other Sister        duodenal   Stomach cancer Sister    Colon cancer Paternal Grandmother    Colon cancer Maternal Aunt    Esophageal cancer Neg Hx    Liver cancer Neg Hx    Pancreatic cancer Neg Hx    Rectal cancer Neg Hx    Allergies  Allergen Reactions   Amoxicillin Swelling    Lip swelling   Nsaids     bleeding   Current Outpatient Medications  Medication Sig Dispense Refill   amLODipine  (NORVASC ) 2.5 MG tablet TAKE 1 TABLET BY MOUTH EVERY DAY 90  tablet 3   aspirin  EC 325 MG tablet Take 1 tablet (325 mg total) by mouth daily. 14 tablet 0   azelastine  (ASTELIN ) 0.1 % nasal spray Place 2 sprays into both nostrils 2 (two) times daily. 90 mL 3   clotrimazole -betamethasone  (LOTRISONE ) cream APPLY TO AFFECTED AREA TWICE A DAY 45 g 1   Continuous Blood Gluc Sensor (FREESTYLE LIBRE 3 SENSOR) MISC Quantity: 2 sensors/month NDC# [phone] Sensor Refills: PRN or 12 refills annually 2 each 12   diclofenac  Sodium (VOLTAREN ) 1 % GEL Apply 4 g topically 4 (four) times daily. To affected joint. 100 g 11   doxycycline  (VIBRA -TABS) 100 MG tablet Take 1 tablet (100 mg total) by mouth 2 (two) times daily. 20 tablet 1   fluticasone  (FLONASE ) 50 MCG/ACT nasal spray SPRAY 2 SPRAYS INTO EACH NOSTRIL EVERY DAY 48 mL 3   gabapentin  (NEURONTIN ) 300 MG capsule TAKE 2 CAPSULES BY MOUTH AT BEDTIME 180 capsule 1   hydrochlorothiazide  (HYDRODIURIL ) 25 MG tablet TAKE 1 TABLET (25 MG TOTAL) BY MOUTH DAILY. 90 tablet 3   ipratropium (ATROVENT ) 0.03 % nasal spray PLACE 2 SPRAYS INTO BOTH NOSTRILS EVERY 12 (TWELVE) HOURS. 90 mL 2   meloxicam  (MOBIC ) 7.5 MG tablet TAKE 1 TABLET (7.5 MG TOTAL) BY MOUTH DAILY AS NEEDED. FOR PAIN 90 tablet 1   oxyCODONE  (ROXICODONE ) 5 MG immediate release tablet Take 1 tablet (5 mg total) by mouth every 4 (four) hours as needed for severe pain (pain score 7-10) or breakthrough pain. 15 tablet 0   pantoprazole  (PROTONIX ) 40 MG tablet TAKE 1 TABLET BY MOUTH EVERY DAY 90 tablet 3   rosuvastatin  (CRESTOR ) 10 MG tablet TAKE 1 TABLET (10 MG TOTAL) BY MOUTH ONCE A WEEK. 13 tablet 3   Semaglutide ,0.25 or 0.5MG /DOS, (OZEMPIC , 0.25 OR 0.5 MG/DOSE,) 2 MG/3ML SOPN INJECT 0.25 MG AS DIRECTED ONCE A WEEK FOR 28 DAYS, THEN 0.5 MG ONCE A WEEK FOR 28 DAYS 3 mL 5   tiZANidine  (ZANAFLEX ) 4 MG tablet TAKE 0.5-1 TABLET (2-4 MG TOTAL) BY MOUTH EVERY 8 (EIGHT) HOURS AS NEEDED FOR MUSCLE SPASMS. 30 tablet 2   No current facility-administered medications for this  visit.   No results found.  Review of Systems:   A ROS was performed including  pertinent positives and negatives as documented in the HPI.   Musculoskeletal Exam:    Right shoulder incisions are well-appearing without erythema or drainage.  There is a small rash about the anterior aspect of the elbow.  In the spine position she can forward elevate to 90 degrees actively with external rotation at side to 30.  Distal neurosensory exam is intact   Left hip with a positive FADIR with tenderness about the femoral acetabular joint.  30 degrees internal rotation and 90 degrees of flexion with pain 45 degrees external rotation without pain.  Good abduction strength  Imaging:      I personally reviewed and interpreted the radiographs.   Assessment:   6 Weeks status post right shoulder distal clavicle resection subacromial decompression overall doing well.  At this time her range of motion is improving nicely.  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  Overall given the fact that she has trialed physical therapy and strengthening without any resolution of this pain I would like to obtain an MRI.  I would also like to plan to perform a diagnostic and therapeutic injection of the left hip joint at this time  Plan :    - plan for left hip ultrasound-guided injection and follow-up to discuss results of left hip MRI    Procedure Note  Patient: Tonya Suarez             Date of Birth: [birth year]           MRN: [id number]             Visit Date: 08/07/2023  Procedures: Visit Diagnoses:  1. Pain in left hip     Large Joint Inj: L hip joint on 08/07/2023 12:26 PM Indications: pain Details: 22 G 3.5 in needle, ultrasound-guided anterolateral approach  Arthrogram: No  Medications: 4 mL lidocaine  1 %; 80 mg triamcinolone  acetonide 40 MG/ML Outcome: tolerated well, no immediate complications Procedure, treatment  alternatives, risks and benefits explained, specific risks discussed. Consent was given by the patient. Immediately prior to procedure a time out was called to verify the correct patient, procedure, equipment, support staff and site/side marked as required. Patient was prepped and draped in the usual sterile fashion.             I personally saw and evaluated the patient, and participated in the management and treatment plan.  Wilhelmenia Harada, MD Attending Physician, Orthopedic Surgery  This document was dictated using Dragon voice recognition software. A reasonable attempt at proof reading has been made to minimize errors.

## 2023-08-10 ENCOUNTER — Telehealth (HOSPITAL_BASED_OUTPATIENT_CLINIC_OR_DEPARTMENT_OTHER): Payer: Self-pay | Admitting: Physical Therapy

## 2023-08-10 ENCOUNTER — Ambulatory Visit (HOSPITAL_BASED_OUTPATIENT_CLINIC_OR_DEPARTMENT_OTHER): Payer: Self-pay | Admitting: Physical Therapy

## 2023-08-10 NOTE — Telephone Encounter (Signed)
 Patient no show, left message for patient to make her aware of missed appointment and reminded of next. Reminded her of attendance policy.   8:34 AM, 08/10/23 [name] PT, DPT Physical Therapist at [facility]

## 2023-08-17 ENCOUNTER — Encounter (HOSPITAL_BASED_OUTPATIENT_CLINIC_OR_DEPARTMENT_OTHER): Payer: Self-pay | Admitting: Physical Therapy

## 2023-08-17 ENCOUNTER — Ambulatory Visit (HOSPITAL_BASED_OUTPATIENT_CLINIC_OR_DEPARTMENT_OTHER): Payer: Self-pay | Admitting: Physical Therapy

## 2023-08-17 DIAGNOSIS — M25611 Stiffness of right shoulder, not elsewhere classified: Secondary | ICD-10-CM

## 2023-08-17 DIAGNOSIS — M25511 Pain in right shoulder: Secondary | ICD-10-CM | POA: Diagnosis not present

## 2023-08-17 DIAGNOSIS — M6281 Muscle weakness (generalized): Secondary | ICD-10-CM

## 2023-08-17 NOTE — Therapy (Signed)
 OUTPATIENT PHYSICAL THERAPY SHOULDER TREATMENT   Patient Name: Tonya Suarez MRN: [id number] DOB:04/06/1970, 53 y.o., female Today's Date: 08/17/2023  END OF SESSION:  PT End of Session - 08/17/23 2128     Visit Number 9    Number of Visits 29    Date for PT Re-Evaluation 09/06/23    Authorization Type U[employer]    PT Start Time 1015    PT Stop Time 1058    PT Time Calculation (min) 43 min    Activity Tolerance Patient tolerated treatment well    Behavior During Therapy WFL for tasks assessed/performed              Past Medical History:  Diagnosis Date   Abnormal Pap smear of cervix    ascus hpv hr neg   Allergy    Arthritis    Chronic headaches    Depression    Diabetes mellitus without complication (HCC)    GERD (gastroesophageal reflux disease)    Hypertension    Low back pain    UTI (urinary tract infection)    Past Surgical History:  Procedure Laterality Date   CERVICAL BIOPSY  W/ LOOP ELECTRODE EXCISION     COLPOSCOPY     CIN2/3   SHOULDER ARTHROSCOPY WITH DISTAL CLAVICLE RESECTION Right 06/12/2023   Procedure: RIGHT SHOULDER ARTHROSCOPY WITH DISTAL CLAVICLE RESECTION, SUBPECTORIAL TENODESIS;  Surgeon: Genelle Standing, MD;  Location: [facility];  Service: Orthopedics;  Laterality: Right;   SUBACROMIAL DECOMPRESSION Right 06/12/2023   Procedure: RIGHT SUBACROMIAL DECOMPRESSION;  Surgeon: Genelle Standing, MD;  Location: [facility];  Service: Orthopedics;  Laterality: Right;   TUBAL LIGATION     Patient Active Problem List   Diagnosis Date Noted   Impingement syndrome of right shoulder 06/12/2023   Arthritis of right acromioclavicular joint 06/12/2023   Biceps tendinitis of right upper extremity 06/12/2023   Hyperlipidemia associated with type 2 diabetes mellitus (HCC) 05/25/2021   Controlled diabetes mellitus (HCC) 05/13/2021   Recurrent major depressive disorder, in full remission (HCC) 05/11/2021   Bilateral sciatica  12/08/2020   Mass of thigh, left 08/26/2020   Pain in right hand 09/27/2018   Former smoker 07/19/2018   Constipation 07/19/2018   B12 deficiency 07/19/2018   Restless legs 07/19/2018   Ingrown toenail 12/20/2016   History of adenomatous polyp of colon 09/23/2016   Migraines 05/06/2016   Family history of cancer of GI tract 12/06/2012   HTN (hypertension) 11/21/2011   MENORRHAGIA 09/23/2009   PLANTAR FASCIITIS 09/20/2007   Osteoarthritis 09/19/2006   Depression 09/06/2006   Allergic rhinitis 09/06/2006   GERD 09/06/2006     REFERRING PROVIDER: Genelle Standing, MD  REFERRING DIAG: M75.41 (ICD-10-CM) - Impingement syndrome of right shoulder  S/p Arthroscopic limited debridement  Arthroscopic distal clavicle excision  Arthroscopic subacromial decompression   Arthroscopic biceps tenodesis   THERAPY DIAG:  Right shoulder pain, unspecified chronicity  Stiffness of right shoulder, not elsewhere classified  Muscle weakness (generalized)  Rationale for Evaluation and Treatment: Rehabilitation  ONSET DATE: DOS  06/12/2023  SUBJECTIVE:  SUBJECTIVE STATEMENT: The patient reports she is still having a little pain in the front. She feels like its a nerve pain going down her bicep.      Hand dominance: Right  PERTINENT HISTORY: R shoulder arthroscopic biceps tenodesis, subacromial decompression, DCE, and limited debridement on 06/12/23  DM type 2, arthritis, LBP, HTN   PAIN:  Are you having pain? Yes NPRS:  6/10 currently  Location:  R shoulder, anterolateral, ant portion of UT  PRECAUTIONS: Other: per surgical protocol    WEIGHT BEARING RESTRICTIONS: Yes R UE  FALLS:  Has patient fallen in last 6 months? No  LIVING ENVIRONMENT: Lives with: lives with their family Lives in: 1 story  home   OCCUPATION: Night shift supervisor at [employer]  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    PLOF: Independent  PATIENT GOALS: to be able sleep well without shoulder pain.  To perform activities without pain  NEXT MD VISIT:  06/28/23  OBJECTIVE:  Note: Objective measures were completed at Evaluation unless otherwise noted.  DIAGNOSTIC FINDINGS:  Pt is post op.  Pt had a MRI before surgery.                                                                                                                               TREATMENT:  6/26   Manual:  Grade II and III inferior and posterior glides  Trigger point release to the upper trap and anterior shoulder Active release of pec  Review of self biceps STM   There-ex:  Supine wand press 2 lbs 2x10  Supine wand flexion 2lbs 2x10   Neuro-re-ed Supine ABC 1 lb 2x Row with band red 2x12  Shoulder extension 2x12  All with cueing to keep good posture   08/03/23 Manual: Grade II inferior glides in flexion R shoulder PROM in flexion, scaption, ER Supine AAROM with 1# bar 2 x 10 Supine wand ER 1# bar 2 x 10 reps Supine serratus punch 2# 2 x 10 Pulleys in flexion and scaption 3x10 each Wall ladder flexion 1x 10 Bent shoulder extension 1# 2 x 10 Bent row 1# 2 x 10  6/4 Pulleys in flexion and scaption 2x10 each Supine wand flexion x 10 reps Supine flexion AROM 2x10 reps Supine wand ER x 10 reps Supine ER AROM x10  Supine serratus punch 2 x 10 reps Supine shoulder ABC x 1 reps S/L shoulder Abduction with assistance from PT x 10 reps   Pt received R shoulder PROM in flexion, scaption, ER, and IR in supine per pt and tissue tolerance w/n protocol ranges  5/28 Manual Therapy: STM to R UT seated Grade II and III posterior and inf glides to R GH joint   Pt received R shoulder PROM in flexion, scaption, ER, and IR in supine per pt and tissue tolerance w/n protocol ranges Supine wand flexion x 10  reps Supine flexion AROM 2x10 reps  Supine wand ER x 10 reps Supine ER AROM x10  Supine serratus punch x 10 reps  R shoulder ROM: Flexion AROM:  119-121 (supine) Flexion AAROM: 125 (supine)  Updated HEP.  Pt received a HEP handout and was educated in correct form and appropriate frequency.  PT instructed pt to not perform into a painful range.    5/23  Manual:  Trigger point release to upper trap/ anterior shoulder  Pec release with active abduction  Grade II and III inferior and posterior glides  PROM into flexion and Er with distraction  There-ex:  Wand press x10 1 lb low RPE  Wand press 2x10 2lbs 4 Active ER 3x10   Neuro-re-ed Bent over row 2x10    5/21 Reviewed pt presentation, HEP compliance, pain level, and response to prior Rx.   Pt received R shoulder PROM in flexion, scaption, ER, and IR in supine per pt and tissue tolerance w/n protocol ranges  Supine wand flexion 2x10 Supine wand ER 2x10  Supine active flexion x 10 reps   PATIENT EDUCATION: Education details:  PT instructed pt to increase frequency of AAROM HEP.  dx, HEP, POC, relevant anatomy, ice usage, and exercise form.  PT answered pt's questions. Person educated: Patient Education method: Explanation, Demonstration, Tactile cues, Verbal cues, and Handouts Education comprehension: verbalized understanding, returned demonstration, verbal cues required, tactile cues required, and needs further education  HOME EXERCISE PROGRAM: Access Code: [email or web address] URL: [email or web address] Date: 06/16/2023 Prepared by: Mose Minerva    ASSESSMENT:  CLINICAL IMPRESSION: Therapy progress scapular exercises to the bands so she can do them at home.  We reviewed how to greater exercises.  She is advised to make sure her exercises feel challenging.  She was given a green band to progress to she feels and red band becomes easy.  She is making good progress.  Her external rotation improved with manual  therapy.  She continues have tenderness palpation of her biceps.  Therapy will continue to progress as tolerated.    OBJECTIVE IMPAIRMENTS: decreased ROM, decreased strength, hypomobility, impaired flexibility, impaired UE functional use, and pain.   ACTIVITY LIMITATIONS: carrying, lifting, bathing, dressing, reach over head, and hygiene/grooming  PARTICIPATION LIMITATIONS: meal prep, cleaning, laundry, driving, shopping, community activity, and occupation  PERSONAL FACTORS: 1-2 comorbidities: DM type 2 arthritis are also affecting patient's functional outcome.   REHAB POTENTIAL: Good  CLINICAL DECISION MAKING: Stable/uncomplicated  EVALUATION COMPLEXITY: Low   GOALS:   SHORT TERM GOALS:   Pt will be independent with HEP for improved pain, ROM, and function.  Baseline: Goal status: INITIAL Target date:   07/12/2023  2.  Pt will wean out of sling per MD instruction without adverse effects.  Baseline:  Goal status: GOAL MET Target date:   07/12/2023  3.  Pt will progress with PROM and AAROM per protocol without adverse effects for improved shoulder mobility and function. Baseline:  Goal status: INITIAL Target date:   07/12/2023  4.  Pt will demo R shoulder PROM/AAROM to be at least 140/120 deg in flexion (supine) and 50/45 deg in ER for improved shoulder mobility and stiffness. Baseline:  Goal status: INITIAL Target date:   07/26/2023   5.  Pt will demo at least 100 deg of R shoulder elevation in standing without significant UT compensatory hike.   Baseline:  Goal status: INITIAL Target date:   08/09/2023  6.  Ptwill be able to perform her self care activities with no > than minimal difficulty.  Baseline:  Goal  status: INITIAL Target date:   09/06/2023   LONG TERM GOALS: Target date:  10/04/2023   Pt will demo R shoulder AROM to be [facility] t/o for performance of ADLs and IADLs. Baseline:  Goal status: INITIAL  2.  Pt will be able to perform her ADLs and IADLs  including household chores without significant difficulty and pain.  Baseline:  Goal status: INITIAL  3.  Pt will be able to perform her normal reaching and overhead activities without significant limitation and pain.  Baseline:  Goal status: INITIAL  4.  Pt will return to work with expected limitations without adverse effects.   Baseline:  Goal status: INITIAL  5.  Pt will demo R shoulder strength to be at least 4+/5 MMT in flexion, ER, and IR and 4/5 strength in abduction for performance of work activities and functional lifting/carrying.   Baseline:  Goal status: INITIAL   PLAN:  PT FREQUENCY: 1x/wk x 3-4 weeks and 2x/wk afterwards  PT DURATION: other: 16 weeks  PLANNED INTERVENTIONS: [id number]- PT Re-evaluation, 97750- Physical Performance Testing, 97110-Therapeutic exercises, 97530- Therapeutic activity, [id number]- Neuromuscular re-education, 97535- Self Care, [id number]- Manual therapy, [id number]- Aquatic Therapy, [id number]- Electrical stimulation (unattended), [id number]- Electrical stimulation (manual), 97035- Ultrasound, Patient/Family education, Taping, Dry Needling, Joint mobilization, Spinal mobilization, Scar mobilization, Cryotherapy, and Moist heat  PLAN FOR NEXT SESSION: cont per biceps tenodesis protocol.  Review and perform HEP.      Alm JINNY Don, PT, DPT 08/17/2023, 9:30 PM

## 2023-08-22 ENCOUNTER — Ambulatory Visit (HOSPITAL_BASED_OUTPATIENT_CLINIC_OR_DEPARTMENT_OTHER): Attending: Orthopaedic Surgery | Admitting: Physical Therapy

## 2023-08-22 ENCOUNTER — Encounter (HOSPITAL_BASED_OUTPATIENT_CLINIC_OR_DEPARTMENT_OTHER): Payer: Self-pay | Admitting: Physical Therapy

## 2023-08-22 DIAGNOSIS — M25511 Pain in right shoulder: Secondary | ICD-10-CM | POA: Diagnosis present

## 2023-08-22 DIAGNOSIS — M6281 Muscle weakness (generalized): Secondary | ICD-10-CM | POA: Insufficient documentation

## 2023-08-22 DIAGNOSIS — M25611 Stiffness of right shoulder, not elsewhere classified: Secondary | ICD-10-CM | POA: Insufficient documentation

## 2023-08-22 NOTE — Therapy (Unsigned)
 OUTPATIENT PHYSICAL THERAPY SHOULDER TREATMENT   Patient Name: Tonya Suarez MRN: [id number] DOB:15-Feb-1971, 53 y.o., female Today's Date: 08/22/2023  END OF SESSION:  PT End of Session - 08/22/23 1552     Visit Number 10    Number of Visits 29    Date for PT Re-Evaluation 09/06/23    Authorization Type UHC    PT Start Time 1547              Past Medical History:  Diagnosis Date   Abnormal Pap smear of cervix    ascus hpv hr neg   Allergy    Arthritis    Chronic headaches    Depression    Diabetes mellitus without complication (HCC)    GERD (gastroesophageal reflux disease)    Hypertension    Low back pain    UTI (urinary tract infection)    Past Surgical History:  Procedure Laterality Date   CERVICAL BIOPSY  W/ LOOP ELECTRODE EXCISION     COLPOSCOPY     CIN2/3   SHOULDER ARTHROSCOPY WITH DISTAL CLAVICLE RESECTION Right 06/12/2023   Procedure: RIGHT SHOULDER ARTHROSCOPY WITH DISTAL CLAVICLE RESECTION, SUBPECTORIAL TENODESIS;  Surgeon: Genelle Standing, MD;  Location: [facility];  Service: Orthopedics;  Laterality: Right;   SUBACROMIAL DECOMPRESSION Right 06/12/2023   Procedure: RIGHT SUBACROMIAL DECOMPRESSION;  Surgeon: Genelle Standing, MD;  Location: [facility];  Service: Orthopedics;  Laterality: Right;   TUBAL LIGATION     Patient Active Problem List   Diagnosis Date Noted   Impingement syndrome of right shoulder 06/12/2023   Arthritis of right acromioclavicular joint 06/12/2023   Biceps tendinitis of right upper extremity 06/12/2023   Hyperlipidemia associated with type 2 diabetes mellitus (HCC) 05/25/2021   Controlled diabetes mellitus (HCC) 05/13/2021   Recurrent major depressive disorder, in full remission (HCC) 05/11/2021   Bilateral sciatica 12/08/2020   Mass of thigh, left 08/26/2020   Pain in right hand 09/27/2018   Former smoker 07/19/2018   Constipation 07/19/2018   B12 deficiency 07/19/2018   Restless legs  07/19/2018   Ingrown toenail 12/20/2016   History of adenomatous polyp of colon 09/23/2016   Migraines 05/06/2016   Family history of cancer of GI tract 12/06/2012   HTN (hypertension) 11/21/2011   MENORRHAGIA 09/23/2009   PLANTAR FASCIITIS 09/20/2007   Osteoarthritis 09/19/2006   Depression 09/06/2006   Allergic rhinitis 09/06/2006   GERD 09/06/2006     REFERRING PROVIDER: Genelle Standing, MD  REFERRING DIAG: M75.41 (ICD-10-CM) - Impingement syndrome of right shoulder  S/p Arthroscopic limited debridement  Arthroscopic distal clavicle excision  Arthroscopic subacromial decompression   Arthroscopic biceps tenodesis   THERAPY DIAG:  No diagnosis found.  Rationale for Evaluation and Treatment: Rehabilitation  ONSET DATE: DOS  06/12/2023  SUBJECTIVE:  SUBJECTIVE STATEMENT: Pt is able to sleep on her shoulder for a short amount of time.  Pt reports improved shoulder ROM.  Pt reports improved performance of self care activities including bathing, dressing, and hair care.  Pt has used her R UE to clean her truck.  Pt is limited with reaching and UE elevation.  Pt is unable to perform overhead activities.  Pt reports slight difficulty with self care activities. Pt reports increased pain after prior Rx.       Hand dominance: Right  PERTINENT HISTORY: R shoulder arthroscopic biceps tenodesis, subacromial decompression, DCE, and limited debridement on 06/12/23  DM type 2, arthritis, LBP, HTN   PAIN:  Are you having pain? Yes NPRS:  6/10 currently, 10/10 worst, 0/10 best Location: anterior and lateral R shoulder  PRECAUTIONS: Other: per surgical protocol    WEIGHT BEARING RESTRICTIONS: Yes R UE  FALLS:  Has patient fallen in last 6 months? 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    PLOF: Independent  PATIENT GOALS: to be able sleep well without shoulder pain.  To perform activities without pain  NEXT MD VISIT:  06/28/23  OBJECTIVE:  Note: Objective measures were completed at Evaluation unless otherwise noted.  DIAGNOSTIC FINDINGS:  Pt is post op.  Pt had a MRI before surgery.                                                                                                                               TREATMENT:  7/1 Shoulder pulleys in flexion and scaption x 20 reps each  Pt received R shoulder PROM in flexion, abd, ER, and IR per pt and tissue tolerance.   R shoulder ROM: PROM:   Flexion:  139 Abd:  93 ER:  37 IR:  42  R shoulder AROM:  (supine) Flexion:  122 ER:  32  R shoulder AAROM:  128/127 Supine/standing  Supine shoulder ABC x 1 rep Standing wand flexion x 6 reps in front of mirror Standing wand scaption x10 reps in front of mirror Standing rows with RTB 2x12  PATIENT SURVEYS:  UEFI:  Initial/Current:  28/80   6/26   Manual:  Grade II and III inferior and posterior glides  Trigger point release to the upper trap and anterior shoulder Active release of pec  Review of self biceps STM   There-ex:  Supine wand press 2 lbs 2x10  Supine wand flexion 2lbs 2x10   Neuro-re-ed Supine ABC 1 lb 2x Row with band red 2x12  Shoulder extension 2x12  All with cueing to keep good posture   08/03/23 Manual: Grade II inferior glides in flexion R shoulder PROM in flexion, scaption, ER Supine AAROM with 1# bar 2 x 10 Supine wand ER 1# bar 2 x 10 reps  Supine serratus punch 2# 2 x 10 Pulleys in flexion and scaption 3x10 each Wall ladder flexion 1x 10 Bent shoulder extension 1# 2 x 10 Bent row 1# 2 x 10  6/4 Pulleys in flexion and scaption 2x10 each Supine wand flexion x 10 reps Supine  flexion AROM 2x10 reps Supine wand ER x 10 reps Supine ER AROM x10  Supine serratus punch 2 x 10 reps Supine shoulder ABC x 1 reps S/L shoulder Abduction with assistance from PT x 10 reps   Pt received R shoulder PROM in flexion, scaption, ER, and IR in supine per pt and tissue tolerance w/n protocol ranges  5/28 Manual Therapy: STM to R UT seated Grade II and III posterior and inf glides to R GH joint   Pt received R shoulder PROM in flexion, scaption, ER, and IR in supine per pt and tissue tolerance w/n protocol ranges Supine wand flexion x 10 reps Supine flexion AROM 2x10 reps Supine wand ER x 10 reps Supine ER AROM x10  Supine serratus punch x 10 reps  R shoulder ROM: Flexion AROM:  119-121 (supine) Flexion AAROM: 125 (supine)  Updated HEP.  Pt received a HEP handout and was educated in correct form and appropriate frequency.  PT instructed pt to not perform into a painful range.    5/23  Manual:  Trigger point release to upper trap/ anterior shoulder  Pec release with active abduction  Grade II and III inferior and posterior glides  PROM into flexion and Er with distraction  There-ex:  Wand press x10 1 lb low RPE  Wand press 2x10 2lbs 4 Active ER 3x10   Neuro-re-ed Bent over row 2x10    5/21 Reviewed pt presentation, HEP compliance, pain level, and response to prior Rx.   Pt received R shoulder PROM in flexion, scaption, ER, and IR in supine per pt and tissue tolerance w/n protocol ranges  Supine wand flexion 2x10 Supine wand ER 2x10  Supine active flexion x 10 reps   PATIENT EDUCATION: Education details:  PT instructed pt to increase frequency of AAROM HEP.  dx, HEP, POC, relevant anatomy, ice usage, and exercise form.  PT answered pt's questions. Person educated: Patient Education method: Explanation, Demonstration, Tactile cues, Verbal cues, and Handouts Education comprehension: verbalized understanding, returned demonstration, verbal cues required,  tactile cues required, and needs further education  HOME EXERCISE PROGRAM: Access Code: [email or web address] URL: [email or web address] Date: 06/16/2023 Prepared by: Mose Minerva    ASSESSMENT:  CLINICAL IMPRESSION: Pt is 10 weeks and 1 day  Therapy progress scapular exercises to the bands so she can do them at home.  We reviewed how to greater exercises.  She is advised to make sure her exercises feel challenging.  She was given a green band to progress to she feels and red band becomes easy.  She is making good progress.  Her external rotation improved with manual therapy.  She continues have tenderness palpation of her biceps.  Therapy will continue to progress as tolerated.    OBJECTIVE IMPAIRMENTS: decreased ROM, decreased strength, hypomobility, impaired flexibility, impaired UE functional use, and pain.   ACTIVITY LIMITATIONS: carrying, lifting, bathing, dressing, reach over head, and hygiene/grooming  PARTICIPATION LIMITATIONS: meal prep, cleaning, laundry, driving, shopping, community activity, and occupation  PERSONAL FACTORS: 1-2 comorbidities: DM type 2 arthritis are also affecting patient's functional outcome.   REHAB POTENTIAL: Good  CLINICAL DECISION MAKING: Stable/uncomplicated  EVALUATION COMPLEXITY: Low   GOALS:   SHORT TERM GOALS:   Pt will  be independent with HEP for improved pain, ROM, and function.  Baseline: Goal status: INITIAL Target date:   07/12/2023  2.  Pt will wean out of sling per MD instruction without adverse effects.  Baseline:  Goal status: GOAL MET Target date:   07/12/2023  3.  Pt will progress with PROM and AAROM per protocol without adverse effects for improved shoulder mobility and function. Baseline:  Goal status: INITIAL Target date:   07/12/2023  4.  Pt will demo R shoulder PROM/AAROM to be at least 140/120 deg in flexion (supine) and 50/45 deg in ER for improved shoulder mobility and stiffness. Baseline:  Goal status:  INITIAL Target date:   07/26/2023   5.  Pt will demo at least 100 deg of R shoulder elevation in standing without significant UT compensatory hike.   Baseline:  Goal status: INITIAL Target date:   08/09/2023  6.  Ptwill be able to perform her self care activities with no > than minimal difficulty.  Baseline:  Goal status: GOAL MET  7/1 Target date:   09/06/2023   LONG TERM GOALS: Target date:  10/04/2023   Pt will demo R shoulder AROM to be [facility] t/o for performance of ADLs and IADLs. Baseline:  Goal status: INITIAL  2.  Pt will be able to perform her ADLs and IADLs including household chores without significant difficulty and pain.  Baseline:  Goal status: INITIAL  3.  Pt will be able to perform her normal reaching and overhead activities without significant limitation and pain.  Baseline:  Goal status: INITIAL  4.  Pt will return to work with expected limitations without adverse effects.   Baseline:  Goal status: INITIAL  5.  Pt will demo R shoulder strength to be at least 4+/5 MMT in flexion, ER, and IR and 4/5 strength in abduction for performance of work activities and functional lifting/carrying.   Baseline:  Goal status: INITIAL   PLAN:  PT FREQUENCY: 1x/wk x 3-4 weeks and 2x/wk afterwards  PT DURATION: other: 16 weeks  PLANNED INTERVENTIONS: 97164- PT Re-evaluation, 97750- Physical Performance Testing, 97110-Therapeutic exercises, 97530- Therapeutic activity, [id number]- Neuromuscular re-education, 97535- Self Care, [id number]- Manual therapy, [id number]- Aquatic Therapy, [id number]- Electrical stimulation (unattended), [id number]- Electrical stimulation (manual), 97035- Ultrasound, Patient/Family education, Taping, Dry Needling, Joint mobilization, Spinal mobilization, Scar mobilization, Cryotherapy, and Moist heat  PLAN FOR NEXT SESSION: cont per biceps tenodesis protocol.  Review and perform HEP.      Mose Minerva, PT, DPT 08/22/2023, 3:52 PM

## 2023-08-30 ENCOUNTER — Ambulatory Visit (HOSPITAL_BASED_OUTPATIENT_CLINIC_OR_DEPARTMENT_OTHER): Admitting: Physical Therapy

## 2023-08-30 ENCOUNTER — Ambulatory Visit (HOSPITAL_BASED_OUTPATIENT_CLINIC_OR_DEPARTMENT_OTHER): Admitting: Orthopaedic Surgery

## 2023-08-30 DIAGNOSIS — M25611 Stiffness of right shoulder, not elsewhere classified: Secondary | ICD-10-CM

## 2023-08-30 DIAGNOSIS — M7521 Bicipital tendinitis, right shoulder: Secondary | ICD-10-CM | POA: Diagnosis not present

## 2023-08-30 DIAGNOSIS — M25511 Pain in right shoulder: Secondary | ICD-10-CM

## 2023-08-30 MED ORDER — LIDOCAINE HCL 1 % IJ SOLN
4.0000 mL | INTRAMUSCULAR | Status: AC | PRN
  Administered 2023-08-30: 4 mL

## 2023-08-30 MED ORDER — TRIAMCINOLONE ACETONIDE 40 MG/ML IJ SUSP
80.0000 mg | INTRAMUSCULAR | Status: AC | PRN
  Administered 2023-08-30: 80 mg via INTRA_ARTICULAR

## 2023-08-30 NOTE — Therapy (Signed)
 OUTPATIENT PHYSICAL THERAPY SHOULDER TREATMENT    Patient Name: Tonya Suarez MRN: [id number] DOB:December 26, 1970, 53 y.o., female Today's Date: 09/01/2023  END OF SESSION:  PT End of Session - 08/31/23 1726     Visit Number 11    Number of Visits 29    Date for PT Re-Evaluation 10/04/23    Authorization Type UHC    PT Start Time 1549    PT Stop Time 1611    PT Time Calculation (min) 22 min    Activity Tolerance Patient tolerated treatment well    Behavior During Therapy WFL for tasks assessed/performed               Past Medical History:  Diagnosis Date   Abnormal Pap smear of cervix    ascus hpv hr neg   Allergy    Arthritis    Chronic headaches    Depression    Diabetes mellitus without complication (HCC)    GERD (gastroesophageal reflux disease)    Hypertension    Low back pain    UTI (urinary tract infection)    Past Surgical History:  Procedure Laterality Date   CERVICAL BIOPSY  W/ LOOP ELECTRODE EXCISION     COLPOSCOPY     CIN2/3   SHOULDER ARTHROSCOPY WITH DISTAL CLAVICLE RESECTION Right 06/12/2023   Procedure: RIGHT SHOULDER ARTHROSCOPY WITH DISTAL CLAVICLE RESECTION, SUBPECTORIAL TENODESIS;  Surgeon: Genelle Standing, MD;  Location: [facility];  Service: Orthopedics;  Laterality: Right;   SUBACROMIAL DECOMPRESSION Right 06/12/2023   Procedure: RIGHT SUBACROMIAL DECOMPRESSION;  Surgeon: Genelle Standing, MD;  Location: [facility];  Service: Orthopedics;  Laterality: Right;   TUBAL LIGATION     Patient Active Problem List   Diagnosis Date Noted   Impingement syndrome of right shoulder 06/12/2023   Arthritis of right acromioclavicular joint 06/12/2023   Biceps tendinitis of right upper extremity 06/12/2023   Hyperlipidemia associated with type 2 diabetes mellitus (HCC) 05/25/2021   Controlled diabetes mellitus (HCC) 05/13/2021   Recurrent major depressive disorder, in full remission (HCC) 05/11/2021   Bilateral sciatica  12/08/2020   Mass of thigh, left 08/26/2020   Pain in right hand 09/27/2018   Former smoker 07/19/2018   Constipation 07/19/2018   B12 deficiency 07/19/2018   Restless legs 07/19/2018   Ingrown toenail 12/20/2016   History of adenomatous polyp of colon 09/23/2016   Migraines 05/06/2016   Family history of cancer of GI tract 12/06/2012   HTN (hypertension) 11/21/2011   MENORRHAGIA 09/23/2009   PLANTAR FASCIITIS 09/20/2007   Osteoarthritis 09/19/2006   Depression 09/06/2006   Allergic rhinitis 09/06/2006   GERD 09/06/2006     REFERRING PROVIDER: Genelle Standing, MD  REFERRING DIAG: M75.41 (ICD-10-CM) - Impingement syndrome of right shoulder  S/p Arthroscopic limited debridement  Arthroscopic distal clavicle excision  Arthroscopic subacromial decompression   Arthroscopic biceps tenodesis   THERAPY DIAG:  Right shoulder pain, unspecified chronicity  Stiffness of right shoulder, not elsewhere classified  Rationale for Evaluation and Treatment: Rehabilitation  ONSET DATE: DOS  06/12/2023  SUBJECTIVE:  SUBJECTIVE STATEMENT: Pt is 11 weeks and 2 days post op.  Pain comes and goes.  Pt thought her car door was unlocked when she tried to open he door, but it was locked.  Pt had pain with pulling the door.  Pt has less pain with wearing bra and with work activities.  Pt was a little sore after prior Rx.  She used ice which helped.   Pt saw MD today and had a cortisone injection.  Pt informed MD that she had therapy right afterwards and would she be ok to go to PT.  Pt reports MD instructed her to go to her PT appt and PT would be fine.  Pt has changed her work shift now.     Hand dominance: Right  PERTINENT HISTORY: R shoulder arthroscopic biceps tenodesis, subacromial decompression, DCE, and limited  debridement on 06/12/23  DM type 2, arthritis, LBP, HTN   PAIN:  Are you having pain? Yes NPRS:  0/10 currently, 10/10 worst, 0/10 best Location: anterior and lateral R shoulder  PRECAUTIONS: Other: per surgical protocol    WEIGHT BEARING RESTRICTIONS: Yes R UE  FALLS:  Has patient fallen in last 6 months? 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    PLOF: Independent  PATIENT GOALS: to be able sleep well without shoulder pain.  To perform activities without pain  NEXT MD VISIT:  06/28/23  OBJECTIVE:  Note: Objective measures were completed at Evaluation unless otherwise noted.  DIAGNOSTIC FINDINGS:  Pt is post op.  Pt had a MRI before surgery.                                                                                                                               TREATMENT:  7/9 Reviewed pt presentation, pain levels, and response to prior Rx.  Pt received R shoulder PROM in flexion, scaption, ER, and IR per pt and tissue tolerance in supine.   7/1 Shoulder pulleys in flexion and scaption x 20 reps each  Pt received R shoulder PROM in flexion, abd, ER, and IR per pt and tissue tolerance.   R shoulder ROM: PROM:   Flexion:  139 Abd:  93 ER:  37 IR:  42  R shoulder AROM:  (supine) Flexion:  122 ER:  32  R shoulder AAROM:  128/127 Supine/standing  Supine shoulder ABC x 1 rep Standing wand flexion x 6 reps in front of mirror Standing wand scaption x10 reps in front of mirror Standing rows with RTB 2x12  PATIENT SURVEYS:  UEFI:  Initial/Current:  28/80 / 67/80   6/26   Manual:  Grade II and III inferior and posterior glides  Trigger point release to the upper trap and anterior shoulder Active release of pec  Review of self biceps  STM   There-ex:  Supine wand press 2 lbs 2x10  Supine  wand flexion 2lbs 2x10   Neuro-re-ed Supine ABC 1 lb 2x Row with band red 2x12  Shoulder extension 2x12  All with cueing to keep good posture   08/03/23 Manual: Grade II inferior glides in flexion R shoulder PROM in flexion, scaption, ER Supine AAROM with 1# bar 2 x 10 Supine wand ER 1# bar 2 x 10 reps Supine serratus punch 2# 2 x 10 Pulleys in flexion and scaption 3x10 each Wall ladder flexion 1x 10 Bent shoulder extension 1# 2 x 10 Bent row 1# 2 x 10  6/4 Pulleys in flexion and scaption 2x10 each Supine wand flexion x 10 reps Supine flexion AROM 2x10 reps Supine wand ER x 10 reps Supine ER AROM x10  Supine serratus punch 2 x 10 reps Supine shoulder ABC x 1 reps S/L shoulder Abduction with assistance from PT x 10 reps   Pt received R shoulder PROM in flexion, scaption, ER, and IR in supine per pt and tissue tolerance w/n protocol ranges    PATIENT EDUCATION: Education details:  objective findings, dx, HEP, POC, relevant anatomy, ice usage, and exercise form.  PT answered pt's questions. Person educated: Patient Education method: Explanation, Demonstration, Tactile cues, Verbal cues Education comprehension: verbalized understanding, returned demonstration, verbal cues required, tactile cues required, and needs further education  HOME EXERCISE PROGRAM: Access Code: [email or web address] URL: [email or web address] Date: 06/16/2023 Prepared by: Mose Minerva    ASSESSMENT:  CLINICAL IMPRESSION: Pt saw MD and received a cortisone injection prior to today's PT appointment.  PT limited treatment session and activities due to having a cortisone injection.  PT performed R shoulder PROM to improved shoulder mobility, ROM, and tightness today.  PT deferred any exercises.  She tolerated PROM well and responded well to Rx having no increased pain after Rx.  Pt has changed her work shift and will hopefully be able to increase consistency of PT appointments.  She will benefit  from cont skilled PT per protocol to address goals, improve ROM, and to assist in restoring desired level of function.      OBJECTIVE IMPAIRMENTS: decreased ROM, decreased strength, hypomobility, impaired flexibility, impaired UE functional use, and pain.   ACTIVITY LIMITATIONS: carrying, lifting, bathing, dressing, reach over head, and hygiene/grooming  PARTICIPATION LIMITATIONS: meal prep, cleaning, laundry, driving, shopping, community activity, and occupation  PERSONAL FACTORS: 1-2 comorbidities: DM type 2 arthritis are also affecting patient's functional outcome.   REHAB POTENTIAL: Good  CLINICAL DECISION MAKING: Stable/uncomplicated  EVALUATION COMPLEXITY: Low   GOALS:   SHORT TERM GOALS:   Pt will be independent with HEP for improved pain, ROM, and function.  Baseline: Goal status: PROGRESSING Target date:   07/12/2023  2.  Pt will wean out of sling per MD instruction without adverse effects.  Baseline:  Goal status: GOAL MET Target date:   07/12/2023  3.  Pt will progress with PROM and AAROM per protocol without adverse effects for improved shoulder mobility and function. Baseline:  Goal status: INITIAL Target date:   07/12/2023  4.  Pt will demo R shoulder PROM/AAROM to be at least 140/120 deg in flexion (supine) and 50/45 deg in ER for improved shoulder mobility and stiffness. Baseline:  Goal status: PROGRESSING   Target date:   07/26/2023   5.  Pt will demo at least 100 deg of R shoulder elevation in standing without significant UT compensatory hike.   Baseline:  Goal status: ONGOING Target  date:   08/09/2023  6.  Ptwill be able to perform her self care activities with no > than minimal difficulty.  Baseline:  Goal status: GOAL MET  7/1 Target date:   09/06/2023   LONG TERM GOALS: Target date:  10/04/2023   Pt will demo R shoulder AROM to be [facility] t/o for performance of ADLs and IADLs. Baseline:  Goal status: PROGRESSING  2.  Pt will be able to perform  her ADLs and IADLs including household chores without significant difficulty and pain.  Baseline:  Goal status: INITIAL  3.  Pt will be able to perform her normal reaching and overhead activities without significant limitation and pain.  Baseline:  Goal status: INITIAL  4.  Pt will return to work with expected limitations without adverse effects.   Baseline:  Goal status: PROGRESSING  5.  Pt will demo R shoulder strength to be at least 4+/5 MMT in flexion, ER, and IR and 4/5 strength in abduction for performance of work activities and functional lifting/carrying.   Baseline:  Goal status: INITIAL   PLAN:  PT FREQUENCY: 2x/wk   PT DURATION: 6-7 weeks  PLANNED INTERVENTIONS: 97164- PT Re-evaluation, 97750- Physical Performance Testing, 97110-Therapeutic exercises, 97530- Therapeutic activity, [id number]- Neuromuscular re-education, 97535- Self Care, 02859- Manual therapy, [id number]- Aquatic Therapy, [id number]- Electrical stimulation (unattended), [id number]- Electrical stimulation (manual), 97035- Ultrasound, Patient/Family education, Taping, Dry Needling, Joint mobilization, Spinal mobilization, Scar mobilization, Cryotherapy, and Moist heat  PLAN FOR NEXT SESSION: cont per biceps tenodesis protocol.  Leigh Minerva III PT, DPT 09/01/23 6:30 AM

## 2023-08-30 NOTE — Progress Notes (Signed)
 Post Operative Evaluation    Procedure/Date of Surgery: Right shoulder arthroscopy with distal clavicle resection and subacromial decompression  Interval History:   Presents today 2 1/61-month status post above procedure.  Overall she is having some tenderness around the biceps although her shoulder range of motion is improving nicely.  PMH/PSH/Family History/Social History/Meds/Allergies:    Past Medical History:  Diagnosis Date   Abnormal Pap smear of cervix    ascus hpv hr neg   Allergy    Arthritis    Chronic headaches    Depression    Diabetes mellitus without complication (HCC)    GERD (gastroesophageal reflux disease)    Hypertension    Low back pain    UTI (urinary tract infection)    Past Surgical History:  Procedure Laterality Date   CERVICAL BIOPSY  W/ LOOP ELECTRODE EXCISION     COLPOSCOPY     CIN2/3   SHOULDER ARTHROSCOPY WITH DISTAL CLAVICLE RESECTION Right 06/12/2023   Procedure: RIGHT SHOULDER ARTHROSCOPY WITH DISTAL CLAVICLE RESECTION, SUBPECTORIAL TENODESIS;  Surgeon: Genelle Standing, MD;  Location: [facility];  Service: Orthopedics;  Laterality: Right;   SUBACROMIAL DECOMPRESSION Right 06/12/2023   Procedure: RIGHT SUBACROMIAL DECOMPRESSION;  Surgeon: Genelle Standing, MD;  Location: [facility];  Service: Orthopedics;  Laterality: Right;   TUBAL LIGATION     Social History   Socioeconomic History   Marital status: Married    Spouse name: Not on file   Number of children: Not on file   Years of education: Not on file   Highest education level: Not on file  Occupational History   Not on file  Tobacco Use   Smoking status: Former    Types: Cigars   Smokeless tobacco: Never  Vaping Use   Vaping status: Never Used  Substance and Sexual Activity   Alcohol use: Yes    Alcohol/week: 0.0 - 2.0 standard drinks of alcohol    Comment: occ   Drug use: No   Sexual activity: Yes    Partners:  Male    Birth control/protection: Surgical, Post-menopausal    Comment: tubal ligation  Other Topics Concern   Not on file  Social History Narrative   Married May 13th 2017. 2 children- son 23 (married) and daughter 27. Granddaughter august 2017.       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      Hobbies: time with family   Social Drivers of Corporate investment banker Strain: Not on file  Food Insecurity: Not on file  Transportation Needs: Not on file  Physical Activity: Not on file  Stress: Not on file  Social Connections: Unknown (08/03/2021)   Received from [employer]   Social Network    Social Network: Not on file   Family History  Problem Relation Age of Onset   Colon polyps Mother    Diabetes Mother    Hypertension Mother    Hyperlipidemia Mother    Kidney disease Mother        has fistula- could need dialysis at any time   Colon polyps Father    Diabetes Father        mother   Hyperlipidemia Father        mother   Hypertension Father  mother   Colon cancer Father        24s, and grandmother   Lung cancer Father        in 48s   Deep vein thrombosis Father        not during dtime of cancer. also paternal aunts reported   Cancer - Other Sister        duodenal   Stomach cancer Sister    Colon cancer Paternal Grandmother    Colon cancer Maternal Aunt    Esophageal cancer Neg Hx    Liver cancer Neg Hx    Pancreatic cancer Neg Hx    Rectal cancer Neg Hx    Allergies  Allergen Reactions   Amoxicillin Swelling    Lip swelling   Nsaids     bleeding   Current Outpatient Medications  Medication Sig Dispense Refill   amLODipine  (NORVASC ) 2.5 MG tablet TAKE 1 TABLET BY MOUTH EVERY DAY 90 tablet 3   aspirin  EC 325 MG tablet Take 1 tablet (325 mg total) by mouth daily. 14 tablet 0   azelastine  (ASTELIN ) 0.1 % nasal spray Place 2 sprays into both nostrils 2 (two) times daily. 90 mL 3   clotrimazole -betamethasone   (LOTRISONE ) cream APPLY TO AFFECTED AREA TWICE A DAY 45 g 1   Continuous Blood Gluc Sensor (FREESTYLE LIBRE 3 SENSOR) MISC Quantity: 2 sensors/month NDC# [phone] Sensor Refills: PRN or 12 refills annually 2 each 12   diclofenac  Sodium (VOLTAREN ) 1 % GEL Apply 4 g topically 4 (four) times daily. To affected joint. 100 g 11   doxycycline  (VIBRA -TABS) 100 MG tablet Take 1 tablet (100 mg total) by mouth 2 (two) times daily. 20 tablet 1   fluticasone  (FLONASE ) 50 MCG/ACT nasal spray SPRAY 2 SPRAYS INTO EACH NOSTRIL EVERY DAY 48 mL 3   gabapentin  (NEURONTIN ) 300 MG capsule TAKE 2 CAPSULES BY MOUTH AT BEDTIME 180 capsule 1   hydrochlorothiazide  (HYDRODIURIL ) 25 MG tablet TAKE 1 TABLET (25 MG TOTAL) BY MOUTH DAILY. 90 tablet 3   ipratropium (ATROVENT ) 0.03 % nasal spray PLACE 2 SPRAYS INTO BOTH NOSTRILS EVERY 12 (TWELVE) HOURS. 90 mL 2   meloxicam  (MOBIC ) 7.5 MG tablet TAKE 1 TABLET (7.5 MG TOTAL) BY MOUTH DAILY AS NEEDED. FOR PAIN 90 tablet 1   oxyCODONE  (ROXICODONE ) 5 MG immediate release tablet Take 1 tablet (5 mg total) by mouth every 4 (four) hours as needed for severe pain (pain score 7-10) or breakthrough pain. 15 tablet 0   pantoprazole  (PROTONIX ) 40 MG tablet TAKE 1 TABLET BY MOUTH EVERY DAY 90 tablet 3   rosuvastatin  (CRESTOR ) 10 MG tablet TAKE 1 TABLET (10 MG TOTAL) BY MOUTH ONCE A WEEK. 13 tablet 3   Semaglutide ,0.25 or 0.5MG /DOS, (OZEMPIC , 0.25 OR 0.5 MG/DOSE,) 2 MG/3ML SOPN INJECT 0.25 MG AS DIRECTED ONCE A WEEK FOR 28 DAYS, THEN 0.5 MG ONCE A WEEK FOR 28 DAYS 3 mL 5   tiZANidine  (ZANAFLEX ) 4 MG tablet TAKE 0.5-1 TABLET (2-4 MG TOTAL) BY MOUTH EVERY 8 (EIGHT) HOURS AS NEEDED FOR MUSCLE SPASMS. 30 tablet 2   No current facility-administered medications for this visit.   No results found.  Review of Systems:   A ROS was performed including pertinent positives and negatives as documented in the HPI.   Musculoskeletal Exam:    Right shoulder incisions are well-appearing without  erythema or drainage.  There is a small rash about the anterior aspect of the elbow.  In the spine position she can forward elevate to 90  degrees actively with external rotation at side to 30.  Distal neurosensory exam is intact   Left hip with a positive FADIR with tenderness about the femoral acetabular joint.  30 degrees internal rotation and 90 degrees of flexion with pain 45 degrees external rotation without pain.  Good abduction strength  Imaging:      I personally reviewed and interpreted the radiographs.   Assessment:   2-1/2 months status post right shoulder distal clavicle resection subacromial decompression overall doing well.  Range of motion is improved although today's visit she is having some mild biceps tendinitis.  Given this I did recommend an ultrasound-guided injection of the biceps.  Will plan to proceed with this today  Plan :    - Return to clinic 6 weeks for likely final check    Procedure Note  Patient: Tonya Suarez             Date of Birth: 27-Sep-1970           MRN: [id number]             Visit Date: 08/30/2023  Procedures: Visit Diagnoses:  No diagnosis found.   Large Joint Inj: R glenohumeral on 08/30/2023 7:46 PM Indications: pain Details: 22 G 1.5 in needle, ultrasound-guided anterior approach  Arthrogram: No  Medications: 4 mL lidocaine  1 %; 80 mg triamcinolone  acetonide 40 MG/ML Outcome: tolerated well, no immediate complications Procedure, treatment alternatives, risks and benefits explained, specific risks discussed. Consent was given by the patient. Immediately prior to procedure a time out was called to verify the correct patient, procedure, equipment, support staff and site/side marked as required. Patient was prepped and draped in the usual sterile fashion.            I personally saw and evaluated the patient, and participated in the management and treatment plan.  Elspeth Parker, MD Attending Physician, Orthopedic  Surgery  This document was dictated using Dragon voice recognition software. A reasonable attempt at proof reading has been made to minimize errors.

## 2023-08-31 ENCOUNTER — Encounter (HOSPITAL_BASED_OUTPATIENT_CLINIC_OR_DEPARTMENT_OTHER): Payer: Self-pay | Admitting: Physical Therapy

## 2023-09-01 ENCOUNTER — Encounter (HOSPITAL_BASED_OUTPATIENT_CLINIC_OR_DEPARTMENT_OTHER): Admitting: Physical Therapy

## 2023-09-04 ENCOUNTER — Ambulatory Visit

## 2023-09-07 ENCOUNTER — Encounter (HOSPITAL_BASED_OUTPATIENT_CLINIC_OR_DEPARTMENT_OTHER): Payer: Self-pay

## 2023-09-07 ENCOUNTER — Ambulatory Visit (HOSPITAL_BASED_OUTPATIENT_CLINIC_OR_DEPARTMENT_OTHER)

## 2023-09-07 DIAGNOSIS — M25611 Stiffness of right shoulder, not elsewhere classified: Secondary | ICD-10-CM

## 2023-09-07 DIAGNOSIS — M6281 Muscle weakness (generalized): Secondary | ICD-10-CM

## 2023-09-07 DIAGNOSIS — M25511 Pain in right shoulder: Secondary | ICD-10-CM | POA: Diagnosis not present

## 2023-09-07 NOTE — Therapy (Signed)
 OUTPATIENT PHYSICAL THERAPY SHOULDER TREATMENT    Patient Name: Tonya Suarez MRN: [id number] DOB:[birth year], 53 y.o., female Today's Date: 09/07/2023  END OF SESSION:  PT End of Session - 09/07/23 1631     Visit Number 12    Number of Visits 29    Date for PT Re-Evaluation 10/04/23    Authorization Type [employer]    PT Start Time 1611    PT Stop Time 1649    PT Time Calculation (min) 38 min    Activity Tolerance Patient tolerated treatment well    Behavior During Therapy WFL for tasks assessed/performed                Past Medical History:  Diagnosis Date   Abnormal Pap smear of cervix    ascus hpv hr neg   Allergy    Arthritis    Chronic headaches    Depression    Diabetes mellitus without complication (HCC)    GERD (gastroesophageal reflux disease)    Hypertension    Low back pain    UTI (urinary tract infection)    Past Surgical History:  Procedure Laterality Date   CERVICAL BIOPSY  W/ LOOP ELECTRODE EXCISION     COLPOSCOPY     CIN2/3   SHOULDER ARTHROSCOPY WITH DISTAL CLAVICLE RESECTION Right 06/12/2023   Procedure: RIGHT SHOULDER ARTHROSCOPY WITH DISTAL CLAVICLE RESECTION, SUBPECTORIAL TENODESIS;  Surgeon: Genelle Standing, MD;  Location: [facility];  Service: Orthopedics;  Laterality: Right;   SUBACROMIAL DECOMPRESSION Right 06/12/2023   Procedure: RIGHT SUBACROMIAL DECOMPRESSION;  Surgeon: Genelle Standing, MD;  Location: [facility];  Service: Orthopedics;  Laterality: Right;   TUBAL LIGATION     Patient Active Problem List   Diagnosis Date Noted   Impingement syndrome of right shoulder 06/12/2023   Arthritis of right acromioclavicular joint 06/12/2023   Biceps tendinitis of right upper extremity 06/12/2023   Hyperlipidemia associated with type 2 diabetes mellitus (HCC) 05/25/2021   Controlled diabetes mellitus (HCC) 05/13/2021   Recurrent major depressive disorder, in full remission (HCC) 05/11/2021   Bilateral  sciatica 12/08/2020   Mass of thigh, left 08/26/2020   Pain in right hand 09/27/2018   Former smoker 07/19/2018   Constipation 07/19/2018   B12 deficiency 07/19/2018   Restless legs 07/19/2018   Ingrown toenail 12/20/2016   History of adenomatous polyp of colon 09/23/2016   Migraines 05/06/2016   Family history of cancer of GI tract 12/06/2012   HTN (hypertension) 11/21/2011   MENORRHAGIA 09/23/2009   PLANTAR FASCIITIS 09/20/2007   Osteoarthritis 09/19/2006   Depression 09/06/2006   Allergic rhinitis 09/06/2006   GERD 09/06/2006     REFERRING PROVIDER: Genelle Standing, MD  REFERRING DIAG: M75.41 (ICD-10-CM) - Impingement syndrome of right shoulder  S/p Arthroscopic limited debridement  Arthroscopic distal clavicle excision  Arthroscopic subacromial decompression   Arthroscopic biceps tenodesis   THERAPY DIAG:  Right shoulder pain, unspecified chronicity  Stiffness of right shoulder, not elsewhere classified  Muscle weakness (generalized)  Rationale for Evaluation and Treatment: Rehabilitation  ONSET DATE: DOS  06/12/2023  SUBJECTIVE:  SUBJECTIVE STATEMENT: Unable to tell improvement in shoulder pain since injection last week. Pt reports ongoing pain in shoulder since trying to pull car door open on Tuesday and it didn't open. Felt really painful. Repots 6/10 pain level at entry.     Hand dominance: Right  PERTINENT HISTORY: R shoulder arthroscopic biceps tenodesis, subacromial decompression, DCE, and limited debridement on 06/12/23  DM type 2, arthritis, LBP, HTN   PAIN:  Are you having pain? Yes NPRS:  0/10 currently, 10/10 worst, 0/10 best Location: anterior and lateral R shoulder  PRECAUTIONS: Other: per surgical protocol    WEIGHT BEARING RESTRICTIONS: Yes R UE  FALLS:   Has patient fallen in last 6 months? 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    PLOF: Independent  PATIENT GOALS: to be able sleep well without shoulder pain.  To perform activities without pain  NEXT MD VISIT:  06/28/23  OBJECTIVE:  Note: Objective measures were completed at Evaluation unless otherwise noted.  DIAGNOSTIC FINDINGS:  Pt is post op.  Pt had a MRI before surgery.                                                                                                                               TREATMENT:   7/17 PROM R shoulder STM biceps Supine flexion with 2# bar 2x10 Supine chest press 2# bar 2x10 Supine ABC x1 Theraband row GTB 2x15 Pulleys flexion and scaption Standing active flexion x10 Standing active abduction x10 Seated press out 2x10    7/9 Reviewed pt presentation, pain levels, and response to prior Rx.  Pt received R shoulder PROM in flexion, scaption, ER, and IR per pt and tissue tolerance in supine.   7/1 Shoulder pulleys in flexion and scaption x 20 reps each  Pt received R shoulder PROM in flexion, abd, ER, and IR per pt and tissue tolerance.   R shoulder ROM: PROM:   Flexion:  139 Abd:  93 ER:  37 IR:  42  R shoulder AROM:  (supine) Flexion:  122 ER:  32  R shoulder AAROM:  128/127 Supine/standing  Supine shoulder ABC x 1 rep Standing wand flexion x 6 reps in front of mirror Standing wand scaption x10 reps in front of mirror Standing rows with RTB 2x12  PATIENT SURVEYS:  UEFI:  Initial/Current:  28/80 / 67/80   6/26   Manual:  Grade II and III inferior and posterior glides  Trigger point release to the upper trap and anterior shoulder Active release of pec  Review of self biceps STM   There-ex:  Supine wand press 2 lbs 2x10  Supine wand flexion 2lbs  2x10   Neuro-re-ed Supine ABC 1 lb 2x Row with band red 2x12  Shoulder extension 2x12  All with  cueing to keep good posture   08/03/23 Manual: Grade II inferior glides in flexion R shoulder PROM in flexion, scaption, ER Supine AAROM with 1# bar 2 x 10 Supine wand ER 1# bar 2 x 10 reps Supine serratus punch 2# 2 x 10 Pulleys in flexion and scaption 3x10 each Wall ladder flexion 1x 10 Bent shoulder extension 1# 2 x 10 Bent row 1# 2 x 10  6/4 Pulleys in flexion and scaption 2x10 each Supine wand flexion x 10 reps Supine flexion AROM 2x10 reps Supine wand ER x 10 reps Supine ER AROM x10  Supine serratus punch 2 x 10 reps Supine shoulder ABC x 1 reps S/L shoulder Abduction with assistance from PT x 10 reps   Pt received R shoulder PROM in flexion, scaption, ER, and IR in supine per pt and tissue tolerance w/n protocol ranges    PATIENT EDUCATION: Education details:  objective findings, dx, HEP, POC, relevant anatomy, ice usage, and exercise form.  PT answered pt's questions. Person educated: Patient Education method: Explanation, Demonstration, Tactile cues, Verbal cues Education comprehension: verbalized understanding, returned demonstration, verbal cues required, tactile cues required, and needs further education  HOME EXERCISE PROGRAM: Access Code: [email or web address] URL: [email or web address] Date: 06/16/2023 Prepared by: Mose Minerva    ASSESSMENT:  CLINICAL IMPRESSION: Pt able to tolerate gentle AAROM and AROM today. With passive ROM, she did report pain just over 90deg flexion and abd. Only minimal passive IR and ER available passively pain free. Gentle STM performed to biceps to decrease tightness. Performed pulleys with overall good tolerance. Instructed to continue with use of ice at home. Will monitor pain level and progress as tolerated.     OBJECTIVE IMPAIRMENTS: decreased ROM, decreased strength, hypomobility, impaired flexibility, impaired UE  functional use, and pain.   ACTIVITY LIMITATIONS: carrying, lifting, bathing, dressing, reach over head, and hygiene/grooming  PARTICIPATION LIMITATIONS: meal prep, cleaning, laundry, driving, shopping, community activity, and occupation  PERSONAL FACTORS: 1-2 comorbidities: DM type 2 arthritis are also affecting patient's functional outcome.   REHAB POTENTIAL: Good  CLINICAL DECISION MAKING: Stable/uncomplicated  EVALUATION COMPLEXITY: Low   GOALS:   SHORT TERM GOALS:   Pt will be independent with HEP for improved pain, ROM, and function.  Baseline: Goal status: PROGRESSING Target date:   07/12/2023  2.  Pt will wean out of sling per MD instruction without adverse effects.  Baseline:  Goal status: GOAL MET Target date:   07/12/2023  3.  Pt will progress with PROM and AAROM per protocol without adverse effects for improved shoulder mobility and function. Baseline:  Goal status: INITIAL Target date:   07/12/2023  4.  Pt will demo R shoulder PROM/AAROM to be at least 140/120 deg in flexion (supine) and 50/45 deg in ER for improved shoulder mobility and stiffness. Baseline:  Goal status: PROGRESSING   Target date:   07/26/2023   5.  Pt will demo at least 100 deg of R shoulder elevation in standing without significant UT compensatory hike.   Baseline:  Goal status: ONGOING Target date:   08/09/2023  6.  Ptwill be able to perform her self care activities with no > than minimal difficulty.  Baseline:  Goal status: GOAL MET  7/1 Target date:   09/06/2023   LONG TERM GOALS: Target date:  10/04/2023   Pt will demo R shoulder AROM to be [facility] t/o for performance of ADLs and IADLs. Baseline:  Goal status: PROGRESSING  2.  Pt will be able to perform her ADLs  and IADLs including household chores without significant difficulty and pain.  Baseline:  Goal status: INITIAL  3.  Pt will be able to perform her normal reaching and overhead activities without significant limitation and  pain.  Baseline:  Goal status: INITIAL  4.  Pt will return to work with expected limitations without adverse effects.   Baseline:  Goal status: PROGRESSING  5.  Pt will demo R shoulder strength to be at least 4+/5 MMT in flexion, ER, and IR and 4/5 strength in abduction for performance of work activities and functional lifting/carrying.   Baseline:  Goal status: INITIAL   PLAN:  PT FREQUENCY: 2x/wk   PT DURATION: 6-7 weeks  PLANNED INTERVENTIONS: [id number]- PT Re-evaluation, 97750- Physical Performance Testing, 97110-Therapeutic exercises, 97530- Therapeutic activity, [id number]- Neuromuscular re-education, 97535- Self Care, [id number]- Manual therapy, [id number]- Aquatic Therapy, [id number]- Electrical stimulation (unattended), [id number]- Electrical stimulation (manual), 97035- Ultrasound, Patient/Family education, Taping, Dry Needling, Joint mobilization, Spinal mobilization, Scar mobilization, Cryotherapy, and Moist heat  PLAN FOR NEXT SESSION: cont per biceps tenodesis protocol.  Asberry Rodes, PTA  09/07/23 5:11 PM

## 2023-09-12 ENCOUNTER — Encounter (HOSPITAL_BASED_OUTPATIENT_CLINIC_OR_DEPARTMENT_OTHER): Payer: Self-pay | Admitting: Physical Therapy

## 2023-09-12 ENCOUNTER — Ambulatory Visit (HOSPITAL_BASED_OUTPATIENT_CLINIC_OR_DEPARTMENT_OTHER): Admitting: Physical Therapy

## 2023-09-12 DIAGNOSIS — M6281 Muscle weakness (generalized): Secondary | ICD-10-CM

## 2023-09-12 DIAGNOSIS — M25511 Pain in right shoulder: Secondary | ICD-10-CM

## 2023-09-12 DIAGNOSIS — M25611 Stiffness of right shoulder, not elsewhere classified: Secondary | ICD-10-CM

## 2023-09-12 NOTE — Therapy (Signed)
 OUTPATIENT PHYSICAL THERAPY SHOULDER TREATMENT    Patient Name: Tonya Suarez MRN: [id number] DOB:1970-03-01, 53 y.o., female Today's Date: 09/13/2023  END OF SESSION:  PT End of Session - 09/12/23 1643     Visit Number 13    Number of Visits 29    Date for PT Re-Evaluation 10/04/23    Authorization Type [employer]    PT Start Time 1634    PT Stop Time 1718    PT Time Calculation (min) 44 min    Activity Tolerance Patient tolerated treatment well    Behavior During Therapy WFL for tasks assessed/performed                Past Medical History:  Diagnosis Date   Abnormal Pap smear of cervix    ascus hpv hr neg   Allergy    Arthritis    Chronic headaches    Depression    Diabetes mellitus without complication (HCC)    GERD (gastroesophageal reflux disease)    Hypertension    Low back pain    UTI (urinary tract infection)    Past Surgical History:  Procedure Laterality Date   CERVICAL BIOPSY  W/ LOOP ELECTRODE EXCISION     COLPOSCOPY     CIN2/3   SHOULDER ARTHROSCOPY WITH DISTAL CLAVICLE RESECTION Right 06/12/2023   Procedure: RIGHT SHOULDER ARTHROSCOPY WITH DISTAL CLAVICLE RESECTION, SUBPECTORIAL TENODESIS;  Surgeon: Genelle Standing, MD;  Location: [facility];  Service: Orthopedics;  Laterality: Right;   SUBACROMIAL DECOMPRESSION Right 06/12/2023   Procedure: RIGHT SUBACROMIAL DECOMPRESSION;  Surgeon: Genelle Standing, MD;  Location: [facility];  Service: Orthopedics;  Laterality: Right;   TUBAL LIGATION     Patient Active Problem List   Diagnosis Date Noted   Impingement syndrome of right shoulder 06/12/2023   Arthritis of right acromioclavicular joint 06/12/2023   Biceps tendinitis of right upper extremity 06/12/2023   Hyperlipidemia associated with type 2 diabetes mellitus (HCC) 05/25/2021   Controlled diabetes mellitus (HCC) 05/13/2021   Recurrent major depressive disorder, in full remission (HCC) 05/11/2021   Bilateral  sciatica 12/08/2020   Mass of thigh, left 08/26/2020   Pain in right hand 09/27/2018   Former smoker 07/19/2018   Constipation 07/19/2018   B12 deficiency 07/19/2018   Restless legs 07/19/2018   Ingrown toenail 12/20/2016   History of adenomatous polyp of colon 09/23/2016   Migraines 05/06/2016   Family history of cancer of GI tract 12/06/2012   HTN (hypertension) 11/21/2011   MENORRHAGIA 09/23/2009   PLANTAR FASCIITIS 09/20/2007   Osteoarthritis 09/19/2006   Depression 09/06/2006   Allergic rhinitis 09/06/2006   GERD 09/06/2006     REFERRING PROVIDER: Genelle Standing, MD  REFERRING DIAG: M75.41 (ICD-10-CM) - Impingement syndrome of right shoulder  S/p Arthroscopic limited debridement  Arthroscopic distal clavicle excision  Arthroscopic subacromial decompression   Arthroscopic biceps tenodesis   THERAPY DIAG:  Right shoulder pain, unspecified chronicity  Stiffness of right shoulder, not elsewhere classified  Muscle weakness (generalized)  Rationale for Evaluation and Treatment: Rehabilitation  ONSET DATE: DOS  06/12/2023  SUBJECTIVE:  SUBJECTIVE STATEMENT: Pt is 13 weeks and 1 day post op.  Pt reports her range of motion is better than what it was.  Pain is not as bad as it was.  Pt states she felt good after prior Rx.      Hand dominance: Right  PERTINENT HISTORY: R shoulder arthroscopic biceps tenodesis, subacromial decompression, DCE, and limited debridement on 06/12/23  DM type 2, arthritis, LBP, HTN   PAIN:  Are you having pain? Yes NPRS:  0/10 currently, 10/10 worst, 0/10 best Location: anterior and lateral R shoulder  PRECAUTIONS: Other: per surgical protocol    WEIGHT BEARING RESTRICTIONS: Yes R UE  FALLS:  Has patient fallen in last 6 months? 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    PLOF: Independent  PATIENT GOALS: to be able sleep well without shoulder pain.  To perform activities without pain  NEXT MD VISIT:  06/28/23  OBJECTIVE:  Note: Objective measures were completed at Evaluation unless otherwise noted.  DIAGNOSTIC FINDINGS:  Pt is post op.  Pt had a MRI before surgery.                                                                                                                               TREATMENT:   7/22 Pulleys in flexion, scaption, and abd 2x10 each  Pt received R shoulder PROM in flexion, scaption, abd, ER, and IR in supine per pt and tissue tolerance.   Supine flexion with 2# bar 2x10 Supine shoulder ABC x 1 rep with 1# Supine serratus punches AROM x 12, 2# 2x12 Prone row 1# x 12, 2# x12 Standing wand flexion x10 in front of mirror Standing and scaption x10 in front of mirror Standing wand ER x10 Standing wand IR x 10  7/17 PROM R shoulder STM biceps Supine flexion with 2# bar 2x10 Supine chest press 2# bar 2x10 Supine ABC x1 Theraband row GTB 2x15 Pulleys flexion and scaption Standing active flexion x10 Standing active abduction x10 Seated press out 2x10    7/9 Reviewed pt presentation, pain levels, and response to prior Rx.  Pt received R shoulder PROM in flexion, scaption, ER, and IR per pt and tissue tolerance in supine.   7/1 Shoulder pulleys in flexion and scaption x 20 reps each  Pt received R shoulder PROM in flexion, abd, ER, and IR per pt and tissue tolerance.   R shoulder ROM: PROM:   Flexion:  139 Abd:  93 ER:  37 IR:  42  R shoulder AROM:  (supine) Flexion:  122 ER:  32  R shoulder AAROM:  128/127 Supine/standing  Supine shoulder ABC x 1 rep Standing wand flexion x 6 reps in front of  mirror Standing wand scaption x10 reps in front of mirror  Standing rows with RTB 2x12  PATIENT SURVEYS:  UEFI:  Initial/Current:  28/80 / 67/80   6/26   Manual:  Grade II and III inferior and posterior glides  Trigger point release to the upper trap and anterior shoulder Active release of pec  Review of self biceps STM   There-ex:  Supine wand press 2 lbs 2x10  Supine wand flexion 2lbs 2x10   Neuro-re-ed Supine ABC 1 lb 2x Row with band red 2x12  Shoulder extension 2x12  All with cueing to keep good posture   08/03/23 Manual: Grade II inferior glides in flexion R shoulder PROM in flexion, scaption, ER Supine AAROM with 1# bar 2 x 10 Supine wand ER 1# bar 2 x 10 reps Supine serratus punch 2# 2 x 10 Pulleys in flexion and scaption 3x10 each Wall ladder flexion 1x 10 Bent shoulder extension 1# 2 x 10 Bent row 1# 2 x 10  6/4 Pulleys in flexion and scaption 2x10 each Supine wand flexion x 10 reps Supine flexion AROM 2x10 reps Supine wand ER x 10 reps Supine ER AROM x10  Supine serratus punch 2 x 10 reps Supine shoulder ABC x 1 reps S/L shoulder Abduction with assistance from PT x 10 reps   Pt received R shoulder PROM in flexion, scaption, ER, and IR in supine per pt and tissue tolerance w/n protocol ranges    PATIENT EDUCATION: Education details:  objective findings, dx, HEP, POC, relevant anatomy, ice usage, and exercise form.  PT answered pt's questions. Person educated: Patient Education method: Explanation, Demonstration, Tactile cues, Verbal cues Education comprehension: verbalized understanding, returned demonstration, verbal cues required, tactile cues required, and needs further education  HOME EXERCISE PROGRAM: Access Code: [email or web address] URL: [email or web address] Date: 06/16/2023 Prepared by: Mose Minerva    ASSESSMENT:  CLINICAL IMPRESSION: Pt continues to have tightness and limitations in shoulder ROM especially ER and IR.  She  tolerated PROM well.  Pt is progressing with protocol.  Pt performed light strengthening for scapular exercises.  Pt performed standing wand exercises and performed them in front of mirror for visual cuing for correct form including decreasing shoulder hike compensation.  Pt responded well to Rx having no pain after Rx.  Pt should benefit from cont skilled PT per protocol to improve ROM and strength, reduce pain, address goals, and to assist with returning to desired level of function.     OBJECTIVE IMPAIRMENTS: decreased ROM, decreased strength, hypomobility, impaired flexibility, impaired UE functional use, and pain.   ACTIVITY LIMITATIONS: carrying, lifting, bathing, dressing, reach over head, and hygiene/grooming  PARTICIPATION LIMITATIONS: meal prep, cleaning, laundry, driving, shopping, community activity, and occupation  PERSONAL FACTORS: 1-2 comorbidities: DM type 2 arthritis are also affecting patient's functional outcome.   REHAB POTENTIAL: Good  CLINICAL DECISION MAKING: Stable/uncomplicated  EVALUATION COMPLEXITY: Low   GOALS:   SHORT TERM GOALS:   Pt will be independent with HEP for improved pain, ROM, and function.  Baseline: Goal status: PROGRESSING Target date:   07/12/2023  2.  Pt will wean out of sling per MD instruction without adverse effects.  Baseline:  Goal status: GOAL MET Target date:   07/12/2023  3.  Pt will progress with PROM and AAROM per protocol without adverse effects for improved shoulder mobility and function. Baseline:  Goal status: INITIAL Target date:   07/12/2023  4.  Pt will demo R shoulder PROM/AAROM to be at least 140/120 deg in flexion (supine) and 50/45 deg in ER for improved shoulder mobility and  stiffness. Baseline:  Goal status: PROGRESSING   Target date:   07/26/2023   5.  Pt will demo at least 100 deg of R shoulder elevation in standing without significant UT compensatory hike.   Baseline:  Goal status: ONGOING Target date:    08/09/2023  6.  Ptwill be able to perform her self care activities with no > than minimal difficulty.  Baseline:  Goal status: GOAL MET  7/1 Target date:   09/06/2023   LONG TERM GOALS: Target date:  10/04/2023   Pt will demo R shoulder AROM to be [facility] t/o for performance of ADLs and IADLs. Baseline:  Goal status: PROGRESSING  2.  Pt will be able to perform her ADLs and IADLs including household chores without significant difficulty and pain.  Baseline:  Goal status: INITIAL  3.  Pt will be able to perform her normal reaching and overhead activities without significant limitation and pain.  Baseline:  Goal status: INITIAL  4.  Pt will return to work with expected limitations without adverse effects.   Baseline:  Goal status: PROGRESSING  5.  Pt will demo R shoulder strength to be at least 4+/5 MMT in flexion, ER, and IR and 4/5 strength in abduction for performance of work activities and functional lifting/carrying.   Baseline:  Goal status: INITIAL   PLAN:  PT FREQUENCY: 2x/wk   PT DURATION: 6-7 weeks  PLANNED INTERVENTIONS: 97164- PT Re-evaluation, 97750- Physical Performance Testing, 97110-Therapeutic exercises, 97530- Therapeutic activity, [id number]- Neuromuscular re-education, 97535- Self Care, [id number]- Manual therapy, [id number]- Aquatic Therapy, [id number]- Electrical stimulation (unattended), [id number]- Electrical stimulation (manual), 97035- Ultrasound, Patient/Family education, Taping, Dry Needling, Joint mobilization, Spinal mobilization, Scar mobilization, Cryotherapy, and Moist heat  PLAN FOR NEXT SESSION: cont per biceps tenodesis protocol.  Leigh Minerva III PT, DPT 09/13/23 8:46 PM

## 2023-09-13 ENCOUNTER — Encounter (HOSPITAL_BASED_OUTPATIENT_CLINIC_OR_DEPARTMENT_OTHER): Payer: Self-pay | Admitting: Physical Therapy

## 2023-09-13 ENCOUNTER — Ambulatory Visit
Admission: RE | Admit: 2023-09-13 | Discharge: 2023-09-13 | Disposition: A | Discharge status: Home / Self Care | Source: Ambulatory Visit | Attending: Family Medicine | Admitting: Family Medicine

## 2023-09-13 DIAGNOSIS — Z1231 Encounter for screening mammogram for malignant neoplasm of breast: Secondary | ICD-10-CM

## 2023-09-13 NOTE — Therapy (Incomplete)
 OUTPATIENT PHYSICAL THERAPY SHOULDER TREATMENT    Patient Name: Tonya Suarez MRN: [id number] DOB:October 02, 1970, 53 y.o., female Today's Date: 09/13/2023  END OF SESSION:          Past Medical History:  Diagnosis Date   Abnormal Pap smear of cervix    ascus hpv hr neg   Allergy    Arthritis    Chronic headaches    Depression    Diabetes mellitus without complication (HCC)    GERD (gastroesophageal reflux disease)    Hypertension    Low back pain    UTI (urinary tract infection)    Past Surgical History:  Procedure Laterality Date   CERVICAL BIOPSY  W/ LOOP ELECTRODE EXCISION     COLPOSCOPY     CIN2/3   SHOULDER ARTHROSCOPY WITH DISTAL CLAVICLE RESECTION Right 06/12/2023   Procedure: RIGHT SHOULDER ARTHROSCOPY WITH DISTAL CLAVICLE RESECTION, SUBPECTORIAL TENODESIS;  Surgeon: Genelle Standing, MD;  Location: [facility];  Service: Orthopedics;  Laterality: Right;   SUBACROMIAL DECOMPRESSION Right 06/12/2023   Procedure: RIGHT SUBACROMIAL DECOMPRESSION;  Surgeon: Genelle Standing, MD;  Location: [facility];  Service: Orthopedics;  Laterality: Right;   TUBAL LIGATION     Patient Active Problem List   Diagnosis Date Noted   Impingement syndrome of right shoulder 06/12/2023   Arthritis of right acromioclavicular joint 06/12/2023   Biceps tendinitis of right upper extremity 06/12/2023   Hyperlipidemia associated with type 2 diabetes mellitus (HCC) 05/25/2021   Controlled diabetes mellitus (HCC) 05/13/2021   Recurrent major depressive disorder, in full remission (HCC) 05/11/2021   Bilateral sciatica 12/08/2020   Mass of thigh, left 08/26/2020   Pain in right hand 09/27/2018   Former smoker 07/19/2018   Constipation 07/19/2018   B12 deficiency 07/19/2018   Restless legs 07/19/2018   Ingrown toenail 12/20/2016   History of adenomatous polyp of colon 09/23/2016   Migraines 05/06/2016   Family history of cancer of GI tract 12/06/2012    HTN (hypertension) 11/21/2011   MENORRHAGIA 09/23/2009   PLANTAR FASCIITIS 09/20/2007   Osteoarthritis 09/19/2006   Depression 09/06/2006   Allergic rhinitis 09/06/2006   GERD 09/06/2006     REFERRING PROVIDER: Genelle Standing, MD  REFERRING DIAG: M75.41 (ICD-10-CM) - Impingement syndrome of right shoulder  S/p Arthroscopic limited debridement  Arthroscopic distal clavicle excision  Arthroscopic subacromial decompression   Arthroscopic biceps tenodesis   THERAPY DIAG:  No diagnosis found.  Rationale for Evaluation and Treatment: Rehabilitation  ONSET DATE: DOS  06/12/2023  SUBJECTIVE:  SUBJECTIVE STATEMENT: Pt is 13 weeks and 3 days post op.  Pt reports her range of motion is better than what it was.  Pain is not as bad as it was.  Pt states she felt good after prior Rx.      Hand dominance: Right  PERTINENT HISTORY: R shoulder arthroscopic biceps tenodesis, subacromial decompression, DCE, and limited debridement on 06/12/23  DM type 2, arthritis, LBP, HTN   PAIN:  Are you having pain? Yes NPRS:  0/10 currently, 10/10 worst, 0/10 best Location: anterior and lateral R shoulder  PRECAUTIONS: Other: per surgical protocol    WEIGHT BEARING RESTRICTIONS: Yes R UE  FALLS:  Has patient fallen in last 6 months? 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    PLOF: Independent  PATIENT GOALS: to be able sleep well without shoulder pain.  To perform activities without pain  NEXT MD VISIT:  06/28/23  OBJECTIVE:  Note: Objective measures were completed at Evaluation unless otherwise noted.  DIAGNOSTIC FINDINGS:  Pt is post op.  Pt had  a MRI before surgery.                                                                                                                               TREATMENT:   7/22 Pulleys in flexion, scaption, and abd 2x10 each  Pt received R shoulder PROM in flexion, scaption, abd, ER, and IR in supine per pt and tissue tolerance.   Supine flexion with 2# bar 2x10 Supine shoulder ABC x 1 rep with 1# Supine serratus punches AROM x 12, 2# 2x12 Prone row 1# x 12, 2# x12 Standing wand flexion x10 in front of mirror Standing and scaption x10 in front of mirror Standing wand ER x10 Standing wand IR x 10  7/17 PROM R shoulder STM biceps Supine flexion with 2# bar 2x10 Supine chest press 2# bar 2x10 Supine ABC x1 Theraband row GTB 2x15 Pulleys flexion and scaption Standing active flexion x10 Standing active abduction x10 Seated press out 2x10    7/9 Reviewed pt presentation, pain levels, and response to prior Rx.  Pt received R shoulder PROM in flexion, scaption, ER, and IR per pt and tissue tolerance in supine.   7/1 Shoulder pulleys in flexion and scaption x 20 reps each  Pt received R shoulder PROM in flexion, abd, ER, and IR per pt and tissue tolerance.   R shoulder ROM: PROM:   Flexion:  139 Abd:  93 ER:  37 IR:  42  R shoulder AROM:  (supine) Flexion:  122 ER:  32  R shoulder AAROM:  128/127 Supine/standing  Supine shoulder ABC x 1 rep Standing wand flexion x 6 reps in front of mirror Standing wand scaption x10 reps in front of mirror Standing  rows with RTB 2x12  PATIENT SURVEYS:  UEFI:  Initial/Current:  28/80 / 67/80   6/26   Manual:  Grade II and III inferior and posterior glides  Trigger point release to the upper trap and anterior shoulder Active release of pec  Review of self biceps STM   There-ex:  Supine wand press 2 lbs 2x10  Supine wand flexion 2lbs 2x10   Neuro-re-ed Supine ABC 1 lb 2x Row with band red 2x12  Shoulder extension  2x12  All with cueing to keep good posture   08/03/23 Manual: Grade II inferior glides in flexion R shoulder PROM in flexion, scaption, ER Supine AAROM with 1# bar 2 x 10 Supine wand ER 1# bar 2 x 10 reps Supine serratus punch 2# 2 x 10 Pulleys in flexion and scaption 3x10 each Wall ladder flexion 1x 10 Bent shoulder extension 1# 2 x 10 Bent row 1# 2 x 10  6/4 Pulleys in flexion and scaption 2x10 each Supine wand flexion x 10 reps Supine flexion AROM 2x10 reps Supine wand ER x 10 reps Supine ER AROM x10  Supine serratus punch 2 x 10 reps Supine shoulder ABC x 1 reps S/L shoulder Abduction with assistance from PT x 10 reps   Pt received R shoulder PROM in flexion, scaption, ER, and IR in supine per pt and tissue tolerance w/n protocol ranges    PATIENT EDUCATION: Education details:  objective findings, dx, HEP, POC, relevant anatomy, ice usage, and exercise form.  PT answered pt's questions. Person educated: Patient Education method: Explanation, Demonstration, Tactile cues, Verbal cues Education comprehension: verbalized understanding, returned demonstration, verbal cues required, tactile cues required, and needs further education  HOME EXERCISE PROGRAM: Access Code: [email or web address] URL: [email or web address] Date: 06/16/2023 Prepared by: Mose Minerva    ASSESSMENT:  CLINICAL IMPRESSION: Pt continues to have tightness and limitations in shoulder ROM especially ER and IR.  She tolerated PROM well.  Pt is progressing with protocol.  Pt performed light strengthening for scapular exercises.  Pt performed standing wand exercises and performed them in front of mirror for visual cuing for correct form including decreasing shoulder hike compensation.  Pt responded well to Rx having no pain after Rx.  Pt should benefit from cont skilled PT per protocol to improve ROM and strength, reduce pain, address goals, and to assist with returning to desired level of function.      OBJECTIVE IMPAIRMENTS: decreased ROM, decreased strength, hypomobility, impaired flexibility, impaired UE functional use, and pain.   ACTIVITY LIMITATIONS: carrying, lifting, bathing, dressing, reach over head, and hygiene/grooming  PARTICIPATION LIMITATIONS: meal prep, cleaning, laundry, driving, shopping, community activity, and occupation  PERSONAL FACTORS: 1-2 comorbidities: DM type 2 arthritis are also affecting patient's functional outcome.   REHAB POTENTIAL: Good  CLINICAL DECISION MAKING: Stable/uncomplicated  EVALUATION COMPLEXITY: Low   GOALS:   SHORT TERM GOALS:   Pt will be independent with HEP for improved pain, ROM, and function.  Baseline: Goal status: PROGRESSING Target date:   07/12/2023  2.  Pt will wean out of sling per MD instruction without adverse effects.  Baseline:  Goal status: GOAL MET Target date:   07/12/2023  3.  Pt will progress with PROM and AAROM per protocol without adverse effects for improved shoulder mobility and function. Baseline:  Goal status: INITIAL Target date:   07/12/2023  4.  Pt will demo R shoulder PROM/AAROM to be at least 140/120 deg in flexion (supine) and 50/45 deg in ER for improved shoulder mobility and  stiffness. Baseline:  Goal status: PROGRESSING   Target date:   07/26/2023   5.  Pt will demo at least 100 deg of R shoulder elevation in standing without significant UT compensatory hike.   Baseline:  Goal status: ONGOING Target date:   08/09/2023  6.  Ptwill be able to perform her self care activities with no > than minimal difficulty.  Baseline:  Goal status: GOAL MET  7/1 Target date:   09/06/2023   LONG TERM GOALS: Target date:  10/04/2023   Pt will demo R shoulder AROM to be [facility] t/o for performance of ADLs and IADLs. Baseline:  Goal status: PROGRESSING  2.  Pt will be able to perform her ADLs and IADLs including household chores without significant difficulty and pain.  Baseline:  Goal status:  INITIAL  3.  Pt will be able to perform her normal reaching and overhead activities without significant limitation and pain.  Baseline:  Goal status: INITIAL  4.  Pt will return to work with expected limitations without adverse effects.   Baseline:  Goal status: PROGRESSING  5.  Pt will demo R shoulder strength to be at least 4+/5 MMT in flexion, ER, and IR and 4/5 strength in abduction for performance of work activities and functional lifting/carrying.   Baseline:  Goal status: INITIAL   PLAN:  PT FREQUENCY: 2x/wk   PT DURATION: 6-7 weeks  PLANNED INTERVENTIONS: 97164- PT Re-evaluation, 97750- Physical Performance Testing, 97110-Therapeutic exercises, 97530- Therapeutic activity, [id number]- Neuromuscular re-education, 97535- Self Care, 02859- Manual therapy, [id number]- Aquatic Therapy, H9716- Electrical stimulation (unattended), [id number]- Electrical stimulation (manual), 97035- Ultrasound, Patient/Family education, Taping, Dry Needling, Joint mobilization, Spinal mobilization, Scar mobilization, Cryotherapy, and Moist heat  PLAN FOR NEXT SESSION: cont per biceps tenodesis protocol.  Leigh Minerva III PT, DPT 09/13/23 9:16 PM

## 2023-09-14 ENCOUNTER — Ambulatory Visit (HOSPITAL_BASED_OUTPATIENT_CLINIC_OR_DEPARTMENT_OTHER): Admitting: Physical Therapy

## 2023-09-26 ENCOUNTER — Ambulatory Visit (HOSPITAL_BASED_OUTPATIENT_CLINIC_OR_DEPARTMENT_OTHER): Attending: Orthopaedic Surgery | Admitting: Physical Therapy

## 2023-09-26 DIAGNOSIS — M25611 Stiffness of right shoulder, not elsewhere classified: Secondary | ICD-10-CM | POA: Insufficient documentation

## 2023-09-26 DIAGNOSIS — M25511 Pain in right shoulder: Secondary | ICD-10-CM | POA: Insufficient documentation

## 2023-09-26 DIAGNOSIS — M6281 Muscle weakness (generalized): Secondary | ICD-10-CM | POA: Insufficient documentation

## 2023-09-26 NOTE — Therapy (Signed)
 OUTPATIENT PHYSICAL THERAPY SHOULDER TREATMENT    Patient Name: Tonya Suarez MRN: [id number] DOB:1970/10/11, 53 y.o., female Today's Date: 09/27/2023  END OF SESSION:  PT End of Session - 09/26/23 1719     Visit Number 14    Number of Visits 29    Date for PT Re-Evaluation 10/04/23    Authorization Type U[employer]    PT Start Time 1628    PT Stop Time 1713    PT Time Calculation (min) 45 min    Activity Tolerance Patient tolerated treatment well    Behavior During Therapy WFL for tasks assessed/performed                 Past Medical History:  Diagnosis Date   Abnormal Pap smear of cervix    ascus hpv hr neg   Allergy    Arthritis    Chronic headaches    Depression    Diabetes mellitus without complication (HCC)    GERD (gastroesophageal reflux disease)    Hypertension    Low back pain    UTI (urinary tract infection)    Past Surgical History:  Procedure Laterality Date   CERVICAL BIOPSY  W/ LOOP ELECTRODE EXCISION     COLPOSCOPY     CIN2/3   SHOULDER ARTHROSCOPY WITH DISTAL CLAVICLE RESECTION Right 06/12/2023   Procedure: RIGHT SHOULDER ARTHROSCOPY WITH DISTAL CLAVICLE RESECTION, SUBPECTORIAL TENODESIS;  Surgeon: Genelle Standing, MD;  Location: [facility];  Service: Orthopedics;  Laterality: Right;   SUBACROMIAL DECOMPRESSION Right 06/12/2023   Procedure: RIGHT SUBACROMIAL DECOMPRESSION;  Surgeon: Genelle Standing, MD;  Location: [facility];  Service: Orthopedics;  Laterality: Right;   TUBAL LIGATION     Patient Active Problem List   Diagnosis Date Noted   Impingement syndrome of right shoulder 06/12/2023   Arthritis of right acromioclavicular joint 06/12/2023   Biceps tendinitis of right upper extremity 06/12/2023   Hyperlipidemia associated with type 2 diabetes mellitus (HCC) 05/25/2021   Controlled diabetes mellitus (HCC) 05/13/2021   Recurrent major depressive disorder, in full remission (HCC) 05/11/2021   Bilateral  sciatica 12/08/2020   Mass of thigh, left 08/26/2020   Pain in right hand 09/27/2018   Former smoker 07/19/2018   Constipation 07/19/2018   B12 deficiency 07/19/2018   Restless legs 07/19/2018   Ingrown toenail 12/20/2016   History of adenomatous polyp of colon 09/23/2016   Migraines 05/06/2016   Family history of cancer of GI tract 12/06/2012   HTN (hypertension) 11/21/2011   MENORRHAGIA 09/23/2009   PLANTAR FASCIITIS 09/20/2007   Osteoarthritis 09/19/2006   Depression 09/06/2006   Allergic rhinitis 09/06/2006   GERD 09/06/2006     REFERRING PROVIDER: Genelle Standing, MD  REFERRING DIAG: M75.41 (ICD-10-CM) - Impingement syndrome of right shoulder  S/p Arthroscopic limited debridement  Arthroscopic distal clavicle excision  Arthroscopic subacromial decompression   Arthroscopic biceps tenodesis   THERAPY DIAG:  Right shoulder pain, unspecified chronicity  Stiffness of right shoulder, not elsewhere classified  Muscle weakness (generalized)  Rationale for Evaluation and Treatment: Rehabilitation  ONSET DATE: DOS  06/12/2023  SUBJECTIVE:  SUBJECTIVE STATEMENT: Pt is 15 weeks and 1 day post op.  Pt denies any adverse effects after prior Rx.  Pt states it's not been hurting though she thinks the weather has caused it to hurt today.  Pt reports she has seen a lot of improvement.  She reports improved strength and range of motion.  Pt reports improved reaching and she has less pain.      Hand dominance: Right  PERTINENT HISTORY: R shoulder arthroscopic biceps tenodesis, subacromial decompression, DCE, and limited debridement on 06/12/23  DM type 2, arthritis, LBP, HTN   PAIN:  Are you having pain? Yes NPRS:  4/10 currently, 10/10 worst, 0/10 best Location: anterior and lateral R  shoulder  PRECAUTIONS: Other: per surgical protocol    WEIGHT BEARING RESTRICTIONS: Yes R UE  FALLS:  Has patient fallen in last 6 months? 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    PLOF: Independent  PATIENT GOALS: to be able sleep well without shoulder pain.  To perform activities without pain  NEXT MD VISIT:  06/28/23  OBJECTIVE:  Note: Objective measures were completed at Evaluation unless otherwise noted.  DIAGNOSTIC FINDINGS:  Pt is post op.  Pt had a MRI before surgery.                                                                                                                               TREATMENT:   8/5 Pulleys in flexion, scaption, and abd 2x10 each  R shoulder AROM: Flexion in standing:  134 deg ER:  45 deg  Standing wand flexion x10 in front of mirror Standing and scaption x10 in front of mirror Standing wand ER x10 Standing wand IR x 10 Standing functional IR AROM x10 reps Flexion AROM in reclined position 2x10 reps Supine shoulder ABC  2# x 1 rep S/L ER 2x10  Pt received R shoulder PROM in flexion, abd, ER, and IR in supine per pt and tissue tolerance.  PT updated HEP and pt a HEP handout.  PT educated pt in correct form and appropriate frequency.    7/22 Pulleys in flexion, scaption, and abd 2x10 each  Pt received R shoulder PROM in flexion, scaption, abd, ER, and IR in supine per pt and tissue tolerance.   Supine flexion with 2# bar 2x10 Supine shoulder ABC x 1 rep with 1# Supine serratus punches AROM x 12, 2# 2x12 Prone row 1# x 12, 2# x12 Standing wand flexion x10 in front of mirror Standing and scaption x10 in front of mirror Standing wand ER x10 Standing wand IR x 10  7/17 PROM R shoulder STM biceps Supine flexion with 2# bar 2x10 Supine chest press 2# bar  2x10 Supine ABC x1 Theraband row GTB 2x15 Pulleys flexion and scaption Standing  active flexion x10 Standing active abduction x10 Seated press out 2x10    7/9 Reviewed pt presentation, pain levels, and response to prior Rx.  Pt received R shoulder PROM in flexion, scaption, ER, and IR per pt and tissue tolerance in supine.   7/1 Shoulder pulleys in flexion and scaption x 20 reps each  Pt received R shoulder PROM in flexion, abd, ER, and IR per pt and tissue tolerance.   R shoulder ROM: PROM:   Flexion:  139 Abd:  93 ER:  37 IR:  42  R shoulder AROM:  (supine) Flexion:  122 ER:  32  R shoulder AAROM:  128/127 Supine/standing  Supine shoulder ABC x 1 rep Standing wand flexion x 6 reps in front of mirror Standing wand scaption x10 reps in front of mirror Standing rows with RTB 2x12  PATIENT SURVEYS:  UEFI:  Initial/Current:  28/80 / 67/80   6/26   Manual:  Grade II and III inferior and posterior glides  Trigger point release to the upper trap and anterior shoulder Active release of pec  Review of self biceps STM   There-ex:  Supine wand press 2 lbs 2x10  Supine wand flexion 2lbs 2x10   Neuro-re-ed Supine ABC 1 lb 2x Row with band red 2x12  Shoulder extension 2x12  All with cueing to keep good posture   08/03/23 Manual: Grade II inferior glides in flexion R shoulder PROM in flexion, scaption, ER Supine AAROM with 1# bar 2 x 10 Supine wand ER 1# bar 2 x 10 reps Supine serratus punch 2# 2 x 10 Pulleys in flexion and scaption 3x10 each Wall ladder flexion 1x 10 Bent shoulder extension 1# 2 x 10 Bent row 1# 2 x 10  6/4 Pulleys in flexion and scaption 2x10 each Supine wand flexion x 10 reps Supine flexion AROM 2x10 reps Supine wand ER x 10 reps Supine ER AROM x10  Supine serratus punch 2 x 10 reps Supine shoulder ABC x 1 reps S/L shoulder Abduction with assistance from PT x 10 reps   Pt received R shoulder PROM in flexion, scaption, ER,  and IR in supine per pt and tissue tolerance w/n protocol ranges    PATIENT EDUCATION: Education details:  objective findings, dx, HEP, POC, relevant anatomy, ice usage, and exercise form.  PT answered pt's questions. Person educated: Patient Education method: Explanation, Demonstration, Tactile cues, Verbal cues Education comprehension: verbalized understanding, returned demonstration, verbal cues required, tactile cues required, and needs further education  HOME EXERCISE PROGRAM: Access Code: [email or web address] URL: [email or web address] Date: 06/16/2023 Prepared by: Mose Minerva  Updated HEP: - Standing Shoulder Flexion AAROM with Dowel  - 2 x daily - 7 x weekly - 1-2 sets - 10 reps - Sidelying Shoulder External Rotation  - 1 x daily - 7 x weekly - 2 sets - 10 reps - Single Arm Serratus Punches  - 1 x daily - 3-4 x weekly - 2 sets - 10 reps - Supine Shoulder Alphabet  - 1 x daily - 7 x weekly - 1 reps    ASSESSMENT:  CLINICAL IMPRESSION: Pt continues to have tightness and limitations in shoulder ROM especially ER and IR.  She tolerated PROM well.  Pt is progressing with protocol.  Pt performed light strengthening for scapular exercises.  Pt performed standing wand exercises and performed them in front of mirror for visual cuing for correct form including decreasing shoulder hike compensation.  Pt responded well to Rx having no pain after Rx.  Pt should benefit from cont skilled PT per protocol to improve ROM and strength, reduce pain, address goals, and to assist with returning to desired level of function.   PT updated HEP and gave pt a HEP handout.     OBJECTIVE IMPAIRMENTS: decreased ROM, decreased strength, hypomobility, impaired flexibility, impaired UE functional use, and pain.   ACTIVITY LIMITATIONS: carrying, lifting, bathing, dressing, reach over head, and hygiene/grooming  PARTICIPATION LIMITATIONS: meal prep, cleaning, laundry, driving, shopping, community  activity, and occupation  PERSONAL FACTORS: 1-2 comorbidities: DM type 2 arthritis are also affecting patient's functional outcome.   REHAB POTENTIAL: Good  CLINICAL DECISION MAKING: Stable/uncomplicated  EVALUATION COMPLEXITY: Low   GOALS:   SHORT TERM GOALS:   Pt will be independent with HEP for improved pain, ROM, and function.  Baseline: Goal status: PROGRESSING Target date:   07/12/2023  2.  Pt will wean out of sling per MD instruction without adverse effects.  Baseline:  Goal status: GOAL MET Target date:   07/12/2023  3.  Pt will progress with PROM and AAROM per protocol without adverse effects for improved shoulder mobility and function. Baseline:  Goal status: INITIAL Target date:   07/12/2023  4.  Pt will demo R shoulder PROM/AAROM to be at least 140/120 deg in flexion (supine) and 50/45 deg in ER for improved shoulder mobility and stiffness. Baseline:  Goal status: PROGRESSING   Target date:   07/26/2023   5.  Pt will demo at least 100 deg of R shoulder elevation in standing without significant UT compensatory hike.   Baseline:  Goal status: ONGOING Target date:   08/09/2023  6.  Ptwill be able to perform her self care activities with no > than minimal difficulty.  Baseline:  Goal status: GOAL MET  7/1 Target date:   09/06/2023   LONG TERM GOALS: Target date:  10/04/2023   Pt will demo R shoulder AROM to be [facility] t/o for performance of ADLs and IADLs. Baseline:  Goal status: PROGRESSING  2.  Pt will be able to perform her ADLs and IADLs including household chores without significant difficulty and pain.  Baseline:  Goal status: INITIAL  3.  Pt will be able to perform her normal reaching and overhead activities without significant limitation and pain.  Baseline:  Goal status: INITIAL  4.  Pt will return to work with expected limitations without adverse effects.   Baseline:  Goal status: PROGRESSING  5.  Pt will demo R shoulder strength to be at least  4+/5 MMT in flexion, ER, and IR and 4/5 strength in abduction for performance of work activities and functional lifting/carrying.   Baseline:  Goal status: INITIAL   PLAN:  PT FREQUENCY: 2x/wk   PT DURATION: 6-7 weeks  PLANNED INTERVENTIONS: 97164- PT Re-evaluation, 97750- Physical Performance Testing, 97110-Therapeutic exercises, 97530- Therapeutic activity, [id number]- Neuromuscular re-education, 97535- Self Care, [id number]- Manual therapy, [id number]- Aquatic Therapy, H9716- Electrical stimulation (unattended), [id number]- Electrical stimulation (manual), 97035- Ultrasound, Patient/Family education, Taping, Dry Needling, Joint mobilization, Spinal mobilization, Scar mobilization, Cryotherapy, and Moist heat  PLAN FOR NEXT SESSION: cont per biceps tenodesis protocol.  Leigh Minerva III PT, DPT 09/27/23 5:45 PM

## 2023-09-27 ENCOUNTER — Encounter (HOSPITAL_BASED_OUTPATIENT_CLINIC_OR_DEPARTMENT_OTHER): Payer: Self-pay | Admitting: Physical Therapy

## 2023-09-30 ENCOUNTER — Encounter (HOSPITAL_BASED_OUTPATIENT_CLINIC_OR_DEPARTMENT_OTHER): Admitting: Physical Therapy

## 2023-10-03 ENCOUNTER — Ambulatory Visit (HOSPITAL_BASED_OUTPATIENT_CLINIC_OR_DEPARTMENT_OTHER): Admitting: Physical Therapy

## 2023-10-03 DIAGNOSIS — M6281 Muscle weakness (generalized): Secondary | ICD-10-CM

## 2023-10-03 DIAGNOSIS — M25511 Pain in right shoulder: Secondary | ICD-10-CM

## 2023-10-03 DIAGNOSIS — M25611 Stiffness of right shoulder, not elsewhere classified: Secondary | ICD-10-CM

## 2023-10-03 NOTE — Therapy (Signed)
 OUTPATIENT PHYSICAL THERAPY SHOULDER TREATMENT    Patient Name: Tonya Suarez MRN: [id number] DOB:[birth year], 53 y.o., female Today's Date: 10/04/2023  END OF SESSION:  PT End of Session - 10/03/23 1654     Visit Number 15    Number of Visits 29    Date for PT Re-Evaluation 10/04/23    Authorization Type UHC    PT Start Time 1630    PT Stop Time 1720    PT Time Calculation (min) 50 min    Activity Tolerance Patient tolerated treatment well    Behavior During Therapy WFL for tasks assessed/performed                  Past Medical History:  Diagnosis Date   Abnormal Pap smear of cervix    ascus hpv hr neg   Allergy    Arthritis    Chronic headaches    Depression    Diabetes mellitus without complication (HCC)    GERD (gastroesophageal reflux disease)    Hypertension    Low back pain    UTI (urinary tract infection)    Past Surgical History:  Procedure Laterality Date   CERVICAL BIOPSY  W/ LOOP ELECTRODE EXCISION     COLPOSCOPY     CIN2/3   SHOULDER ARTHROSCOPY WITH DISTAL CLAVICLE RESECTION Right 06/12/2023   Procedure: RIGHT SHOULDER ARTHROSCOPY WITH DISTAL CLAVICLE RESECTION, SUBPECTORIAL TENODESIS;  Surgeon: Genelle Standing, MD;  Location: [facility];  Service: Orthopedics;  Laterality: Right;   SUBACROMIAL DECOMPRESSION Right 06/12/2023   Procedure: RIGHT SUBACROMIAL DECOMPRESSION;  Surgeon: Genelle Standing, MD;  Location: [facility];  Service: Orthopedics;  Laterality: Right;   TUBAL LIGATION     Patient Active Problem List   Diagnosis Date Noted   Impingement syndrome of right shoulder 06/12/2023   Arthritis of right acromioclavicular joint 06/12/2023   Biceps tendinitis of right upper extremity 06/12/2023   Hyperlipidemia associated with type 2 diabetes mellitus (HCC) 05/25/2021   Controlled diabetes mellitus (HCC) 05/13/2021   Recurrent major depressive disorder, in full remission (HCC) 05/11/2021   Bilateral  sciatica 12/08/2020   Mass of thigh, left 08/26/2020   Pain in right hand 09/27/2018   Former smoker 07/19/2018   Constipation 07/19/2018   B12 deficiency 07/19/2018   Restless legs 07/19/2018   Ingrown toenail 12/20/2016   History of adenomatous polyp of colon 09/23/2016   Migraines 05/06/2016   Family history of cancer of GI tract 12/06/2012   HTN (hypertension) 11/21/2011   MENORRHAGIA 09/23/2009   PLANTAR FASCIITIS 09/20/2007   Osteoarthritis 09/19/2006   Depression 09/06/2006   Allergic rhinitis 09/06/2006   GERD 09/06/2006     REFERRING PROVIDER: Genelle Standing, MD  REFERRING DIAG: M75.41 (ICD-10-CM) - Impingement syndrome of right shoulder  S/p Arthroscopic limited debridement  Arthroscopic distal clavicle excision  Arthroscopic subacromial decompression   Arthroscopic biceps tenodesis   THERAPY DIAG:  Right shoulder pain, unspecified chronicity  Stiffness of right shoulder, not elsewhere classified  Muscle weakness (generalized)  Rationale for Evaluation and Treatment: Rehabilitation  ONSET DATE: DOS  06/12/2023  SUBJECTIVE:  SUBJECTIVE STATEMENT: Pt is 16 weeks and 1 day post op.  Pt denies any adverse effects after prior Rx.  Pt has been performing her HEP though not as much this weekend due to being out of town.  [place] Her shoulder was a little sensitive that night, but was fine.  She reports improved strength and range of motion.       Hand dominance: Right  PERTINENT HISTORY: R shoulder arthroscopic biceps tenodesis, subacromial decompression, DCE, and limited debridement on 06/12/23  DM type 2, arthritis, LBP, HTN   PAIN:  Are you having pain? Yes NPRS:  0/10 currently, 10/10 worst, 0/10 best Location: anterior and lateral R  shoulder  PRECAUTIONS: Other: per surgical protocol    WEIGHT BEARING RESTRICTIONS: Yes R UE  FALLS:  Has patient fallen in last 6 months? 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    PLOF: Independent  PATIENT GOALS: to be able sleep well without shoulder pain.  To perform activities without pain  NEXT MD VISIT:  06/28/23  OBJECTIVE:  Note: Objective measures were completed at Evaluation unless otherwise noted.  DIAGNOSTIC FINDINGS:  Pt is post op.  Pt had a MRI before surgery.                                                                                                                               TREATMENT:  8/12 Pulleys in flexion, scaption, and abd x20 each Flexion AROM in reclined position 2x10 reps S/L shoulder abduction 2x10 S/L ER 2x10 Standing wand ER 2x10 Standing wand flexion 2x10 ER/IR with arm at side with YTB 2x10 each  ER AROM:  39 deg  Grade II-III AP and inf jt mobs to R GH jt Pt received R shoulder PROM in flexion, abd, ER, and IR in supine per pt and tissue tolerance.  PT updated HEP and pt a HEP handout.  PT educated pt in correct form and appropriate frequency.   8/5 Pulleys in flexion, scaption, and abd 2x10 each  R shoulder AROM: Flexion in standing:  134 deg ER:  45 deg  Standing wand flexion x10 in front of mirror Standing and scaption x10 in front of mirror Standing wand ER x10 Standing wand IR x 10 Standing functional IR AROM x10 reps Flexion AROM in reclined position 2x10 reps Supine shoulder ABC  2# x 1 rep S/L ER 2x10  Pt received R shoulder PROM in flexion, abd, ER, and IR in supine per pt and tissue tolerance.  PT updated HEP and pt a HEP handout.  PT educated pt in correct form and appropriate frequency.    7/22 Pulleys in flexion, scaption, and abd 2x10  each  Pt received R shoulder PROM in flexion, scaption, abd,  ER, and IR in supine per pt and tissue tolerance.   Supine flexion with 2# bar 2x10 Supine shoulder ABC x 1 rep with 1# Supine serratus punches AROM x 12, 2# 2x12 Prone row 1# x 12, 2# x12 Standing wand flexion x10 in front of mirror Standing and scaption x10 in front of mirror Standing wand ER x10 Standing wand IR x 10  7/17 PROM R shoulder STM biceps Supine flexion with 2# bar 2x10 Supine chest press 2# bar 2x10 Supine ABC x1 Theraband row GTB 2x15 Pulleys flexion and scaption Standing active flexion x10 Standing active abduction x10 Seated press out 2x10    7/9 Reviewed pt presentation, pain levels, and response to prior Rx.  Pt received R shoulder PROM in flexion, scaption, ER, and IR per pt and tissue tolerance in supine.   7/1 Shoulder pulleys in flexion and scaption x 20 reps each  Pt received R shoulder PROM in flexion, abd, ER, and IR per pt and tissue tolerance.   R shoulder ROM: PROM:   Flexion:  139 Abd:  93 ER:  37 IR:  42  R shoulder AROM:  (supine) Flexion:  122 ER:  32  R shoulder AAROM:  128/127 Supine/standing  Supine shoulder ABC x 1 rep Standing wand flexion x 6 reps in front of mirror Standing wand scaption x10 reps in front of mirror Standing rows with RTB 2x12  PATIENT SURVEYS:  UEFI:  Initial/Current:  28/80 / 67/80    PATIENT EDUCATION: Education details:  ROM findings, dx, HEP, POC, relevant anatomy, ice usage, and exercise form.  PT answered pt's questions. Person educated: Patient Education method: Explanation, Demonstration, Tactile cues, Verbal cues Education comprehension: verbalized understanding, returned demonstration, verbal cues required, tactile cues required, and needs further education  HOME EXERCISE PROGRAM: Access Code: [email or web address] URL: [email or web address] Date: 06/16/2023 Prepared by: Mose Minerva  Updated HEP: - Seated  Shoulder External Rotation AAROM with Dowel  - 2 x daily - 7 x weekly - 2 sets - 10 reps    ASSESSMENT:  CLINICAL IMPRESSION: Pt continues to have tightness and limitations in shoulder ROM and is behind with ROM.  PT worked on AROM in gravity minimized positions, AAROM, PROM, and MT to improved shoulder ROM, stiffness, and UE elevation.  PT reviewed and updated HEP.  Pt demonstrates good understanding of HEP.  Pt performed exercises well with cuing for correct form.  She responded well to Rx having no c/o's and no pain after Rx.  She should benefit from cont skilled PT per protocol to improve ROM and strength, reduce pain, address goals, and to assist with returning to desired level of function.       OBJECTIVE IMPAIRMENTS: decreased ROM, decreased strength, hypomobility, impaired flexibility, impaired UE functional use, and pain.   ACTIVITY LIMITATIONS: carrying, lifting, bathing, dressing, reach over head, and hygiene/grooming  PARTICIPATION LIMITATIONS: meal prep, cleaning, laundry, driving, shopping, community activity, and occupation  PERSONAL FACTORS: 1-2 comorbidities: DM type 2 arthritis are also affecting patient's functional outcome.   REHAB POTENTIAL: Good  CLINICAL DECISION MAKING: Stable/uncomplicated  EVALUATION COMPLEXITY: Low   GOALS:   SHORT TERM GOALS:   Pt will be independent with HEP for improved pain, ROM, and function.  Baseline: Goal status: PROGRESSING Target date:   07/12/2023  2.  Pt will wean out of sling per MD instruction without adverse effects.  Baseline:  Goal status: GOAL MET Target date:   07/12/2023  3.  Pt will progress with PROM and  AAROM per protocol without adverse effects for improved shoulder mobility and function. Baseline:  Goal status: INITIAL Target date:   07/12/2023  4.  Pt will demo R shoulder PROM/AAROM to be at least 140/120 deg in flexion (supine) and 50/45 deg in ER for improved shoulder mobility and  stiffness. Baseline:  Goal status: PROGRESSING   Target date:   07/26/2023   5.  Pt will demo at least 100 deg of R shoulder elevation in standing without significant UT compensatory hike.   Baseline:  Goal status: ONGOING Target date:   08/09/2023  6.  Ptwill be able to perform her self care activities with no > than minimal difficulty.  Baseline:  Goal status: GOAL MET  7/1 Target date:   09/06/2023   LONG TERM GOALS: Target date:  10/04/2023   Pt will demo R shoulder AROM to be [facility] t/o for performance of ADLs and IADLs. Baseline:  Goal status: PROGRESSING  2.  Pt will be able to perform her ADLs and IADLs including household chores without significant difficulty and pain.  Baseline:  Goal status: INITIAL  3.  Pt will be able to perform her normal reaching and overhead activities without significant limitation and pain.  Baseline:  Goal status: INITIAL  4.  Pt will return to work with expected limitations without adverse effects.   Baseline:  Goal status: PROGRESSING  5.  Pt will demo R shoulder strength to be at least 4+/5 MMT in flexion, ER, and IR and 4/5 strength in abduction for performance of work activities and functional lifting/carrying.   Baseline:  Goal status: INITIAL   PLAN:  PT FREQUENCY: 2x/wk   PT DURATION: 6-7 weeks  PLANNED INTERVENTIONS: 97164- PT Re-evaluation, 97750- Physical Performance Testing, 97110-Therapeutic exercises, 97530- Therapeutic activity, [id number]- Neuromuscular re-education, 97535- Self Care, [id number]- Manual therapy, [id number]- Aquatic Therapy, H9716- Electrical stimulation (unattended), [id number]- Electrical stimulation (manual), 97035- Ultrasound, Patient/Family education, Taping, Dry Needling, Joint mobilization, Spinal mobilization, Scar mobilization, Cryotherapy, and Moist heat  PLAN FOR NEXT SESSION: cont per biceps tenodesis protocol.  Recert next visit.  Leigh Minerva III PT, DPT 10/04/23 11:51 PM

## 2023-10-04 ENCOUNTER — Encounter (HOSPITAL_BASED_OUTPATIENT_CLINIC_OR_DEPARTMENT_OTHER): Payer: Self-pay | Admitting: Physical Therapy

## 2023-10-05 ENCOUNTER — Ambulatory Visit (HOSPITAL_BASED_OUTPATIENT_CLINIC_OR_DEPARTMENT_OTHER): Admitting: Physical Therapy

## 2023-10-05 DIAGNOSIS — M6281 Muscle weakness (generalized): Secondary | ICD-10-CM

## 2023-10-05 DIAGNOSIS — M25511 Pain in right shoulder: Secondary | ICD-10-CM

## 2023-10-05 DIAGNOSIS — M25611 Stiffness of right shoulder, not elsewhere classified: Secondary | ICD-10-CM

## 2023-10-05 NOTE — Therapy (Signed)
 OUTPATIENT PHYSICAL THERAPY SHOULDER TREATMENT / PROGRESS NOTE   Patient Name: Tonya Suarez MRN: [id number] DOB:12-12-1970, 53 y.o., female Today's Date: 10/06/2023  END OF SESSION:  PT End of Session - 10/05/23 1636     Visit Number 16    Number of Visits 28    Date for PT Re-Evaluation 11/16/23    Authorization Type [employer]    PT Start Time 1626    PT Stop Time 1711    PT Time Calculation (min) 45 min    Activity Tolerance Patient tolerated treatment well    Behavior During Therapy WFL for tasks assessed/performed                  Past Medical History:  Diagnosis Date   Abnormal Pap smear of cervix    ascus hpv hr neg   Allergy    Arthritis    Chronic headaches    Depression    Diabetes mellitus without complication (HCC)    GERD (gastroesophageal reflux disease)    Hypertension    Low back pain    UTI (urinary tract infection)    Past Surgical History:  Procedure Laterality Date   CERVICAL BIOPSY  W/ LOOP ELECTRODE EXCISION     COLPOSCOPY     CIN2/3   SHOULDER ARTHROSCOPY WITH DISTAL CLAVICLE RESECTION Right 06/12/2023   Procedure: RIGHT SHOULDER ARTHROSCOPY WITH DISTAL CLAVICLE RESECTION, SUBPECTORIAL TENODESIS;  Surgeon: Genelle Standing, MD;  Location: [facility];  Service: Orthopedics;  Laterality: Right;   SUBACROMIAL DECOMPRESSION Right 06/12/2023   Procedure: RIGHT SUBACROMIAL DECOMPRESSION;  Surgeon: Genelle Standing, MD;  Location:  [facility]ERY [facility];  Service: Orthopedics;  Laterality: Right;   TUBAL LIGATION     Patient Active Problem List   Diagnosis Date Noted   Impingement syndrome of right shoulder 06/12/2023   Arthritis of right acromioclavicular joint 06/12/2023   Biceps tendinitis of right upper extremity 06/12/2023   Hyperlipidemia associated with type 2 diabetes mellitus (HCC) 05/25/2021   Controlled diabetes mellitus (HCC) 05/13/2021   Recurrent major depressive disorder, in full remission (HCC) 05/11/2021    Bilateral sciatica 12/08/2020   Mass of thigh, left 08/26/2020   Pain in right hand 09/27/2018   Former smoker 07/19/2018   Constipation 07/19/2018   B12 deficiency 07/19/2018   Restless legs 07/19/2018   Ingrown toenail 12/20/2016   History of adenomatous polyp of colon 09/23/2016   Migraines 05/06/2016   Family history of cancer of GI tract 12/06/2012   HTN (hypertension) 11/21/2011   MENORRHAGIA 09/23/2009   PLANTAR FASCIITIS 09/20/2007   Osteoarthritis 09/19/2006   Depression 09/06/2006   Allergic rhinitis 09/06/2006   GERD 09/06/2006     REFERRING PROVIDER: Genelle Standing, MD  REFERRING DIAG: M75.41 (ICD-10-CM) - Impingement syndrome of right shoulder  S/p Arthroscopic limited debridement  Arthroscopic distal clavicle excision  Arthroscopic subacromial decompression   Arthroscopic biceps tenodesis   THERAPY DIAG:  Stiffness of right shoulder, not elsewhere classified  Right shoulder pain, unspecified chronicity  Muscle weakness (generalized)  Rationale for Evaluation and Treatment: Rehabilitation  ONSET DATE: DOS  06/12/2023  SUBJECTIVE:  SUBJECTIVE STATEMENT: Pt is 16 weeks and 3 days post op.  Pt denies any adverse effects after prior Rx.  Pt states she did have some anterior shoulder pain the following afternoon.  Pt states her range is better in certain motions.  Pt reports improved range with reaching behind back though still limited.  Pt has improved with hair care though has a little restriction.  Pt is limited with reaching across her body and also reaching overhead.    Hand dominance: Right  PERTINENT HISTORY: R shoulder arthroscopic biceps tenodesis, subacromial decompression, DCE, and limited debridement on 06/12/23  DM type 2, arthritis, LBP, HTN   PAIN:  Are you  having pain? Yes NPRS:  2-3/10 currently though not consistent, 10/10 worst, 0/10 best Location: anterior and lateral R shoulder  PRECAUTIONS: Other: per surgical protocol    WEIGHT BEARING RESTRICTIONS: Yes R UE  FALLS:  Has patient fallen in last 6 months? 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    PLOF: Independent  PATIENT GOALS: to be able sleep well without shoulder pain.  To perform activities without pain  NEXT MD VISIT:  06/28/23  OBJECTIVE:  Note: Objective measures were completed at Evaluation unless otherwise noted.  DIAGNOSTIC FINDINGS:  Pt is post op.  Pt had a MRI before surgery.                                                                                                                               TREATMENT:  8/14 Pulleys in flexion, scaption, and abd x20 each Flexion AROM in reclined position 2x10 reps S/L shoulder abduction 2x10  Pt received R shoulder PROM in flexion, abd, ER, and IR in supine per pt and tissue tolerance.  Shoulder AROM/PROM: Flexion: supine:  138/148.  Standing AROM:  128 Abd:  NT/113 Scaption:  113 ER:  52/57 IR:  47/52  ER/IR with arm at side with YTB 2x10 each Rows with RTB 2x10 Standing wall walks in flexion  UEFI:  72/80   8/12 Pulleys in flexion, scaption, and abd x20 each Flexion AROM in reclined position 2x10 reps S/L shoulder abduction 2x10 S/L ER 2x10 Standing wand ER 2x10 Standing wand flexion 2x10 ER/IR with arm at side with YTB 2x10 each  ER AROM:  39 deg  Grade II-III AP and inf jt mobs to R GH jt Pt received R shoulder PROM in flexion, abd, ER, and IR in supine per pt and tissue tolerance.  PT updated HEP and pt a HEP handout.  PT educated pt in correct form and appropriate frequency.   8/5 Pulleys in flexion, scaption, and abd 2x10  each  R shoulder AROM: Flexion in standing:  134 deg ER:  45 deg  Standing wand flexion x10 in front of mirror Standing  and scaption x10 in front of mirror Standing wand ER x10 Standing wand IR x 10 Standing functional IR AROM x10 reps Flexion AROM in reclined position 2x10 reps Supine shoulder ABC  2# x 1 rep S/L ER 2x10  Pt received R shoulder PROM in flexion, abd, ER, and IR in supine per pt and tissue tolerance.  PT updated HEP and pt a HEP handout.  PT educated pt in correct form and appropriate frequency.      PATIENT EDUCATION: Education details:  ROM findings, dx, HEP, POC, relevant anatomy, ice usage, and exercise form.  PT answered pt's questions. Person educated: Patient Education method: Explanation, Demonstration, Tactile cues, Verbal cues Education comprehension: verbalized understanding, returned demonstration, verbal cues required, tactile cues required, and needs further education  HOME EXERCISE PROGRAM: Access Code: [email or web address] URL: [email or web address] Date: 06/16/2023 Prepared by: Mose Minerva  Updated HEP: - Seated Shoulder External Rotation AAROM with Dowel  - 2 x daily - 7 x weekly - 2 sets - 10 reps    ASSESSMENT:  CLINICAL IMPRESSION: Pt is 16 weeks and 3 days post op.  Pt continues to have tightness and limitations in shoulder ROM.  Pt is limited with UE elevation and has a compensatory shoulder hike.  Pt was limited in attending PT due to her work schedule.  Now, since her work schedule has changed, she has been able to be more consistent with PT.  Pt has been trying to come to PT 2x/wk.  Pt reports improved ROM and performance of functional activities with R UE.  She has improved with functional activities including reaching behind back and performing hair care though does have limitations.  She is limited with reaching overhead.  PT is working on AROM in gravity minimized positions to improve UE elevation and PROM to improve  stiffness and mobility.  PT reviewed HEP.  Pt performed exercises well with cuing for correct form.  Pt met STG's #1,2,4,6 and partially met #5.  Pt is progressing toward other goals.  Pt demonstrates improved self perceived disability with UEFI improving.  She responded well to Rx having no c/o's and no pain after Rx.  She should benefit from cont skilled PT per protocol to improve ROM and strength, reduce pain, address goals, and to assist with returning to desired level of function.     OBJECTIVE IMPAIRMENTS: decreased ROM, decreased strength, hypomobility, impaired flexibility, impaired UE functional use, and pain.   ACTIVITY LIMITATIONS: carrying, lifting, bathing, dressing, reach over head, and hygiene/grooming  PARTICIPATION LIMITATIONS: meal prep, cleaning, laundry, driving, shopping, community activity, and occupation  PERSONAL FACTORS: 1-2 comorbidities: DM type 2 arthritis are also affecting patient's functional outcome.   REHAB POTENTIAL: Good  CLINICAL DECISION MAKING: Stable/uncomplicated  EVALUATION COMPLEXITY: Low   GOALS:   SHORT TERM GOALS:   Pt will be independent with HEP for improved pain, ROM, and function.  Baseline: Goal status: GOA MET Target date:   07/12/2023  2.  Pt will wean out of sling per MD instruction without adverse effects.  Baseline:  Goal status: GOAL MET Target date:   07/12/2023  3.  Pt will progress with PROM and AAROM per protocol without adverse effects for improved shoulder mobility and function. Baseline:  Goal status:  PROGRESSING Target date:   07/12/2023  4.  Pt will demo R shoulder PROM/AAROM to be at least 140/120 deg in flexion (supine) and 50/45 deg in ER for improved shoulder mobility and stiffness. Baseline:  Goal status:GOAL MET Target date:  07/26/2023   5.  Pt will demo at least 100 deg of R shoulder elevation in standing without significant UT compensatory hike.   Baseline:  Goal status: PARTIALLY MET Target date:    08/09/2023  6.  Ptwill be able to perform her self care activities with no > than minimal difficulty.  Baseline:  Goal status: GOAL MET  7/1 Target date:   09/06/2023   LONG TERM GOALS: Target date:  11/16/2023      Pt will demo R shoulder AROM to be [facility] t/o for performance of ADLs and IADLs. Baseline:  Goal status: PROGRESSING  2.  Pt will be able to perform her ADLs and IADLs including household chores without significant difficulty and pain.  Baseline:  Goal status: PROGRESSING  3.  Pt will be able to perform her normal reaching and overhead activities without significant limitation and pain.  Baseline:  Goal status:  ONGOING  4.  Pt will return to work with expected limitations without adverse effects.   Baseline:  Goal status: PROGRESSING  5.  Pt will demo R shoulder strength to be at least 4+/5 MMT in flexion, ER, and IR and 4/5 strength in abduction for performance of work activities and functional lifting/carrying.   Baseline:  Goal status: PROGRESSING   PLAN:  PT FREQUENCY: 2x/wk   PT DURATION: 6 weeks  PLANNED INTERVENTIONS: 97164- PT Re-evaluation, 97750- Physical Performance Testing, 97110-Therapeutic exercises, 97530- Therapeutic activity, [id number]- Neuromuscular re-education, 97535- Self Care, [id number]- Manual therapy, [id number]- Aquatic Therapy, [id number]- Electrical stimulation (unattended), [id number]- Electrical stimulation (manual), 97035- Ultrasound, Patient/Family education, Taping, Dry Needling, Joint mobilization, Spinal mobilization, Scar mobilization, Cryotherapy, and Moist heat  PLAN FOR NEXT SESSION: cont per biceps tenodesis protocol.  Work on improving ROM.  Assess ROM next visit.   Leigh Minerva III PT, DPT 10/06/23 2:17 PM

## 2023-10-06 ENCOUNTER — Encounter (HOSPITAL_BASED_OUTPATIENT_CLINIC_OR_DEPARTMENT_OTHER): Payer: Self-pay | Admitting: Physical Therapy

## 2023-10-10 ENCOUNTER — Ambulatory Visit (HOSPITAL_BASED_OUTPATIENT_CLINIC_OR_DEPARTMENT_OTHER): Admitting: Physical Therapy

## 2023-10-10 DIAGNOSIS — M25611 Stiffness of right shoulder, not elsewhere classified: Secondary | ICD-10-CM

## 2023-10-10 DIAGNOSIS — M6281 Muscle weakness (generalized): Secondary | ICD-10-CM

## 2023-10-10 DIAGNOSIS — M25511 Pain in right shoulder: Secondary | ICD-10-CM

## 2023-10-10 NOTE — Therapy (Signed)
 OUTPATIENT PHYSICAL THERAPY SHOULDER TREATMENT / PROGRESS NOTE   Patient Name: Tonya Suarez MRN: [id number] DOB:Oct 22, 1970, 53 y.o., female Today's Date: 10/10/2023  END OF SESSION:            Past Medical History:  Diagnosis Date   Abnormal Pap smear of cervix    ascus hpv hr neg   Allergy    Arthritis    Chronic headaches    Depression    Diabetes mellitus without complication (HCC)    GERD (gastroesophageal reflux disease)    Hypertension    Low back pain    UTI (urinary tract infection)    Past Surgical History:  Procedure Laterality Date   CERVICAL BIOPSY  W/ LOOP ELECTRODE EXCISION     COLPOSCOPY     CIN2/3   SHOULDER ARTHROSCOPY WITH DISTAL CLAVICLE RESECTION Right 06/12/2023   Procedure: RIGHT SHOULDER ARTHROSCOPY WITH DISTAL CLAVICLE RESECTION, SUBPECTORIAL TENODESIS;  Surgeon: Genelle Standing, MD;  Location: [facility];  Service: Orthopedics;  Laterality: Right;   SUBACROMIAL DECOMPRESSION Right 06/12/2023   Procedure: RIGHT SUBACROMIAL DECOMPRESSION;  Surgeon: Genelle Standing, MD;  Location:  [facility];  Service: Orthopedics;  Laterality: Right;   TUBAL LIGATION     Patient Active Problem List   Diagnosis Date Noted   Impingement syndrome of right shoulder 06/12/2023   Arthritis of right acromioclavicular joint 06/12/2023   Biceps tendinitis of right upper extremity 06/12/2023   Hyperlipidemia associated with type 2 diabetes mellitus (HCC) 05/25/2021   Controlled diabetes mellitus (HCC) 05/13/2021   Recurrent major depressive disorder, in full remission (HCC) 05/11/2021   Bilateral sciatica 12/08/2020   Mass of thigh, left 08/26/2020   Pain in right hand 09/27/2018   Former smoker 07/19/2018   Constipation 07/19/2018   B12 deficiency 07/19/2018   Restless legs 07/19/2018   Ingrown toenail 12/20/2016   History of adenomatous polyp of colon 09/23/2016   Migraines 05/06/2016   Family history of cancer of GI  tract 12/06/2012   HTN (hypertension) 11/21/2011   MENORRHAGIA 09/23/2009   PLANTAR FASCIITIS 09/20/2007   Osteoarthritis 09/19/2006   Depression 09/06/2006   Allergic rhinitis 09/06/2006   GERD 09/06/2006     REFERRING PROVIDER: Genelle Standing, MD  REFERRING DIAG: M75.41 (ICD-10-CM) - Impingement syndrome of right shoulder  S/p Arthroscopic limited debridement  Arthroscopic distal clavicle excision  Arthroscopic subacromial decompression   Arthroscopic biceps tenodesis   THERAPY DIAG:  No diagnosis found.  Rationale for Evaluation and Treatment: Rehabilitation  ONSET DATE: DOS  06/12/2023  SUBJECTIVE:  SUBJECTIVE STATEMENT: Pt is 17 weeks and 1 days post op.  Pt denies any adverse effects after prior Rx.  Pt states she did have some anterior shoulder pain the following afternoon.  Pt states her range is better in certain motions.  Pt reports improved range with reaching behind back though still limited.  Pt has improved with hair care though has a little restriction.  Pt is limited with reaching across her body and also reaching overhead.    Hand dominance: Right  PERTINENT HISTORY: R shoulder arthroscopic biceps tenodesis, subacromial decompression, DCE, and limited debridement on 06/12/23  DM type 2, arthritis, LBP, HTN   PAIN:  Are you having pain? Yes NPRS:  2-3/10 occasionally though not consistent, 10/10 worst, 0/10 best Location: anterior and lateral R shoulder  PRECAUTIONS: Other: per surgical protocol    WEIGHT BEARING RESTRICTIONS: Yes R UE  FALLS:  Has patient fallen in last 6 months? 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    PLOF: Independent  PATIENT GOALS: to be able sleep well without shoulder pain.  To perform activities without pain  NEXT MD VISIT:  06/28/23  OBJECTIVE:  Note: Objective measures were completed at Evaluation unless otherwise noted.  DIAGNOSTIC FINDINGS:  Pt is post op.  Pt had a MRI before surgery.                                                                                                                               TREATMENT:  8/19 Pulleys in flexion, scaption, and abd 2x10 each UBE x 3 mins lvl 1  Pt received R shoulder PROM in flexion, abd, ER, and IR in supine.   R shoulder Flexion in standing: AROM/AAROM:  133/145  Jobe's flexion 1# 2x10 Standing scaption x10, 1# x10 Standing ER/IR with arm at side  with YTB 2x10  PT updated HEP and pt a HEP handout.  PT educated pt in correct form and appropriate frequency.     8/14 Pulleys in flexion, scaption, and abd x20 each Flexion AROM in reclined position 2x10 reps S/L shoulder abduction 2x10  Pt received R shoulder PROM in flexion, abd, ER, and IR in supine per pt and tissue tolerance.  Shoulder AROM/PROM: Flexion: supine:  138/148.  Standing AROM:  128 Abd:  NT/113 Scaption:  113 ER:  52/57 IR:  47/52  ER/IR with arm at side with YTB 2x10 each Rows with RTB 2x10 Standing wall walks in flexion  UEFI:  72/80   8/12 Pulleys in flexion, scaption, and abd x20 each Flexion AROM in reclined position 2x10 reps S/L shoulder abduction 2x10 S/L ER 2x10 Standing wand ER 2x10 Standing wand flexion 2x10 ER/IR with arm at side with YTB 2x10 each  ER AROM:  39 deg  Grade II-III  AP and inf jt mobs to R GH jt Pt received R shoulder PROM in flexion, abd, ER, and IR in supine per pt and tissue tolerance.  PT updated HEP and pt a HEP handout.  PT educated pt in correct form and appropriate frequency.   8/5 Pulleys in flexion, scaption, and abd 2x10 each  R shoulder AROM: Flexion in standing:   134 deg ER:  45 deg  Standing wand flexion x10 in front of mirror Standing and scaption x10 in front of mirror Standing wand ER x10 Standing wand IR x 10 Standing functional IR AROM x10 reps Flexion AROM in reclined position 2x10 reps Supine shoulder ABC  2# x 1 rep S/L ER 2x10  Pt received R shoulder PROM in flexion, abd, ER, and IR in supine per pt and tissue tolerance.  PT updated HEP and pt a HEP handout.  PT educated pt in correct form and appropriate frequency.      PATIENT EDUCATION: Education details:  ROM findings, dx, HEP, POC, relevant anatomy, ice usage, and exercise form.  PT answered pt's questions. Person educated: Patient Education method: Explanation, Demonstration, Tactile cues, Verbal cues Education comprehension: verbalized understanding, returned demonstration, verbal cues required, tactile cues required, and needs further education  HOME EXERCISE PROGRAM: Access Code: [email or web address] URL: [email or web address] Date: 06/16/2023 Prepared by: Mose Minerva  Updated HEP: - Standing Shoulder Flexion to 90 Degrees  - 1 x daily - 6-7 x weekly - 2 sets - 10 reps - Standing Shoulder Scaption  - 1 x daily - 6-7 x weekly - 2 sets - 10 reps - Shoulder External Rotation with Anchored Resistance  - 1 x daily - 3 x weekly - 2-3 sets - 10 reps - Shoulder Internal Rotation with Resistance  - 1 x daily - 3 x weekly - 2-3 sets - 10 reps    ASSESSMENT:  CLINICAL IMPRESSION: Pt is 16 weeks and 3 days post op.  Pt continues to have tightness and limitations in shoulder ROM.  Pt is limited with UE elevation and has a compensatory shoulder hike.  Pt was limited in attending PT due to her work schedule.  Now, since her work schedule has changed, she has been able to be more consistent with PT.  Pt has been trying to come to PT 2x/wk.  Pt reports improved ROM and performance of functional activities with R UE.  She has improved with functional activities including  reaching behind back and performing hair care though does have limitations.  She is limited with reaching overhead.  PT is working on AROM in gravity minimized positions to improve UE elevation and PROM to improve stiffness and mobility.  PT reviewed HEP.  Pt performed exercises well with cuing for correct form.  Pt met STG's #1,2,4,6 and partially met #5.  Pt is progressing toward other goals.  Pt demonstrates improved self perceived disability with UEFI improving.  She responded well to Rx having no c/o's and no pain after Rx.  She should benefit from cont skilled PT per protocol to improve ROM and strength, reduce pain, address goals, and to assist with returning to desired level of function.   Pt was tighter in ER today though demonstrated improved flexion AROM in standing.     OBJECTIVE IMPAIRMENTS: decreased ROM, decreased strength, hypomobility, impaired flexibility, impaired UE functional use, and pain.   ACTIVITY LIMITATIONS: carrying, lifting, bathing, dressing, reach over head, and hygiene/grooming  PARTICIPATION LIMITATIONS: meal prep, cleaning, laundry, driving, shopping, community activity, and occupation  PERSONAL FACTORS: 1-2 comorbidities: DM type 2 arthritis are also affecting patient's functional outcome.   REHAB POTENTIAL: Good  CLINICAL DECISION MAKING: Stable/uncomplicated  EVALUATION COMPLEXITY: Low   GOALS:   SHORT TERM GOALS:   Pt will be independent with HEP for improved pain, ROM, and function.  Baseline: Goal status: GOA MET Target date:   07/12/2023  2.  Pt will wean out of sling per MD instruction without adverse effects.  Baseline:  Goal status: GOAL MET Target date:   07/12/2023  3.  Pt will progress with PROM and AAROM per protocol without adverse effects for improved shoulder mobility and function. Baseline:  Goal status:  PROGRESSING Target date:   07/12/2023  4.  Pt will demo R shoulder PROM/AAROM to be at least 140/120 deg in flexion (supine)  and 50/45 deg in ER for improved shoulder mobility and stiffness. Baseline:  Goal status:GOAL MET Target date:   07/26/2023   5.  Pt will demo at least 100 deg of R shoulder elevation in standing without significant UT compensatory hike.   Baseline:  Goal status: PARTIALLY MET Target date:   08/09/2023  6.  Ptwill be able to perform her self care activities with no > than minimal difficulty.  Baseline:  Goal status: GOAL MET  7/1 Target date:   09/06/2023   LONG TERM GOALS: Target date:  11/16/2023      Pt will demo R shoulder AROM to be [facility] t/o for performance of ADLs and IADLs. Baseline:  Goal status: PROGRESSING  2.  Pt will be able to perform her ADLs and IADLs including household chores without significant difficulty and pain.  Baseline:  Goal status: PROGRESSING  3.  Pt will be able to perform her normal reaching and overhead activities without significant limitation and pain.  Baseline:  Goal status:  ONGOING  4.  Pt will return to work with expected limitations without adverse effects.   Baseline:  Goal status: PROGRESSING  5.  Pt will demo R shoulder strength to be at least 4+/5 MMT in flexion, ER, and IR and 4/5 strength in abduction for performance of work activities and functional lifting/carrying.   Baseline:  Goal status: PROGRESSING   PLAN:  PT FREQUENCY: 2x/wk   PT DURATION: 6 weeks  PLANNED INTERVENTIONS: 97164- PT Re-evaluation, 97750- Physical Performance Testing, 97110-Therapeutic exercises, 97530- Therapeutic activity, [id number]- Neuromuscular re-education, 97535- Self Care, 02859- Manual therapy, [id number]- Aquatic Therapy, [id number]- Electrical stimulation (unattended), [id number]- Electrical stimulation (manual), 97035- Ultrasound, Patient/Family education, Taping, Dry Needling, Joint mobilization, Spinal mobilization, Scar mobilization, Cryotherapy, and Moist heat  PLAN FOR NEXT SESSION: cont per biceps tenodesis protocol.  Work on improving ROM.  Assess ROM next  visit.   Leigh Minerva III PT, DPT 10/10/23 4:32 PM

## 2023-10-11 ENCOUNTER — Ambulatory Visit (HOSPITAL_BASED_OUTPATIENT_CLINIC_OR_DEPARTMENT_OTHER): Admitting: Orthopaedic Surgery

## 2023-10-11 ENCOUNTER — Encounter (HOSPITAL_BASED_OUTPATIENT_CLINIC_OR_DEPARTMENT_OTHER): Payer: Self-pay | Admitting: Physical Therapy

## 2023-10-11 DIAGNOSIS — M7521 Bicipital tendinitis, right shoulder: Secondary | ICD-10-CM

## 2023-10-11 NOTE — Progress Notes (Signed)
 Post Operative Evaluation    Procedure/Date of Surgery: Right shoulder arthroscopy with distal clavicle resection and subacromial decompression  Interval History:   Presents today status post above procedure.  Overall she does feel much better after her most recent injection.  Progress.  With regard to the left hip unfortunately her hip injection is worn off.  PMH/PSH/Family History/Social History/Meds/Allergies:    Past Medical History:  Diagnosis Date   Abnormal Pap smear of cervix    ascus hpv hr neg   Allergy    Arthritis    Chronic headaches    Depression    Diabetes mellitus without complication (HCC)    GERD (gastroesophageal reflux disease)    Hypertension    Low back pain    UTI (urinary tract infection)    Past Surgical History:  Procedure Laterality Date   CERVICAL BIOPSY  W/ LOOP ELECTRODE EXCISION     COLPOSCOPY     CIN2/3   SHOULDER ARTHROSCOPY WITH DISTAL CLAVICLE RESECTION Right 06/12/2023   Procedure: RIGHT SHOULDER ARTHROSCOPY WITH DISTAL CLAVICLE RESECTION, SUBPECTORIAL TENODESIS;  Surgeon: Genelle Standing, MD;  Location: [facility];  Service: Orthopedics;  Laterality: Right;   SUBACROMIAL DECOMPRESSION Right 06/12/2023   Procedure: RIGHT SUBACROMIAL DECOMPRESSION;  Surgeon: Genelle Standing, MD;  Location:  SURGERY CENTER;  Service: Orthopedics;  Laterality: Right;   TUBAL LIGATION     Social History   Socioeconomic History   Marital status: Married    Spouse name: Not on file   Number of children: Not on file   Years of education: Not on file   Highest education level: Not on file  Occupational History   Not on file  Tobacco Use   Smoking status: Former    Types: Cigars   Smokeless tobacco: Never  Vaping Use   Vaping status: Never Used  Substance and Sexual Activity   Alcohol use: Yes    Alcohol/week: 0.0 - 2.0 standard drinks of alcohol    Comment: occ   Drug use: No   Sexual  activity: Yes    Partners: Male    Birth control/protection: Surgical, Post-menopausal    Comment: tubal ligation  Other Topics Concern   Not on file  Social History Narrative   Married May 13th 2017. 2 children- son 55 (married) and daughter 29. Granddaughter august 2017.       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      Hobbies: time with family   Social Drivers of Corporate investment banker Strain: Not on file  Food Insecurity: Not on file  Transportation Needs: Not on file  Physical Activity: Not on file  Stress: Not on file  Social Connections: Unknown (08/03/2021)   Received from [employer]   Social Network    Social Network: Not on file   Family History  Problem Relation Age of Onset   Colon polyps Mother    Diabetes Mother    Hypertension Mother    Hyperlipidemia Mother    Kidney disease Mother        has fistula- could need dialysis at any time   Colon polyps Father    Diabetes Father        mother   Hyperlipidemia Father        mother  Hypertension Father        mother   Colon cancer Father        53s, and grandmother   Lung cancer Father        in 81s   Deep vein thrombosis Father        not during dtime of cancer. also paternal aunts reported   Cancer - Other Sister        duodenal   Stomach cancer Sister    Colon cancer Paternal Grandmother    Colon cancer Maternal Aunt    Esophageal cancer Neg Hx    Liver cancer Neg Hx    Pancreatic cancer Neg Hx    Rectal cancer Neg Hx    Allergies  Allergen Reactions   Amoxicillin Swelling    Lip swelling   Nsaids     bleeding   Current Outpatient Medications  Medication Sig Dispense Refill   amLODipine  (NORVASC ) 2.5 MG tablet TAKE 1 TABLET BY MOUTH EVERY DAY 90 tablet 3   aspirin  EC 325 MG tablet Take 1 tablet (325 mg total) by mouth daily. 14 tablet 0   azelastine  (ASTELIN ) 0.1 % nasal spray Place 2 sprays into both nostrils 2 (two) times daily. 90 mL 3    clotrimazole -betamethasone  (LOTRISONE ) cream APPLY TO AFFECTED AREA TWICE A DAY 45 g 1   Continuous Blood Gluc Sensor (FREESTYLE LIBRE 3 SENSOR) MISC Quantity: 2 sensors/month NDC# [phone] Sensor Refills: PRN or 12 refills annually 2 each 12   diclofenac  Sodium (VOLTAREN ) 1 % GEL Apply 4 g topically 4 (four) times daily. To affected joint. 100 g 11   doxycycline  (VIBRA -TABS) 100 MG tablet Take 1 tablet (100 mg total) by mouth 2 (two) times daily. 20 tablet 1   fluticasone  (FLONASE ) 50 MCG/ACT nasal spray SPRAY 2 SPRAYS INTO EACH NOSTRIL EVERY DAY 48 mL 3   gabapentin  (NEURONTIN ) 300 MG capsule TAKE 2 CAPSULES BY MOUTH AT BEDTIME 180 capsule 1   hydrochlorothiazide  (HYDRODIURIL ) 25 MG tablet TAKE 1 TABLET (25 MG TOTAL) BY MOUTH DAILY. 90 tablet 3   ipratropium (ATROVENT ) 0.03 % nasal spray PLACE 2 SPRAYS INTO BOTH NOSTRILS EVERY 12 (TWELVE) HOURS. 90 mL 2   meloxicam  (MOBIC ) 7.5 MG tablet TAKE 1 TABLET (7.5 MG TOTAL) BY MOUTH DAILY AS NEEDED. FOR PAIN 90 tablet 1   oxyCODONE  (ROXICODONE ) 5 MG immediate release tablet Take 1 tablet (5 mg total) by mouth every 4 (four) hours as needed for severe pain (pain score 7-10) or breakthrough pain. 15 tablet 0   pantoprazole  (PROTONIX ) 40 MG tablet TAKE 1 TABLET BY MOUTH EVERY DAY 90 tablet 3   rosuvastatin  (CRESTOR ) 10 MG tablet TAKE 1 TABLET (10 MG TOTAL) BY MOUTH ONCE A WEEK. 13 tablet 3   Semaglutide ,0.25 or 0.5MG /DOS, (OZEMPIC , 0.25 OR 0.5 MG/DOSE,) 2 MG/3ML SOPN INJECT 0.25 MG AS DIRECTED ONCE A WEEK FOR 28 DAYS, THEN 0.5 MG ONCE A WEEK FOR 28 DAYS 3 mL 5   tiZANidine  (ZANAFLEX ) 4 MG tablet TAKE 0.5-1 TABLET (2-4 MG TOTAL) BY MOUTH EVERY 8 (EIGHT) HOURS AS NEEDED FOR MUSCLE SPASMS. 30 tablet 2   No current facility-administered medications for this visit.   No results found.  Review of Systems:   A ROS was performed including pertinent positives and negatives as documented in the HPI.   Musculoskeletal Exam:    Right shoulder incisions are  well-appearing without erythema or drainage.  There is a small rash about the anterior aspect of the elbow.  In  the spine position she can forward elevate to 90 degrees actively with external rotation at side to 30.  Distal neurosensory exam is intact   Left hip with a positive FADIR with tenderness about the femoral acetabular joint.  30 degrees internal rotation and 90 degrees of flexion with pain 45 degrees external rotation without pain.  Good abduction strength  Imaging:      I personally reviewed and interpreted the radiographs.   Assessment:    status post right shoulder distal clavicle resection subacromial decompression overall doing well.  Range of motion is improving nicely after injection.  With regard to the left hip she does have evidence of possible labral tearing.  She would like to defer additional imaging today  Plan :    - Return to clinic 8 weeks for likely final check          I personally saw and evaluated the patient, and participated in the management and treatment plan.  Elspeth Parker, MD Attending Physician, Orthopedic Surgery  This document was dictated using Dragon voice recognition software. A reasonable attempt at proof reading has been made to minimize errors.

## 2023-10-12 ENCOUNTER — Encounter (HOSPITAL_BASED_OUTPATIENT_CLINIC_OR_DEPARTMENT_OTHER): Payer: Self-pay | Admitting: Physical Therapy

## 2023-10-12 ENCOUNTER — Ambulatory Visit (HOSPITAL_BASED_OUTPATIENT_CLINIC_OR_DEPARTMENT_OTHER): Admitting: Physical Therapy

## 2023-10-12 DIAGNOSIS — M25511 Pain in right shoulder: Secondary | ICD-10-CM

## 2023-10-12 DIAGNOSIS — M6281 Muscle weakness (generalized): Secondary | ICD-10-CM

## 2023-10-12 DIAGNOSIS — M25611 Stiffness of right shoulder, not elsewhere classified: Secondary | ICD-10-CM

## 2023-10-12 NOTE — Therapy (Signed)
 OUTPATIENT PHYSICAL THERAPY SHOULDER TREATMENT   Patient Name: Tonya Suarez MRN: [id number] DOB:Nov 24, 1970, 53 y.o., female Today's Date: 10/13/2023  END OF SESSION:  PT End of Session - 10/12/23 1623     Visit Number 18    Number of Visits 28    Date for PT Re-Evaluation 11/16/23    Authorization Type U[employer]    PT Start Time 1620    PT Stop Time 1703    PT Time Calculation (min) 43 min    Activity Tolerance Patient tolerated treatment well    Behavior During Therapy WFL for tasks assessed/performed                   Past Medical History:  Diagnosis Date   Abnormal Pap smear of cervix    ascus hpv hr neg   Allergy    Arthritis    Chronic headaches    Depression    Diabetes mellitus without complication (HCC)    GERD (gastroesophageal reflux disease)    Hypertension    Low back pain    UTI (urinary tract infection)    Past Surgical History:  Procedure Laterality Date   CERVICAL BIOPSY  W/ LOOP ELECTRODE EXCISION     COLPOSCOPY     CIN2/3   SHOULDER ARTHROSCOPY WITH DISTAL CLAVICLE RESECTION Right 06/12/2023   Procedure: RIGHT SHOULDER ARTHROSCOPY WITH DISTAL CLAVICLE RESECTION, SUBPECTORIAL TENODESIS;  Surgeon: Genelle Standing, MD;  Location: [facility];  Service: Orthopedics;  Laterality: Right;   SUBACROMIAL DECOMPRESSION Right 06/12/2023   Procedure: RIGHT SUBACROMIAL DECOMPRESSION;  Surgeon: Genelle Standing, MD;  Location: [facility];  Service: Orthopedics;  Laterality: Right;   TUBAL LIGATION     Patient Active Problem List   Diagnosis Date Noted   Impingement syndrome of right shoulder 06/12/2023   Arthritis of right acromioclavicular joint 06/12/2023   Biceps tendinitis of right upper extremity 06/12/2023   Hyperlipidemia associated with type 2 diabetes mellitus (HCC) 05/25/2021   Controlled diabetes mellitus (HCC) 05/13/2021   Recurrent major depressive disorder, in full remission (HCC) 05/11/2021   Bilateral  sciatica 12/08/2020   Mass of thigh, left 08/26/2020   Pain in right hand 09/27/2018   Former smoker 07/19/2018   Constipation 07/19/2018   B12 deficiency 07/19/2018   Restless legs 07/19/2018   Ingrown toenail 12/20/2016   History of adenomatous polyp of colon 09/23/2016   Migraines 05/06/2016   Family history of cancer of GI tract 12/06/2012   HTN (hypertension) 11/21/2011   MENORRHAGIA 09/23/2009   PLANTAR FASCIITIS 09/20/2007   Osteoarthritis 09/19/2006   Depression 09/06/2006   Allergic rhinitis 09/06/2006   GERD 09/06/2006     REFERRING PROVIDER: Genelle Standing, MD  REFERRING DIAG: M75.41 (ICD-10-CM) - Impingement syndrome of right shoulder  S/p Arthroscopic limited debridement  Arthroscopic distal clavicle excision  Arthroscopic subacromial decompression   Arthroscopic biceps tenodesis   THERAPY DIAG:  Stiffness of right shoulder, not elsewhere classified  Right shoulder pain, unspecified chronicity  Muscle weakness (generalized)  Rationale for Evaluation and Treatment: Rehabilitation  ONSET DATE: DOS  06/12/2023  SUBJECTIVE:  SUBJECTIVE STATEMENT: Pt is 17 weeks and 3 days post op.  Pt states she felt pretty good after prior Rx.  Pt saw MD yesterday.  She states MD was pleased with ROM and instructed pt to continue with PT.  Pt denies pain currently.    Hand dominance: Right  PERTINENT HISTORY: R shoulder arthroscopic biceps tenodesis, subacromial decompression, DCE, and limited debridement on 06/12/23  DM type 2, arthritis, LBP, HTN   PAIN:  Are you having pain? Yes NPRS:  0/10 current, 10/10 worst, 0/10 best Location: anterior and lateral R shoulder  PRECAUTIONS: Other: per surgical protocol    WEIGHT BEARING RESTRICTIONS: Yes R UE  FALLS:  Has patient fallen in  last 6 months? 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    PLOF: Independent  PATIENT GOALS: to be able sleep well without shoulder pain.  To perform activities without pain  NEXT MD VISIT:  06/28/23  OBJECTIVE:  Note: Objective measures were completed at Evaluation unless otherwise noted.  DIAGNOSTIC FINDINGS:  Pt is post op.  Pt had a MRI before surgery.                                                                                                                               TREATMENT:  8/21 Pulleys in flexion, scaption, and abd 2x10 each Jobe's flexion 0# x0, 1# 2x10 Standing scaption  2x10  2# Standing ER with YTB 3x10, IR 2x10 with YTB, 1x10 with RTB Rows with BTB 2x10 Shelf reach 2x10 Supine shoulder ABC x 1 reps  2# S/L ER x10, 1# x10 Flexion AROM 2x10 in reclined position Standing functional IR AROM 2x10  Pt received R shoulder PROM in flexion, abd, ER, and IR in supine.   8/19 Pulleys in flexion, scaption, and abd 2x10 each UBE x 3 mins lvl 1  Pt received R shoulder PROM in flexion, abd, ER, and IR in supine.   R shoulder Flexion in standing: AROM/AAROM:  133/145  Jobe's flexion 1# 2x10 Standing scaption x10, 1# x10 Standing ER/IR with arm at side  with YTB 2x10  PT updated HEP and pt a HEP handout.  PT educated pt in correct form and appropriate frequency.     8/14 Pulleys in flexion, scaption, and abd x20 each Flexion AROM in reclined position 2x10 reps S/L shoulder abduction 2x10  Pt received R shoulder PROM in flexion, abd, ER, and IR in supine per pt and tissue tolerance.  Shoulder AROM/PROM: Flexion: supine:  138/148.  Standing AROM:  128 Abd:  NT/113 Scaption:  113 ER:  52/57 IR:  47/52  ER/IR with arm at side with YTB 2x10 each Rows with RTB 2x10 Standing wall walks in  flexion  UEFI:  72/80   8/12 Pulleys in flexion, scaption,  and abd x20 each Flexion AROM in reclined position 2x10 reps S/L shoulder abduction 2x10 S/L ER 2x10 Standing wand ER 2x10 Standing wand flexion 2x10 ER/IR with arm at side with YTB 2x10 each  ER AROM:  39 deg  Grade II-III AP and inf jt mobs to R GH jt Pt received R shoulder PROM in flexion, abd, ER, and IR in supine per pt and tissue tolerance.  PT updated HEP and pt a HEP handout.  PT educated pt in correct form and appropriate frequency.   8/5 Pulleys in flexion, scaption, and abd 2x10 each  R shoulder AROM: Flexion in standing:  134 deg ER:  45 deg  Standing wand flexion x10 in front of mirror Standing and scaption x10 in front of mirror Standing wand ER x10 Standing wand IR x 10 Standing functional IR AROM x10 reps Flexion AROM in reclined position 2x10 reps Supine shoulder ABC  2# x 1 rep S/L ER 2x10  Pt received R shoulder PROM in flexion, abd, ER, and IR in supine per pt and tissue tolerance.  PT updated HEP and pt a HEP handout.  PT educated pt in correct form and appropriate frequency.      PATIENT EDUCATION: Education details:  ROM findings, dx, HEP, POC, relevant anatomy, ice usage, and exercise form.  PT answered pt's questions. Person educated: Patient Education method: Explanation, Demonstration, Tactile cues, Verbal cues Education comprehension: verbalized understanding, returned demonstration, verbal cues required, tactile cues required, and needs further education  HOME EXERCISE PROGRAM: Access Code: [email or web address] URL: [email or web address] Date: 06/16/2023 Prepared by: Mose Minerva  Updated HEP: - Standing Shoulder Flexion to 90 Degrees  - 1 x daily - 6-7 x weekly - 2 sets - 10 reps - Standing Shoulder Scaption  - 1 x daily - 6-7 x weekly - 2 sets - 10 reps - Shoulder External Rotation with Anchored Resistance  - 1 x daily - 3 x weekly - 2-3 sets - 10 reps - Shoulder  Internal Rotation with Resistance  - 1 x daily - 3 x weekly - 2-3 sets - 10 reps    ASSESSMENT:  CLINICAL IMPRESSION: Pt presents to treatment reporting no pain.  PT worked on improving R shoulder and RTC strength, UE elevation, and function mobility of R UE.  PT added shelf reaching and pt performed well.  Pt performed standing flexion and scaption to 90 deg well with resistance.  She performed functional IR AROM and is limited with reaching behind back.  Pt has tightness with shoulder ROM.  She had some pain at end range PROM and tolerated PROM well.  She responded well to Rx having no c/o's and no increased pain after Rx.  She should benefit from cont skilled PT per protocol to improve ROM and strength, reduce pain, address goals, and to assist with returning to desired level of function.      OBJECTIVE IMPAIRMENTS: decreased ROM, decreased strength, hypomobility, impaired flexibility, impaired UE functional use, and pain.   ACTIVITY LIMITATIONS: carrying, lifting, bathing, dressing, reach over head, and hygiene/grooming  PARTICIPATION LIMITATIONS: meal prep, cleaning, laundry, driving, shopping, community activity, and occupation  PERSONAL FACTORS: 1-2 comorbidities: DM type 2 arthritis are also affecting patient's functional outcome.   REHAB POTENTIAL: Good  CLINICAL DECISION MAKING: Stable/uncomplicated  EVALUATION COMPLEXITY: Low   GOALS:   SHORT TERM GOALS:   Pt will be independent with HEP for improved pain, ROM, and function.  Baseline: Goal status: GOA MET Target date:   07/12/2023  2.  Pt will wean out of sling per MD instruction without adverse effects.  Baseline:  Goal status: GOAL MET Target date:   07/12/2023  3.  Pt will progress with PROM and AAROM per protocol without adverse effects for improved shoulder mobility and function. Baseline:  Goal status:  PROGRESSING Target date:   07/12/2023  4.  Pt will demo R shoulder PROM/AAROM to be at least 140/120 deg  in flexion (supine) and 50/45 deg in ER for improved shoulder mobility and stiffness. Baseline:  Goal status:GOAL MET Target date:   07/26/2023   5.  Pt will demo at least 100 deg of R shoulder elevation in standing without significant UT compensatory hike.   Baseline:  Goal status: PARTIALLY MET Target date:   08/09/2023  6.  Ptwill be able to perform her self care activities with no > than minimal difficulty.  Baseline:  Goal status: GOAL MET  7/1 Target date:   09/06/2023   LONG TERM GOALS: Target date:  11/16/2023      Pt will demo R shoulder AROM to be [facility] t/o for performance of ADLs and IADLs. Baseline:  Goal status: PROGRESSING  2.  Pt will be able to perform her ADLs and IADLs including household chores without significant difficulty and pain.  Baseline:  Goal status: PROGRESSING  3.  Pt will be able to perform her normal reaching and overhead activities without significant limitation and pain.  Baseline:  Goal status:  ONGOING  4.  Pt will return to work with expected limitations without adverse effects.   Baseline:  Goal status: PROGRESSING  5.  Pt will demo R shoulder strength to be at least 4+/5 MMT in flexion, ER, and IR and 4/5 strength in abduction for performance of work activities and functional lifting/carrying.   Baseline:  Goal status: PROGRESSING   PLAN:  PT FREQUENCY: 2x/wk   PT DURATION: 6 weeks  PLANNED INTERVENTIONS: [id number]- PT Re-evaluation, 97750- Physical Performance Testing, 97110-Therapeutic exercises, 97530- Therapeutic activity, [id number]- Neuromuscular re-education, 97535- Self Care, [id number]- Manual therapy, [id number]- Aquatic Therapy, [id number]- Electrical stimulation (unattended), [id number]- Electrical stimulation (manual), 97035- Ultrasound, Patient/Family education, Taping, Dry Needling, Joint mobilization, Spinal mobilization, Scar mobilization, Cryotherapy, and Moist heat  PLAN FOR NEXT SESSION: cont per biceps tenodesis protocol.  Work on improving  ROM.  A  Leigh Minerva III PT, DPT 10/13/23 3:27 PM

## 2023-10-17 ENCOUNTER — Ambulatory Visit (HOSPITAL_BASED_OUTPATIENT_CLINIC_OR_DEPARTMENT_OTHER): Admitting: Physical Therapy

## 2023-10-19 ENCOUNTER — Ambulatory Visit (HOSPITAL_BASED_OUTPATIENT_CLINIC_OR_DEPARTMENT_OTHER): Admitting: Physical Therapy

## 2023-10-24 ENCOUNTER — Ambulatory Visit (HOSPITAL_BASED_OUTPATIENT_CLINIC_OR_DEPARTMENT_OTHER): Payer: Self-pay | Admitting: Physical Therapy

## 2023-10-27 ENCOUNTER — Telehealth: Admitting: Physician Assistant

## 2023-10-27 DIAGNOSIS — R3989 Other symptoms and signs involving the genitourinary system: Secondary | ICD-10-CM | POA: Diagnosis not present

## 2023-10-27 MED ORDER — PHENAZOPYRIDINE HCL 100 MG PO TABS: 100.0000 mg | ORAL_TABLET | Freq: Three times a day (TID) | ORAL | 0 refills | Status: DC | PRN

## 2023-10-27 MED ORDER — NITROFURANTOIN MONOHYD MACRO 100 MG PO CAPS: 100.0000 mg | ORAL_CAPSULE | Freq: Two times a day (BID) | ORAL | 0 refills | Status: DC

## 2023-10-27 NOTE — Patient Instructions (Signed)
 Tonya Suarez, thank you for joining Delon CHRISTELLA Dickinson, PA-C for today's virtual visit.  While this provider is not your primary care provider (PCP), if your PCP is located in our provider database this encounter information will be shared with them immediately following your visit.   A [facility] MyChart account gives you access to today's visit and all your visits, tests, and labs performed at [facility]  click here if you don't have a [facility] MyChart account or go to [email or web address]  Consent: (Patient) Tonya Suarez provided verbal consent for this virtual visit at the beginning of the encounter.  Current Medications:  Current Outpatient Medications:    nitrofurantoin , macrocrystal-monohydrate, (MACROBID ) 100 MG capsule, Take 1 capsule (100 mg total) by mouth 2 (two) times daily., Disp: 10 capsule, Rfl: 0   phenazopyridine  (PYRIDIUM ) 100 MG tablet, Take 1 tablet (100 mg total) by mouth 3 (three) times daily as needed for pain., Disp: 10 tablet, Rfl: 0   amLODipine  (NORVASC ) 2.5 MG tablet, TAKE 1 TABLET BY MOUTH EVERY DAY, Disp: 90 tablet, Rfl: 3   aspirin  EC 325 MG tablet, Take 1 tablet (325 mg total) by mouth daily., Disp: 14 tablet, Rfl: 0   azelastine  (ASTELIN ) 0.1 % nasal spray, Place 2 sprays into both nostrils 2 (two) times daily., Disp: 90 mL, Rfl: 3   clotrimazole -betamethasone  (LOTRISONE ) cream, APPLY TO AFFECTED AREA TWICE A DAY, Disp: 45 g, Rfl: 1   Continuous Blood Gluc Sensor (FREESTYLE LIBRE 3 SENSOR) MISC, Quantity: 2 sensors/month NDC# [phone] Sensor Refills: PRN or 12 refills annually, Disp: 2 each, Rfl: 12   diclofenac  Sodium (VOLTAREN ) 1 % GEL, Apply 4 g topically 4 (four) times daily. To affected joint., Disp: 100 g, Rfl: 11   doxycycline  (VIBRA -TABS) 100 MG tablet, Take 1 tablet (100 mg total) by mouth 2 (two) times daily., Disp: 20 tablet, Rfl: 1   fluticasone  (FLONASE ) 50 MCG/ACT nasal spray, SPRAY 2 SPRAYS INTO  EACH NOSTRIL EVERY DAY, Disp: 48 mL, Rfl: 3   gabapentin  (NEURONTIN ) 300 MG capsule, TAKE 2 CAPSULES BY MOUTH AT BEDTIME, Disp: 180 capsule, Rfl: 1   hydrochlorothiazide  (HYDRODIURIL ) 25 MG tablet, TAKE 1 TABLET (25 MG TOTAL) BY MOUTH DAILY., Disp: 90 tablet, Rfl: 3   ipratropium (ATROVENT ) 0.03 % nasal spray, PLACE 2 SPRAYS INTO BOTH NOSTRILS EVERY 12 (TWELVE) HOURS., Disp: 90 mL, Rfl: 2   meloxicam  (MOBIC ) 7.5 MG tablet, TAKE 1 TABLET (7.5 MG TOTAL) BY MOUTH DAILY AS NEEDED. FOR PAIN, Disp: 90 tablet, Rfl: 1   oxyCODONE  (ROXICODONE ) 5 MG immediate release tablet, Take 1 tablet (5 mg total) by mouth every 4 (four) hours as needed for severe pain (pain score 7-10) or breakthrough pain., Disp: 15 tablet, Rfl: 0   pantoprazole  (PROTONIX ) 40 MG tablet, TAKE 1 TABLET BY MOUTH EVERY DAY, Disp: 90 tablet, Rfl: 3   rosuvastatin  (CRESTOR ) 10 MG tablet, TAKE 1 TABLET (10 MG TOTAL) BY MOUTH ONCE A WEEK., Disp: 13 tablet, Rfl: 3   Semaglutide ,0.25 or 0.5MG /DOS, (OZEMPIC , 0.25 OR 0.5 MG/DOSE,) 2 MG/3ML SOPN, INJECT 0.25 MG AS DIRECTED ONCE A WEEK FOR 28 DAYS, THEN 0.5 MG ONCE A WEEK FOR 28 DAYS, Disp: 3 mL, Rfl: 5   tiZANidine  (ZANAFLEX ) 4 MG tablet, TAKE 0.5-1 TABLET (2-4 MG TOTAL) BY MOUTH EVERY 8 (EIGHT) HOURS AS NEEDED FOR MUSCLE SPASMS., Disp: 30 tablet, Rfl: 2   Medications ordered in this encounter:  Meds ordered this encounter  Medications   nitrofurantoin , macrocrystal-monohydrate, (MACROBID ) 100 MG  capsule    Sig: Take 1 capsule (100 mg total) by mouth 2 (two) times daily.    Dispense:  10 capsule    Refill:  0    Supervising Provider:   LAMPTEY, PHILIP O [[id number]]   phenazopyridine  (PYRIDIUM ) 100 MG tablet    Sig: Take 1 tablet (100 mg total) by mouth 3 (three) times daily as needed for pain.    Dispense:  10 tablet    Refill:  0    Supervising Provider:   BLAISE ALEENE KIDD [[id number]]     *If you need refills on other medications prior to your next appointment, please contact your  pharmacy*  Follow-Up: Call back or seek an in-person evaluation if the symptoms worsen or if the condition fails to improve as anticipated.  [facility] [phone]  Other Instructions Urinary Tract Infection, Female A urinary tract infection (UTI) is an infection in your urinary tract. The urinary tract is made up of organs that make, store, and get rid of pee (urine) in your body. These organs include: The kidneys. The ureters. The bladder. The urethra. What are the causes? Most UTIs are caused by germs called bacteria. They may be in or near your genitals. These germs grow and cause swelling in your urinary tract. What increases the risk? You're more likely to get a UTI if: You're a female. The urethra is shorter in females than in males. You have a soft tube called a catheter that drains your pee. You can't control when you pee or poop. You have trouble peeing because of: A kidney stone. A urinary blockage. A nerve condition that affects your bladder. Not getting enough to drink. You're sexually active. You use a birth control inside your vagina, like spermicide. You're pregnant. You have low levels of the hormone estrogen in your body. You're an older adult. You're also more likely to get a UTI if you have other health problems. These may include: Diabetes. A weak immune system. Your immune system is your body's defense system. Sickle cell disease. Injury of the spine. What are the signs or symptoms? Symptoms may include: Needing to pee right away. Peeing small amounts often. Pain or burning when you pee. Blood in your pee. Pee that smells bad or odd. Pain in your belly or lower back. You may also: Feel confused. This may be the first symptom in older adults. Vomit. Not feel hungry. Feel tired or easily annoyed. Have a fever or chills. How is this diagnosed? A UTI is diagnosed based on your medical history and an exam. You may also have other  tests. These may include: Pee tests. Blood tests. Tests for sexually transmitted infections (STIs). If you've had more than one UTI, you may need to have imaging studies done to find out why you keep getting them. How is this treated? A UTI can be treated by: Taking antibiotics or other medicines. Drinking enough fluid to keep your pee pale yellow. In rare cases, a UTI can cause a very bad condition called sepsis. Sepsis may be treated in the hospital. Follow these instructions at home: Medicines Take your medicines only as told by your health care provider. If you were given antibiotics, take them as told by your provider. Do not stop taking them even if you start to feel better. General instructions Make sure you: Pee often and fully. Do not hold your pee for a long time. Wipe from front to back after you pee or poop. Use each  tissue only once when you wipe. Pee after you have sex. Do not douche or use sprays or powders in your genital area. Contact a health care provider if: Your symptoms don't get better after 1-2 days of taking antibiotics. Your symptoms go away and then come back. You have a fever or chills. You vomit or feel like you may vomit. Get help right away if: You have very bad pain in your back or lower belly. You faint. This information is not intended to replace advice given to you by your health care provider. Make sure you discuss any questions you have with your health care provider. Document Revised: 01/18/2023 Document Reviewed: 05/13/2022 Elsevier Patient Education  [employer].   If you have been instructed to have an in-person evaluation today at a local Urgent Care facility, please use the link below. It will take you to a list of all of our available [facility], including address, phone number and hours of operation. Please do not delay care.  [facility]  If you or a family member do not have a primary care provider,  use the link below to schedule a visit and establish care. When you choose a [facility] primary care physician or advanced practice provider, you gain a long-term partner in health. Find a Primary Care Provider  Learn more about [facility]'s in-office and virtual care options: [facility] - Get Care Now

## 2023-10-27 NOTE — Progress Notes (Signed)
 Virtual Visit Consent   Tonya Suarez, you are scheduled for a virtual visit with a [facility] provider today. Just as with appointments in the office, your consent must be obtained to participate. Your consent will be active for this visit and any virtual visit you may have with one of our providers in the next 365 days. If you have a MyChart account, a copy of this consent can be sent to you electronically.  As this is a virtual visit, video technology does not allow for your provider to perform a traditional examination. This may limit your provider's ability to fully assess your condition. If your provider identifies any concerns that need to be evaluated in person or the need to arrange testing (such as labs, EKG, etc.), we will make arrangements to do so. Although advances in technology are sophisticated, we cannot ensure that it will always work on either your end or our end. If the connection with a video visit is poor, the visit may have to be switched to a telephone visit. With either a video or telephone visit, we are not always able to ensure that we have a secure connection.  By engaging in this virtual visit, you consent to the provision of healthcare and authorize for your insurance to be billed (if applicable) for the services provided during this visit. Depending on your insurance coverage, you may receive a charge related to this service.  I need to obtain your verbal consent now. Are you willing to proceed with your visit today? Tonya Suarez has provided verbal consent on 10/27/2023 for a virtual visit (video or telephone). Delon CHRISTELLA Dickinson, PA-C  Date: 10/27/2023 4:50 PM   Virtual Visit via Video Note   I, Delon CHRISTELLA Dickinson, connected with  Tonya Suarez  ([id number], [birth year]) on 10/27/23 at  4:45 PM EDT by a video-enabled telemedicine application and verified that I am speaking with the correct person using two identifiers.  Location: Patient:  Virtual Visit Location Patient: Home Provider: Virtual Visit Location Provider: Home Office   I discussed the limitations of evaluation and management by telemedicine and the availability of in person appointments. The patient expressed understanding and agreed to proceed.    History of Present Illness: Tonya Suarez is a 53 y.o. who identifies as a female who was assigned female at birth, and is being seen today for dysuria and frequency.  HPI: Urinary Tract Infection  This is a new problem. The current episode started in the past 7 days (last couple of days and worsened today). The problem occurs every urination. The problem has been gradually worsening. The quality of the pain is described as burning. There has been no fever. Associated symptoms include flank pain (left), frequency, hesitancy and urgency. Pertinent negatives include no chills, discharge, hematuria, nausea, sweats or vomiting. She has tried increased fluids and acetaminophen  (cranberry juice) for the symptoms. The treatment provided no relief.     Problems:  Patient Active Problem List   Diagnosis Date Noted   Impingement syndrome of right shoulder 06/12/2023   Arthritis of right acromioclavicular joint 06/12/2023   Biceps tendinitis of right upper extremity 06/12/2023   Hyperlipidemia associated with type 2 diabetes mellitus (HCC) 05/25/2021   Controlled diabetes mellitus (HCC) 05/13/2021   Recurrent major depressive disorder, in full remission (HCC) 05/11/2021   Bilateral sciatica 12/08/2020   Mass of thigh, left 08/26/2020   Pain in right hand 09/27/2018   Former smoker 07/19/2018   Constipation 07/19/2018  B12 deficiency 07/19/2018   Restless legs 07/19/2018   Ingrown toenail 12/20/2016   History of adenomatous polyp of colon 09/23/2016   Migraines 05/06/2016   Family history of cancer of GI tract 12/06/2012   HTN (hypertension) 11/21/2011   MENORRHAGIA 09/23/2009   PLANTAR FASCIITIS 09/20/2007    Osteoarthritis 09/19/2006   Depression 09/06/2006   Allergic rhinitis 09/06/2006   GERD 09/06/2006    Allergies:  Allergies  Allergen Reactions   Amoxicillin Swelling    Lip swelling   Nsaids     bleeding   Medications:  Current Outpatient Medications:    nitrofurantoin , macrocrystal-monohydrate, (MACROBID ) 100 MG capsule, Take 1 capsule (100 mg total) by mouth 2 (two) times daily., Disp: 10 capsule, Rfl: 0   phenazopyridine  (PYRIDIUM ) 100 MG tablet, Take 1 tablet (100 mg total) by mouth 3 (three) times daily as needed for pain., Disp: 10 tablet, Rfl: 0   amLODipine  (NORVASC ) 2.5 MG tablet, TAKE 1 TABLET BY MOUTH EVERY DAY, Disp: 90 tablet, Rfl: 3   aspirin  EC 325 MG tablet, Take 1 tablet (325 mg total) by mouth daily., Disp: 14 tablet, Rfl: 0   azelastine  (ASTELIN ) 0.1 % nasal spray, Place 2 sprays into both nostrils 2 (two) times daily., Disp: 90 mL, Rfl: 3   clotrimazole -betamethasone  (LOTRISONE ) cream, APPLY TO AFFECTED AREA TWICE A DAY, Disp: 45 g, Rfl: 1   Continuous Blood Gluc Sensor (FREESTYLE LIBRE 3 SENSOR) MISC, Quantity: 2 sensors/month NDC# [phone] Sensor Refills: PRN or 12 refills annually, Disp: 2 each, Rfl: 12   diclofenac  Sodium (VOLTAREN ) 1 % GEL, Apply 4 g topically 4 (four) times daily. To affected joint., Disp: 100 g, Rfl: 11   doxycycline  (VIBRA -TABS) 100 MG tablet, Take 1 tablet (100 mg total) by mouth 2 (two) times daily., Disp: 20 tablet, Rfl: 1   fluticasone  (FLONASE ) 50 MCG/ACT nasal spray, SPRAY 2 SPRAYS INTO EACH NOSTRIL EVERY DAY, Disp: 48 mL, Rfl: 3   gabapentin  (NEURONTIN ) 300 MG capsule, TAKE 2 CAPSULES BY MOUTH AT BEDTIME, Disp: 180 capsule, Rfl: 1   hydrochlorothiazide  (HYDRODIURIL ) 25 MG tablet, TAKE 1 TABLET (25 MG TOTAL) BY MOUTH DAILY., Disp: 90 tablet, Rfl: 3   ipratropium (ATROVENT ) 0.03 % nasal spray, PLACE 2 SPRAYS INTO BOTH NOSTRILS EVERY 12 (TWELVE) HOURS., Disp: 90 mL, Rfl: 2   meloxicam  (MOBIC ) 7.5 MG tablet, TAKE 1 TABLET (7.5 MG  TOTAL) BY MOUTH DAILY AS NEEDED. FOR PAIN, Disp: 90 tablet, Rfl: 1   oxyCODONE  (ROXICODONE ) 5 MG immediate release tablet, Take 1 tablet (5 mg total) by mouth every 4 (four) hours as needed for severe pain (pain score 7-10) or breakthrough pain., Disp: 15 tablet, Rfl: 0   pantoprazole  (PROTONIX ) 40 MG tablet, TAKE 1 TABLET BY MOUTH EVERY DAY, Disp: 90 tablet, Rfl: 3   rosuvastatin  (CRESTOR ) 10 MG tablet, TAKE 1 TABLET (10 MG TOTAL) BY MOUTH ONCE A WEEK., Disp: 13 tablet, Rfl: 3   Semaglutide ,0.25 or 0.5MG /DOS, (OZEMPIC , 0.25 OR 0.5 MG/DOSE,) 2 MG/3ML SOPN, INJECT 0.25 MG AS DIRECTED ONCE A WEEK FOR 28 DAYS, THEN 0.5 MG ONCE A WEEK FOR 28 DAYS, Disp: 3 mL, Rfl: 5   tiZANidine  (ZANAFLEX ) 4 MG tablet, TAKE 0.5-1 TABLET (2-4 MG TOTAL) BY MOUTH EVERY 8 (EIGHT) HOURS AS NEEDED FOR MUSCLE SPASMS., Disp: 30 tablet, Rfl: 2  Observations/Objective: Patient is well-developed, well-nourished in no acute distress.  Resting comfortably at home.  Head is normocephalic, atraumatic.  No labored breathing.  Speech is clear and coherent with logical content.  Patient is alert and oriented at baseline.    Assessment and Plan: 1. Suspected UTI (Primary) - nitrofurantoin , macrocrystal-monohydrate, (MACROBID ) 100 MG capsule; Take 1 capsule (100 mg total) by mouth 2 (two) times daily.  Dispense: 10 capsule; Refill: 0 - phenazopyridine  (PYRIDIUM ) 100 MG tablet; Take 1 tablet (100 mg total) by mouth 3 (three) times daily as needed for pain.  Dispense: 10 tablet; Refill: 0  - Worsening symptoms.  - Will treat empirically with Macrobid  - May use Pyridium  for bladder spasms - Continue to push fluids.  - Seek in person evaluation for urine culture if symptoms do not improve or if they worsen.    Follow Up Instructions: I discussed the assessment and treatment plan with the patient. The patient was provided an opportunity to ask questions and all were answered. The patient agreed with the plan and demonstrated an  understanding of the instructions.  A copy of instructions were sent to the patient via MyChart unless otherwise noted below.    The patient was advised to call back or seek an in-person evaluation if the symptoms worsen or if the condition fails to improve as anticipated.    Delon CHRISTELLA Dickinson, PA-C

## 2023-11-01 ENCOUNTER — Ambulatory Visit (HOSPITAL_BASED_OUTPATIENT_CLINIC_OR_DEPARTMENT_OTHER): Attending: Orthopaedic Surgery | Admitting: Physical Therapy

## 2023-11-01 ENCOUNTER — Encounter (HOSPITAL_BASED_OUTPATIENT_CLINIC_OR_DEPARTMENT_OTHER): Payer: Self-pay | Admitting: Physical Therapy

## 2023-11-01 DIAGNOSIS — M25511 Pain in right shoulder: Secondary | ICD-10-CM | POA: Diagnosis present

## 2023-11-01 DIAGNOSIS — M6281 Muscle weakness (generalized): Secondary | ICD-10-CM | POA: Diagnosis present

## 2023-11-01 DIAGNOSIS — M25611 Stiffness of right shoulder, not elsewhere classified: Secondary | ICD-10-CM | POA: Diagnosis present

## 2023-11-01 NOTE — Therapy (Unsigned)
 OUTPATIENT PHYSICAL THERAPY SHOULDER TREATMENT   Patient Name: Tonya Suarez MRN: [id number] DOB:December 02, 1970, 53 y.o., female Today's Date: 11/02/2023  END OF SESSION:  PT End of Session - 11/01/23 1633     Visit Number 19    Number of Visits 28    Date for PT Re-Evaluation 11/16/23    Authorization Type [employer]    PT Start Time 1630    PT Stop Time 1717    PT Time Calculation (min) 47 min    Activity Tolerance Patient tolerated treatment well    Behavior During Therapy WFL for tasks assessed/performed                    Past Medical History:  Diagnosis Date   Abnormal Pap smear of cervix    ascus hpv hr neg   Allergy    Arthritis    Chronic headaches    Depression    Diabetes mellitus without complication (HCC)    GERD (gastroesophageal reflux disease)    Hypertension    Low back pain    UTI (urinary tract infection)    Past Surgical History:  Procedure Laterality Date   CERVICAL BIOPSY  W/ LOOP ELECTRODE EXCISION     COLPOSCOPY     CIN2/3   SHOULDER ARTHROSCOPY WITH DISTAL CLAVICLE RESECTION Right 06/12/2023   Procedure: RIGHT SHOULDER ARTHROSCOPY WITH DISTAL CLAVICLE RESECTION, SUBPECTORIAL TENODESIS;  Surgeon: Genelle Standing, MD;  Location: [facility];  Service: Orthopedics;  Laterality: Right;   SUBACROMIAL DECOMPRESSION Right 06/12/2023   Procedure: RIGHT SUBACROMIAL DECOMPRESSION;  Surgeon: Genelle Standing, MD;  Location: [facility];  Service: Orthopedics;  Laterality: Right;   TUBAL LIGATION     Patient Active Problem List   Diagnosis Date Noted   Impingement syndrome of right shoulder 06/12/2023   Arthritis of right acromioclavicular joint 06/12/2023   Biceps tendinitis of right upper extremity 06/12/2023   Hyperlipidemia associated with type 2 diabetes mellitus (HCC) 05/25/2021   Controlled diabetes mellitus (HCC) 05/13/2021   Recurrent major depressive disorder, in full remission (HCC) 05/11/2021   Bilateral  sciatica 12/08/2020   Mass of thigh, left 08/26/2020   Pain in right hand 09/27/2018   Former smoker 07/19/2018   Constipation 07/19/2018   B12 deficiency 07/19/2018   Restless legs 07/19/2018   Ingrown toenail 12/20/2016   History of adenomatous polyp of colon 09/23/2016   Migraines 05/06/2016   Family history of cancer of GI tract 12/06/2012   HTN (hypertension) 11/21/2011   MENORRHAGIA 09/23/2009   PLANTAR FASCIITIS 09/20/2007   Osteoarthritis 09/19/2006   Depression 09/06/2006   Allergic rhinitis 09/06/2006   GERD 09/06/2006     REFERRING PROVIDER: Genelle Standing, MD  REFERRING DIAG: M75.41 (ICD-10-CM) - Impingement syndrome of right shoulder  S/p Arthroscopic limited debridement  Arthroscopic distal clavicle excision  Arthroscopic subacromial decompression   Arthroscopic biceps tenodesis   THERAPY DIAG:  Stiffness of right shoulder, not elsewhere classified  Right shoulder pain, unspecified chronicity  Muscle weakness (generalized)  Rationale for Evaluation and Treatment: Rehabilitation  ONSET DATE: DOS  06/12/2023  SUBJECTIVE:  SUBJECTIVE STATEMENT: Pt is 20 weeks and 2 days post op.  Pt states she felt pretty good after prior Rx.  Pt saw MD yesterday.  She states MD was pleased with ROM and instructed pt to continue with PT.  Pt denies pain currently.    Hand dominance: Right  PERTINENT HISTORY: R shoulder arthroscopic biceps tenodesis, subacromial decompression, DCE, and limited debridement on 06/12/23  DM type 2, arthritis, LBP, HTN   PAIN:  Are you having pain? Yes NPRS:  0/10 current, 10/10 worst, 0/10 best Location: anterior and lateral R shoulder  PRECAUTIONS: Other: per surgical protocol    WEIGHT BEARING RESTRICTIONS: Yes R UE  FALLS:  Has patient fallen in  last 6 months? 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    PLOF: Independent  PATIENT GOALS: to be able sleep well without shoulder pain.  To perform activities without pain  NEXT MD VISIT:  06/28/23  OBJECTIVE:  Note: Objective measures were completed at Evaluation unless otherwise noted.  DIAGNOSTIC FINDINGS:  Pt is post op.  Pt had a MRI before surgery.                                                                                                                               TREATMENT:  11/01/23: UBE x 4 mins lvl 1  (2 mins each) Jobe's flexion 1# x 10, 2# 2x10 Standing scaption  2x10 2# Standing ER with YTB 3x10, IR 3x10 with RTB Shelf reach x 10 reps, 1# x10 reps 4D ball rolls on wall 2x10 Flexion AROM 2x10 in reclined position S/L shoulder abduction 2x10 propped on 2 pillows Pt unable to perform prone hz abd Prone shoulder extension 2x10  2#  8/21 Pulleys in flexion, scaption, and abd 2x10 each Jobe's flexion 0# x0, 1# 2x10  Standing scaption  2x10 2# Standing ER with YTB 3x10, IR 2x10 with YTB, 1x10 with RTB Rows with BTB 2x10 Shelf reach 2x10 Supine shoulder ABC x 1 reps  2# S/L ER x10, 1# x10 Flexion AROM 2x10 in reclined position Standing functional IR AROM 2x10  Pt received R shoulder PROM in flexion, abd, ER, and IR in supine.   8/19 Pulleys in flexion, scaption, and abd 2x10 each UBE x 3 mins lvl 1  Pt received R shoulder PROM in flexion, abd, ER, and IR in supine.   R shoulder Flexion in standing: AROM/AAROM:  133/145  Jobe's flexion 1# 2x10 Standing scaption x10, 1# x10 Standing ER/IR with arm at side  with YTB 2x10  PT updated HEP and pt a HEP handout.  PT educated pt in correct form and appropriate frequency.     8/14 Pulleys in flexion, scaption, and abd x20 each Flexion  AROM in reclined position 2x10 reps S/L shoulder abduction  2x10  Pt received R shoulder PROM in flexion, abd, ER, and IR in supine per pt and tissue tolerance.  Shoulder AROM/PROM: Flexion: supine:  138/148.  Standing AROM:  128 Abd:  NT/113 Scaption:  113 ER:  52/57 IR:  47/52  ER/IR with arm at side with YTB 2x10 each Rows with RTB 2x10 Standing wall walks in flexion  UEFI:  72/80   8/12 Pulleys in flexion, scaption, and abd x20 each Flexion AROM in reclined position 2x10 reps S/L shoulder abduction 2x10 S/L ER 2x10 Standing wand ER 2x10 Standing wand flexion 2x10 ER/IR with arm at side with YTB 2x10 each  ER AROM:  39 deg  Grade II-III AP and inf jt mobs to R GH jt Pt received R shoulder PROM in flexion, abd, ER, and IR in supine per pt and tissue tolerance.  PT updated HEP and pt a HEP handout.  PT educated pt in correct form and appropriate frequency.   8/5 Pulleys in flexion, scaption, and abd 2x10 each  R shoulder AROM: Flexion in standing:  134 deg ER:  45 deg  Standing wand flexion x10 in front of mirror Standing and scaption x10 in front of mirror Standing wand ER x10 Standing wand IR x 10 Standing functional IR AROM x10 reps Flexion AROM in reclined position 2x10 reps Supine shoulder ABC  2# x 1 rep S/L ER 2x10  Pt received R shoulder PROM in flexion, abd, ER, and IR in supine per pt and tissue tolerance.  PT updated HEP and pt a HEP handout.  PT educated pt in correct form and appropriate frequency.      PATIENT EDUCATION: Education details:  ROM findings, dx, HEP, POC, relevant anatomy, ice usage, and exercise form.  PT answered pt's questions. Person educated: Patient Education method: Explanation, Demonstration, Tactile cues, Verbal cues Education comprehension: verbalized understanding, returned demonstration, verbal cues required, tactile cues required, and needs further education  HOME EXERCISE PROGRAM: Access Code: [email or web address] URL:  [email or web address] Date: 06/16/2023 Prepared by: Mose Minerva  Updated HEP: - Standing Shoulder Flexion to 90 Degrees  - 1 x daily - 6-7 x weekly - 2 sets - 10 reps - Standing Shoulder Scaption  - 1 x daily - 6-7 x weekly - 2 sets - 10 reps - Shoulder External Rotation with Anchored Resistance  - 1 x daily - 3 x weekly - 2-3 sets - 10 reps - Shoulder Internal Rotation with Resistance  - 1 x daily - 3 x weekly - 2-3 sets - 10 reps    ASSESSMENT:  CLINICAL IMPRESSION: Pt presents to treatment reporting no pain.  PT worked on improving R shoulder and RTC strength, UE elevation, and function mobility of R UE.  PT added shelf reaching and pt performed well.  Pt performed standing flexion and scaption to 90 deg well with resistance.  She performed functional IR AROM and is limited with reaching behind back.  Pt has tightness with shoulder ROM.  She had some pain at end range PROM and tolerated PROM well.  She responded well to Rx having no c/o's and no increased pain after Rx.  She should benefit from cont skilled PT per protocol to improve ROM and strength, reduce pain, address goals, and to assist with returning to desired level of function.      OBJECTIVE IMPAIRMENTS: decreased ROM, decreased strength, hypomobility, impaired flexibility, impaired UE functional use, and pain.   ACTIVITY LIMITATIONS: carrying, lifting, bathing, dressing, reach over head, and hygiene/grooming  PARTICIPATION  LIMITATIONS: meal prep, cleaning, laundry, driving, shopping, community activity, and occupation  PERSONAL FACTORS: 1-2 comorbidities: DM type 2 arthritis are also affecting patient's functional outcome.   REHAB POTENTIAL: Good  CLINICAL DECISION MAKING: Stable/uncomplicated  EVALUATION COMPLEXITY: Low   GOALS:   SHORT TERM GOALS:   Pt will be independent with HEP for improved pain, ROM, and function.  Baseline: Goal status: GOA MET Target date:   07/12/2023  2.  Pt will wean  out of sling per MD instruction without adverse effects.  Baseline:  Goal status: GOAL MET Target date:   07/12/2023  3.  Pt will progress with PROM and AAROM per protocol without adverse effects for improved shoulder mobility and function. Baseline:  Goal status:  PROGRESSING Target date:   07/12/2023  4.  Pt will demo R shoulder PROM/AAROM to be at least 140/120 deg in flexion (supine) and 50/45 deg in ER for improved shoulder mobility and stiffness. Baseline:  Goal status:GOAL MET Target date:   07/26/2023   5.  Pt will demo at least 100 deg of R shoulder elevation in standing without significant UT compensatory hike.   Baseline:  Goal status: PARTIALLY MET Target date:   08/09/2023  6.  Ptwill be able to perform her self care activities with no > than minimal difficulty.  Baseline:  Goal status: GOAL MET  7/1 Target date:   09/06/2023   LONG TERM GOALS: Target date:  11/16/2023      Pt will demo R shoulder AROM to be [facility] t/o for performance of ADLs and IADLs. Baseline:  Goal status: PROGRESSING  2.  Pt will be able to perform her ADLs and IADLs including household chores without significant difficulty and pain.  Baseline:  Goal status: PROGRESSING  3.  Pt will be able to perform her normal reaching and overhead activities without significant limitation and pain.  Baseline:  Goal status:  ONGOING  4.  Pt will return to work with expected limitations without adverse effects.   Baseline:  Goal status: PROGRESSING  5.  Pt will demo R shoulder strength to be at least 4+/5 MMT in flexion, ER, and IR and 4/5 strength in abduction for performance of work activities and functional lifting/carrying.   Baseline:  Goal status: PROGRESSING   PLAN:  PT FREQUENCY: 2x/wk   PT DURATION: 6 weeks  PLANNED INTERVENTIONS: 97164- PT Re-evaluation, 97750- Physical Performance Testing, 97110-Therapeutic exercises, 97530- Therapeutic activity, [id number]- Neuromuscular re-education, 97535-  Self Care, [id number]- Manual therapy, [id number]- Aquatic Therapy, H9716- Electrical stimulation (unattended), [id number]- Electrical stimulation (manual), 97035- Ultrasound, Patient/Family education, Taping, Dry Needling, Joint mobilization, Spinal mobilization, Scar mobilization, Cryotherapy, and Moist heat  PLAN FOR NEXT SESSION: cont per biceps tenodesis protocol.  Work on improving ROM.  A  Leigh Minerva III PT, DPT 11/02/23 5:10 PM

## 2023-11-04 ENCOUNTER — Encounter (HOSPITAL_BASED_OUTPATIENT_CLINIC_OR_DEPARTMENT_OTHER): Payer: Self-pay | Admitting: Physical Therapy

## 2023-11-04 ENCOUNTER — Ambulatory Visit (HOSPITAL_BASED_OUTPATIENT_CLINIC_OR_DEPARTMENT_OTHER): Admitting: Physical Therapy

## 2023-11-04 NOTE — Therapy (Deleted)
 OUTPATIENT PHYSICAL THERAPY SHOULDER TREATMENT   Patient Name: Tonya Suarez MRN: [id number] DOB:May 21, 1970, 53 y.o., female Today's Date: 11/04/2023  END OF SESSION:              Past Medical History:  Diagnosis Date   Abnormal Pap smear of cervix    ascus hpv hr neg   Allergy    Arthritis    Chronic headaches    Depression    Diabetes mellitus without complication (HCC)    GERD (gastroesophageal reflux disease)    Hypertension    Low back pain    UTI (urinary tract infection)    Past Surgical History:  Procedure Laterality Date   CERVICAL BIOPSY  W/ LOOP ELECTRODE EXCISION     COLPOSCOPY     CIN2/3   SHOULDER ARTHROSCOPY WITH DISTAL CLAVICLE RESECTION Right 06/12/2023   Procedure: RIGHT SHOULDER ARTHROSCOPY WITH DISTAL CLAVICLE RESECTION, SUBPECTORIAL TENODESIS;  Surgeon: Genelle Standing, MD;  Location: [facility];  Service: Orthopedics;  Laterality: Right;   SUBACROMIAL DECOMPRESSION Right 06/12/2023   Procedure: RIGHT SUBACROMIAL DECOMPRESSION;  Surgeon: Genelle Standing, MD;  Location: [facility];  Service: Orthopedics;  Laterality: Right;   TUBAL LIGATION     Patient Active Problem List   Diagnosis Date Noted   Impingement syndrome of right shoulder 06/12/2023   Arthritis of right acromioclavicular joint 06/12/2023   Biceps tendinitis of right upper extremity 06/12/2023   Hyperlipidemia associated with type 2 diabetes mellitus (HCC) 05/25/2021   Controlled diabetes mellitus (HCC) 05/13/2021   Recurrent major depressive disorder, in full remission (HCC) 05/11/2021   Bilateral sciatica 12/08/2020   Mass of thigh, left 08/26/2020   Pain in right hand 09/27/2018   Former smoker 07/19/2018   Constipation 07/19/2018   B12 deficiency 07/19/2018   Restless legs 07/19/2018   Ingrown toenail 12/20/2016   History of adenomatous polyp of colon 09/23/2016   Migraines 05/06/2016   Family history of cancer of GI tract 12/06/2012    HTN (hypertension) 11/21/2011   MENORRHAGIA 09/23/2009   PLANTAR FASCIITIS 09/20/2007   Osteoarthritis 09/19/2006   Depression 09/06/2006   Allergic rhinitis 09/06/2006   GERD 09/06/2006     REFERRING PROVIDER: Genelle Standing, MD  REFERRING DIAG: M75.41 (ICD-10-CM) - Impingement syndrome of right shoulder  S/p Arthroscopic limited debridement  Arthroscopic distal clavicle excision  Arthroscopic subacromial decompression   Arthroscopic biceps tenodesis   THERAPY DIAG:  No diagnosis found.  Rationale for Evaluation and Treatment: Rehabilitation  ONSET DATE: DOS  06/12/2023  SUBJECTIVE:  SUBJECTIVE STATEMENT: Pt is 20 weeks and 2 days post op.  Pt denies pain currently.  She had no adverse effects after prior treatment.     Hand dominance: Right  PERTINENT HISTORY: R shoulder arthroscopic biceps tenodesis, subacromial decompression, DCE, and limited debridement on 06/12/23  DM type 2, arthritis, LBP, HTN   PAIN:  Are you having pain? Yes NPRS:  0/10 current, 10/10 worst, 0/10 best Location: anterior and lateral R shoulder  PRECAUTIONS: Other: per surgical protocol    WEIGHT BEARING RESTRICTIONS: Yes R UE  FALLS:  Has patient fallen in last 6 months? 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    PLOF: Independent  PATIENT GOALS: to be able sleep well without shoulder pain.  To perform activities without pain  NEXT MD VISIT:  06/28/23  OBJECTIVE:  Note: Objective measures were completed at Evaluation unless otherwise noted.  DIAGNOSTIC FINDINGS:  Pt is post op.  Pt had a MRI before surgery.                                                                                                                                TREATMENT:  11/01/23: UBE x 4 mins lvl 1  (2 mins each) Jobe's flexion 1# x 10, 2# 2x10 Standing scaption  2x10 2# Standing ER with YTB 3x10, IR 3x10 with RTB Shelf reach x 10 reps, 1# x10 reps 4D ball rolls on wall 2x10 Flexion AROM 2x10 in reclined position S/L shoulder abduction 2x10 propped on 2 pillows Pt unable to perform prone hz abd Prone shoulder extension 2x10  2#  Pt received R shoulder PROM in flexion, abd (with manual distraction), ER, and IR in supine.  8/21 Pulleys in flexion, scaption, and abd 2x10 each Jobe's flexion 0# x0, 1# 2x10  Standing scaption  2x10 2# Standing ER with YTB 3x10, IR 2x10 with YTB, 1x10 with RTB Rows with BTB 2x10 Shelf reach 2x10 Supine shoulder ABC x 1 reps  2# S/L ER x10, 1# x10 Flexion AROM 2x10 in reclined position Standing functional IR AROM 2x10  Pt received R shoulder PROM in flexion, abd, ER, and IR in supine.   8/19 Pulleys in flexion, scaption, and abd 2x10 each UBE x 3 mins lvl 1  Pt received R shoulder PROM in flexion, abd, ER, and IR in supine.   R shoulder Flexion in standing: AROM/AAROM:  133/145  Jobe's flexion 1# 2x10 Standing scaption x10, 1# x10 Standing ER/IR with arm at side  with YTB 2x10  PT updated HEP and pt a HEP handout.  PT educated pt in correct form and appropriate frequency.     8/14 Pulleys in flexion, scaption, and abd x20 each Flexion AROM in reclined position 2x10 reps S/L shoulder abduction 2x10  Pt received  R shoulder PROM in flexion, abd, ER, and IR in supine per pt and tissue tolerance.  Shoulder AROM/PROM: Flexion: supine:  138/148.  Standing AROM:  128 Abd:  NT/113 Scaption:  113 ER:  52/57 IR:  47/52  ER/IR with arm at side with YTB 2x10 each Rows with RTB 2x10 Standing wall walks in flexion  UEFI:  72/80   8/12 Pulleys in flexion, scaption, and abd x20 each Flexion AROM  in reclined position 2x10 reps S/L shoulder abduction 2x10 S/L ER 2x10 Standing wand ER 2x10 Standing wand flexion 2x10 ER/IR with arm at side with YTB 2x10 each  ER AROM:  39 deg  Grade II-III AP and inf jt mobs to R GH jt Pt received R shoulder PROM in flexion, abd, ER, and IR in supine per pt and tissue tolerance.  PT updated HEP and pt a HEP handout.  PT educated pt in correct form and appropriate frequency.   8/5 Pulleys in flexion, scaption, and abd 2x10 each  R shoulder AROM: Flexion in standing:  134 deg ER:  45 deg  Standing wand flexion x10 in front of mirror Standing and scaption x10 in front of mirror Standing wand ER x10 Standing wand IR x 10 Standing functional IR AROM x10 reps Flexion AROM in reclined position 2x10 reps Supine shoulder ABC  2# x 1 rep S/L ER 2x10  Pt received R shoulder PROM in flexion, abd, ER, and IR in supine per pt and tissue tolerance.  PT updated HEP and pt a HEP handout.  PT educated pt in correct form and appropriate frequency.      PATIENT EDUCATION: Education details:  dx, HEP, POC, relevant anatomy, ice usage, and exercise form.  PT answered pt's questions. Person educated: Patient Education method: Explanation, Demonstration, Tactile cues, Verbal cues Education comprehension: verbalized understanding, returned demonstration, verbal cues required, tactile cues required, and needs further education  HOME EXERCISE PROGRAM: Access Code: [email or web address] URL: [email or web address] Date: 06/16/2023 Prepared by: Mose Minerva    ASSESSMENT:  CLINICAL IMPRESSION: PT worked on improving R shoulder and RTC strength, UE elevation, and function mobility of R UE.  Pt performed flexion and abduction AROM in gravity minimized positions to improve UE elevation.  She is still limited with UE elevation.  Pt has tightness in shoulder PROM and tolerated abduction better with manual distraction.  Pt is improving with R UE strength.   PT attempted prone hz abd though pt unable to perform having very little range of motion.   She responded well to Rx having no c/o's and no increased pain after Rx.  She should benefit from cont skilled PT per protocol to improve ROM and strength, reduce pain, address goals, and to assist with returning to desired level of function.      OBJECTIVE IMPAIRMENTS: decreased ROM, decreased strength, hypomobility, impaired flexibility, impaired UE functional use, and pain.   ACTIVITY LIMITATIONS: carrying, lifting, bathing, dressing, reach over head, and hygiene/grooming  PARTICIPATION LIMITATIONS: meal prep, cleaning, laundry, driving, shopping, community activity, and occupation  PERSONAL FACTORS: 1-2 comorbidities: DM type 2 arthritis are also affecting patient's functional outcome.   REHAB POTENTIAL: Good  CLINICAL DECISION MAKING: Stable/uncomplicated  EVALUATION COMPLEXITY: Low   GOALS:   SHORT TERM GOALS:   Pt will be independent with HEP for improved pain, ROM, and function.  Baseline: Goal status: GOA MET Target date:   07/12/2023  2.  Pt will wean out of sling per MD instruction without adverse effects.  Baseline:  Goal  status: GOAL MET Target date:   07/12/2023  3.  Pt will progress with PROM and AAROM per protocol without adverse effects for improved shoulder mobility and function. Baseline:  Goal status:  PROGRESSING Target date:   07/12/2023  4.  Pt will demo R shoulder PROM/AAROM to be at least 140/120 deg in flexion (supine) and 50/45 deg in ER for improved shoulder mobility and stiffness. Baseline:  Goal status:GOAL MET Target date:   07/26/2023   5.  Pt will demo at least 100 deg of R shoulder elevation in standing without significant UT compensatory hike.   Baseline:  Goal status: PARTIALLY MET Target date:   08/09/2023  6.  Ptwill be able to perform her self care activities with no > than minimal difficulty.  Baseline:  Goal status: GOAL MET  7/1 Target  date:   09/06/2023   LONG TERM GOALS: Target date:  11/16/2023      Pt will demo R shoulder AROM to be [facility] t/o for performance of ADLs and IADLs. Baseline:  Goal status: PROGRESSING  2.  Pt will be able to perform her ADLs and IADLs including household chores without significant difficulty and pain.  Baseline:  Goal status: PROGRESSING  3.  Pt will be able to perform her normal reaching and overhead activities without significant limitation and pain.  Baseline:  Goal status:  ONGOING  4.  Pt will return to work with expected limitations without adverse effects.   Baseline:  Goal status: PROGRESSING  5.  Pt will demo R shoulder strength to be at least 4+/5 MMT in flexion, ER, and IR and 4/5 strength in abduction for performance of work activities and functional lifting/carrying.   Baseline:  Goal status: PROGRESSING   PLAN:  PT FREQUENCY: 2x/wk   PT DURATION: 6 weeks  PLANNED INTERVENTIONS: 97164- PT Re-evaluation, 97750- Physical Performance Testing, 97110-Therapeutic exercises, 97530- Therapeutic activity, [id number]- Neuromuscular re-education, 97535- Self Care, 02859- Manual therapy, [id number]- Aquatic Therapy, H9716- Electrical stimulation (unattended), [id number]- Electrical stimulation (manual), 97035- Ultrasound, Patient/Family education, Taping, Dry Needling, Joint mobilization, Spinal mobilization, Scar mobilization, Cryotherapy, and Moist heat  PLAN FOR NEXT SESSION: cont per biceps tenodesis protocol.  Cont with strengthening, stabilization, and ROM.    Rojean Batten PT, DPT 11/04/23  7:38 AM

## 2023-11-06 ENCOUNTER — Encounter (INDEPENDENT_AMBULATORY_CARE_PROVIDER_SITE_OTHER): Admitting: Podiatry

## 2023-11-06 DIAGNOSIS — Z91199 Patient's noncompliance with other medical treatment and regimen due to unspecified reason: Secondary | ICD-10-CM

## 2023-11-07 ENCOUNTER — Ambulatory Visit (HOSPITAL_BASED_OUTPATIENT_CLINIC_OR_DEPARTMENT_OTHER): Admitting: Physical Therapy

## 2023-11-08 NOTE — Progress Notes (Signed)
 Patient no-showed for scheduled appt on 11/06/23

## 2023-11-10 ENCOUNTER — Encounter (HOSPITAL_BASED_OUTPATIENT_CLINIC_OR_DEPARTMENT_OTHER): Payer: Self-pay | Admitting: Physical Therapy

## 2023-11-21 ENCOUNTER — Ambulatory Visit (HOSPITAL_BASED_OUTPATIENT_CLINIC_OR_DEPARTMENT_OTHER): Admitting: Physical Therapy

## 2023-11-29 ENCOUNTER — Ambulatory Visit (HOSPITAL_BASED_OUTPATIENT_CLINIC_OR_DEPARTMENT_OTHER): Admitting: Physical Therapy

## 2023-12-06 ENCOUNTER — Other Ambulatory Visit: Payer: Self-pay | Admitting: Family Medicine

## 2023-12-13 ENCOUNTER — Ambulatory Visit (INDEPENDENT_AMBULATORY_CARE_PROVIDER_SITE_OTHER): Admitting: Orthopaedic Surgery

## 2023-12-13 DIAGNOSIS — M25552 Pain in left hip: Secondary | ICD-10-CM

## 2023-12-13 NOTE — Progress Notes (Signed)
 Post Operative Evaluation    Procedure/Date of Surgery: Right shoulder arthroscopy with distal clavicle resection and subacromial decompression  Interval History:   Presents today status post above procedure.  Overall she does feel much better after her most recent injection.  Her left hip is still continuing to be painful.  She did get extremely good relief from her initial injection which has now worn off PMH/PSH/Family History/Social History/Meds/Allergies:    Past Medical History:  Diagnosis Date   Abnormal Pap smear of cervix    ascus hpv hr neg   Allergy    Arthritis    Chronic headaches    Depression    Diabetes mellitus without complication (HCC)    GERD (gastroesophageal reflux disease)    Hypertension    Low back pain    UTI (urinary tract infection)    Past Surgical History:  Procedure Laterality Date   CERVICAL BIOPSY  W/ LOOP ELECTRODE EXCISION     COLPOSCOPY     CIN2/3   SHOULDER ARTHROSCOPY WITH DISTAL CLAVICLE RESECTION Right 06/12/2023   Procedure: RIGHT SHOULDER ARTHROSCOPY WITH DISTAL CLAVICLE RESECTION, SUBPECTORIAL TENODESIS;  Surgeon: Genelle Standing, MD;  Location: [facility];  Service: Orthopedics;  Laterality: Right;   SUBACROMIAL DECOMPRESSION Right 06/12/2023   Procedure: RIGHT SUBACROMIAL DECOMPRESSION;  Surgeon: Genelle Standing, MD;  Location: [facility];  Service: Orthopedics;  Laterality: Right;   TUBAL LIGATION     Social History   Socioeconomic History   Marital status: Married    Spouse name: Not on file   Number of children: Not on file   Years of education: Not on file   Highest education level: Not on file  Occupational History   Not on file  Tobacco Use   Smoking status: Former    Types: Cigars   Smokeless tobacco: Never  Vaping Use   Vaping status: Never Used  Substance and Sexual Activity   Alcohol use: Yes    Alcohol/week: 0.0 - 2.0 standard drinks of alcohol     Comment: occ   Drug use: No   Sexual activity: Yes    Partners: Male    Birth control/protection: Surgical, Post-menopausal    Comment: tubal ligation  Other Topics Concern   Not on file  Social History Narrative   Married May 13th 2017. 2 children- son 59 (married) and daughter 8. Granddaughter august 2017.       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      Hobbies: time with family   Social Drivers of Corporate investment banker Strain: Not on file  Food Insecurity: Not on file  Transportation Needs: Not on file  Physical Activity: Not on file  Stress: Not on file  Social Connections: Unknown (08/03/2021)   Received from [employer]   Social Network    Social Network: Not on file   Family History  Problem Relation Age of Onset   Colon polyps Mother    Diabetes Mother    Hypertension Mother    Hyperlipidemia Mother    Kidney disease Mother        has fistula- could need dialysis at any time   Colon polyps Father    Diabetes Father        mother   Hyperlipidemia  Father        mother   Hypertension Father        mother   Colon cancer Father        35s, and grandmother   Lung cancer Father        in 20s   Deep vein thrombosis Father        not during dtime of cancer. also paternal aunts reported   Cancer - Other Sister        duodenal   Stomach cancer Sister    Colon cancer Paternal Grandmother    Colon cancer Maternal Aunt    Esophageal cancer Neg Hx    Liver cancer Neg Hx    Pancreatic cancer Neg Hx    Rectal cancer Neg Hx    Allergies  Allergen Reactions   Amoxicillin Swelling    Lip swelling   Nsaids     bleeding   Current Outpatient Medications  Medication Sig Dispense Refill   amLODipine  (NORVASC ) 2.5 MG tablet TAKE 1 TABLET BY MOUTH EVERY DAY 90 tablet 3   aspirin  EC 325 MG tablet Take 1 tablet (325 mg total) by mouth daily. 14 tablet 0   azelastine  (ASTELIN ) 0.1 % nasal spray Place 2 sprays into both  nostrils 2 (two) times daily. 90 mL 3   clotrimazole -betamethasone  (LOTRISONE ) cream APPLY TO AFFECTED AREA TWICE A DAY 45 g 1   Continuous Blood Gluc Sensor (FREESTYLE LIBRE 3 SENSOR) MISC Quantity: 2 sensors/month NDC# [phone] Sensor Refills: PRN or 12 refills annually 2 each 12   diclofenac  Sodium (VOLTAREN ) 1 % GEL Apply 4 g topically 4 (four) times daily. To affected joint. 100 g 11   doxycycline  (VIBRA -TABS) 100 MG tablet Take 1 tablet (100 mg total) by mouth 2 (two) times daily. 20 tablet 1   fluticasone  (FLONASE ) 50 MCG/ACT nasal spray SPRAY 2 SPRAYS INTO EACH NOSTRIL EVERY DAY 48 mL 3   gabapentin  (NEURONTIN ) 300 MG capsule TAKE 2 CAPSULES BY MOUTH AT BEDTIME 180 capsule 1   hydrochlorothiazide  (HYDRODIURIL ) 25 MG tablet TAKE 1 TABLET (25 MG TOTAL) BY MOUTH DAILY. 90 tablet 3   ipratropium (ATROVENT ) 0.03 % nasal spray PLACE 2 SPRAYS INTO BOTH NOSTRILS EVERY 12 (TWELVE) HOURS. 90 mL 2   meloxicam  (MOBIC ) 7.5 MG tablet TAKE 1 TABLET (7.5 MG TOTAL) BY MOUTH DAILY AS NEEDED. FOR PAIN 90 tablet 1   nitrofurantoin , macrocrystal-monohydrate, (MACROBID ) 100 MG capsule Take 1 capsule (100 mg total) by mouth 2 (two) times daily. 10 capsule 0   oxyCODONE  (ROXICODONE ) 5 MG immediate release tablet Take 1 tablet (5 mg total) by mouth every 4 (four) hours as needed for severe pain (pain score 7-10) or breakthrough pain. 15 tablet 0   pantoprazole  (PROTONIX ) 40 MG tablet TAKE 1 TABLET BY MOUTH EVERY DAY 90 tablet 3   phenazopyridine  (PYRIDIUM ) 100 MG tablet Take 1 tablet (100 mg total) by mouth 3 (three) times daily as needed for pain. 10 tablet 0   rosuvastatin  (CRESTOR ) 10 MG tablet TAKE 1 TABLET (10 MG TOTAL) BY MOUTH ONCE A WEEK. 13 tablet 3   Semaglutide ,0.25 or 0.5MG /DOS, (OZEMPIC , 0.25 OR 0.5 MG/DOSE,) 2 MG/3ML SOPN INJECT 0.25 MG AS DIRECTED ONCE A WEEK FOR 28 DAYS, THEN 0.5 MG ONCE A WEEK FOR 28 DAYS 3 mL 5   tiZANidine  (ZANAFLEX ) 4 MG tablet TAKE 0.5-1 TABLET (2-4 MG TOTAL) BY MOUTH EVERY  8 (EIGHT) HOURS AS NEEDED FOR MUSCLE SPASMS. 30 tablet 2   No current facility-administered  medications for this visit.   No results found.  Review of Systems:   A ROS was performed including pertinent positives and negatives as documented in the HPI.   Musculoskeletal Exam:    Right shoulder incisions are well-appearing without erythema or drainage.  There is a small rash about the anterior aspect of the elbow.  In the spine position she can forward elevate to 90 degrees actively with external rotation at side to 30.  Distal neurosensory exam is intact   Left hip with a positive FADIR with tenderness about the femoral acetabular joint.  30 degrees internal rotation and 90 degrees of flexion with pain 45 degrees external rotation without pain.  Good abduction strength  Imaging:      I personally reviewed and interpreted the radiographs.   Assessment:    status post right shoulder distal clavicle resection subacromial decompression overall doing well.  Range of motion is improving nicely after injection.  With regard to the left hip she does have evidence of possible labral tearing and likely gluteal tearing.  Given this and the fact she has now failed an injection we will plan for an MRI of left hip and follow-up discuss results  Plan :    - Plan for MRI left hip and follow-up discuss results          I personally saw and evaluated the patient, and participated in the management and treatment plan.  Tonya Parker, MD Attending Physician, Orthopedic Surgery  This document was dictated using Dragon voice recognition software. A reasonable attempt at proof reading has been made to minimize errors.

## 2023-12-25 ENCOUNTER — Encounter: Payer: Self-pay | Admitting: Radiology

## 2023-12-28 ENCOUNTER — Encounter (HOSPITAL_BASED_OUTPATIENT_CLINIC_OR_DEPARTMENT_OTHER): Payer: Self-pay | Admitting: Orthopaedic Surgery

## 2023-12-31 ENCOUNTER — Other Ambulatory Visit: Payer: Self-pay | Admitting: Family Medicine

## 2024-01-01 ENCOUNTER — Ambulatory Visit: Payer: Self-pay

## 2024-01-01 ENCOUNTER — Encounter: Payer: Self-pay | Admitting: Physician Assistant

## 2024-01-01 ENCOUNTER — Ambulatory Visit: Admitting: Physician Assistant

## 2024-01-01 VITALS — BP 122/84 | HR 88 | Temp 97.1°F | Ht 70.0 in | Wt 225.8 lb

## 2024-01-01 DIAGNOSIS — J020 Streptococcal pharyngitis: Secondary | ICD-10-CM | POA: Diagnosis not present

## 2024-01-01 DIAGNOSIS — J069 Acute upper respiratory infection, unspecified: Secondary | ICD-10-CM

## 2024-01-01 LAB — POC COVID19 BINAXNOW: SARS Coronavirus 2 Ag: NEGATIVE

## 2024-01-01 LAB — POCT INFLUENZA A/B
Influenza A, POC: NEGATIVE
Influenza B, POC: NEGATIVE

## 2024-01-01 LAB — POCT RAPID STREP A (OFFICE): Rapid Strep A Screen: POSITIVE — AB

## 2024-01-01 MED ORDER — PREDNISONE 20 MG PO TABS: 40.0000 mg | ORAL_TABLET | Freq: Every day | ORAL | 0 refills | Status: AC

## 2024-01-01 MED ORDER — AZITHROMYCIN 250 MG PO TABS
ORAL_TABLET | ORAL | 0 refills | Status: AC
Start: 2024-01-01 — End: 2024-01-06

## 2024-01-01 MED ORDER — ALBUTEROL SULFATE HFA 108 (90 BASE) MCG/ACT IN AERS: 2.0000 | INHALATION_SPRAY | Freq: Four times a day (QID) | RESPIRATORY_TRACT | 2 refills | Status: DC | PRN

## 2024-01-01 NOTE — Telephone Encounter (Signed)
 FYI Only or Action Required?: FYI only for provider: appointment scheduled on today.  Patient was last seen in primary care on 10/27/2023 by Vivienne Delon HERO, PA-C.  Called Nurse Triage reporting Shortness of Breath.  Symptoms began today.  Interventions attempted: Nothing.  Symptoms are: unchanged.  Triage Disposition: See HCP Within 4 Hours (Or PCP Triage)  Patient/caregiver understands and will follow disposition?: Yes, will follow disposition  Copied from CRM [id number]. Topic: Clinical - Red Word Triage >> Jan 01, 2024  8:08 AM Berneda FALCON wrote: Red Word that prompted transfer to Nurse Triage: Patient states she has been vomiting, sore throat, wheezing, and shortness of breath since last night. Reason for Disposition  [1] MILD difficulty breathing (e.g., minimal/no SOB at rest, SOB with walking, pulse < 100) AND [2] NEW-onset or WORSE than normal  Answer Assessment - Initial Assessment Questions 1. RESPIRATORY STATUS: Describe your breathing? (e.g., wheezing, shortness of breath, unable to speak, severe coughing)      SOB, pt also confirms wheezing 2. ONSET: When did this breathing problem begin?      Last night 3. PATTERN Does the difficult breathing come and go, or has it been constant since it started?      constant 4. SEVERITY: How bad is your breathing? (e.g., mild, moderate, severe)      mild 5. RECURRENT SYMPTOM: Have you had difficulty breathing before? If Yes, ask: When was the last time? and What happened that time?      denies 6. CARDIAC HISTORY: Do you have any history of heart disease? (e.g., heart attack, angina, bypass surgery, angioplasty)      denies 7. LUNG HISTORY: Do you have any history of lung disease?  (e.g., pulmonary embolus, asthma, emphysema)     denies 8. CAUSE: What do you think is causing the breathing problem?      Unsure, states that she is having a sore throat and vomiting 9. OTHER SYMPTOMS: Do you have any other  symptoms? (e.g., chest pain, cough, dizziness, fever, runny nose)     States sore throat, denies fever 10. O2 SATURATION MONITOR:  Do you use an oxygen saturation monitor (pulse oximeter) at home? If Yes, ask: What is your reading (oxygen level) today? What is your usual oxygen saturation reading? (e.g., 95%)       Denies having equip 12. TRAVEL: Have you traveled out of the country in the last month? (e.g., travel history, exposures)       denies  Pt states that throat is sore with palpation, states that it is not externally swollen. Denies long trips recently. Pt speaking in full sentences.  Protocols used: Breathing Difficulty-A-AH

## 2024-01-01 NOTE — Telephone Encounter (Signed)
 Patient scheduled to see Tonya Suarez today. Dr. Katrinka will review triage notes.

## 2024-01-01 NOTE — Progress Notes (Signed)
 Patient ID: Tonya Suarez, female    DOB: 08/26/1970, 53 y.o.   MRN: [id number]   Assessment & Plan:  Strep pharyngitis -     POCT rapid strep A -     POC COVID-19 BinaxNow -     POCT Influenza A/B  Acute URI -     POC COVID-19 BinaxNow -     POCT Influenza A/B  Other orders -     Azithromycin ; Take 2 tablets on day 1, then 1 tablet daily on days 2 through 5  Dispense: 6 tablet; Refill: 0 -     predniSONE ; Take 2 tablets (40 mg total) by mouth daily with breakfast for 5 days.  Dispense: 10 tablet; Refill: 0 -     Albuterol  Sulfate HFA; Inhale 2 puffs into the lungs every 6 (six) hours as needed for wheezing or shortness of breath.  Dispense: 8 g; Refill: 2     Assessment & Plan Acute pharyngitis with cough, wheezing, and shortness of breath Acute onset of pharyngitis with associated cough, wheezing, and shortness of breath. Symptoms began last night with throat irritation, progressing to chills and cough. Negative for flu and COVID, but rapid strep is positive. - Prescribed azithromycin  (Z-Pak) - Prescribed prednisone  40 mg in the morning with breakfast to reduce inflammation and open airways. - Prescribed albuterol  inhaler to be used at least four times a day to alleviate wheezing and congestion. - Advised rest and increased fluid intake. - Recommended retesting for flu and COVID in a few days if symptoms persist. - Provided work note for absence on November 11th and 12th, with potential extension if symptoms do not improve.      F/up prn     Subjective:    Chief Complaint  Patient presents with   Sore Throat    Pt in office c/o sore throat, emesis, and congestion with cough and sob. Pt was fine when going to bed last night but overnight this morning symptoms started;     Sore Throat    Discussed the use of AI scribe software for clinical note transcription with the patient, who gave verbal consent to proceed.  History of Present Illness Tonya Suarez is a 53 year old female who presents with acute onset of cough, chills, and sore throat.  She experienced the sudden onset of symptoms last night, beginning with an irritated throat, followed by coughing and chills. This morning, she continued to feel unwell and attempted to alleviate her symptoms with ginger ale, but did not experience improvement. She took Advil  Cold and Flu for symptom relief today.  She denies recent contact with sick individuals, except for her grandchildren two weeks ago, and has not traveled recently. She mentions a bug circulating at her workplace. She normally receives a flu shot but has not yet done so this year.  No history of asthma or breathing issues, but she feels winded today. She has not experienced wheezing before.  She has a history of diabetes and was previously on metformin .     Past Medical History:  Diagnosis Date   Abnormal Pap smear of cervix    ascus hpv hr neg   Allergy    Arthritis    Chronic headaches    Depression    Diabetes mellitus without complication (HCC)    GERD (gastroesophageal reflux disease)    Hypertension    Low back pain    UTI (urinary tract infection)     Past Surgical  History:  Procedure Laterality Date   CERVICAL BIOPSY  W/ LOOP ELECTRODE EXCISION     COLPOSCOPY     CIN2/3   SHOULDER ARTHROSCOPY WITH DISTAL CLAVICLE RESECTION Right 06/12/2023   Procedure: RIGHT SHOULDER ARTHROSCOPY WITH DISTAL CLAVICLE RESECTION, SUBPECTORIAL TENODESIS;  Surgeon: Genelle Standing, MD;  Location: [facility];  Service: Orthopedics;  Laterality: Right;   SUBACROMIAL DECOMPRESSION Right 06/12/2023   Procedure: RIGHT SUBACROMIAL DECOMPRESSION;  Surgeon: Genelle Standing, MD;  Location: [facility];  Service: Orthopedics;  Laterality: Right;   TUBAL LIGATION      Family History  Problem Relation Age of Onset   Colon polyps Mother    Diabetes Mother    Hypertension Mother    Hyperlipidemia Mother     Kidney disease Mother        has fistula- could need dialysis at any time   Colon polyps Father    Diabetes Father        mother   Hyperlipidemia Father        mother   Hypertension Father        mother   Colon cancer Father        69s, and grandmother   Lung cancer Father        in 32s   Deep vein thrombosis Father        not during dtime of cancer. also paternal aunts reported   Cancer - Other Sister        duodenal   Stomach cancer Sister    Colon cancer Paternal Grandmother    Colon cancer Maternal Aunt    Esophageal cancer Neg Hx    Liver cancer Neg Hx    Pancreatic cancer Neg Hx    Rectal cancer Neg Hx     Social History   Tobacco Use   Smoking status: Former    Types: Cigars   Smokeless tobacco: Never  Vaping Use   Vaping status: Never Used  Substance Use Topics   Alcohol use: Yes    Alcohol/week: 0.0 - 2.0 standard drinks of alcohol    Comment: occ   Drug use: No     Allergies  Allergen Reactions   Amoxicillin Swelling    Lip swelling   Nsaids     bleeding    Review of Systems NEGATIVE UNLESS OTHERWISE INDICATED IN HPI      Objective:     BP 122/84 (BP Location: Left Arm, Patient Position: Sitting, Cuff Size: Normal)   Pulse 88   Temp (!) 97.1 F (36.2 C) (Temporal)   Ht 5' 10 (1.778 m)   Wt 225 lb 12.8 oz (102.4 kg)   LMP 06/05/2018 Comment: BTL  SpO2 98%   BMI 32.40 kg/m   Wt Readings from Last 3 Encounters:  01/01/24 225 lb 12.8 oz (102.4 kg)  08/02/23 179 lb (81.2 kg)  07/24/23 179 lb (81.2 kg)    BP Readings from Last 3 Encounters:  01/01/24 122/84  06/12/23 99/64  05/18/23 112/78     Physical Exam Vitals and nursing note reviewed.  Constitutional:      General: She is not in acute distress.    Appearance: Normal appearance. She is not ill-appearing.  HENT:     Head: Normocephalic.     Right Ear: Tympanic membrane, ear canal and external ear normal.     Left Ear: Tympanic membrane, ear canal and external ear  normal.     Nose: Congestion present.  Mouth/Throat:     Mouth: Mucous membranes are moist.     Pharynx: Posterior oropharyngeal erythema present. No oropharyngeal exudate.  Eyes:     Extraocular Movements: Extraocular movements intact.     Conjunctiva/sclera: Conjunctivae normal.     Pupils: Pupils are equal, round, and reactive to light.  Cardiovascular:     Rate and Rhythm: Normal rate and regular rhythm.     Pulses: Normal pulses.     Heart sounds: Normal heart sounds. No murmur heard. Pulmonary:     Effort: Pulmonary effort is normal. No respiratory distress.     Breath sounds: Wheezing present.  Musculoskeletal:     Cervical back: Normal range of motion.  Skin:    General: Skin is warm.  Neurological:     Mental Status: She is alert and oriented to person, place, and time.  Psychiatric:        Mood and Affect: Mood normal.        Behavior: Behavior normal.             Akacia Boltz M Michiko Lineman, PA-C

## 2024-01-03 ENCOUNTER — Ambulatory Visit
Admission: RE | Admit: 2024-01-03 | Discharge: 2024-01-03 | Disposition: A | Discharge status: Home / Self Care | Source: Ambulatory Visit | Attending: Orthopaedic Surgery | Admitting: Orthopaedic Surgery

## 2024-01-03 DIAGNOSIS — M25552 Pain in left hip: Secondary | ICD-10-CM

## 2024-01-09 ENCOUNTER — Ambulatory Visit: Payer: Self-pay

## 2024-01-09 ENCOUNTER — Ambulatory Visit (INDEPENDENT_AMBULATORY_CARE_PROVIDER_SITE_OTHER): Admitting: Family Medicine

## 2024-01-09 ENCOUNTER — Encounter: Payer: Self-pay | Admitting: Family Medicine

## 2024-01-09 VITALS — BP 106/72 | HR 82 | Temp 98.1°F | Wt 231.3 lb

## 2024-01-09 DIAGNOSIS — H6692 Otitis media, unspecified, left ear: Secondary | ICD-10-CM

## 2024-01-09 MED ORDER — DOXYCYCLINE HYCLATE 100 MG PO CAPS: 100.0000 mg | ORAL_CAPSULE | Freq: Two times a day (BID) | ORAL | 0 refills | Status: DC

## 2024-01-09 NOTE — Telephone Encounter (Signed)
 Patient disconnected upon transfer to NT. This RN made first outbound attempt. Patient answered, stated she was in a meeting and requested a call back at 9 AM. Routing for call back.   Copied from CRM #[id number]. Topic: Clinical - Red Word Triage >> Jan 09, 2024  8:21 AM Tonya Suarez ORN wrote: Red Word that prompted transfer to Nurse Triage: came in last week for strep and antibiotics, still having conjestion in her chest and shortness of breath

## 2024-01-09 NOTE — Telephone Encounter (Signed)
 FYI Only or Action Required?: FYI only for provider: appointment scheduled on 01/09/24.  Patient was last seen in primary care on 01/01/2024 by Allwardt, Mardy HERO, PA-C.  Called Nurse Triage reporting Nasal Congestion, Wheezing, and Cough.  Symptoms began a week ago.  Interventions attempted: OTC medications: Mucinex, Prescription medications: Z-pak and prednisone , and Other: OV on 01/01/24.  Symptoms are: unchanged.  Triage Disposition: See HCP Within 4 Hours (Or PCP Triage)  Patient/caregiver understands and will follow disposition?: Yes                             Reason for Disposition  [1] MILD difficulty breathing (e.g., minimal/no SOB at rest, SOB with walking, pulse < 100) AND [2] NEW-onset or WORSE than normal  Answer Assessment - Initial Assessment Questions 1. RESPIRATORY STATUS: Describe your breathing? (e.g., wheezing, shortness of breath, unable to speak, severe coughing)      Mild SOB upon mainly upon exertion 2. ONSET: When did this breathing problem begin?      Last Sunday, seen for OV on 01/01/24 and respiratory symptoms haven't resolved 3. PATTERN Does the difficult breathing come and go, or has it been constant since it started?      Intermittent, comes upon exertion  4. SEVERITY: How bad is your breathing? (e.g., mild, moderate, severe)      Mild, patient able to speak in clear and complete sentences while on phone with this RN, no labored breathing detected 5. RECURRENT SYMPTOM: Have you had difficulty breathing before? If Yes, ask: When was the last time? and What happened that time?      Denies 6. CARDIAC HISTORY: Do you have any history of heart disease? (e.g., heart attack, angina, bypass surgery, angioplasty)      Denies 7. LUNG HISTORY: Do you have any history of lung disease?  (e.g., pulmonary embolus, asthma, emphysema)     Denies 8. CAUSE: What do you think is causing the breathing problem?      Treated  for strep throat on 01/01/24 and respiratory symptoms have not resolved 9. OTHER SYMPTOMS: Do you have any other symptoms? (e.g., chest pain, cough, dizziness, fever, runny nose)     Occasional cough, nasal congestion, occasional dizziness, wheezing, ear fullness, fatigue, headache, chest pain has improved  Unsure about fever 10. O2 SATURATION MONITOR:  Do you use an oxygen saturation monitor (pulse oximeter) at home? If Yes, ask: What is your reading (oxygen level) today? What is your usual oxygen saturation reading? (e.g., 95%)     Denies 11. PREGNANCY: Is there any chance you are pregnant? When was your last menstrual period?     N/A 12. TRAVEL: Have you traveled out of the country in the last month? (e.g., travel history, exposures)     N/A  Protocols used: Breathing Difficulty-A-AH

## 2024-01-09 NOTE — Progress Notes (Signed)
 Established Patient Office Visit  Subjective   Patient ID: Tonya Suarez, female    DOB: 03-18-1970  Age: 53 y.o. MRN: [id number]  Chief Complaint  Patient presents with   Nasal Congestion   Cough   Headache   Wheezing    HPI   Tonya Suarez is seen today as a work in.  She states that a week ago Sunday she developed some sore throat and nasal congestion.  That was followed by some cough and headache.  She went into her regular clinic a week ago yesterday on Monday and had testing for COVID, influenza, and strep.  Strep screen came back positive.  She was placed on Zithromax , albuterol , and prednisone  40 mg daily for 5 days.  She feels like she has had some wheezing intermittently.  No fever.  Sore throat symptoms have improved.  Still has some left earache.  Has been taking over-the-counter Advil  and Mucinex.  Past Medical History:  Diagnosis Date   Abnormal Pap smear of cervix    ascus hpv hr neg   Allergy    Arthritis    Chronic headaches    Depression    Diabetes mellitus without complication (HCC)    GERD (gastroesophageal reflux disease)    Hypertension    Low back pain    UTI (urinary tract infection)    Past Surgical History:  Procedure Laterality Date   CERVICAL BIOPSY  W/ LOOP ELECTRODE EXCISION     COLPOSCOPY     CIN2/3   SHOULDER ARTHROSCOPY WITH DISTAL CLAVICLE RESECTION Right 06/12/2023   Procedure: RIGHT SHOULDER ARTHROSCOPY WITH DISTAL CLAVICLE RESECTION, SUBPECTORIAL TENODESIS;  Surgeon: Genelle Standing, MD;  Location: [facility];  Service: Orthopedics;  Laterality: Right;   SUBACROMIAL DECOMPRESSION Right 06/12/2023   Procedure: RIGHT SUBACROMIAL DECOMPRESSION;  Surgeon: Genelle Standing, MD;  Location: [facility];  Service: Orthopedics;  Laterality: Right;   TUBAL LIGATION      reports that she has quit smoking. Her smoking use included cigars. She has never used smokeless tobacco. She reports current alcohol use. She reports  that she does not use drugs. family history includes Cancer - Other in her sister; Colon cancer in her father, maternal aunt, and paternal grandmother; Colon polyps in her father and mother; Deep vein thrombosis in her father; Diabetes in her father and mother; Hyperlipidemia in her father and mother; Hypertension in her father and mother; Kidney disease in her mother; Lung cancer in her father; Stomach cancer in her sister. Allergies  Allergen Reactions   Amoxicillin Swelling    Lip swelling   Nsaids     bleeding    Review of Systems  Constitutional:  Negative for chills and fever.  HENT:  Positive for congestion and ear pain.   Respiratory:  Positive for cough.       Objective:     BP 106/72   Pulse 82   Temp 98.1 F (36.7 C) (Oral)   Wt 231 lb 4.8 oz (104.9 kg)   LMP 06/05/2018 Comment: BTL  SpO2 96%   BMI 33.19 kg/m  BP Readings from Last 3 Encounters:  01/09/24 106/72  01/01/24 122/84  06/12/23 99/64   Wt Readings from Last 3 Encounters:  01/09/24 231 lb 4.8 oz (104.9 kg)  01/01/24 225 lb 12.8 oz (102.4 kg)  08/02/23 179 lb (81.2 kg)      Physical Exam Vitals reviewed.  Constitutional:      General: She is not in acute distress.  Appearance: She is not ill-appearing.  HENT:     Right Ear: Tympanic membrane normal.     Left Ear: A middle ear effusion is present. Tympanic membrane is injected and erythematous. Tympanic membrane is not perforated.     Mouth/Throat:     Mouth: Mucous membranes are moist.     Pharynx: Oropharynx is clear.  Cardiovascular:     Rate and Rhythm: Normal rate and regular rhythm.  Pulmonary:     Effort: Pulmonary effort is normal.     Breath sounds: Normal breath sounds. No wheezing or rales.  Musculoskeletal:     Cervical back: Neck supple.  Neurological:     Mental Status: She is alert.      No results found for any visits on 01/09/24.    The ASCVD Risk score (Arnett DK, et al., 2019) failed to calculate for the  following reasons:   The valid total cholesterol range is 130 to 320 mg/dL    Assessment & Plan:   Tonya Suarez is seen with onset of recent respiratory infection with cough, congestion, left earache.  Chest exam is clear.  She just finished course of Zithromax  but has some persistent erythema and retraction of the left eardrum.  Right appears normal.  She does have some effusion involving the left as well.  She has history of allergy to penicillin.  We decided when cover with doxycycline  100 mg twice daily for 7 more days.  Continue over-the-counter Mucinex and Advil  or Tylenol  as needed.  Follow-up promptly for any fever or other new symptoms.   Wolm Scarlet, MD

## 2024-01-09 NOTE — Telephone Encounter (Signed)
 Patient scheduled with alternate office today. Dr. Katrinka will review triage notes.

## 2024-01-24 ENCOUNTER — Other Ambulatory Visit: Payer: Self-pay | Admitting: Family Medicine

## 2024-01-24 NOTE — Telephone Encounter (Signed)
 Last OV 03/21/23 Next OV not scheduled  Last refill 07/05/23 [facility] #30/2

## 2024-02-13 ENCOUNTER — Telehealth: Payer: Self-pay | Admitting: Pharmacy Technician

## 2024-02-13 ENCOUNTER — Other Ambulatory Visit (HOSPITAL_COMMUNITY): Payer: Self-pay

## 2024-02-13 NOTE — Telephone Encounter (Signed)
 Pharmacy Patient Advocate Encounter   Received notification from Onbase that prior authorization for Ozempic  (0.25 or 0.5 MG/DOSE) 2MG /3ML pen-injectors  is due for renewal.   Insurance verification completed.   The patient is insured through [facility].  Action: PA required and submitted KEY/EOC/Request #: [id number] CANCELLED due to No PA needed.  **The insurance went ahead and approved it through 02/12/25 without sending a full PA.**

## 2024-02-23 ENCOUNTER — Telehealth: Payer: Self-pay | Admitting: Family Medicine

## 2024-02-23 NOTE — Telephone Encounter (Signed)
 Please call patient to schedule an appointment.   Copied from CRM [id number]. Topic: Clinical - Request for Lab/Test Order >> Feb 23, 2024  1:53 PM Tonya Suarez wrote: Reason for CRM: Patient would like to have A1C blood work done

## 2024-02-25 ENCOUNTER — Other Ambulatory Visit: Payer: Self-pay | Admitting: Family Medicine

## 2024-02-25 DIAGNOSIS — I10 Essential (primary) hypertension: Secondary | ICD-10-CM

## 2024-02-26 ENCOUNTER — Ambulatory Visit: Admitting: Family Medicine

## 2024-02-26 ENCOUNTER — Encounter: Payer: Self-pay | Admitting: Family Medicine

## 2024-02-26 ENCOUNTER — Ambulatory Visit: Payer: Self-pay | Admitting: Family Medicine

## 2024-02-26 VITALS — BP 128/82 | HR 76 | Temp 97.1°F | Ht 70.0 in | Wt 233.4 lb

## 2024-02-26 DIAGNOSIS — E785 Hyperlipidemia, unspecified: Secondary | ICD-10-CM | POA: Diagnosis not present

## 2024-02-26 DIAGNOSIS — E538 Deficiency of other specified B group vitamins: Secondary | ICD-10-CM

## 2024-02-26 DIAGNOSIS — I10 Essential (primary) hypertension: Secondary | ICD-10-CM

## 2024-02-26 DIAGNOSIS — Z0000 Encounter for general adult medical examination without abnormal findings: Secondary | ICD-10-CM

## 2024-02-26 DIAGNOSIS — E1169 Type 2 diabetes mellitus with other specified complication: Secondary | ICD-10-CM | POA: Diagnosis not present

## 2024-02-26 DIAGNOSIS — Z124 Encounter for screening for malignant neoplasm of cervix: Secondary | ICD-10-CM

## 2024-02-26 DIAGNOSIS — E119 Type 2 diabetes mellitus without complications: Secondary | ICD-10-CM

## 2024-02-26 LAB — CBC WITH DIFFERENTIAL/PLATELET
Basophils Absolute: 0 K/uL (ref 0.0–0.1)
Basophils Relative: 0.4 % (ref 0.0–3.0)
Eosinophils Absolute: 0.3 K/uL (ref 0.0–0.7)
Eosinophils Relative: 3.4 % (ref 0.0–5.0)
HCT: 43.3 % (ref 36.0–46.0)
Hemoglobin: 15.3 g/dL — ABNORMAL HIGH (ref 12.0–15.0)
Lymphocytes Relative: 42.7 % (ref 12.0–46.0)
Lymphs Abs: 3.4 K/uL (ref 0.7–4.0)
MCHC: 35.4 g/dL (ref 30.0–36.0)
MCV: 88.3 fl (ref 78.0–100.0)
Monocytes Absolute: 0.4 K/uL (ref 0.1–1.0)
Monocytes Relative: 4.8 % (ref 3.0–12.0)
Neutro Abs: 3.9 K/uL (ref 1.4–7.7)
Neutrophils Relative %: 48.7 % (ref 43.0–77.0)
Platelets: 212 K/uL (ref 150.0–400.0)
RBC: 4.9 Mil/uL (ref 3.87–5.11)
RDW: 14.4 % (ref 11.5–15.5)
WBC: 8 K/uL (ref 4.0–10.5)

## 2024-02-26 LAB — URINALYSIS, ROUTINE W REFLEX MICROSCOPIC
Hgb urine dipstick: NEGATIVE
Ketones, ur: NEGATIVE
Leukocytes,Ua: NEGATIVE
Nitrite: NEGATIVE
RBC / HPF: NONE SEEN
Specific Gravity, Urine: >=1.03 — AB (ref 1.000–1.030)
Urine Glucose: NEGATIVE
Urobilinogen, UA: 0.2 (ref 0.0–1.0)
pH: 6 (ref 5.0–8.0)

## 2024-02-26 LAB — COMPREHENSIVE METABOLIC PANEL WITH GFR
ALT: 19 U/L (ref 3–35)
AST: 19 U/L (ref 5–37)
Albumin: 4.2 g/dL (ref 3.5–5.2)
Alkaline Phosphatase: 67 U/L (ref 39–117)
BUN: 15 mg/dL (ref 6–23)
CO2: 27 meq/L (ref 19–32)
Calcium: 9.3 mg/dL (ref 8.4–10.5)
Chloride: 106 meq/L (ref 96–112)
Creatinine, Ser: 0.73 mg/dL (ref 0.40–1.20)
GFR: 92.53 mL/min
Glucose, Bld: 107 mg/dL — ABNORMAL HIGH (ref 70–99)
Potassium: 3.8 meq/L (ref 3.5–5.1)
Sodium: 140 meq/L (ref 135–145)
Total Bilirubin: 0.7 mg/dL (ref 0.2–1.2)
Total Protein: 6.8 g/dL (ref 6.0–8.3)

## 2024-02-26 LAB — LIPID PANEL
Cholesterol: 132 mg/dL (ref 28–200)
HDL: 43.2 mg/dL
LDL Cholesterol: 71 mg/dL (ref 10–99)
NonHDL: 88.7
Total CHOL/HDL Ratio: 3
Triglycerides: 89 mg/dL (ref 10.0–149.0)
VLDL: 17.8 mg/dL (ref 0.0–40.0)

## 2024-02-26 LAB — HEMOGLOBIN A1C: Hgb A1c MFr Bld: 4.5 % — ABNORMAL LOW (ref 4.6–6.5)

## 2024-02-26 LAB — MICROALBUMIN / CREATININE URINE RATIO
Creatinine,U: 234.3 mg/dL
Microalb Creat Ratio: 3.3 mg/g (ref 0.0–30.0)
Microalb, Ur: 0.8 mg/dL (ref 0.7–1.9)

## 2024-02-26 MED ORDER — SEMAGLUTIDE (1 MG/DOSE) 4 MG/3ML ~~LOC~~ SOPN: 1.0000 mg | PEN_INJECTOR | SUBCUTANEOUS | 11 refills | Status: DC

## 2024-02-26 MED ORDER — TIZANIDINE HCL 4 MG PO TABS: 4.0000 mg | ORAL_TABLET | Freq: Three times a day (TID) | ORAL | 2 refills | Status: AC | PRN

## 2024-02-26 NOTE — Progress Notes (Signed)
 " Phone [phone]   Subjective:  Patient presents today for their annual physical. Chief complaint-noted.   See problem oriented charting- ROS- full  review of systems was completed and negative except for topics noted under acute/chronic concerns   The following were reviewed and entered/updated in epic: Past Medical History:  Diagnosis Date   Abnormal Pap smear of cervix    ascus hpv hr neg   Allergy    Arthritis    Chronic headaches    Depression    Diabetes mellitus without complication (HCC)    GERD (gastroesophageal reflux disease)    Hypertension    Low back pain    UTI (urinary tract infection)    Patient Active Problem List   Diagnosis Date Noted   Controlled diabetes mellitus (HCC) 05/13/2021    Priority: High   Former smoker 07/19/2018    Priority: High   Hyperlipidemia associated with type 2 diabetes mellitus (HCC) 05/25/2021    Priority: Medium    Constipation 07/19/2018    Priority: Medium    History of adenomatous polyp of colon 09/23/2016    Priority: Medium    Migraines 05/06/2016    Priority: Medium    HTN (hypertension) 11/21/2011    Priority: Medium    Depression 09/06/2006    Priority: Medium    Restless legs 07/19/2018    Priority: Low   Ingrown toenail 12/20/2016    Priority: Low   Family history of cancer of GI tract 12/06/2012    Priority: Low   MENORRHAGIA 09/23/2009    Priority: Low   PLANTAR FASCIITIS 09/20/2007    Priority: Low   Osteoarthritis 09/19/2006    Priority: Low   Allergic rhinitis 09/06/2006    Priority: Low   GERD 09/06/2006    Priority: Low   Impingement syndrome of right shoulder 06/12/2023   Arthritis of right acromioclavicular joint 06/12/2023   Biceps tendinitis of right upper extremity 06/12/2023   Recurrent major depressive disorder, in full remission 05/11/2021   Bilateral sciatica 12/08/2020   Mass of thigh, left 08/26/2020   Pain in right hand 09/27/2018   B12 deficiency 07/19/2018   Past  Surgical History:  Procedure Laterality Date   CERVICAL BIOPSY  W/ LOOP ELECTRODE EXCISION     COLPOSCOPY     CIN2/3   SHOULDER ARTHROSCOPY WITH DISTAL CLAVICLE RESECTION Right 06/12/2023   Procedure: RIGHT SHOULDER ARTHROSCOPY WITH DISTAL CLAVICLE RESECTION, SUBPECTORIAL TENODESIS;  Surgeon: Genelle Standing, MD;  Location: [facility];  Service: Orthopedics;  Laterality: Right;   SUBACROMIAL DECOMPRESSION Right 06/12/2023   Procedure: RIGHT SUBACROMIAL DECOMPRESSION;  Surgeon: Genelle Standing, MD;  Location: [facility];  Service: Orthopedics;  Laterality: Right;   TUBAL LIGATION      Family History  Problem Relation Age of Onset   Colon polyps Mother    Diabetes Mother    Hypertension Mother    Hyperlipidemia Mother    Kidney disease Mother        has fistula- could need dialysis at any time   Colon polyps Father    Diabetes Father        mother   Hyperlipidemia Father        mother   Hypertension Father        mother   Colon cancer Father        65s, and grandmother   Lung cancer Father        in 34s   Deep vein thrombosis Father  not during dtime of cancer. also paternal aunts reported   Cancer - Other Sister        duodenal   Stomach cancer Sister    Colon cancer Paternal Grandmother    Colon cancer Maternal Aunt    Esophageal cancer Neg Hx    Liver cancer Neg Hx    Pancreatic cancer Neg Hx    Rectal cancer Neg Hx     Medications- reviewed and updated Current Outpatient Medications  Medication Sig Dispense Refill   diclofenac  Sodium (VOLTAREN ) 1 % GEL Apply 4 g topically 4 (four) times daily. To affected joint. 100 g 11   gabapentin  (NEURONTIN ) 300 MG capsule TAKE 2 CAPSULES BY MOUTH AT BEDTIME 180 capsule 1   meloxicam  (MOBIC ) 7.5 MG tablet TAKE 1 TABLET (7.5 MG TOTAL) BY MOUTH DAILY AS NEEDED. FOR PAIN 90 tablet 1   pantoprazole  (PROTONIX ) 40 MG tablet TAKE 1 TABLET BY MOUTH EVERY DAY 90 tablet 3   rosuvastatin  (CRESTOR ) 10 MG  tablet TAKE 1 TABLET (10 MG TOTAL) BY MOUTH ONCE A WEEK. 13 tablet 3   Semaglutide , 1 MG/DOSE, 4 MG/3ML SOPN Inject 1 mg as directed once a week. 3 mL 11   amLODipine  (NORVASC ) 2.5 MG tablet TAKE 1 TABLET BY MOUTH EVERY DAY 90 tablet 3   hydrochlorothiazide  (HYDRODIURIL ) 25 MG tablet TAKE 1 TABLET (25 MG TOTAL) BY MOUTH DAILY. 90 tablet 3   tiZANidine  (ZANAFLEX ) 4 MG tablet Take 1 tablet (4 mg total) by mouth every 8 (eight) hours as needed for muscle spasms (Do not drive for 8 hours after use). 30 tablet 2   No current facility-administered medications for this visit.    Allergies-reviewed and updated Allergies[1]  Social History   Social History Narrative   Married May 13th 2017. 2 children- son 54 (married) and daughter 85. 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 Oropharynx normal Neck: no thyromegaly CV: RRR no murmurs rubs or gallops Lungs: CTAB no crackles, wheeze, rhonchi Abdomen: soft/nontender/nondistended/normal bowel sounds. No rebound or guarding.  Ext: no edema Skin: warm, dry Neuro: grossly normal, moves all extremities, PERRLA Musculoskeletal greater trochanter tender with palpation on the left side (concern for bursitis)   Assessment and Plan   54 y.o. female presenting for annual physical.  Health Maintenance counseling: 1. Anticipatory guidance: Patient counseled regarding regular dental exams -q6 months, eye exams - yearly,  avoiding smoking and second hand smoke-some secondhand smoke-husband stopped smoking! , limiting alcohol to 1  beverage per day-once a week or less , no illicit drugs .   2. Risk factor reduction:  Advised patient of need for regular exercise and diet rich and fruits and vegetables to reduce risk of heart attack and stroke.  Exercise- has been hard supporting mom as caregiver after surgery- hoping to get back to this.  Diet/weight management-weight was 223 at last visit with me in March and has increased from that visit-on the other hand  within a few pounds of September 2024 weight- shed like to try the 1 mg dose - just cautioned about low sugars and letting me know Wt Readings from Last 3 Encounters:  02/26/24 233 lb 6.4 oz (105.9 kg)  01/09/24 231 lb 4.8 oz (104.9 kg)  01/01/24 225 lb 12.8 oz (102.4 kg)  3. Immunizations/screenings/ancillary studies-Prevnar 20 discussed- considering next year.  Team requesting eye exam  Immunization History  Administered Date(s) Administered   Influenza Inj Mdck Quad Pf 12/31/2019   Influenza Inj Mdck Quad With Preservative 11/28/2017   Influenza Split 11/19/2007, 12/03/2008, 11/21/2011, 11/22/2018   Influenza Whole 11/19/2007, 12/03/2008, 11/22/2018   Influenza, Seasonal, Injecte, Preservative Fre 11/11/2022   Influenza,inj,Quad PF,6+ Mos 11/21/2012, 12/24/2015, 11/03/2020, 12/09/2021   Influenza,inj,quad, With Preservative 12/18/2017   Influenza-Unspecified 12/17/2014, 12/04/2019   PFIZER(Purple Top)SARS-COV-2 Vaccination 05/30/2019, 06/26/2019, 02/19/2020   Td 02/21/2001   Tdap 11/21/2011, 11/11/2022   Zoster Recombinant(Shingrix) 03/03/2020, 09/30/2020   4. Cervical cancer screening- typically recommended yearly Pap smear with history of LEEP in 2023-encourage patient to call to schedule if doesn't hear within a week- new referral back to [facility] with prior doctor no longer here 5. Breast cancer screening-  breast exam with GYN planned and mammogram 09/13/2023 6. Colon cancer screening - 03/30/2022 with 5-year repeat with Dr.  Abran Due to history of adenomatous  polyp.  History of low sugar after colonoscopy so would take her off of Ozempic  7. Skin cancer screening- lower risk due to melanin content. advised regular sunscreen use. Denies worrisome, changing, or new skin lesions.  8. Birth control/STD check- tubal ligation/monogamous 9. Osteoporosis screening at 17- we will plan on this if not done by GYN 10. Smoking associated screening -former smoker of Black and milds perhaps 1-1-1/2 a day in the past.  Low threshold for lung cancer screening but will get urinalysis again  Status of chronic or acute concerns   #strep throat in November- healed well but also later had otitis media in that month   # Some orthopedic issues in the last year and has seen Dr. Alvira and as needed particular for her right shoulder and right bicep tendon-had previously seen Dr. Joane  - right shoulder surgery last April- still bothers her some.. still doing home exercises.   # Some podiatry concerns and has seen Dr. Magdalen in the last year-ingrown toenail primarily-as well as paronychia. Doing ok lately  # Diabetes-new diagnosis with A1c of 9.2 on 05/12/2021 S: Medication:Ozempic  0.5 mg -Trial Ozempic  with low sugars- later restarted -Did not tolerate metformin  due to GI side effects though A1c substantially improved Lab Results  Component Value Date   HGBA1C 4.4 (L) 05/18/2023   HGBA1C 4.9 11/11/2022   HGBA1C 4.5 (L) 04/06/2022   A/P: has done really well lately- hopefully stable- update a1c, UACR today. Continue current meds for now    #Hypertension S: Compliant with amlodipine  2.5 mg and hydrochlorothiazide  25 mg  BP Readings from Last 3 Encounters:  02/26/24 128/82  01/09/24 106/72  01/01/24 122/84  A/P: well controlled continue current medications    #hyperlipidemia S: Medication:rosuvastatin  weekly  Lab Results  Component Value Date   CHOL 118 11/11/2022   HDL 44 (L) 11/11/2022   LDLCALC 60 11/11/2022   TRIG 60 11/11/2022   CHOLHDL 2.7 11/11/2022   A/P:  has worked well-continue current medications    # Migraines with aura S: primarily uses [employer] migraine tablets. fiorcet in past. There can be periods where she needs nightly-warned of risks  of rebound A/P: occasional flares but tolerable .   #Low back pain issues-history of bulging disc L5-S1 with lumbar radiculopathy-followed by Dr. Joane. Some issues down into the leg- Still has gabapentin , meloxicam  and uses tizanidine  as muscle relaxant. Uses meloxicam  if things flare up- so for instance last 2 weeks but not always using. In particular right hip bothering her.  -she's not resting as well even with the gabapentin  lately- right hip plus pain down into legs and tingling. Only one of gabapentin .  -she's going to try gabapentin  2 at a time at night to see if that helps. We can refill if needed. We considered physical therapy but very costly $100 a session so we may go straight to Dr. Joane if not improving or Dr. Genelle (has had shot before)  #Osteoarthritis- lumbar spine, knees, shoulders.  Meloxicam  helpful in the past as would like to avoid if possible due to hypertension- but see above   #work form for cpe - she will send to us   #varicose veins- using compression stockings  Recommended follow up: Return in about 6 months (around 08/25/2024) for followup or sooner if needed.Schedule b4 you leave.  Lab/Order associations: fasting   ICD-10-CM   1. Preventative health care  Z00.00     2. B12 deficiency  E53.8     3. Hypertension, unspecified type  I10 Urinalysis, Routine w reflex microscopic    4. Hyperlipidemia associated with type 2 diabetes mellitus (HCC)  E11.69    E78.5     5. Controlled type 2 diabetes mellitus without complication, without long-term current use of insulin (HCC)  E11.9 Hemoglobin A1c    Microalbumin / creatinine urine ratio    Lipid panel    CBC with Differential/Platelet    Comprehensive metabolic panel    6. Screening for cervical cancer  Z12.4  Ambulatory referral to Gynecology      Meds ordered this encounter  Medications   tiZANidine  (ZANAFLEX ) 4 MG tablet    Sig: Take 1 tablet (4 mg total) by mouth every 8 (eight) hours as needed for muscle spasms (Do not drive for 8 hours after use).    Dispense:  30 tablet    Refill:  2   Semaglutide , 1 MG/DOSE, 4 MG/3ML SOPN    Sig: Inject 1 mg as directed once a week.    Dispense:  3 mL    Refill:  11    Return precautions advised.  Garnette Lukes, MD      [1]  Allergies Allergen Reactions   Amoxicillin Swelling    Lip swelling   Nsaids     bleeding   "

## 2024-02-26 NOTE — Patient Instructions (Addendum)
-  she's going to try gabapentin  2 at a time at night to see if that helps. We can refill if needed. We considered physical therapy but very costly $100 a session so we may go straight to Dr. Joane if not improving or Dr. Genelle (has had shot before) - greater trochanter tender with palpation on the left side (concern for bursitis)- this may be helped by meloxicam  but may need another shot unfortunately  Team please request from [facility]- she has had in the last year  shed like to try the 1 mg dose of ozempic  - just cautioned about low sugars and letting me know  We have placed a referral for you today to [facility]- please call their # if you do not hear within a week (may be listed below or you may see mychart message within a few days with #).   Recommended follow up: Return in about 6 months (around 08/25/2024) for followup or sooner if needed.Schedule b4 you leave.

## 2024-03-16 ENCOUNTER — Other Ambulatory Visit: Payer: Self-pay | Admitting: Family Medicine

## 2024-03-20 ENCOUNTER — Ambulatory Visit (HOSPITAL_BASED_OUTPATIENT_CLINIC_OR_DEPARTMENT_OTHER): Admitting: Orthopaedic Surgery

## 2024-03-20 DIAGNOSIS — M25552 Pain in left hip: Secondary | ICD-10-CM

## 2024-03-20 DIAGNOSIS — M25551 Pain in right hip: Secondary | ICD-10-CM

## 2024-03-20 MED ORDER — LIDOCAINE HCL 1 % IJ SOLN
4.0000 mL | INTRAMUSCULAR | Status: AC | PRN
  Administered 2024-03-20: 4 mL

## 2024-03-20 MED ORDER — TRIAMCINOLONE ACETONIDE 40 MG/ML IJ SUSP
80.0000 mg | INTRAMUSCULAR | Status: AC | PRN
  Administered 2024-03-20: 80 mg via INTRA_ARTICULAR

## 2024-03-20 NOTE — Progress Notes (Signed)
 "                                Post Operative Evaluation    Procedure/Date of Surgery: Right shoulder arthroscopy with distal clavicle resection and subacromial decompression  Interval History:   Presents today status post above procedure.  At this time she is here for MRI discussion of both hips. PMH/PSH/Family History/Social History/Meds/Allergies:    Past Medical History:  Diagnosis Date   Abnormal Pap smear of cervix    ascus hpv hr neg   Allergy    Arthritis    Chronic headaches    Depression    Diabetes mellitus without complication (HCC)    GERD (gastroesophageal reflux disease)    Hypertension    Low back pain    UTI (urinary tract infection)    Past Surgical History:  Procedure Laterality Date   CERVICAL BIOPSY  W/ LOOP ELECTRODE EXCISION     COLPOSCOPY     CIN2/3   SHOULDER ARTHROSCOPY WITH DISTAL CLAVICLE RESECTION Right 06/12/2023   Procedure: RIGHT SHOULDER ARTHROSCOPY WITH DISTAL CLAVICLE RESECTION, SUBPECTORIAL TENODESIS;  Surgeon: Genelle Standing, MD;  Location: [facility];  Service: Orthopedics;  Laterality: Right;   SUBACROMIAL DECOMPRESSION Right 06/12/2023   Procedure: RIGHT SUBACROMIAL DECOMPRESSION;  Surgeon: Genelle Standing, MD;  Location: [facility];  Service: Orthopedics;  Laterality: Right;   TUBAL LIGATION     Social History   Socioeconomic History   Marital status: Married    Spouse name: Not on file   Number of children: Not on file   Years of education: Not on file   Highest education level: Not on file  Occupational History   Not on file  Tobacco Use   Smoking status: Former    Types: Cigars   Smokeless tobacco: Never  Vaping Use   Vaping status: Never Used  Substance and Sexual Activity   Alcohol use: Yes    Alcohol/week: 0.0 - 2.0 standard drinks of alcohol    Comment: occ   Drug use: No   Sexual activity: Yes    Partners: Male    Birth control/protection: Surgical,  Post-menopausal    Comment: tubal ligation  Other Topics Concern   Not on file  Social History Narrative   Married May 13th 2017. 2 children- son 27 (married) and daughter 31. 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      Hobbies: time with family   Social Drivers of Health   Tobacco Use: Medium Risk (02/26/2024)   Patient History    Smoking Tobacco Use: Former    Smokeless Tobacco Use: Never    Passive Exposure: Not on Actuary Strain: Not on file  Food Insecurity: Not on file  Transportation Needs: Not on file  Physical Activity: Not on file  Stress: Not on file  Social Connections: Unknown (08/03/2021)   [facility]   Social Network    Social Network: Not on file  Depression (PHQ2-9): Low Risk (01/01/2024)   Depression (PHQ2-9)    PHQ-2 Score: 2  Alcohol Screen: Not on file  Housing: Not on file  Utilities: Not on file  Health Literacy: Not on file   Family History  Problem Relation Age of Onset   Colon polyps Mother    Diabetes Mother    Hypertension  Mother    Hyperlipidemia Mother    Kidney disease Mother        has fistula- could need dialysis at any time   Colon polyps Father    Diabetes Father        mother   Hyperlipidemia Father        mother   Hypertension Father        mother   Colon cancer Father        71s, and grandmother   Lung cancer Father        in 17s   Deep vein thrombosis Father        not during dtime of cancer. also paternal aunts reported   Cancer - Other Sister        duodenal   Stomach cancer Sister    Colon cancer Paternal Grandmother    Colon cancer Maternal Aunt    Esophageal cancer Neg Hx    Liver cancer Neg Hx    Pancreatic cancer Neg Hx    Rectal cancer Neg Hx    Allergies  Allergen Reactions   Amoxicillin Swelling    Lip swelling   Nsaids     bleeding   Current Outpatient Medications   Medication Sig Dispense Refill   amLODipine  (NORVASC ) 2.5 MG tablet TAKE 1 TABLET BY MOUTH EVERY DAY 90 tablet 3   diclofenac  Sodium (VOLTAREN ) 1 % GEL Apply 4 g topically 4 (four) times daily. To affected joint. 100 g 11   gabapentin  (NEURONTIN ) 300 MG capsule TAKE 2 CAPSULES BY MOUTH AT BEDTIME 180 capsule 1   hydrochlorothiazide  (HYDRODIURIL ) 25 MG tablet TAKE 1 TABLET (25 MG TOTAL) BY MOUTH DAILY. 90 tablet 3   meloxicam  (MOBIC ) 7.5 MG tablet TAKE 1 TABLET (7.5 MG TOTAL) BY MOUTH DAILY AS NEEDED. FOR PAIN 90 tablet 1   pantoprazole  (PROTONIX ) 40 MG tablet TAKE 1 TABLET BY MOUTH EVERY DAY 90 tablet 3   rosuvastatin  (CRESTOR ) 10 MG tablet TAKE 1 TABLET (10 MG TOTAL) BY MOUTH ONCE A WEEK. 13 tablet 3   Semaglutide , 1 MG/DOSE, 4 MG/3ML SOPN Inject 1 mg as directed once a week. 3 mL 11   tiZANidine  (ZANAFLEX ) 4 MG tablet Take 1 tablet (4 mg total) by mouth every 8 (eight) hours as needed for muscle spasms (Do not drive for 8 hours after use). 30 tablet 2   No current facility-administered medications for this visit.   No results found.  Review of Systems:   A ROS was performed including pertinent positives and negatives as documented in the HPI.   Musculoskeletal Exam:    Right shoulder incisions are well-appearing without erythema or drainage.  There is a small rash about the anterior aspect of the elbow.  In the spine position she can forward elevate to 90 degrees actively with external rotation at side to 30.  Distal neurosensory exam is intact   Left hip with a positive FADIR with tenderness about the femoral acetabular joint.  30 degrees internal rotation and 90 degrees of flexion with pain 45 degrees external rotation without pain.  Good abduction strength  Imaging:      I personally reviewed and interpreted the radiographs.   Assessment:    status post right shoulder distal clavicle resection subacromial decompression overall doing well.  Range of motion is  improving nicely after injection.  With regard to the left hip she does have evidence of labral tearing which is consistent as well with her right hip MRI performed in  2022.  At this time she is elected for bilateral hip ultrasound-guided injections which were provided after verbal consent was obtained  Plan :    - Bilateral hip injections provided after verbal consent obtained       Procedure Note  Patient: Tonya Suarez             Date of Birth: [birth year]           MRN: [id number]             Visit Date: 03/20/2024  Procedures: Visit Diagnoses: No diagnosis found.  Large Joint Inj: R hip joint on 03/20/2024 5:32 PM Indications: pain Details: 22 G 3.5 in needle, ultrasound-guided anterolateral approach  Arthrogram: No  Medications: 4 mL lidocaine  1 %; 80 mg triamcinolone  acetonide 40 MG/ML Outcome: tolerated well, no immediate complications Procedure, treatment alternatives, risks and benefits explained, specific risks discussed. Consent was given by the patient. Immediately prior to procedure a time out was called to verify the correct patient, procedure, equipment, support staff and site/side marked as required. Patient was prepped and draped in the usual sterile fashion.    Large Joint Inj: L hip joint on 03/20/2024 5:32 PM Indications: pain Details: 22 G 3.5 in needle, ultrasound-guided anterolateral approach  Arthrogram: No  Medications: 4 mL lidocaine  1 %; 80 mg triamcinolone  acetonide 40 MG/ML Outcome: tolerated well, no immediate complications Procedure, treatment alternatives, risks and benefits explained, specific risks discussed. Consent was given by the patient. Immediately prior to procedure a time out was called to verify the correct patient, procedure, equipment, support staff and site/side marked as required. Patient was prepped and draped in the usual sterile fashion.            I personally saw and evaluated the patient, and participated in  the management and treatment plan.  Elspeth Parker, MD Attending Physician, Orthopedic Surgery  This document was dictated using Dragon voice recognition software. A reasonable attempt at proof reading has been made to minimize errors. "

## 2024-03-27 MED ORDER — TIRZEPATIDE 2.5 MG/0.5ML ~~LOC~~ SOAJ: 2.5000 mg | SUBCUTANEOUS | 5 refills | Status: AC

## 2024-03-27 NOTE — Telephone Encounter (Signed)
 She is willing to try mounjaro . Which dose would you like me to send

## 2024-06-21 ENCOUNTER — Encounter: Admitting: Family Medicine

## 2024-09-20 ENCOUNTER — Ambulatory Visit: Admitting: Family Medicine

## 2025-02-26 ENCOUNTER — Encounter: Admitting: Family Medicine
